# Patient Record
Sex: Female | Born: 1955
Health system: Southern US, Community
[De-identification: ages and names within clinical notes are randomized; demographics above are authoritative.]

## PROBLEM LIST (undated history)

## (undated) DIAGNOSIS — M1990 Unspecified osteoarthritis, unspecified site: Secondary | ICD-10-CM

## (undated) DIAGNOSIS — E785 Hyperlipidemia, unspecified: Secondary | ICD-10-CM

## (undated) DIAGNOSIS — I10 Essential (primary) hypertension: Secondary | ICD-10-CM

## (undated) DIAGNOSIS — I517 Cardiomegaly: Secondary | ICD-10-CM

## (undated) DIAGNOSIS — I5032 Chronic diastolic (congestive) heart failure: Secondary | ICD-10-CM

## (undated) DIAGNOSIS — J189 Pneumonia, unspecified organism: Secondary | ICD-10-CM

## (undated) DIAGNOSIS — E669 Obesity, unspecified: Secondary | ICD-10-CM

## (undated) DIAGNOSIS — D649 Anemia, unspecified: Secondary | ICD-10-CM

## (undated) DIAGNOSIS — R0600 Dyspnea, unspecified: Secondary | ICD-10-CM

## (undated) DIAGNOSIS — R002 Palpitations: Secondary | ICD-10-CM

## (undated) DIAGNOSIS — Z9289 Personal history of other medical treatment: Secondary | ICD-10-CM

## (undated) DIAGNOSIS — H8110 Benign paroxysmal vertigo, unspecified ear: Secondary | ICD-10-CM

## (undated) DIAGNOSIS — N183 Chronic kidney disease, stage 3 (moderate): Secondary | ICD-10-CM

## (undated) DIAGNOSIS — N186 End stage renal disease: Secondary | ICD-10-CM

## (undated) HISTORY — PX: ABDOMINAL HYSTERECTOMY: SHX81

## (undated) HISTORY — PX: TRIGGER FINGER RELEASE: SHX641

## (undated) HISTORY — DX: Palpitations: R00.2

## (undated) HISTORY — PX: TUBAL LIGATION: SHX77

## (undated) HISTORY — DX: Benign paroxysmal vertigo, unspecified ear: H81.10

## (undated) HISTORY — DX: Hyperlipidemia, unspecified: E78.5

## (undated) HISTORY — PX: DILATION AND CURETTAGE OF UTERUS: SHX78

## (undated) HISTORY — PX: EYE SURGERY: SHX253

## (undated) HISTORY — PX: KNEE SURGERY: SHX244

---

## 1991-10-08 DIAGNOSIS — E119 Type 2 diabetes mellitus without complications: Secondary | ICD-10-CM | POA: Insufficient documentation

## 1991-10-08 DIAGNOSIS — Z794 Long term (current) use of insulin: Secondary | ICD-10-CM

## 2000-10-28 ENCOUNTER — Encounter: Payer: Self-pay | Admitting: Family Medicine

## 2000-10-28 ENCOUNTER — Ambulatory Visit (HOSPITAL_COMMUNITY): Admission: RE | Admit: 2000-10-28 | Discharge: 2000-10-28 | Payer: Self-pay | Admitting: Family Medicine

## 2003-12-04 ENCOUNTER — Emergency Department (HOSPITAL_COMMUNITY): Admission: EM | Admit: 2003-12-04 | Discharge: 2003-12-04 | Payer: Self-pay | Admitting: Emergency Medicine

## 2003-12-29 ENCOUNTER — Other Ambulatory Visit: Admission: RE | Admit: 2003-12-29 | Discharge: 2003-12-29 | Payer: Self-pay | Admitting: Family Medicine

## 2005-01-03 ENCOUNTER — Ambulatory Visit (HOSPITAL_COMMUNITY): Admission: RE | Admit: 2005-01-03 | Discharge: 2005-01-03 | Payer: Self-pay | Admitting: Family Medicine

## 2005-03-06 ENCOUNTER — Ambulatory Visit (HOSPITAL_BASED_OUTPATIENT_CLINIC_OR_DEPARTMENT_OTHER): Admission: RE | Admit: 2005-03-06 | Discharge: 2005-03-06 | Payer: Self-pay | Admitting: Orthopedic Surgery

## 2005-03-06 ENCOUNTER — Ambulatory Visit (HOSPITAL_COMMUNITY): Admission: RE | Admit: 2005-03-06 | Discharge: 2005-03-06 | Payer: Self-pay | Admitting: Orthopedic Surgery

## 2006-05-26 ENCOUNTER — Other Ambulatory Visit: Admission: RE | Admit: 2006-05-26 | Discharge: 2006-05-26 | Payer: Self-pay | Admitting: Family Medicine

## 2008-07-05 ENCOUNTER — Emergency Department (HOSPITAL_COMMUNITY): Admission: EM | Admit: 2008-07-05 | Discharge: 2008-07-05 | Payer: Self-pay | Admitting: Emergency Medicine

## 2009-05-10 ENCOUNTER — Emergency Department (HOSPITAL_COMMUNITY): Admission: EM | Admit: 2009-05-10 | Discharge: 2009-05-10 | Payer: Self-pay | Admitting: Emergency Medicine

## 2009-05-11 ENCOUNTER — Emergency Department (HOSPITAL_COMMUNITY): Admission: EM | Admit: 2009-05-11 | Discharge: 2009-05-11 | Payer: Self-pay | Admitting: Emergency Medicine

## 2009-05-11 ENCOUNTER — Ambulatory Visit: Payer: Self-pay | Admitting: Vascular Surgery

## 2009-08-24 ENCOUNTER — Ambulatory Visit: Payer: Self-pay | Admitting: Family Medicine

## 2009-08-24 DIAGNOSIS — I10 Essential (primary) hypertension: Secondary | ICD-10-CM | POA: Insufficient documentation

## 2009-08-24 DIAGNOSIS — M25519 Pain in unspecified shoulder: Secondary | ICD-10-CM | POA: Insufficient documentation

## 2009-08-30 ENCOUNTER — Ambulatory Visit: Payer: Self-pay | Admitting: Family Medicine

## 2009-08-30 LAB — CONVERTED CEMR LAB
Cholesterol: 181 mg/dL (ref 0–200)
HDL: 41 mg/dL (ref >39)
Total CHOL/HDL Ratio: 4.4
VLDL: 16 mg/dL (ref 0–40)

## 2009-09-04 ENCOUNTER — Encounter (INDEPENDENT_AMBULATORY_CARE_PROVIDER_SITE_OTHER): Payer: Self-pay | Admitting: Family Medicine

## 2009-09-25 ENCOUNTER — Ambulatory Visit: Payer: Self-pay | Admitting: Family Medicine

## 2009-09-25 LAB — CONVERTED CEMR LAB
Alkaline Phosphatase: 93 units/L (ref 39–117)
BUN: 16 mg/dL (ref 6–23)
Basophils Absolute: 0 10*3/uL (ref 0.0–0.1)
Basophils Relative: 0 % (ref 0–1)
Blood Glucose, Fingerstick: 309
Creatinine, Ser: 0.81 mg/dL (ref 0.40–1.20)
Glucose, Bld: 277 mg/dL — ABNORMAL HIGH (ref 70–99)
Hgb A1c MFr Bld: 10.4 % — ABNORMAL HIGH (ref 4.6–6.1)
Lymphocytes Relative: 40 % (ref 12–46)
MCHC: 33.7 g/dL (ref 30.0–36.0)
Monocytes Absolute: 0.6 10*3/uL (ref 0.1–1.0)
Neutro Abs: 3.8 10*3/uL (ref 1.7–7.7)
Neutrophils Relative %: 50 % (ref 43–77)
Platelets: 316 10*3/uL (ref 150–400)
RDW: 13.5 % (ref 11.5–15.5)
Sodium: 139 meq/L (ref 135–145)
Total Bilirubin: 0.3 mg/dL (ref 0.3–1.2)
Total Protein: 8.2 g/dL (ref 6.0–8.3)
Vit D, 25-Hydroxy: 26 ng/mL — ABNORMAL LOW (ref 30–89)

## 2009-09-26 ENCOUNTER — Telehealth (INDEPENDENT_AMBULATORY_CARE_PROVIDER_SITE_OTHER): Payer: Self-pay | Admitting: Family Medicine

## 2009-09-27 ENCOUNTER — Encounter (INDEPENDENT_AMBULATORY_CARE_PROVIDER_SITE_OTHER): Payer: Self-pay | Admitting: Family Medicine

## 2009-10-03 ENCOUNTER — Encounter (INDEPENDENT_AMBULATORY_CARE_PROVIDER_SITE_OTHER): Payer: Self-pay | Admitting: *Deleted

## 2009-10-09 ENCOUNTER — Ambulatory Visit (HOSPITAL_COMMUNITY): Admission: RE | Admit: 2009-10-09 | Discharge: 2009-10-09 | Payer: Self-pay | Admitting: Family Medicine

## 2009-10-09 ENCOUNTER — Encounter (INDEPENDENT_AMBULATORY_CARE_PROVIDER_SITE_OTHER): Payer: Self-pay | Admitting: Nurse Practitioner

## 2009-10-13 ENCOUNTER — Ambulatory Visit: Payer: Self-pay | Admitting: Nurse Practitioner

## 2009-10-13 DIAGNOSIS — K5900 Constipation, unspecified: Secondary | ICD-10-CM | POA: Insufficient documentation

## 2009-10-13 DIAGNOSIS — Z862 Personal history of diseases of the blood and blood-forming organs and certain disorders involving the immune mechanism: Secondary | ICD-10-CM | POA: Insufficient documentation

## 2009-10-13 DIAGNOSIS — E559 Vitamin D deficiency, unspecified: Secondary | ICD-10-CM | POA: Insufficient documentation

## 2009-10-13 DIAGNOSIS — Z8639 Personal history of other endocrine, nutritional and metabolic disease: Secondary | ICD-10-CM

## 2009-10-13 LAB — CONVERTED CEMR LAB
Blood Glucose, Fingerstick: 243
Hep B C IgM: NEGATIVE

## 2009-10-24 ENCOUNTER — Encounter (INDEPENDENT_AMBULATORY_CARE_PROVIDER_SITE_OTHER): Payer: Self-pay | Admitting: Nurse Practitioner

## 2009-10-26 ENCOUNTER — Encounter (INDEPENDENT_AMBULATORY_CARE_PROVIDER_SITE_OTHER): Payer: Self-pay | Admitting: Nurse Practitioner

## 2009-10-27 ENCOUNTER — Encounter: Admission: RE | Admit: 2009-10-27 | Discharge: 2009-10-27 | Payer: Self-pay | Admitting: Family Medicine

## 2009-10-27 ENCOUNTER — Encounter (INDEPENDENT_AMBULATORY_CARE_PROVIDER_SITE_OTHER): Payer: Self-pay | Admitting: Family Medicine

## 2009-12-01 ENCOUNTER — Ambulatory Visit: Payer: Self-pay | Admitting: Nurse Practitioner

## 2009-12-01 LAB — CONVERTED CEMR LAB: Hgb A1c MFr Bld: 10.4 %

## 2009-12-04 ENCOUNTER — Encounter (INDEPENDENT_AMBULATORY_CARE_PROVIDER_SITE_OTHER): Payer: Self-pay | Admitting: Nurse Practitioner

## 2009-12-04 LAB — CONVERTED CEMR LAB
ALT: 51 units/L — ABNORMAL HIGH (ref 0–35)
AST: 30 units/L (ref 0–37)
Albumin: 4.5 g/dL (ref 3.5–5.2)
CO2: 24 meq/L (ref 19–32)
Calcium: 9.8 mg/dL (ref 8.4–10.5)
Chloride: 100 meq/L (ref 96–112)
Creatinine, Ser: 0.68 mg/dL (ref 0.40–1.20)
Potassium: 4.1 meq/L (ref 3.5–5.3)
Sodium: 137 meq/L (ref 135–145)
Total Protein: 7.7 g/dL (ref 6.0–8.3)

## 2010-02-09 ENCOUNTER — Ambulatory Visit: Payer: Self-pay | Admitting: Nurse Practitioner

## 2010-02-09 DIAGNOSIS — R252 Cramp and spasm: Secondary | ICD-10-CM | POA: Insufficient documentation

## 2010-02-09 DIAGNOSIS — K219 Gastro-esophageal reflux disease without esophagitis: Secondary | ICD-10-CM | POA: Insufficient documentation

## 2010-02-12 LAB — CONVERTED CEMR LAB
CO2: 26 meq/L (ref 19–32)
Calcium: 10.2 mg/dL (ref 8.4–10.5)
Creatinine, Ser: 0.74 mg/dL (ref 0.40–1.20)

## 2010-05-11 ENCOUNTER — Ambulatory Visit: Payer: Self-pay | Admitting: Nurse Practitioner

## 2010-05-11 LAB — CONVERTED CEMR LAB: Hgb A1c MFr Bld: 11.7 %

## 2010-06-01 ENCOUNTER — Telehealth (INDEPENDENT_AMBULATORY_CARE_PROVIDER_SITE_OTHER): Payer: Self-pay | Admitting: Nurse Practitioner

## 2010-06-20 ENCOUNTER — Telehealth (INDEPENDENT_AMBULATORY_CARE_PROVIDER_SITE_OTHER): Payer: Self-pay | Admitting: Nurse Practitioner

## 2010-07-26 ENCOUNTER — Ambulatory Visit: Payer: Self-pay | Admitting: Nurse Practitioner

## 2010-07-26 LAB — CONVERTED CEMR LAB: Blood Glucose, Fingerstick: 330

## 2010-10-28 ENCOUNTER — Encounter: Payer: Self-pay | Admitting: Family Medicine

## 2010-11-08 NOTE — Progress Notes (Signed)
Summary: Over Income  Phone Note Call from Patient   Caller: Patient Reason for Call: Talk to Doctor Summary of Call: Patient called to say she is now over income and therefore she cannot come here any more. She just wanted to say "thanks for being there" for her.... Initial call taken by: Lamont Snowball,  June 01, 2010 3:02 PM  Follow-up for Phone Call        How much over income is she? (nothing scanned in EMR for me to tell and i don't have IDX access) Does she have option of being a prompt pay pt is she wants to continue to be seen here?  Was she given that option? Follow-up by: [name] FNP,  June 04, 2010 8:05 AM  Additional Follow-up for Phone Call Additional follow up Details #1::        pt into the office today -she is aware of prompt payment Additional Follow-up by: [name] FNP,  July 26, 2010 2:13 PM

## 2010-11-08 NOTE — Assessment & Plan Note (Signed)
Summary: Diabetes   Vital Signs:  Patient profile:   55 year old female Menstrual status:  perimenopausal Weight:      236 pounds Temp:     97.6 degrees F Pulse rate:   84 / minute Pulse rhythm:   regular Resp:     18 per minute BP sitting:   144 / 80  (left arm) Cuff size:   large  Vitals Entered By: Rhunette Croft (July 26, 2010 2:02 PM) CC: DM FOLLOW. MED REFILLS NEED. FLU VAX AT [place], Abdominal Pain, Hypertension Management Is Patient Diabetic? Yes Pain Assessment Patient in pain? no      CBG Result 330 CBG Device ID [id number]  Does patient need assistance? Functional Status Self care Ambulation Normal   Primary Care Provider:  Dimas Millin  CC:  DM FOLLOW. MED REFILLS NEED. FLU VAX AT [employer], Abdominal Pain, and Hypertension Management.  History of Present Illness:  Pt into the office for f/u on diabetes.    Diabetes Management History:      The patient is a 55 years old female who comes in for evaluation of Type 2 Diabetes Mellitus.  She is (or has been) enrolled in the "Diabetic Education Program".  She states understanding of dietary principles and is following her diet appropriately.  No sensory loss is reported.  Self foot exams are not being performed.  She is checking home blood sugars.  She says that she is exercising.  Type of exercise includes: walking.  Duration of exercise is estimated to be 30 min.  She is doing this 3 times per week.        Hypoglycemic symptoms are not occurring.  No hyperglycemic symptoms are reported.  Other comments include: Pt has been taking her meds as ordered .        There are no symptoms to suggest diabetic complications.  No changes have been made to her treatment plan since last visit.  Treatment plan changes were none and initiated by D.E.P..    Dyspepsia History:      She has no alarm features of dyspepsia including no history of melena, hematochezia, dysphagia, persistent vomiting, or involuntary  weight loss > 5%.  There is a prior history of GERD.  The patient has a prior history of documented ulcer disease.  The dominant symptom is not heartburn or acid reflux.  An H-2 blocker medication is not currently being taken.  She has no history of a positive H. Pylori serology.  No previous upper endoscopy has been done.    Hypertension History:      She denies headache, chest pain, and palpitations.  She notes no problems with any antihypertensive medication side effects.        Positive major cardiovascular risk factors include diabetes and hypertension.  Negative major cardiovascular risk factors include female age less than 68 years old and non-tobacco-user status.        Further assessment for target organ damage reveals no history of ASHD, cardiac end-organ damage (CHF/LVH), stroke/TIA, peripheral vascular disease, renal insufficiency, or hypertensive retinopathy.       Current Medications (verified): 1)  Metformin Hcl 500 Mg Tabs (Metformin Hcl) .... Two Tablets By Mouth Two Times A Day 2)  Glucotrol Xl 10 Mg Xr24h-Tab (Glipizide) .... One Tablet By Mouth Two Times A Day 3)  Januvia 100 Mg Tabs (Sitagliptin Phosphate) .Marland Kitchen.. 1 By Mouth Daily 4)  Accuretic 20-25 Mg Tabs (Quinapril-Hydrochlorothiazide) .... One Tablet By Mouth Daily  For Blood Pressure 5)  Lantus Solostar 100 Unit/ml Soln (Insulin Glargine) .... 60  Units Subcutaneously Nightly For Diabetes 6)  Nexium 40 Mg Cpdr (Esomeprazole Magnesium) .... One Tablet By Mouth Daily Before Breakfast 7)  Orphenadrine Citrate Cr 100 Mg Xr12h-Tab (Orphenadrine Citrate) .... One Tablet By Mouth Night As Needed For Cramps 8)  Novolog 100 Unit/ml Soln (Insulin Aspart) .... 5 Units Subcutaneously Three Times A Day Before Meals For Blood Sugar  Allergies (verified): No Known Drug Allergies  Review of Systems General:  Denies sleep disorder. CV:  Denies chest pain or discomfort. Resp:  Denies cough. GI:  Denies abdominal pain, nausea, and  vomiting.  Physical Exam  General:  alert.   Head:  normocephalic.   Lungs:  normal breath sounds.   Heart:  normal rate and regular rhythm.   Abdomen:  normal bowel sounds.   Msk:  normal ROM.   Neurologic:  alert & oriented X3.   Skin:  color normal.   Psych:  Oriented X3.     Impression & Recommendations:  Problem # 1:  DIABETES MELLITUS, TYPE II (ICD-250.00) increase novolog to 10 units three times a day pt to restart metformin will check hgba1c at next visit Her updated medication list for this problem includes:    Metformin Hcl 500 Mg Tabs (Metformin hcl) .Marland Kitchen..Marland Kitchen Two tablets by mouth two times a day    Glucotrol Xl 10 Mg Xr24h-tab (Glipizide) ..... One tablet by mouth two times a day    Januvia 100 Mg Tabs (Sitagliptin phosphate) .Marland Kitchen... 1 by mouth daily    Accuretic 20-25 Mg Tabs (Quinapril-hydrochlorothiazide) ..... One tablet by mouth daily for blood pressure    Lantus Solostar 100 Unit/ml Soln (Insulin glargine) .Marland KitchenMarland KitchenMarland KitchenMarland Kitchen 60  units subcutaneously nightly for diabetes    Novolog 100 Unit/ml Soln (Insulin aspart) .Marland KitchenMarland KitchenMarland KitchenMarland Kitchen 10 units subcutaneously three times a day before meals for blood sugar  Orders: Capillary Blood Glucose/CBG (82948) Hemoglobin A1C [id number])  Problem # 2:  HYPERTENSION (ICD-401.9) BP elevated today - will recheck at next visit Her updated medication list for this problem includes:    Accuretic 20-25 Mg Tabs (Quinapril-hydrochlorothiazide) ..... One tablet by mouth daily for blood pressure    Norvasc 10 Mg Tabs (Amlodipine besylate) ..... One tablet by mouth daily for blood pressure  Problem # 3:  GASTROESOPHAGEAL REFLUX DISEASE (ICD-530.81)  Her updated medication list for this problem includes:    Nexium 40 Mg Cpdr (Esomeprazole magnesium) ..... One tablet by mouth daily before breakfast  Problem # 4:  OBESITY (ICD-278.00) advised to monitor exercise and monitor diet  Complete Medication List: 1)  Metformin Hcl 500 Mg Tabs (Metformin hcl) .... Two  tablets by mouth two times a day 2)  Glucotrol Xl 10 Mg Xr24h-tab (Glipizide) .... One tablet by mouth two times a day 3)  Januvia 100 Mg Tabs (Sitagliptin phosphate) .Marland Kitchen.. 1 by mouth daily 4)  Accuretic 20-25 Mg Tabs (Quinapril-hydrochlorothiazide) .... One tablet by mouth daily for blood pressure 5)  Lantus Solostar 100 Unit/ml Soln (Insulin glargine) .... 60  units subcutaneously nightly for diabetes 6)  Nexium 40 Mg Cpdr (Esomeprazole magnesium) .... One tablet by mouth daily before breakfast 7)  Novolog 100 Unit/ml Soln (Insulin aspart) .Marland Kitchen.. 10 units subcutaneously three times a day before meals for blood sugar 8)  Norvasc 10 Mg Tabs (Amlodipine besylate) .... One tablet by mouth daily for blood pressure  Diabetes Management Assessment/Plan:      Her blood pressure goal is < 130/80.  Hypertension Assessment/Plan:      The patient's hypertensive risk group is category C: Target organ damage and/or diabetes.  Her calculated 10 year risk of coronary heart disease is 24 %.  Today's blood pressure is 144/80.  Her blood pressure goal is < 130/80.  Patient Instructions: 1)  Increase novolog to 10units subcutaneously three times a day. 2)  Continue lantus 60 units subcutaneously nightly. 3)  Schedule a lab visit in 2 months (nurse visit) Come fasting before this appointment. 4)  You will need u/a, cbg, Hgba1c and blood pressure check. 5)  Check with n.martin,fnp before drawing cbc, lipids, TSH, microalbumin Prescriptions: NOVOLOG 100 UNIT/ML SOLN (INSULIN ASPART) 10 units subcutaneously three times a day before meals for blood sugar  #1 month qs x 3   Entered and Authorized by:   [name] FNP   Signed by:   [name] FNP on 07/26/2010   Method used:   Print then Give to Patient   RxID:   [id number] METFORMIN HCL 500 MG TABS (METFORMIN HCL) Two tablets by mouth two times a day  #145ml x 5   Entered and Authorized by:   [name] FNP   Signed by:   [name]  FNP on 07/26/2010   Method used:   Print then Give to Patient   RxID:   [id number]    Orders Added: 1)  Est. Patient Level III [id number] 2)  Capillary Blood Glucose/CBG [82948] 3)  Hemoglobin A1C [83036]

## 2010-11-08 NOTE — Letter (Signed)
Summary: PT INFORMATION SHEET  PT INFORMATION SHEET   Imported By: Roland Earl 10/23/2009 15:43:21  _____________________________________________________________________  External Attachment:    Type:   Image     Comment:   External Document

## 2010-11-08 NOTE — Assessment & Plan Note (Signed)
Summary: Diabetes/HTN   Vital Signs:  Patient profile:   55 year old female Menstrual status:  perimenopausal Weight:      241.4 pounds BMI:     40.32 BSA:     2.14 Temp:     98.2 degrees F oral Pulse rate:   85 / minute Pulse rhythm:   regular Resp:     20 per minute BP sitting:   135 / 81  (left arm) Cuff size:   large  Vitals Entered By: Isla Pence (Feb 09, 2010 3:25 PM)  Nutrition Counseling: Patient's BMI is greater than 25 and therefore counseled on weight management options. CC: follow-up visit DM....real bad leg cramps nightly x 2 weeks, Hypertension Management Is Patient Diabetic? Yes Pain Assessment Patient in pain? no      CBG Result 193 CBG Device ID A  Does patient need assistance? Functional Status Self care Ambulation Normal   Primary Care Provider:  Dimas Millin  CC:  follow-up visit DM....real bad leg cramps nightly x 2 weeks and Hypertension Management.  History of Present Illness:  Pt into the office with complaints of cramps in her legs started 2 weeks ago only at night; no cramps noticed at night Has started taking "pepper" at night to help with cramps still taking accuretic but is not taking HCTZ anymore no potassium rich foods  Obesity - overweight and pt had discussed this with the provider she has joined a gym and is going three times per day.  3 pound weight GAIN since last visit  Indigestion - more in the past month than when ever before Infrequent tomato sauce no ETOH no tobacco Admits to drinkiing some diet Mt. Dew or soda  Diabetes Management History:      The patient is a 55 years old female who comes in for evaluation of Type 2 Diabetes Mellitus.  She is (or has been) enrolled in the "Diabetic Education Program".  She states lack of understanding of dietary principles and is not following her diet appropriately.  No sensory loss is reported.  Self foot exams are not being performed.  She is checking home blood sugars.  She says  that she is exercising.  Type of exercise includes: walking.  Duration of exercise is estimated to be 30 min.  She is doing this 3 times per week.        Hypoglycemic symptoms are not occurring.  No hyperglycemic symptoms are reported.  Other comments include: checks blood sugar three times per week.    Hypertension History:      She denies headache, chest pain, and palpitations.  She notes no problems with any antihypertensive medication side effects.        Positive major cardiovascular risk factors include diabetes and hypertension.  Negative major cardiovascular risk factors include female age less than 33 years old and non-tobacco-user status.        Further assessment for target organ damage reveals no history of ASHD, cardiac end-organ damage (CHF/LVH), stroke/TIA, peripheral vascular disease, renal insufficiency, or hypertensive retinopathy.      Habits & Providers  Alcohol-Tobacco-Diet     Alcohol drinks/day: 0     Tobacco Status: never  Exercise-Depression-Behavior     Does Patient Exercise: yes     Exercise Counseling: to improve exercise regimen     Type of exercise: walking     Exercise (avg: min/session): 30 min.     Times/week: 3     Depression Counseling: not indicated; screening negative for  depression     Drug Use: no  Allergies (verified): No Known Drug Allergies  Review of Systems CV:  Denies chest pain or discomfort. Resp:  Denies cough. GI:  Complains of indigestion. MS:  Complains of joint pain; right shoulder .  Physical Exam  General:  alert.  obese Head:  normocephalic.   Lungs:  normal breath sounds.   Heart:  normal rate and regular rhythm.   Msk:  up the exam table Neurologic:  alert & oriented X3.   Skin:  color normal.   Psych:  Oriented X3.    Diabetes Management Exam:       Nails:          Left foot: thickened          Right foot: thickened   Impression & Recommendations:  Problem # 1:  DIABETES MELLITUS, TYPE II  (ICD-250.00) continue current meds will check Hgba1c on next visit pt is only checking blood sugars about three times per week due to finances Her updated medication list for this problem includes:    Metformin Hcl 500 Mg Tabs (Metformin hcl) .Marland Kitchen..Marland Kitchen Two tablets by mouth two times a day    Glucotrol Xl 10 Mg Xr24h-tab (Glipizide) ..... One tablet by mouth two times a day    Januvia 100 Mg Tabs (Sitagliptin phosphate) .Marland Kitchen... 1 by mouth daily    Accuretic 20-25 Mg Tabs (Quinapril-hydrochlorothiazide) ..... One tablet by mouth daily for blood pressure    Lantus Solostar 100 Unit/ml Soln (Insulin glargine) .Marland KitchenMarland KitchenMarland KitchenMarland Kitchen 60  units subcutaneously nightly for diabetes  Orders: Capillary Blood Glucose/CBG [id number])  Her updated medication list for this problem includes:    Metformin Hcl 500 Mg Tabs (Metformin hcl) .Marland Kitchen..Marland Kitchen Two tablets by mouth two times a day    Glucotrol Xl 10 Mg Xr24h-tab (Glipizide) ..... One tablet by mouth two times a day    Januvia 100 Mg Tabs (Sitagliptin phosphate) .Marland Kitchen... 1 by mouth daily    Accuretic 20-25 Mg Tabs (Quinapril-hydrochlorothiazide) ..... One tablet by mouth daily for blood pressure    Lantus Solostar 100 Unit/ml Soln (Insulin glargine) .Marland KitchenMarland KitchenMarland KitchenMarland Kitchen 60  units subcutaneously nightly for diabetes  Problem # 2:  HYPERTENSION (ICD-401.9) Bp is stable continue current meds. rec DASH diet The following medications were removed from the medication list:    Hydrochlorothiazide 25 Mg Tabs (Hydrochlorothiazide) .Marland Kitchen... 1/2 tablet by mouth daily Her updated medication list for this problem includes:    Accuretic 20-25 Mg Tabs (Quinapril-hydrochlorothiazide) ..... One tablet by mouth daily for blood pressure    Norvasc 10 Mg Tabs (Amlodipine besylate) ..... One tablet by mouth daily for blood pressure  Problem # 3:  OBESITY (ICD-278.00) advised pt that she needs to really monitor her portion size maintain GYM membership  Problem # 4:  GASTROESOPHAGEAL REFLUX DISEASE (ICD-530.81) handout  given regarding foods to avoid will give omeprazole before breakfast Her updated medication list for this problem includes:    Omeprazole 20 Mg Cpdr (Omeprazole) ..... One tablet by mouth daily before breakfast  Problem # 5:  LEG CRAMPS (ICD-729.82) will check potassium  Orders: T-Basic Metabolic Panel (99991111)  Complete Medication List: 1)  Metformin Hcl 500 Mg Tabs (Metformin hcl) .... Two tablets by mouth two times a day 2)  Glucotrol Xl 10 Mg Xr24h-tab (Glipizide) .... One tablet by mouth two times a day 3)  Januvia 100 Mg Tabs (Sitagliptin phosphate) .Marland Kitchen.. 1 by mouth daily 4)  Accuretic 20-25 Mg Tabs (Quinapril-hydrochlorothiazide) .... One tablet by mouth daily for  blood pressure 5)  Norvasc 10 Mg Tabs (Amlodipine besylate) .... One tablet by mouth daily for blood pressure 6)  Lantus Solostar 100 Unit/ml Soln (Insulin glargine) .... 60  units subcutaneously nightly for diabetes 7)  Omeprazole 20 Mg Cpdr (Omeprazole) .... One tablet by mouth daily before breakfast  Diabetes Management Assessment/Plan:      Her blood pressure goal is < 130/80.    Hypertension Assessment/Plan:      The patient's hypertensive risk group is category C: Target organ damage and/or diabetes.  Her calculated 10 year risk of coronary heart disease is 17 %.  Today's blood pressure is 135/81.  Her blood pressure goal is < 130/80.  Diabetic Foot Exam Foot Inspection Is there a history of a foot ulcer?              No Is there a foot ulcer now?              No Can the patient see the bottom of their feet?          No Are the shoes appropriate in style and fit?          Yes Is there swelling or an abnormal foot shape?          Yes Are the toenails long?                No Are the toenails thick?                No Are the toenails ingrown?              No Is there heavy callous build-up?              No Is there pain in the calf muscle (Intermittent claudication) when walking?    No Diabetic Foot Care  Education   Patient Instructions: 1)  Your potassium will be checked today to see if this is the cause of leg cramps.  Try to put soap in bed over the weekend. 2)  indigestion - read the handout to be sure you are avoiding triggering foods. 3)  Keep in the gym and remember your targets for blood pressure and blood sugar 4)  Follow up in 2 months for diabetes. 5)  You will need Hba1c, cbg, pneumovax, aspirin Prescriptions: OMEPRAZOLE 20 MG CPDR (OMEPRAZOLE) One tablet by mouth daily before breakfast  #30 x 3   Entered and Authorized by:   [name] FNP   Signed by:   [name] FNP on 02/09/2010   Method used:   Print then Give to Patient   RxID:   [id number] ACCURETIC 20-25 MG TABS (QUINAPRIL-HYDROCHLOROTHIAZIDE) One tablet by mouth daily for blood pressure  #30 x 5   Entered and Authorized by:   [name] FNP   Signed by:   [name] FNP on 02/09/2010   Method used:   Print then Give to Patient   RxID:   [id number]

## 2010-11-08 NOTE — Letter (Signed)
Summary: *[facility] Results Follow up  [place], [place] [address]   Phone: [phone]  Fax: [phone]      10/24/2009   [name] [address] [place],   [address]   Dear  Ms. Latasha [name],                            ____S.Drinkard,FNP   ____D. Gore,FNP       ____B. McPherson,MD   ____V. Rankins,MD    ____E. Mulberry,MD    __X__N. Hassell Done, FNP  ____D. Jobe Igo, MD    ____K. Tomma Lightning, MD    ____Other     This letter is to inform you that your recent test(s):  _______Pap Smear    ___X____Lab Test     _______X-ray    ___X____ is within acceptable limits  _______ requires a medication change  _______ requires a follow-up lab visit  _______ requires a follow-up visit with your provider   Comments: Follow up labs done during most recent office visit are normal.  You will continue to have your liver labs checked at follow up visits.       _________________________________________________________ If you have any questions, please contact our office [phone]                    Sincerely,    [name] FNP [facility]

## 2010-11-08 NOTE — Progress Notes (Signed)
Summary: Query:  Refill Lantus?  Phone Note Outgoing Call   Summary of Call: Refill request for Lantus Solostar -- do you want this refilled?  I saw the phone note for "over income" . Initial call taken by: Sherian Maroon RN,  June 20, 2010 11:42 AM  Follow-up for Phone Call        yes, ok to refill. I give pt 3-4 months after leaving to find another provider to take over refills Follow-up by: [name] FNP,  June 20, 2010 12:40 PM  Additional Follow-up for Phone Call Additional follow up Details #1::        Rx refilled -- Left message on answering machine for pt. to return call.  Sherian Maroon RN  June 20, 2010 3:55 PM  Left message on answering machine for pt. to return call.  Sherian Maroon RN  June 21, 2010 12:23 PM  Left message on answering machine for pt. to return call.  Sherian Maroon RN  June 22, 2010 10:13 AM     Prescriptions: LANTUS SOLOSTAR 100 UNIT/ML SOLN (INSULIN GLARGINE) 60  units subcutaneously nightly for diabetes  #45 ml x 4   Entered by:   Sherian Maroon RN   Authorized by:   [name] FNP   Signed by:   Sherian Maroon RN on 06/20/2010   Method used:   Historical   RxID:   [id number]

## 2010-11-08 NOTE — Assessment & Plan Note (Signed)
Summary: Diabetes   Vital Signs:  Patient profile:   55 year old female Menstrual status:  perimenopausal Weight:      231.4 pounds BMI:     38.65 BSA:     2.11 Temp:     97.9 degrees F oral Pulse rate:   97 / minute Pulse rhythm:   regular Resp:     20 per minute BP sitting:   136 / 84  (left arm)  Vitals Entered By: Isla Pence (October 13, 2009 2:37 PM) CC: follow-up visit from lab results..pain in right shoulder that has been going on for a year, Hypertension Management Is Patient Diabetic? Yes Pain Assessment Patient in pain? yes     Location: right shoulder CBG Result 243 CBG Device ID B  Does patient need assistance? Functional Status Self care Ambulation Normal     Menstrual Status perimenopausal   Primary Care Provider:  Dimas Millin  CC:  follow-up visit from lab results..pain in right shoulder that has been going on for a year and Hypertension Management.  History of Present Illness:  Pt into the office for f/u on diabetes. She recieved a letter from this office.  Right shoulder - recieved cortisone injections during last visit that has not helped. Some days are better than others with the right shoulder pain. Concerned because she has a plan to start exercise next week and is unsure how it will affect her. Denies any trauma to her arm  Hypertension History:      She denies headache, chest pain, and palpitations.  She notes no problems with any antihypertensive medication side effects.        Positive major cardiovascular risk factors include diabetes and hypertension.  Negative major cardiovascular risk factors include female age less than 33 years old and non-tobacco-user status.        Further assessment for target organ damage reveals no history of ASHD, cardiac end-organ damage (CHF/LVH), stroke/TIA, peripheral vascular disease, renal insufficiency, or hypertensive retinopathy.      Habits & Providers  Alcohol-Tobacco-Diet     Alcohol  drinks/day: 0     Tobacco Status: never  Exercise-Depression-Behavior     Does Patient Exercise: yes     Have you felt down or hopeless? no     Have you felt little pleasure in things? no     Depression Counseling: not indicated; screening negative for depression     Drug Use: no  Comments: Pt is going to start a fitness and wellness class 2-3 times per week.  Allergies (verified): No Known Drug Allergies  Review of Systems Resp:  Denies cough. GI:  Complains of constipation. MS:  right shoulder pain.  Physical Exam  General:  alert.  obese Head:  normocephalic.   Nose:  no nasal discharge.   Mouth:  pharynx pink and moist.   Lungs:  normal breath sounds.   Heart:  normal rate and regular rhythm.   Abdomen:  soft, non-tender, and normal bowel sounds.     Impression & Recommendations:  Problem # 1:  DIABETES MELLITUS, TYPE II (ICD-250.00) uncontrolled. will increase lantus to 55 and possibly to 60 on her next visit need to increase exercise change diet Her updated medication list for this problem includes:    Metformin Hcl 500 Mg Tabs (Metformin hcl) .Marland Kitchen... 2 tabs by mouth bid    Glucotrol Xl 10 Mg Xr24h-tab (Glipizide) .Marland Kitchen... 1 by mouth daily    Januvia 100 Mg Tabs (Sitagliptin phosphate) .Marland Kitchen... 1 by mouth  daily    Accuretic 20-12.5 Mg Tabs (Quinapril-hydrochlorothiazide) ..... One tablet by mouth daily    Lantus Solostar 100 Unit/ml Soln (Insulin glargine) .Marland KitchenMarland KitchenMarland KitchenMarland Kitchen 55  units subcutaneously for diabetes and increase to 60 units subcutaneously if uncontrolled  Orders: Capillary Blood Glucose/CBG [id number])  Problem # 2:  HYPERTENSION (ICD-401.9) stable Her updated medication list for this problem includes:    Accuretic 20-12.5 Mg Tabs (Quinapril-hydrochlorothiazide) ..... One tablet by mouth daily    Norvasc 10 Mg Tabs (Amlodipine besylate) .Marland Kitchen... 1 by mouth daily    Hydrochlorothiazide 25 Mg Tabs (Hydrochlorothiazide) .Marland Kitchen... 1/2 tablet by mouth daily  Problem # 3:   CONSTIPATION (ICD-564.00) advised pt to add fiber in his diet  Problem # 4:  LIVER FUNCTION TESTS, ABNORMAL, HX OF (ICD-V12.2) reviewed labs with pt Orders: T-Hepatitis Profile Acute [id number])  Complete Medication List: 1)  Metformin Hcl 500 Mg Tabs (Metformin hcl) .... 2 tabs by mouth bid 2)  Glucotrol Xl 10 Mg Xr24h-tab (Glipizide) .Marland Kitchen.. 1 by mouth daily 3)  Januvia 100 Mg Tabs (Sitagliptin phosphate) .Marland Kitchen.. 1 by mouth daily 4)  Accuretic 20-12.5 Mg Tabs (Quinapril-hydrochlorothiazide) .... One tablet by mouth daily 5)  Norvasc 10 Mg Tabs (Amlodipine besylate) .Marland Kitchen.. 1 by mouth daily 6)  Lantus Solostar 100 Unit/ml Soln (Insulin glargine) .... 55  units subcutaneously for diabetes and increase to 60 units subcutaneously if uncontrolled 7)  Hydrochlorothiazide 25 Mg Tabs (Hydrochlorothiazide) .... 1/2 tablet by mouth daily  Hypertension Assessment/Plan:      The patient's hypertensive risk group is category C: Target organ damage and/or diabetes.  Her calculated 10 year risk of coronary heart disease is 17 %.  Today's blood pressure is 136/84.  Her blood pressure goal is < 130/80.  Patient Instructions: 1)  Constipation - Use Fibercon daily to food to add fiber to the diet 2)  Diabetes - You will need to increase you lantus to 55 nightly. 3)  You need to check your blood sugar daily before breakfast.  4)  Increase to 60 units if majority of blood sugar checks are over 200. 5)  You will need to restart taking your multivitamin for vitamin D. 6)  Follow up in 6 weeks for diabetes check. 7)  Bring your blood sugar log into the office. 8)  Review constipation. Need Hgba1c, cmp

## 2010-11-08 NOTE — Assessment & Plan Note (Signed)
Summary: Diabetes/HTN   Vital Signs:  Patient profile:   55 year old female Menstrual status:  perimenopausal Weight:      238.8 pounds BMI:     39.88 BSA:     2.13 Temp:     97.8 degrees F oral Pulse rate:   78 / minute Pulse rhythm:   regular Resp:     20 per minute BP sitting:   140 / 70  (left arm) Cuff size:   large  Vitals Entered By: Isla Pence (December 01, 2009 2:55 PM) CC: follow-up visit DM, Hypertension Management Is Patient Diabetic? Yes CBG Result 192 CBG Device ID A  Does patient need assistance? Functional Status Self care Ambulation Normal   Primary Care Provider:  Dimas Millin  CC:  follow-up visit DM and Hypertension Management.  History of Present Illness:  Pt into the office for follow up on diabetes. She checkes her blood sugars at home and presents today with her blood sugar log. She is taking her metformin - 4 tablets all in the morning instead of divided doses.  Obesity - Pt is discouraged about the foods that she is eating. "i want something to curb my appetite" Weight is up 6 pounds since the last visit   Diabetes Management History:      The patient is a 55 years old female who comes in for evaluation of DM Type 2.  She is (or has been) enrolled in the "Diabetic Education Program".  She states lack of understanding of dietary principles and is not following her diet appropriately.  Sensory loss is noted.  Self foot exams are being performed.  She is not checking home blood sugars.  She says that she is exercising.        Hypoglycemic symptoms are not occurring.  No hyperglycemic symptoms are reported.        No changes have been made to her treatment plan since last visit.    Hypertension History:      She denies headache, chest pain, and palpitations.  She notes no problems with any antihypertensive medication side effects.        Positive major cardiovascular risk factors include diabetes and hypertension.  Negative major cardiovascular  risk factors include female age less than 12 years old and non-tobacco-user status.        Further assessment for target organ damage reveals no history of ASHD, cardiac end-organ damage (CHF/LVH), stroke/TIA, peripheral vascular disease, renal insufficiency, or hypertensive retinopathy.      Habits & Providers  Alcohol-Tobacco-Diet     Alcohol drinks/day: 0     Tobacco Status: never  Exercise-Depression-Behavior     Does Patient Exercise: yes     Exercise Counseling: to improve exercise regimen     Depression Counseling: not indicated; screening negative for depression     Drug Use: no  Comments: [employer] twice per week - circuit training  Allergies (verified): No Known Drug Allergies  Review of Systems General:  Denies fever. CV:  Denies chest pain or discomfort. Resp:  Denies cough. GI:  Complains of constipation; denies abdominal pain, nausea, and vomiting; still taking the fiber supplement.  Physical Exam  General:  alert.   Head:  normocephalic.   Lungs:  normal breath sounds.   Heart:  normal rate and regular rhythm.   Abdomen:  normal bowel sounds.   Msk:  up to the exam table Neurologic:  alert & oriented X3.    Diabetes Management Exam:    Foot Exam (  with socks and/or shoes not present):       Sensory-Monofilament:          Left foot: normal          Right foot: normal       Inspection:          Left foot: normal          Right foot: normal       Nails:          Left foot: too long          Right foot: too long   Impression & Recommendations:  Problem # 1:  DIABETES MELLITUS, TYPE II (ICD-250.00)  Advised pt to take meds in divided doses as ordered increase lantus to 60 units Her updated medication list for this problem includes:    Metformin Hcl 500 Mg Tabs (Metformin hcl) .Marland Kitchen..Marland Kitchen Two tablets by mouth two times a day    Glucotrol Xl 10 Mg Xr24h-tab (Glipizide) ..... One tablet by mouth two times a day    Januvia 100 Mg Tabs (Sitagliptin phosphate)  .Marland Kitchen... 1 by mouth daily    Accuretic 20-12.5 Mg Tabs (Quinapril-hydrochlorothiazide) ..... One tablet by mouth daily    Lantus Solostar 100 Unit/ml Soln (Insulin glargine) .Marland KitchenMarland KitchenMarland KitchenMarland Kitchen 60  units subcutaneously nightly for diabetes  Orders: Capillary Blood Glucose/CBG ([id number]) Hemoglobin A1C [id number])  Problem # 2:  HYPERTENSION (ICD-401.9) DASH diet Her updated medication list for this problem includes:    Accuretic 20-12.5 Mg Tabs (Quinapril-hydrochlorothiazide) ..... One tablet by mouth daily    Norvasc 10 Mg Tabs (Amlodipine besylate) ..... One tablet by mouth daily for blood pressure    Hydrochlorothiazide 25 Mg Tabs (Hydrochlorothiazide) .Marland Kitchen... 1/2 tablet by mouth daily  Problem # 3:  OBESITY (ICD-278.00) advised pt to continue wieght loss efforts  Problem # 4:  LIVER FUNCTION TESTS, ABNORMAL, HX OF (ICD-V12.2) will recheck labs today Orders: T-Comprehensive Metabolic Panel ([id number])  Complete Medication List: 1)  Metformin Hcl 500 Mg Tabs (Metformin hcl) .... Two tablets by mouth two times a day 2)  Glucotrol Xl 10 Mg Xr24h-tab (Glipizide) .... One tablet by mouth two times a day 3)  Januvia 100 Mg Tabs (Sitagliptin phosphate) .Marland Kitchen.. 1 by mouth daily 4)  Accuretic 20-12.5 Mg Tabs (Quinapril-hydrochlorothiazide) .... One tablet by mouth daily 5)  Norvasc 10 Mg Tabs (Amlodipine besylate) .... One tablet by mouth daily for blood pressure 6)  Lantus Solostar 100 Unit/ml Soln (Insulin glargine) .... 60  units subcutaneously nightly for diabetes 7)  Hydrochlorothiazide 25 Mg Tabs (Hydrochlorothiazide) .... 1/2 tablet by mouth daily  Diabetes Management Assessment/Plan:      Her blood pressure goal is < 130/80.    Hypertension Assessment/Plan:      The patient's hypertensive risk group is category C: Target organ damage and/or diabetes.  Her calculated 10 year risk of coronary heart disease is 24 %.  Today's blood pressure is 140/70.  Her blood pressure goal is < 130/80.  Patient  Instructions: 1)  Weight loss - Continue to go to the [facility] twice per week. 2)  At least 2 other days per week you need to increase your activity with .... 3)  **Bike 4)  **Walk on the track 5)  **Some continuous activity for at least 15-20 minutes 6)  You should also eat foods that are LOW in calories such as celery, yogurt, cherrios.  Snacks should be no more than 100 calories. 7)  Your calorie intake should be 1800 per day -  allow 3 - 100 calorie snacks and 500 per meal.  8)  Goal should be 1 pound per week 9)  Drinks should be crystal light - individual packets. 10)  Follow up in 6 weeks for diabetes.  If blood pressure is still high will need to change meds Prescriptions: GLUCOTROL XL 10 MG XR24H-TAB (GLIPIZIDE) One tablet by mouth two times a day  #60 x 5   Entered and Authorized by:   [name] FNP   Signed by:   [name] FNP on 12/01/2009   Method used:   Print then Give to Patient   RxID:   [id number] METFORMIN HCL 500 MG TABS (METFORMIN HCL) Two tablets by mouth two times a day  #147ml x 5   Entered and Authorized by:   [name] FNP   Signed by:   [name] FNP on 12/01/2009   Method used:   Print then Give to Patient   [id number] HYDROCHLOROTHIAZIDE 25 MG TABS (HYDROCHLOROTHIAZIDE) 1/2 tablet by mouth daily  #45 x 1   Entered and Authorized by:   [name] FNP   Signed by:   [name] FNP on 12/01/2009   Method used:   Print then Give to Patient   RxID:   [id number] [place] 10 MG TABS (AMLODIPINE BESYLATE) One tablet by mouth daily for blood pressure  #30 x 5   Entered and Authorized by:   [name] FNP   Signed by:   [name] FNP on 12/01/2009   Method used:   Print then Give to Patient   RxID:   [id number]    Last LDL:                                                 124 (08/30/2009 9:42:00 PM)        Diabetic Foot Exam    10-g (5.07) Semmes-Weinstein Monofilament Test Performed  by: Isla Pence          Right Foot          Left Foot Visual Inspection               Test Control      normal         normal Site 1         normal         normal Site 2         normal         normal Site 3         normal         normal Site 4         normal         normal Site 5         normal         normal Site 6         normal         normal Site 7         normal         normal Site 8         normal         normal Site 9         normal         normal Site 10  normal         normal  Impression      normal         normal   Laboratory Results   Blood Tests   Date/Time Received: December 01, 2009 3:11 PM  Date/Time Reported: December 01, 2009 3:11 PM   HGBA1C: 10.4%   (Normal Range: Non-Diabetic - 3-6%   Control Diabetic - 6-8%) CBG Random:: 192

## 2010-11-08 NOTE — Miscellaneous (Signed)
Summary: Mammogram Results  Clinical Lists Changes  Observations: Added new observation of MAMMOGRAM: Possible mass, right breast.  Additional evaluation is indicated. (10/09/2009 8:32)      Mammogram  Procedure date:  10/09/2009  Findings:      Possible mass, right breast.  Additional evaluation is indicated.   Mammogram  Procedure date:  10/09/2009  Findings:      Possible mass, right breast.  Additional evaluation is indicated.

## 2010-11-08 NOTE — Letter (Signed)
Summary: *[facility] Results Follow up  [place], [place] [address]   Phone: [phone]  Fax: [phone]      12/04/2009   Latasha [name] [address] [place], [place]  [address]   Dear  [name],                            ____S.Drinkard,FNP   ____D. Gore,FNP       ____B. McPherson,MD   ____V. Rankins,MD    ____E. Mulberry,MD    __X__N. Hassell Done, FNP  ____D. Jobe Igo, MD    ____K. Tomma Lightning, MD    ____Other     This letter is to inform you that your recent test(s):  _______Pap Smear    ___X____Lab Test     _______X-ray    Comments:  As previously mentioned, your liver enzymes are slightly elevated.  However, it is coming down from previous check.  This lab will continue to be monitored.       _________________________________________________________ If you have any questions, please contact our office [phone].                    Sincerely,   [name] FNP [facility]

## 2010-11-08 NOTE — Assessment & Plan Note (Signed)
Summary: Diabetes/HTN   Vital Signs:  Patient profile:   55 year old female Menstrual status:  perimenopausal Weight:      238.6 pounds BMI:     39.85 Temp:     98.4 degrees F oral Pulse rate:   98 / minute Pulse rhythm:   regular Resp:     20 per minute BP sitting:   157 / 89  (left arm) Cuff size:   large  Vitals Entered By: Isla Pence (May 11, 2010 10:34 AM)  Nutrition Counseling: Patient's BMI is greater than 25 and therefore counseled on weight management options. CC: Dm check, Hypertension Management Is Patient Diabetic? Yes Pain Assessment Patient in pain? no      CBG Result 229 CBG Device ID B  Does patient need assistance? Functional Status Self care Ambulation Normal   Primary Care Provider:  Dimas Millin  CC:  Dm check and Hypertension Management.  History of Present Illness:  Pt into the office for diabetes   Obesity - Down 3 pounds since her last visit. Pt admits that she has a habit of eating.  "I can't go past my kitchen without eating"  "For as long as I can remember food as controlled me"  Mostly the eating occurs when she is at home.  Social - pt was employed at [employer] for 30 years.  She is now in school and is starting a second career.  Diabetes Management History:      The patient is a 55 years old female who comes in for evaluation of Type 2 Diabetes Mellitus.  She is (or has been) enrolled in the "Diabetic Education Program".  She states understanding of dietary principles but she is not following the appropriate diet.  No sensory loss is reported.  Self foot exams are not being performed.  She is checking home blood sugars.  She says that she is exercising.  Type of exercise includes: walking.  Duration of exercise is estimated to be 30 min.  She is doing this 3 times per week.        Hypoglycemic symptoms are not occurring.  No hyperglycemic symptoms are reported.  Other comments include: Pt is not able to check her blood sugar due to  finances.  She is not able to afford the test strips.    Hypertension History:      She denies headache, chest pain, and palpitations.  She notes no problems with any antihypertensive medication side effects.        Positive major cardiovascular risk factors include diabetes and hypertension.  Negative major cardiovascular risk factors include female age less than 35 years old and non-tobacco-user status.        Further assessment for target organ damage reveals no history of ASHD, cardiac end-organ damage (CHF/LVH), stroke/TIA, peripheral vascular disease, renal insufficiency, or hypertensive retinopathy.     Medications Prior to Update: 1)  Metformin Hcl 500 Mg Tabs (Metformin Hcl) .... Two Tablets By Mouth Two Times A Day 2)  Glucotrol Xl 10 Mg Xr24h-Tab (Glipizide) .... One Tablet By Mouth Two Times A Day 3)  Januvia 100 Mg Tabs (Sitagliptin Phosphate) .Marland Kitchen.. 1 By Mouth Daily 4)  Accuretic 20-25 Mg Tabs (Quinapril-Hydrochlorothiazide) .... One Tablet By Mouth Daily For Blood Pressure 5)  Lantus Solostar 100 Unit/ml Soln (Insulin Glargine) .... 60  Units Subcutaneously Nightly For Diabetes 6)  Omeprazole 20 Mg Cpdr (Omeprazole) .... One Tablet By Mouth Daily Before Breakfast 7)  Orphenadrine Citrate Cr 100 Mg  Xr12h-Tab (Orphenadrine Citrate) .... One Tablet By Mouth Night As Needed For Cramps  Allergies (verified): No Known Drug Allergies  Review of Systems General:  Denies loss of appetite. CV:  Denies chest pain or discomfort. Resp:  Denies cough. GI:  Denies abdominal pain, nausea, and vomiting. MS:  Denies cramps; Pt used the bar of soap in her bed for the leg cramps and they have resolved.  . Neuro:  Denies headaches.  Physical Exam  General:  alert.  obese Head:  normocephalic.   Ears:  ear piercing(s) noted.   Lungs:  normal breath sounds.   Heart:  normal rate and regular rhythm.   Abdomen:  soft, non-tender, and normal bowel sounds.   Msk:  up to the exam Neurologic:   alert & oriented X3.   Skin:  color normal.   Psych:  Oriented X3.    Diabetes Management Exam:    Foot Exam (with socks and/or shoes not present):       Sensory-Monofilament:          Left foot: normal          Right foot: normal  Diabetic Foot Exam Foot Inspection Is there a history of a foot ulcer?              No Is there a foot ulcer now?              No Can the patient see the bottom of their feet?          No Are the shoes appropriate in style and fit?          Yes Is there swelling or an abnormal foot shape?          Yes Are the toenails long?                Yes Are the toenails thick?                Yes Are the toenails ingrown?              No Is there heavy callous build-up?              No Is there pain in the calf muscle (Intermittent claudication) when walking?    NoIs there a claw toe deformity?              No Is there elevated skin temperature?            No Is there limited ankle dorsiflexion?            No Is there foot or ankle muscle weakness?            No  Diabetic Foot Care Education Patient educated on appropriate care of diabetic feet.  Pulse Check          Right Foot          Left Foot Dorsalis Pedis:        normal            normal    10-g (5.07) Semmes-Weinstein Monofilament Test Performed by: Isla Pence          Right Foot          Left Foot Visual Inspection                 Impression & Recommendations:  Problem # 1:  DIABETES MELLITUS, TYPE II (ICD-250.00) Hgba1c = 11.7 Still uncontrolled. will add novolog insulin Her updated medication  list for this problem includes:    Metformin Hcl 500 Mg Tabs (Metformin hcl) .Marland Kitchen..Marland Kitchen Two tablets by mouth two times a day    Glucotrol Xl 10 Mg Xr24h-tab (Glipizide) ..... One tablet by mouth two times a day    Januvia 100 Mg Tabs (Sitagliptin phosphate) .Marland Kitchen... 1 by mouth daily    Accuretic 20-25 Mg Tabs (Quinapril-hydrochlorothiazide) ..... One tablet by mouth daily for blood pressure    Lantus  Solostar 100 Unit/ml Soln (Insulin glargine) .Marland KitchenMarland KitchenMarland KitchenMarland Kitchen 60  units subcutaneously nightly for diabetes    Novolog 100 Unit/ml Soln (Insulin aspart) .Marland KitchenMarland KitchenMarland KitchenMarland Kitchen 5 units subcutaneously three times a day before meals for blood sugar  Orders: Capillary Blood Glucose/CBG (82948) Hemoglobin A1C [id number])  Problem # 2:  HYPERTENSION (ICD-401.9) BP is slightly elevated today. Her updated medication list for this problem includes:    Accuretic 20-25 Mg Tabs (Quinapril-hydrochlorothiazide) ..... One tablet by mouth daily for blood pressure  Problem # 3:  OBESITY (ICD-278.00) down 3 pounds since her last visit  Complete Medication List: 1)  Metformin Hcl 500 Mg Tabs (Metformin hcl) .... Two tablets by mouth two times a day 2)  Glucotrol Xl 10 Mg Xr24h-tab (Glipizide) .... One tablet by mouth two times a day 3)  Januvia 100 Mg Tabs (Sitagliptin phosphate) .Marland Kitchen.. 1 by mouth daily 4)  Accuretic 20-25 Mg Tabs (Quinapril-hydrochlorothiazide) .... One tablet by mouth daily for blood pressure 5)  Lantus Solostar 100 Unit/ml Soln (Insulin glargine) .... 60  units subcutaneously nightly for diabetes 6)  Omeprazole 20 Mg Cpdr (Omeprazole) .... One tablet by mouth daily before breakfast 7)  Orphenadrine Citrate Cr 100 Mg Xr12h-tab (Orphenadrine citrate) .... One tablet by mouth night as needed for cramps 8)  Novolog 100 Unit/ml Soln (Insulin aspart) .... 5 units subcutaneously three times a day before meals for blood sugar  Diabetes Management Assessment/Plan:      Her blood pressure goal is < 130/80.    Hypertension Assessment/Plan:      The patient's hypertensive risk group is category C: Target organ damage and/or diabetes.  Her calculated 10 year risk of coronary heart disease is 24 %.  Today's blood pressure is 157/89.  Her blood pressure goal is < 130/80.  Patient Instructions: 1)  Blood pressure is slightly elevated today.  Be sure to monitor your sodium intake. 2)  Read handout about weight loss. Perhaps you can  find a class to attend to help food control 3)  Blood sugar is going up instead of down.  You will need to start on additional insulin to take during the day to lower blood sugar.   4)  Novolog - 5 units subcutaneously three times a day before meals 5)  Follow up in 2 weeks for diabetes 6)  will need cbg Prescriptions: NOVOLOG 100 UNIT/ML SOLN (INSULIN ASPART) 5 units subcutaneously three times a day before meals for blood sugar  #1 x 0   Entered and Authorized by:   [name] FNP   Signed by:   [name] FNP on 05/11/2010   Method used:   Samples Given   RxID:   [id number]    Last LDL:                                                 124 (08/30/2009 9:42:00 PM)  Diabetic Foot Exam Diabetic Foot Care Education :Patient educated on appropriate care of diabetic feet.  Pulse Check          Right Foot          Left Foot Dorsalis Pedis:        normal            normal    10-g (5.07) Semmes-Weinstein Monofilament Test Performed by: Isla Pence          Right Foot          Left Foot Visual Inspection               Test Control      normal         normal Site 1         normal         normal Site 2         normal         normal Site 3         normal         normal Site 4         normal         normal Site 5         normal         normal Site 6         normal         normal Site 7         normal         normal Site 8         normal         normal Site 9         normal         normal Site 10         normal         normal  Impression      normal         normal  Prevention & Chronic Care Immunizations   Influenza vaccine: historical per pt  (09/06/2009)    Tetanus booster: Not documented    Pneumococcal vaccine: Not documented   Pneumococcal vaccine due: 01/18/2022  Colorectal Screening   Hemoccult: Not documented    Colonoscopy: Not documented  Other Screening   Pap smear: Not documented    Mammogram: Possible mass, right breast.   Additional evaluation is indicated.  (10/09/2009)   Smoking status: never  (02/09/2010)  Diabetes Mellitus   HgbA1C: 11.7  (05/11/2010)    Eye exam: Not documented    Foot exam: yes  (05/11/2010)   High risk foot: Not documented   Foot care education: Done  (05/11/2010)    Urine microalbumin/creatinine ratio: Not documented  Lipids   Total Cholesterol: 181  (08/30/2009)   LDL: 124  (08/30/2009)   LDL Direct: Not documented   HDL: 41  (08/30/2009)   Triglycerides: 81  (08/30/2009)  Hypertension   Last Blood Pressure: 157 / 89  (05/11/2010)   Serum creatinine: 0.74  (02/09/2010)   Serum potassium 4.7  (02/09/2010)  Self-Management Support :    Diabetes self-management support: Not documented    Hypertension self-management support: Not documented   Laboratory Results   Blood Tests   Date/Time Received: May 11, 2010 11:41 AM   HGBA1C: 11.7%   (Normal Range: Non-Diabetic - 3-6%   Control Diabetic - 6-8%) CBG Random:: 229mg /dL

## 2010-11-08 NOTE — Letter (Signed)
Summary: BLOOD GLUCOSE READING/LAST 37 MORNING  BLOOD GLUCOSE READING/LAST 37 MORNING   Imported By: Roland Earl 12/04/2009 10:01:13  _____________________________________________________________________  External Attachment:    Type:   Image     Comment:   External Document

## 2010-12-12 ENCOUNTER — Encounter (INDEPENDENT_AMBULATORY_CARE_PROVIDER_SITE_OTHER): Payer: Self-pay | Admitting: Nurse Practitioner

## 2010-12-12 ENCOUNTER — Encounter: Payer: Self-pay | Admitting: Nurse Practitioner

## 2010-12-12 DIAGNOSIS — M5430 Sciatica, unspecified side: Secondary | ICD-10-CM | POA: Insufficient documentation

## 2010-12-14 ENCOUNTER — Telehealth (INDEPENDENT_AMBULATORY_CARE_PROVIDER_SITE_OTHER): Payer: Self-pay | Admitting: Nurse Practitioner

## 2010-12-18 NOTE — Progress Notes (Signed)
Summary: WANTS NEW MEDS  Phone Note Call from Patient Call back at [phone]   Reason for Call: Talk to Doctor Summary of Call: PT SAID THE PAIN MEDICATION THAT WAS GIVEN TO HER ON 12/12/10 ONLY WORKED THAT DAY. PT SAID PLS PRESCRIBE HER SOMETHING ELSE BECAUSE THE PAIN IS UNBEARABLE.  PT CALLEDBK SAID SHE NEEDS TO STEP OUT AND TO CALL HER ON HER CELL# [phone]. Initial call taken by: Daralene Milch,  December 14, 2010 8:57 AM  Follow-up for Phone Call        Left message on answering machine for pt. to return call.  Sent to N. Hassell Done.  Sherian Maroon RN  December 14, 2010 9:06 AM   Additional Follow-up for Phone Call Additional follow up Details #1::        she should continue on prednisone taper as advised because this will help with inflammation Will give gabapentin 300mg  to take and night. will also give a few vicodin as needed (rx in basket) If pain is still this severe by the beginning of next week she may need imaging Advise pt where [place] outpt pharmacy is so she can get narc there at low cost.  they may fill both (she can try) but if not they will at least fill the vicodin Additional Follow-up by: [name] FNP,  December 14, 2010 9:12 AM    Additional Follow-up for Phone Call Additional follow up Details #2::    Pt. advised of provider's instructions and new Rx -- verbalized understanding and agreement.   Will come to office to pick up Rx.  Directions re [place] enclosed with Rx per pt. request.  Sherian Maroon RN  December 14, 2010 12:03 PM   New/Updated Medications: GABAPENTIN 300 MG CAPS (GABAPENTIN) One tablet by mouth nightly VICODIN 5-500 MG TABS (HYDROCODONE-ACETAMINOPHEN) One tablet by mouth daily as needed for pain Prescriptions: VICODIN 5-500 MG TABS (HYDROCODONE-ACETAMINOPHEN) One tablet by mouth daily as needed for pain  #20 x 0   Entered and Authorized by:   [name] FNP   Signed by:   [name] FNP on 12/14/2010   Method used:   Printed then  faxed to ...       [facility] (retail)       [address]       [place], [place]  [address]       Ph: [phone]       Fax: [phone]   RxID:   [id number] GABAPENTIN 300 MG CAPS (GABAPENTIN) One tablet by mouth nightly  #20 x 0   Entered and Authorized by:   [name] FNP   Signed by:   [name] FNP on 12/14/2010   Method used:   Printed then faxed to ...       [facility] (retail)       [address] [place], [place]  [address]       Ph: [phone]       Fax: [phone]   RxID:   [id number]

## 2010-12-18 NOTE — Letter (Signed)
Summary: Handout Printed  Printed Handout:  - Sciatica

## 2010-12-18 NOTE — Assessment & Plan Note (Signed)
Summary: Sciatica   Vital Signs:  Patient profile:   55 year old female Menstrual status:  perimenopausal Weight:      233 pounds BMI:     38.91 Temp:     98.0 degrees F oral Pulse rate:   91 / minute Pulse rhythm:   regular Resp:     20 per minute BP sitting:   146 / 84  (left arm) Cuff size:   regular  Vitals Entered By: Isla Pence (December 12, 2010 10:09 AM)  Nutrition Counseling: Patient's BMI is greater than 25 and therefore counseled on weight management options. CC: follow-up visit DM...thigh pain since Saturday very painful. Is Patient Diabetic? Yes Pain Assessment Patient in pain? yes     Location: thigh Intensity: 9  Does patient need assistance? Functional Status Self care Ambulation Normal   Primary Care Provider:  Dimas Millin  CC:  follow-up visit DM...thigh pain since Saturday very painful.Marland Kitchen  History of Present Illness:  Pt into the office with c/o pain in the right thigh. Sudden onset about 3 days ago. Tried multiple attempts with otc meds - ibuprofen, tylenol, heat and ice pads Unable to find a comfortable position for rest. **Standing during exam** Denies any trauma  Medications Prior to Update: 1)  Metformin Hcl 500 Mg Tabs (Metformin Hcl) .... Two Tablets By Mouth Two Times A Day 2)  Glucotrol Xl 10 Mg Xr24h-Tab (Glipizide) .... One Tablet By Mouth Two Times A Day 3)  Januvia 100 Mg Tabs (Sitagliptin Phosphate) .Marland Kitchen.. 1 By Mouth Daily 4)  Accuretic 20-25 Mg Tabs (Quinapril-Hydrochlorothiazide) .... One Tablet By Mouth Daily For Blood Pressure 5)  Lantus Solostar 100 Unit/ml Soln (Insulin Glargine) .... 60  Units Subcutaneously Nightly For Diabetes 6)  Nexium 40 Mg Cpdr (Esomeprazole Magnesium) .... One Tablet By Mouth Daily Before Breakfast 7)  Novolog 100 Unit/ml Soln (Insulin Aspart) .Marland Kitchen.. 10 Units Subcutaneously Three Times A Day Before Meals For Blood Sugar 8)  Norvasc 10 Mg Tabs (Amlodipine Besylate) .... One Tablet By Mouth Daily For Blood  Pressure  Allergies (verified): No Known Drug Allergies  Review of Systems General:  Denies fever. CV:  Denies chest pain or discomfort. Resp:  Denies cough. GI:  Denies abdominal pain, nausea, and vomiting. MS:  Complains of muscle.  Physical Exam  General:  alert and overweight-appearing.   Head:  normocephalic.   Msk:  right thigh - no obvious deformity  Neurologic:  alert & oriented X3.   Skin:  color normal.   Psych:  Oriented X3.     Impression & Recommendations:  Problem # 1:  SCIATICA, RIGHT (ICD-724.3) reviewed dx with pt Her updated medication list for this problem includes:    Ibuprofen 800 Mg Tabs (Ibuprofen) ..... One tablet by mouth two times a day as needed for pain  Orders: Admin of Therapeutic Inj  intramuscular or subcutaneous [id number]) Ketorolac-Toradol 15mg  [id number])  Complete Medication List: 1)  Metformin Hcl 500 Mg Tabs (Metformin hcl) .... Two tablets by mouth two times a day 2)  Glucotrol Xl 10 Mg Xr24h-tab (Glipizide) .... One tablet by mouth two times a day 3)  Januvia 100 Mg Tabs (Sitagliptin phosphate) .Marland Kitchen.. 1 by mouth daily 4)  Accuretic 20-25 Mg Tabs (Quinapril-hydrochlorothiazide) .... One tablet by mouth daily for blood pressure 5)  Lantus Solostar 100 Unit/ml Soln (Insulin glargine) .... 60  units subcutaneously nightly for diabetes 6)  Nexium 40 Mg Cpdr (Esomeprazole magnesium) .... One tablet by mouth daily before breakfast 7)  Novolog  100 Unit/ml Soln (Insulin aspart) .Marland Kitchen.. 10 units subcutaneously three times a day before meals for blood sugar 8)  Norvasc 10 Mg Tabs (Amlodipine besylate) .... One tablet by mouth daily for blood pressure 9)  Prednisone 10 Mg Tabs (Prednisone) .... 30mg  x 2 days, 20mg  x 2 days then 10mg  x 2 days for inflammation 10)  Ibuprofen 800 Mg Tabs (Ibuprofen) .... One tablet by mouth two times a day as needed for pain  Patient Instructions: 1)  You have been given an injection of medication for pain.  2)   Inflammation injection not available so you will take the pill form instead 3)  You will need to take a prednisone taper for the next week 4)  Prednisone Taper 5)  Wednesday - 3 tablets 6)  Thursday- 3 tablets 7)  Friday - 2 tablets 8)  Saturday - 2 tablets 9)  Sunday 1 tablet 10)  Monday - 1 tablet 11)  AND 12)  Ibuprofen 800mg  by mouth two times a day with food for the next 3 days (Wednesday, Thursday, Friday) then as needed 13)  If pain still persistant in this intensity by Friday call this office and report 14)  Follow up in 2 weeks for lab visit - $10. Will need cbg, hbga1c and u/a. 15)  Be sure to follow up with provider at least every 3-4 months.  Make sure you get you medications filled at the [facility] pharmacy. Notify the provider if you have any problems. Prescriptions: IBUPROFEN 800 MG TABS (IBUPROFEN) One tablet by mouth two times a day as needed for pain  #20 x 0   Entered and Authorized by:   [name] FNP   Signed by:   [name] FNP on 12/12/2010   Method used:   Print then Give to Patient   RxID:   [id number] PREDNISONE 10 MG TABS (PREDNISONE) 30mg  x 2 days, 20mg  x 2 days then 10mg  x 2 days for inflammation  #12 x 0   Entered and Authorized by:   [name] FNP   Signed by:   [name] FNP on 12/12/2010   Method used:   Print then Give to Patient   RxID:   [id number]    Medication Administration  Injection # 1:    Medication: Ketorolac-Toradol 15mg     Diagnosis: SCIATICA, RIGHT (ICD-724.3)    Route: IM    Site: RUOQ gluteus    Exp Date: 10/13    Lot #: [id number]    Mfr: [email or web address]    Patient tolerated injection without complications    Given by: Isla Pence (December 12, 2010 11:31 AM)  Orders Added: 1)  Est. Patient Level III [id number] 2)  Admin of Therapeutic Inj  intramuscular or subcutaneous [96372] 3)  Ketorolac-Toradol 15mg  [J1885]     Medication Administration  Injection # 1:    Medication:  Ketorolac-Toradol 15mg     Diagnosis: SCIATICA, RIGHT (ICD-724.3)    Route: IM    Site: RUOQ gluteus    Exp Date: 10/13    Lot #: [id number]    Mfr: [email or web address]    Patient tolerated injection without complications    Given by: Isla Pence (December 12, 2010 11:31 AM)  Orders Added: 1)  Est. Patient Level III [id number] 2)  Admin of Therapeutic Inj  intramuscular or subcutaneous [96372] 3)  Ketorolac-Toradol 15mg  [J1885]

## 2011-02-22 NOTE — Op Note (Signed)
NAMEJERRILYN, FRANKENBERG                 ACCOUNT NO.:  [id number]   MEDICAL RECORD NO.:  [id number]          PATIENT TYPE:  AMB   LOCATION:  [place]                          FACILITY:  [facility]   PHYSICIAN:  Estill Bamberg. Ronnie Derby, M.D. DATE OF BIRTH:  December 23, 1955   DATE OF PROCEDURE:  DATE OF DISCHARGE:                                 OPERATIVE REPORT   SURGEON:  Estill Bamberg. Ronnie Derby, M.D.   ASSISTANT:  None.   ANESTHESIA:  General   PREOPERATIVE DIAGNOSIS:  Left knee medial meniscus tear, NOA.   POSTOPERATIVE DIAGNOSIS:  Left knee medial meniscus tear, NOA.   PROCEDURE:  Left knee partial medial meniscectomy of the anterior horn and  chondroplasty of the patella.   INDICATIONS FOR PROCEDURE:  The patient is a 55 year old black female with  mechanical symptoms and failure of conservative measures.  Informed consent  was obtained.   DESCRIPTION OF PROCEDURE:  The patient was laid supine under MAC anesthesia  after a preoperative knee block.  After sterile prep and drape inferolateral  and inferomedial portals were created with a #11 blade, blunt trocar and  cannula.  Diagnostic arthroscopy with __________ grade 3 and even some small  grade 4 areas in the lateral facet of the patella of chondromalacia.  The  trochlea had some grade 1 changes.  I then went into the medical  compartment.  There was a very obvious anterior medial meniscus tear with a  piece impinging on the joint. I debrided this with a shaver, finished the  meniscectomy with an ArthroCare capsular wand.  I used a hook probe to try  to identify a tear in the posterior horn which was a question on MRI,  however, I could not find one and it looked good visually.  There was very  little chondromalacia in the medial compartment.  The lateral compartment  was normal.  The ACL appeared to possibly have some old injury to it, but  appeared stable. I then lavaged the knee closed with interrupted 4-0 nylon  sutures dressed with Xeroform  dressings, sponges, sterile Webril and Ace  wrap.   COMPLICATIONS:  None.   DRAINS:  None.      SDL/MEDQ  D:  03/06/2005  T:  03/06/2005  Job:  [id number]

## 2011-07-08 LAB — URINALYSIS, ROUTINE W REFLEX MICROSCOPIC
Glucose, UA: >1000 — AB
Leukocytes, UA: NEGATIVE
Nitrite: NEGATIVE
Specific Gravity, Urine: 1.037 — ABNORMAL HIGH
pH: 5.5

## 2011-07-08 LAB — CBC
HCT: 41.5
MCV: 89.5
Platelets: 306
RDW: 13.1

## 2011-07-08 LAB — DIFFERENTIAL
Basophils Absolute: 0
Basophils Relative: 0
Monocytes Relative: 2 — ABNORMAL LOW
Neutro Abs: 6.3
Neutrophils Relative %: 90 — ABNORMAL HIGH

## 2011-07-08 LAB — URINE CULTURE: Colony Count: >=100000

## 2011-07-08 LAB — BASIC METABOLIC PANEL
Calcium: 8.7
Creatinine, Ser: 0.69
GFR calc non Af Amer: >60
Glucose, Bld: 305 — ABNORMAL HIGH
Sodium: 139

## 2011-07-08 LAB — URINE MICROSCOPIC-ADD ON

## 2011-07-08 LAB — GLUCOSE, CAPILLARY: Glucose-Capillary: 262 — ABNORMAL HIGH

## 2011-07-30 ENCOUNTER — Other Ambulatory Visit: Payer: Self-pay | Admitting: Internal Medicine

## 2011-07-31 ENCOUNTER — Other Ambulatory Visit: Payer: Self-pay | Admitting: Internal Medicine

## 2011-08-06 ENCOUNTER — Other Ambulatory Visit (HOSPITAL_COMMUNITY): Payer: Self-pay

## 2011-08-12 ENCOUNTER — Ambulatory Visit (HOSPITAL_COMMUNITY)
Admission: RE | Admit: 2011-08-12 | Discharge: 2011-08-12 | Disposition: A | Discharge status: Home / Self Care | Payer: Self-pay | Source: Ambulatory Visit | Attending: Internal Medicine | Admitting: Internal Medicine

## 2011-08-12 ENCOUNTER — Other Ambulatory Visit: Payer: Self-pay | Admitting: Internal Medicine

## 2011-08-12 DIAGNOSIS — Z90710 Acquired absence of both cervix and uterus: Secondary | ICD-10-CM | POA: Insufficient documentation

## 2011-08-12 DIAGNOSIS — R102 Pelvic and perineal pain: Secondary | ICD-10-CM

## 2011-08-12 DIAGNOSIS — R109 Unspecified abdominal pain: Secondary | ICD-10-CM | POA: Insufficient documentation

## 2012-02-14 ENCOUNTER — Emergency Department (HOSPITAL_COMMUNITY)
Admission: EM | Admit: 2012-02-14 | Discharge: 2012-02-14 | Disposition: A | Discharge status: Home / Self Care | Payer: BC Managed Care – PPO | Attending: Emergency Medicine | Admitting: Emergency Medicine

## 2012-02-14 ENCOUNTER — Encounter (HOSPITAL_COMMUNITY): Payer: Self-pay | Admitting: *Deleted

## 2012-02-14 DIAGNOSIS — E1169 Type 2 diabetes mellitus with other specified complication: Secondary | ICD-10-CM | POA: Insufficient documentation

## 2012-02-14 DIAGNOSIS — R531 Weakness: Secondary | ICD-10-CM

## 2012-02-14 DIAGNOSIS — Z79899 Other long term (current) drug therapy: Secondary | ICD-10-CM | POA: Insufficient documentation

## 2012-02-14 DIAGNOSIS — Z794 Long term (current) use of insulin: Secondary | ICD-10-CM | POA: Insufficient documentation

## 2012-02-14 DIAGNOSIS — R42 Dizziness and giddiness: Secondary | ICD-10-CM | POA: Insufficient documentation

## 2012-02-14 DIAGNOSIS — R5381 Other malaise: Secondary | ICD-10-CM | POA: Insufficient documentation

## 2012-02-14 DIAGNOSIS — I10 Essential (primary) hypertension: Secondary | ICD-10-CM | POA: Insufficient documentation

## 2012-02-14 DIAGNOSIS — R739 Hyperglycemia, unspecified: Secondary | ICD-10-CM

## 2012-02-14 HISTORY — DX: Essential (primary) hypertension: I10

## 2012-02-14 LAB — URINALYSIS, ROUTINE W REFLEX MICROSCOPIC
Ketones, ur: NEGATIVE mg/dL
Leukocytes, UA: NEGATIVE
Nitrite: NEGATIVE
Protein, ur: 100 mg/dL — AB
Urobilinogen, UA: 0.2 mg/dL (ref 0.0–1.0)
pH: 6 (ref 5.0–8.0)

## 2012-02-14 LAB — POCT I-STAT, CHEM 8
Calcium, Ion: 1.19 mmol/L (ref 1.12–1.32)
Creatinine, Ser: 0.7 mg/dL (ref 0.50–1.10)
Glucose, Bld: 293 mg/dL — ABNORMAL HIGH (ref 70–99)
Hemoglobin: 14.6 g/dL (ref 12.0–15.0)
TCO2: 27 mmol/L (ref 0–100)

## 2012-02-14 LAB — GLUCOSE, CAPILLARY

## 2012-02-14 MED ORDER — SODIUM CHLORIDE 0.9 % IV SOLN: INTRAVENOUS | Status: DC

## 2012-02-14 MED ORDER — SODIUM CHLORIDE 0.9 % IV BOLUS (SEPSIS)
1000.0000 mL | Freq: Once | INTRAVENOUS | Status: AC
  Administered 2012-02-14: 1000 mL via INTRAVENOUS

## 2012-02-14 NOTE — ED Notes (Signed)
Pt aware urine sample is needed 

## 2012-02-14 NOTE — ED Provider Notes (Signed)
History     CSN: [id number]  Arrival date & time 02/14/12  1425   First MD Initiated Contact with Patient 02/14/12 1501      Chief Complaint  Patient presents with  . Hyperglycemia  . Dizziness  . Nausea  . Headache    (Consider location/radiation/quality/duration/timing/severity/associated sxs/prior treatment) HPI  Patient relates she felt fine yesterday and they went to the carnival. She her husband ate the same thing however patient started having some vomiting last night. She denies abdominal pain or diarrhea. She states she has chronic constipation but states her last normal bowel movement was yesterday. She states today she feels dizzy and old is oriented. She's had nausea and vomiting earlier today. She states she has a very slight headache. She states this morning she checked her CBG in was 450, she took 45 units of her insulin and one or 2 hours later her blood sugar was 465 and she felt worse this is when she presented to the emergency department. She relates she feels like she has something in her right upper quadrant. She has been evaluated for this before. She had a abdominal ultrasound done in October that was negative for gallstones.  Patient was going to [facility]  on [address] however she has her first appointment with Dr. Shirline Frees in 3 days, she has got her insurance reinstated and she is seen him in the past  PCP Dr Rogers Blocker  Past Medical History  Diagnosis Date  . Diabetes mellitus   . Hypertension     Past Surgical History  Procedure Date  . Abdominal hysterectomy   . Trigger finger release   . Knee surgery     No family history on file.  History  Substance Use Topics  . Smoking status: Never Smoker   . Smokeless tobacco: Not on file  . Alcohol Use: Yes     rarely   unemployed Lives with husband  OB History    Grav Para Term Preterm Abortions TAB SAB Ect Mult Living                  Review of Systems  All other systems  reviewed and are negative.    Allergies  Review of patient's allergies indicates no known allergies.  Home Medications   Current Outpatient Rx  Name Route Sig Dispense Refill  . AMLODIPINE BESYLATE 10 MG PO TABS Oral Take 10 mg by mouth daily.    Marland Kitchen ESOMEPRAZOLE MAGNESIUM 40 MG PO CPDR Oral Take 40 mg by mouth daily before breakfast.    . GLIPIZIDE ER 10 MG PO TB24 Oral Take 10 mg by mouth 2 (two) times daily.    . INSULIN GLARGINE 100 UNIT/ML [name] SOLN Subcutaneous Inject 42-65 Units into the skin 2 (two) times daily. PT TAKES 42 UNITS IN THE MORNING. 42 UNITS IN THE EVENING    . QUINAPRIL-HYDROCHLOROTHIAZIDE 20-25 MG PO TABS Oral Take 1 tablet by mouth daily.    Marland Kitchen SITAGLIPTIN PHOSPHATE 100 MG PO TABS Oral Take 100 mg by mouth daily.      BP 149/75  Pulse 90  Temp(Src) 98 F (36.7 C) (Oral)  Resp 18  SpO2 93%  Vital signs normal   Physical Exam  Nursing note and vitals reviewed. Constitutional: She is oriented to person, place, and time. She appears well-developed and well-nourished.  Non-toxic appearance. She does not appear ill. No distress.  HENT:  Head: Normocephalic and atraumatic.  Right Ear: External ear normal.  Left Ear: External ear normal.  Nose: Nose normal. No mucosal edema or rhinorrhea.  Mouth/Throat: Oropharynx is clear and moist and mucous membranes are normal. No dental abscesses or uvula swelling.  Eyes: Conjunctivae and EOM are normal. Pupils are equal, round, and reactive to light.  Neck: Normal range of motion and full passive range of motion without pain. Neck supple.  Cardiovascular: Normal rate, regular rhythm and normal heart sounds.  Exam reveals no gallop and no friction rub.   No murmur heard. Pulmonary/Chest: Effort normal and breath sounds normal. No respiratory distress. She has no wheezes. She has no rhonchi. She has no rales. She exhibits no tenderness and no crepitus.  Abdominal: Soft. Normal appearance and bowel sounds are normal. She  exhibits no distension. There is no tenderness. There is no rebound and no guarding.  Musculoskeletal: Normal range of motion. She exhibits no edema and no tenderness.       Moves all extremities well.   Neurological: She is alert and oriented to person, place, and time. She has normal strength. No cranial nerve deficit.  Skin: Skin is warm, dry and intact. No rash noted. No erythema. No pallor.  Psychiatric: Her speech is normal and behavior is normal. Her mood appears not anxious.       Flat affect    ED Course  Procedures (including critical care time)   Medications  0.9 %  sodium chloride infusion (not administered)  sodium chloride 0.9 % bolus 1,000 mL (1000 mL Intravenous Given 02/14/12 1656)   Patient is feeling better after receiving IV fluids. We discussed [place] watching her diabetic diet more closely. I feel the carnival food probably made her blood sugar bump up.  Results for orders placed during the hospital encounter of 02/14/12  URINALYSIS, ROUTINE W REFLEX MICROSCOPIC      Component Value Range   Color, Urine YELLOW  YELLOW    APPearance CLEAR  CLEAR    Specific Gravity, Urine 1.020  1.005 - 1.030    pH 6.0  5.0 - 8.0    Glucose, UA >1000 (*) NEGATIVE (mg/dL)   Hgb urine dipstick SMALL (*) NEGATIVE    Bilirubin Urine NEGATIVE  NEGATIVE    Ketones, ur NEGATIVE  NEGATIVE (mg/dL)   Protein, ur 100 (*) NEGATIVE (mg/dL)   Urobilinogen, UA 0.2  0.0 - 1.0 (mg/dL)   Nitrite NEGATIVE  NEGATIVE    Leukocytes, UA NEGATIVE  NEGATIVE   GLUCOSE, CAPILLARY      Component Value Range   Glucose-Capillary 271 (*) 70 - 99 (mg/dL)  LIPASE, BLOOD      Component Value Range   Lipase 26  11 - 59 (U/L)  POCT I-STAT, CHEM 8      Component Value Range   Sodium 138  135 - 145 (mEq/L)   Potassium 4.0  3.5 - 5.1 (mEq/L)   Chloride 101  96 - 112 (mEq/L)   BUN 18  6 - 23 (mg/dL)   Creatinine, Ser 0.70  0.50 - 1.10 (mg/dL)   Glucose, Bld 293 (*) 70 - 99 (mg/dL)   Calcium, Ion 1.19  1.12 -  1.32 (mmol/L)   TCO2 27  0 - 100 (mmol/L)   Hemoglobin 14.6  12.0 - 15.0 (g/dL)   HCT 43.0  36.0 - 46.0 (%)  URINE MICROSCOPIC-ADD ON      Component Value Range   Squamous Epithelial / LPF RARE  RARE    WBC, UA 0-2  <3 (WBC/hpf)   RBC / HPF  0-2  <3 (RBC/hpf)  GLUCOSE, CAPILLARY      Component Value Range   Glucose-Capillary 247 (*) 70 - 99 (mg/dL)   Comment 1 Notify RN     Laboratory interpretation all normal except hyperglycemia without acidosis     Date: 02/14/2012  Rate: 78  Rhythm: normal sinus rhythm  QRS Axis: normal  Intervals: normal  ST/T Wave abnormalities: normal  Conduction Disutrbances:LVH  Narrative Interpretation:   Old EKG Reviewed: none available     1. Weakness   2. Hyperglycemia    Plan discharge  Rolland Porter, MD, [place], MD 02/14/12 2102

## 2012-02-14 NOTE — ED Notes (Addendum)
Pt noted blood sugars in the 400s earlier today. Pt did not take any additional insulin to decrease this number. Pt reports nausea and weakness with the high sugar. Pt reports she was concerned she would pass out and has felt light-headed. Pt denies history of high blood sugars.  Pt sugar in ER was 271.  Pt continues to feel dizzy and nauseated.  Pt reports headache 4/10.

## 2012-02-14 NOTE — Discharge Instructions (Signed)
Look at the diabetic diet again and tied to follow it. Continue taking your insulin over the weekend. Keep your appointment on Monday, May 13 with Dr. Kenton Kingfisher. He can access your ED records in the computer for that visit. Return to the emergency department if you feel worse

## 2012-03-26 ENCOUNTER — Encounter (INDEPENDENT_AMBULATORY_CARE_PROVIDER_SITE_OTHER): Payer: BC Managed Care – PPO | Admitting: Ophthalmology

## 2012-04-02 ENCOUNTER — Encounter (INDEPENDENT_AMBULATORY_CARE_PROVIDER_SITE_OTHER): Payer: BC Managed Care – PPO | Admitting: Ophthalmology

## 2012-04-02 DIAGNOSIS — H3581 Retinal edema: Secondary | ICD-10-CM

## 2012-04-02 DIAGNOSIS — E1139 Type 2 diabetes mellitus with other diabetic ophthalmic complication: Secondary | ICD-10-CM

## 2012-04-02 DIAGNOSIS — H43819 Vitreous degeneration, unspecified eye: Secondary | ICD-10-CM

## 2012-04-02 DIAGNOSIS — H35039 Hypertensive retinopathy, unspecified eye: Secondary | ICD-10-CM

## 2012-04-02 DIAGNOSIS — E11319 Type 2 diabetes mellitus with unspecified diabetic retinopathy without macular edema: Secondary | ICD-10-CM

## 2012-04-02 DIAGNOSIS — H35419 Lattice degeneration of retina, unspecified eye: Secondary | ICD-10-CM

## 2012-04-02 DIAGNOSIS — H2510 Age-related nuclear cataract, unspecified eye: Secondary | ICD-10-CM

## 2012-04-16 ENCOUNTER — Ambulatory Visit (INDEPENDENT_AMBULATORY_CARE_PROVIDER_SITE_OTHER): Payer: BC Managed Care – PPO | Admitting: Ophthalmology

## 2012-04-16 DIAGNOSIS — H3581 Retinal edema: Secondary | ICD-10-CM

## 2012-08-27 ENCOUNTER — Ambulatory Visit (INDEPENDENT_AMBULATORY_CARE_PROVIDER_SITE_OTHER): Payer: BC Managed Care – PPO | Admitting: Ophthalmology

## 2012-10-04 ENCOUNTER — Emergency Department (HOSPITAL_COMMUNITY): Payer: BC Managed Care – PPO

## 2012-10-04 ENCOUNTER — Emergency Department (HOSPITAL_COMMUNITY)
Admission: EM | Admit: 2012-10-04 | Discharge: 2012-10-04 | Disposition: A | Discharge status: Home / Self Care | Payer: BC Managed Care – PPO | Attending: Emergency Medicine | Admitting: Emergency Medicine

## 2012-10-04 DIAGNOSIS — Z79899 Other long term (current) drug therapy: Secondary | ICD-10-CM | POA: Insufficient documentation

## 2012-10-04 DIAGNOSIS — Z794 Long term (current) use of insulin: Secondary | ICD-10-CM | POA: Insufficient documentation

## 2012-10-04 DIAGNOSIS — M25879 Other specified joint disorders, unspecified ankle and foot: Secondary | ICD-10-CM | POA: Insufficient documentation

## 2012-10-04 DIAGNOSIS — M722 Plantar fascial fibromatosis: Secondary | ICD-10-CM

## 2012-10-04 DIAGNOSIS — G5760 Lesion of plantar nerve, unspecified lower limb: Secondary | ICD-10-CM

## 2012-10-04 DIAGNOSIS — E119 Type 2 diabetes mellitus without complications: Secondary | ICD-10-CM | POA: Insufficient documentation

## 2012-10-04 DIAGNOSIS — I10 Essential (primary) hypertension: Secondary | ICD-10-CM | POA: Insufficient documentation

## 2012-10-04 LAB — CBC WITH DIFFERENTIAL/PLATELET
Eosinophils Relative: 2 % (ref 0–5)
HCT: 40.6 % (ref 36.0–46.0)
Lymphocytes Relative: 45 % (ref 12–46)
Lymphs Abs: 3.4 10*3/uL (ref 0.7–4.0)
MCV: 84.6 fL (ref 78.0–100.0)
Monocytes Absolute: 0.4 10*3/uL (ref 0.1–1.0)
Neutro Abs: 3.6 10*3/uL (ref 1.7–7.7)
RBC: 4.8 MIL/uL (ref 3.87–5.11)
WBC: 7.5 10*3/uL (ref 4.0–10.5)

## 2012-10-04 LAB — BASIC METABOLIC PANEL
CO2: 26 mEq/L (ref 19–32)
Calcium: 9.5 mg/dL (ref 8.4–10.5)
Chloride: 97 mEq/L (ref 96–112)
Glucose, Bld: 318 mg/dL — ABNORMAL HIGH (ref 70–99)
Sodium: 134 mEq/L — ABNORMAL LOW (ref 135–145)

## 2012-10-04 MED ORDER — HYDROCODONE-ACETAMINOPHEN 5-325 MG PO TABS
2.0000 | ORAL_TABLET | Freq: Once | ORAL | Status: AC
  Administered 2012-10-04: 2 via ORAL
  Filled 2012-10-04: qty 2

## 2012-10-04 MED ORDER — HYDROCODONE-ACETAMINOPHEN 5-325 MG PO TABS: 2.0000 | ORAL_TABLET | ORAL | Status: DC | PRN

## 2012-10-04 NOTE — ED Notes (Signed)
Pt states she has a ride home. 

## 2012-10-04 NOTE — ED Notes (Signed)
Patient transported to X-ray 

## 2012-10-04 NOTE — ED Notes (Signed)
Pt c/o R foot pain since 12/25. Pt denies injury to foot. Pt states pain is getting worse. Pain is just under toes. Pt states she has been soaking foot in Epsom salts and Motrin with no relief. Pt arrives in wheelchair at triage.

## 2012-10-04 NOTE — ED Provider Notes (Signed)
History     CSN: [id number]  Arrival date & time 10/04/12  [id number]   First MD Initiated Contact with Patient 10/04/12 1951      Chief Complaint  Patient presents with  . Foot Pain    (Consider location/radiation/quality/duration/timing/severity/associated sxs/prior treatment) HPI Comments: Patient is a 56 year old female who presents with left foot pain for the past 4 days. Patient denies any injury. The pain is located under her toes and on her heel of the left foot. Patient reports gradual onset and progressive worsening of pain . Patient reports progressive worsening of pain. Weight bearing activity makes the pain worse. Nothing makes the pain better. No associated symptoms. Patient has tried ibuprofen for pain without relief. Patient denies obvious deformity, numbness/tingling, coolness/weakness of extremity, bruising, and any other injury.      Past Medical History  Diagnosis Date  . Diabetes mellitus   . Hypertension     Past Surgical History  Procedure Date  . Abdominal hysterectomy   . Trigger finger release   . Knee surgery     No family history on file.  History  Substance Use Topics  . Smoking status: Never Smoker   . Smokeless tobacco: Not on file  . Alcohol Use: Yes     Comment: rarely     OB History    Grav Para Term Preterm Abortions TAB SAB Ect Mult Living                  Review of Systems  Musculoskeletal: Positive for arthralgias.  All other systems reviewed and are negative.    Allergies  Review of patient's allergies indicates no known allergies.  Home Medications   Current Outpatient Rx  Name  Route  Sig  Dispense  Refill  . ACETAMINOPHEN 500 MG PO TABS   Oral   Take 1,500 mg by mouth every 6 (six) hours as needed. pain         . AMLODIPINE BESYLATE 10 MG PO TABS   Oral   Take 10 mg by mouth daily.         Marland Kitchen GLIPIZIDE ER 10 MG PO TB24   Oral   Take 20 mg by mouth daily.          . INSULIN GLARGINE 100 UNIT/ML [place]  SOLN   Subcutaneous   Inject 60 Units into the skin at bedtime.          . QUINAPRIL-HYDROCHLOROTHIAZIDE 20-25 MG PO TABS   Oral   Take 1 tablet by mouth daily.         Marland Kitchen SITAGLIPTIN PHOSPHATE 100 MG PO TABS   Oral   Take 100 mg by mouth daily.           BP 145/62  Pulse 79  Temp 98.2 F (36.8 C) (Oral)  Resp 16  SpO2 99%  Physical Exam  Nursing note and vitals reviewed. Constitutional: She appears well-developed and well-nourished. No distress.  HENT:  Head: Normocephalic and atraumatic.  Eyes: Conjunctivae normal are normal.  Neck: Normal range of motion. Neck supple.  Cardiovascular: Normal rate and regular rhythm.  Exam reveals no gallop and no friction rub.   No murmur heard. Pulmonary/Chest: Effort normal and breath sounds normal. She has no wheezes. She has no rales. She exhibits no tenderness.  Abdominal: Soft. There is no tenderness.  Musculoskeletal: Normal range of motion.       Tenderness to palpation over right heel. No obvious deformity. No edema or  erythema noted.   Neurological: She is alert.       Strength and sensation equal and intact bilaterally. Speech is goal-oriented. Moves limbs without ataxia.   Skin: Skin is warm and dry.  Psychiatric: She has a normal mood and affect. Her behavior is normal.    ED Course  Procedures (including critical care time)  Labs Reviewed  BASIC METABOLIC PANEL - Abnormal; Notable for the following:    Sodium 134 (*)     Glucose, Bld 318 (*)     BUN 24 (*)     GFR calc non Af Amer 78 (*)     All other components within normal limits  CBC WITH DIFFERENTIAL  LAB REPORT - SCANNED   Dg Foot Complete Right  10/04/2012  *RADIOLOGY REPORT*  Clinical Data: Right foot pain.  No known injury.  RIGHT FOOT COMPLETE - 3+ VIEW  Comparison: None.  Findings: No evidence of fracture or dislocation.  No evidence of osteolysis or periostitis.  No evidence of arthropathy or other significant bone abnormality.  Mild peripheral  vascular calcification noted.  IMPRESSION: No acute findings.   Original Report Authenticated By: Earle Gell, M.D.      1. Morton neuroma   2. Plantar fasciitis       MDM  8:42 PM Labs unremarkable. Xray unremarkable. Patient will have a prescription for pain medication and have orthopedic follow up. No further evaluation needed at this time. No neurovascular compromise.        Alvina Chou, [place] 10/06/12 [phone]

## 2012-10-08 NOTE — ED Provider Notes (Signed)
Medical screening examination/treatment/procedure(s) were performed by non-physician practitioner and as supervising physician I was immediately available for consultation/collaboration.  Threasa Beards, MD 10/08/12 [phone]

## 2013-04-27 ENCOUNTER — Emergency Department (HOSPITAL_COMMUNITY)
Admission: EM | Admit: 2013-04-27 | Discharge: 2013-04-27 | Disposition: A | Discharge status: Home / Self Care | Payer: BC Managed Care – PPO | Attending: Emergency Medicine | Admitting: Emergency Medicine

## 2013-04-27 ENCOUNTER — Encounter (HOSPITAL_COMMUNITY): Payer: Self-pay | Admitting: Emergency Medicine

## 2013-04-27 DIAGNOSIS — H81399 Other peripheral vertigo, unspecified ear: Secondary | ICD-10-CM

## 2013-04-27 DIAGNOSIS — I10 Essential (primary) hypertension: Secondary | ICD-10-CM | POA: Insufficient documentation

## 2013-04-27 DIAGNOSIS — Z79899 Other long term (current) drug therapy: Secondary | ICD-10-CM | POA: Insufficient documentation

## 2013-04-27 DIAGNOSIS — E119 Type 2 diabetes mellitus without complications: Secondary | ICD-10-CM | POA: Insufficient documentation

## 2013-04-27 DIAGNOSIS — Z794 Long term (current) use of insulin: Secondary | ICD-10-CM | POA: Insufficient documentation

## 2013-04-27 MED ORDER — SODIUM CHLORIDE 0.9 % IV BOLUS (SEPSIS)
1000.0000 mL | Freq: Once | INTRAVENOUS | Status: AC
  Administered 2013-04-27: 1000 mL via INTRAVENOUS

## 2013-04-27 MED ORDER — DIAZEPAM 5 MG/ML IJ SOLN
5.0000 mg | Freq: Once | INTRAMUSCULAR | Status: AC
  Administered 2013-04-27: 5 mg via INTRAVENOUS
  Filled 2013-04-27: qty 2

## 2013-04-27 MED ORDER — DIAZEPAM 5 MG PO TABS: 5.0000 mg | ORAL_TABLET | Freq: Four times a day (QID) | ORAL | Status: DC | PRN

## 2013-04-27 NOTE — ED Provider Notes (Signed)
History    CSN: [id number] Arrival date & time 04/27/13  [id number]  First MD Initiated Contact with Patient 04/27/13 [phone]     Chief Complaint  Patient presents with  . Dizziness   (Consider location/radiation/quality/duration/timing/severity/associated sxs/prior Treatment) HPI This is a 57 year old female who recently was placed on amoxicillin and Hycodan for pneumonia. For the past 3 days she's been having episodes of dizziness by which she means the room is spinning. These episodes are moderate to severe. They're worse when lying supine or on her right side. They're also worse when she stands up. She has had a severe episode that began about 2 this morning as she was walking to the bathroom. It is less severe now as he was earlier but still present. It has been accompanied by nausea, vomiting and left ear tinnitus.  Past Medical History  Diagnosis Date  . Diabetes mellitus   . Hypertension    Past Surgical History  Procedure Laterality Date  . Abdominal hysterectomy    . Trigger finger release    . Knee surgery     No family history on file. History  Substance Use Topics  . Smoking status: Never Smoker   . Smokeless tobacco: Not on file  . Alcohol Use: Yes     Comment: rarely    OB History   Grav Para Term Preterm Abortions TAB SAB Ect Mult Living                 Review of Systems  All other systems reviewed and are negative.    Allergies  Review of patient's allergies indicates no known allergies.  Home Medications   Current Outpatient Rx  Name  Route  Sig  Dispense  Refill  . acetaminophen (TYLENOL) 500 MG tablet   Oral   Take 1,500 mg by mouth every 6 (six) hours as needed. pain         . amLODipine (NORVASC) 10 MG tablet   Oral   Take 10 mg by mouth daily.         Marland Kitchen glipiZIDE (GLUCOTROL XL) 10 MG 24 hr tablet   Oral   Take 20 mg by mouth daily.          Marland Kitchen HYDROcodone-acetaminophen (NORCO/VICODIN) 5-325 MG per tablet   Oral   Take 2 tablets by  mouth every 4 (four) hours as needed for pain.   15 tablet   0   . insulin glargine (LANTUS) 100 UNIT/ML injection   Subcutaneous   Inject 60 Units into the skin at bedtime.          . quinapril-hydrochlorothiazide (ACCURETIC) 20-25 MG per tablet   Oral   Take 1 tablet by mouth daily.         . sitaGLIPtin (JANUVIA) 100 MG tablet   Oral   Take 100 mg by mouth daily.          BP 177/76  Temp(Src) 98.1 F (36.7 C) (Oral)  Resp 18  Physical Exam General: Well-developed, well-nourished female in no acute distress; appearance consistent with age of record HENT: normocephalic, atraumatic; TMs normal Eyes: pupils equal round and reactive to light; extraocular muscles intact; no nystagmus Neck: supple Heart: regular rate and rhythm Lungs: clear to auscultation bilaterally Abdomen: soft; nondistended; nontender; bowel sounds present Extremities: No deformity; full range of motion; pulses normal; no edema Neurologic: Awake, alert and oriented; motor function intact in all extremities and symmetric; no facial droop Skin: Warm and dry Psychiatric: Normal mood  and affect    ED Course  Procedures (including critical care time)   MDM  7:13 AM Vertigo symptoms controlled with IV Valium.  Wynetta Fines, MD 04/27/13 [phone]

## 2013-04-27 NOTE — ED Notes (Signed)
Pt reports dizziness x 3days. Feels like the room is spinning. Pt reports feeling off balance and nv. Recently been tx for cold/pneumonia. Pt taking Hycodan and amoxicillin.

## 2013-10-12 ENCOUNTER — Emergency Department (HOSPITAL_COMMUNITY): Payer: BC Managed Care – PPO

## 2013-10-12 ENCOUNTER — Emergency Department (HOSPITAL_COMMUNITY)
Admission: EM | Admit: 2013-10-12 | Discharge: 2013-10-13 | Disposition: A | Discharge status: Home / Self Care | Payer: BC Managed Care – PPO | Attending: Emergency Medicine | Admitting: Emergency Medicine

## 2013-10-12 ENCOUNTER — Encounter (HOSPITAL_COMMUNITY): Payer: Self-pay | Admitting: Emergency Medicine

## 2013-10-12 DIAGNOSIS — R002 Palpitations: Secondary | ICD-10-CM

## 2013-10-12 DIAGNOSIS — I493 Ventricular premature depolarization: Secondary | ICD-10-CM

## 2013-10-12 DIAGNOSIS — E119 Type 2 diabetes mellitus without complications: Secondary | ICD-10-CM | POA: Insufficient documentation

## 2013-10-12 DIAGNOSIS — R059 Cough, unspecified: Secondary | ICD-10-CM | POA: Insufficient documentation

## 2013-10-12 DIAGNOSIS — Z7982 Long term (current) use of aspirin: Secondary | ICD-10-CM | POA: Insufficient documentation

## 2013-10-12 DIAGNOSIS — IMO0002 Reserved for concepts with insufficient information to code with codable children: Secondary | ICD-10-CM | POA: Insufficient documentation

## 2013-10-12 DIAGNOSIS — Z794 Long term (current) use of insulin: Secondary | ICD-10-CM | POA: Insufficient documentation

## 2013-10-12 DIAGNOSIS — Z79899 Other long term (current) drug therapy: Secondary | ICD-10-CM | POA: Insufficient documentation

## 2013-10-12 DIAGNOSIS — R0602 Shortness of breath: Secondary | ICD-10-CM | POA: Insufficient documentation

## 2013-10-12 DIAGNOSIS — R05 Cough: Secondary | ICD-10-CM | POA: Insufficient documentation

## 2013-10-12 DIAGNOSIS — I10 Essential (primary) hypertension: Secondary | ICD-10-CM | POA: Insufficient documentation

## 2013-10-12 DIAGNOSIS — I4949 Other premature depolarization: Secondary | ICD-10-CM | POA: Insufficient documentation

## 2013-10-12 LAB — POCT I-STAT TROPONIN I: Troponin i, poc: 0 ng/mL (ref 0.00–0.08)

## 2013-10-12 LAB — TROPONIN I

## 2013-10-12 LAB — BASIC METABOLIC PANEL
BUN: 20 mg/dL (ref 6–23)
CHLORIDE: 95 meq/L — AB (ref 96–112)
CO2: 25 meq/L (ref 19–32)
CREATININE: 0.81 mg/dL (ref 0.50–1.10)
Calcium: 9.3 mg/dL (ref 8.4–10.5)
GFR calc Af Amer: >90 mL/min (ref >90)
GFR calc non Af Amer: 80 mL/min — ABNORMAL LOW (ref >90)
GLUCOSE: 421 mg/dL — AB (ref 70–99)
Potassium: 4.8 mEq/L (ref 3.7–5.3)
Sodium: 136 mEq/L — ABNORMAL LOW (ref 137–147)

## 2013-10-12 LAB — CBC
HCT: 37.3 % (ref 36.0–46.0)
HEMOGLOBIN: 12.9 g/dL (ref 12.0–15.0)
MCH: 29.6 pg (ref 26.0–34.0)
MCHC: 34.6 g/dL (ref 30.0–36.0)
MCV: 85.6 fL (ref 78.0–100.0)
Platelets: 271 10*3/uL (ref 150–400)
RBC: 4.36 MIL/uL (ref 3.87–5.11)
RDW: 12.8 % (ref 11.5–15.5)
WBC: 7.8 10*3/uL (ref 4.0–10.5)

## 2013-10-12 LAB — D-DIMER, QUANTITATIVE (NOT AT ARMC): D DIMER QUANT: 0.48 ug{FEU}/mL (ref 0.00–0.48)

## 2013-10-12 LAB — PRO B NATRIURETIC PEPTIDE: Pro B Natriuretic peptide (BNP): 20.3 pg/mL (ref 0–125)

## 2013-10-12 LAB — GLUCOSE, CAPILLARY: GLUCOSE-CAPILLARY: 349 mg/dL — AB (ref 70–99)

## 2013-10-12 MED ORDER — SODIUM CHLORIDE 0.9 % IV BOLUS (SEPSIS)
1000.0000 mL | INTRAVENOUS | Status: AC
  Administered 2013-10-12: 1000 mL via INTRAVENOUS

## 2013-10-12 NOTE — ED Provider Notes (Signed)
CSN: [id number]     Arrival date & time 10/12/13  1955 History   First MD Initiated Contact with Patient 10/12/13 2258     Chief Complaint  Patient presents with  . Palpitations   (Consider location/radiation/quality/duration/timing/severity/associated sxs/prior Treatment) HPI Comments: 2 day history of intermittent palpitations that last a few seconds at a time. Becoming worse today. Denies any chest pain, shortness of breath, nausea or vomiting. She states the palpitations "takes her breath away" the longest it lasted just a couple seconds. She denies any heaviness in her chest, chest pain with exertion, abdominal pain, nausea or vomiting. She has had a dry cough and felt generally weak for the past couple days. She denies any cardiac history. Has never had a stress test. She is a history of diabetes and hypertension. She states compliance with her medications. Denies any chest pain with exertion, leg pain or leg swelling. She admits to caffeine use, though not daily.  The history is provided by the patient.    Past Medical History  Diagnosis Date  . Diabetes mellitus   . Hypertension    Past Surgical History  Procedure Laterality Date  . Abdominal hysterectomy    . Trigger finger release    . Knee surgery     History reviewed. No pertinent family history. History  Substance Use Topics  . Smoking status: Never Smoker   . Smokeless tobacco: Not on file  . Alcohol Use: No     Comment:     OB History   Grav Para Term Preterm Abortions TAB SAB Ect Mult Living                 Review of Systems  Constitutional: Negative for fever, activity change and appetite change.  Eyes: Negative for visual disturbance.  Respiratory: Positive for cough and shortness of breath. Negative for chest tightness.   Cardiovascular: Positive for palpitations. Negative for chest pain.  Gastrointestinal: Negative for nausea, vomiting and abdominal pain.  Genitourinary: Negative for dysuria, hematuria,  vaginal bleeding and vaginal discharge.  Musculoskeletal: Negative for back pain.  Skin: Negative for rash.  Neurological: Negative for dizziness, weakness and headaches.  A complete 10 system review of systems was obtained and all systems are negative except as noted in the HPI and PMH.    Allergies  Review of patient's allergies indicates no known allergies.  Home Medications   Current Outpatient Rx  Name  Route  Sig  Dispense  Refill  . amLODipine (NORVASC) 10 MG tablet   Oral   Take 10 mg by mouth daily.         Marland Kitchen aspirin EC 81 MG tablet   Oral   Take 81 mg by mouth daily as needed (rapid heart beat).         Marland Kitchen atorvastatin (LIPITOR) 20 MG tablet   Oral   Take 20 mg by mouth every morning.         . budesonide-formoterol (SYMBICORT) 160-4.5 MCG/ACT inhaler   Inhalation   Inhale 2 puffs into the lungs daily as needed (shortness of breath).          Marland Kitchen glipiZIDE (GLUCOTROL XL) 10 MG 24 hr tablet   Oral   Take 10 mg by mouth 2 (two) times daily.          Marland Kitchen HYDROcodone-homatropine (HYCODAN) 5-1.5 MG/5ML syrup   Oral   Take 5 mLs by mouth every 4 (four) hours as needed for cough.         Marland Kitchen  insulin NPH-regular (NOVOLIN 70/30) (70-30) 100 UNIT/ML injection   Subcutaneous   Inject 30 Units into the skin 2 (two) times daily with a meal.         . metFORMIN (GLUCOPHAGE) 500 MG tablet   Oral   Take 1,000 mg by mouth 2 (two) times daily with a meal.         . quinapril-hydrochlorothiazide (ACCURETIC) 20-25 MG per tablet   Oral   Take 1 tablet by mouth daily.          BP 143/70  Pulse 86  Temp(Src) 98.1 F (36.7 C) (Oral)  Resp 15  Ht 5\' 4"  (1.626 m)  Wt 236 lb 3.2 oz (107.14 kg)  BMI 40.52 kg/m2  SpO2 99% Physical Exam  Constitutional: She is oriented to person, place, and time. She appears well-developed and well-nourished. No distress.  HENT:  Head: Normocephalic and atraumatic.  Mouth/Throat: Oropharynx is clear and moist. No oropharyngeal  exudate.  Eyes: Conjunctivae and EOM are normal. Pupils are equal, round, and reactive to light.  Neck: Normal range of motion. Neck supple.  Cardiovascular: Normal rate, regular rhythm and normal heart sounds.   No murmur heard. Pulmonary/Chest: Breath sounds normal. She exhibits no tenderness.  Abdominal: Soft. There is no tenderness. There is no rebound and no guarding.  Musculoskeletal: Normal range of motion. She exhibits no edema and no tenderness.  Neurological: She is alert and oriented to person, place, and time. No cranial nerve deficit. She exhibits normal muscle tone. Coordination normal.  CN 2-12 intact, no ataxia on finger to nose, no nystagmus, 5/5 strength throughout, no pronator drift, Romberg negative, normal gait.   Skin: Skin is warm.    ED Course  Procedures (including critical care time) Labs Review Labs Reviewed  BASIC METABOLIC PANEL - Abnormal; Notable for the following:    Sodium 136 (*)    Chloride 95 (*)    Glucose, Bld 421 (*)    GFR calc non Af Amer 80 (*)    All other components within normal limits  GLUCOSE, CAPILLARY - Abnormal; Notable for the following:    Glucose-Capillary 349 (*)    All other components within normal limits  POCT I-STAT, CHEM 8 - Abnormal; Notable for the following:    Glucose, Bld 332 (*)    All other components within normal limits  CBC  PRO B NATRIURETIC PEPTIDE  D-DIMER, QUANTITATIVE  TROPONIN I  POCT I-STAT TROPONIN I   Imaging Review Dg Chest 1 View  10/12/2013   CLINICAL DATA:  Palpitation with history of hypertension and diabetes  EXAM: CHEST - 1 VIEW  COMPARISON:  PA and lateral chest x-ray of December 04, 2003.  FINDINGS: The lungs are adequately inflated and clear. The cardiopericardial silhouette is normal in size. The pulmonary vascularity is not engorged. The mediastinum is normal in width. There is no pleural effusion or pneumothorax. The observed portions of the bony thorax appear normal.  IMPRESSION: There is  no evidence of CHF nor pneumonia nor other acute cardiopulmonary abnormality. I cannot exclude acute bronchitis in the appropriate clinical setting.   Electronically Signed   By: David  [place]   On: 10/12/2013 21:10    EKG Interpretation    Date/Time:  Tuesday October 12 2013 19:59:41 EST Ventricular Rate:  101 PR Interval:  168 QRS Duration: 88 QT Interval:  362 QTC Calculation: 469 R Axis:   -22 Text Interpretation:  Sinus tachycardia Left ventricular hypertrophy Abnormal ECG Rate faster Confirmed by [employer]  MD, Masiah Lewing [id number]) on 10/12/2013 10:48:32 PM            MDM   1. Palpitations   2. PVC's (premature ventricular contractions)    Palpitations over the past several days without chest pain or shortness of breath. She has some discomfort in her chest with the palpitations that only lasts a second or two. No anginal type chest pain. No chest pain with exertion. EKG with Normal sinus rhythm  Troponin negative x2. D-dimer negative. Chest x-ray negative. Hyperglycemia without evidence of DKA. Anion gap is 15 on recheck. Episodes of palpitations are described as lasting 1 to 2 seconds as a "heaviness with needing to catch her breath". He did not last any longer than 2 seconds. There is no radiation of the pain.   No arrhythmias seen in ED stay other than a few PVCs. Description is atypical for angina or PE. Given patient's risk factors of diabetes and hypertension she would benefit from stress testing anyway. She declines admission for observation. She understands that her presentation is atypical for ACS and her workup in ED is reassuring. She will follow up with her PCP. Return precautions discussed.  Ezequiel Essex, MD 10/13/13 [phone]

## 2013-10-12 NOTE — ED Notes (Signed)
Pt denies being around anyone who has been sick in the last couple of weeks. Pt states she hasn't been feeling her best.

## 2013-10-12 NOTE — ED Notes (Signed)
Patient states that she has had palpitations off and on for the last 2 days but it has gotten worse today.  +diet mountain dew drinker.

## 2013-10-13 LAB — POCT I-STAT, CHEM 8
BUN: 18 mg/dL (ref 6–23)
Calcium, Ion: 1.18 mmol/L (ref 1.12–1.23)
Chloride: 98 mEq/L (ref 96–112)
Creatinine, Ser: 0.9 mg/dL (ref 0.50–1.10)
GLUCOSE: 332 mg/dL — AB (ref 70–99)
HEMATOCRIT: 40 % (ref 36.0–46.0)
HEMOGLOBIN: 13.6 g/dL (ref 12.0–15.0)
POTASSIUM: 4.4 meq/L (ref 3.7–5.3)
Sodium: 139 mEq/L (ref 137–147)
TCO2: 26 mmol/L (ref 0–100)

## 2013-10-13 MED ORDER — SODIUM CHLORIDE 0.9 % IV BOLUS (SEPSIS)
1000.0000 mL | Freq: Once | INTRAVENOUS | Status: AC
  Administered 2013-10-13: 1000 mL via INTRAVENOUS

## 2013-10-13 NOTE — Discharge Instructions (Signed)
Palpitations  There is no evidence of heart attack or blood clot in the lung. Follow up with your doctor for a stress test. Return to the ED if you develop new or worsening symptoms. A palpitation is the feeling that your heartbeat is irregular or is faster than normal. It may feel like your heart is fluttering or skipping a beat. Palpitations are usually not a serious problem. However, in some cases, you may need further medical evaluation. CAUSES  Palpitations can be caused by:  Smoking.  Caffeine or other stimulants, such as diet pills or energy drinks.  Alcohol.  Stress and anxiety.  Strenuous physical activity.  Fatigue.  Certain medicines.  Heart disease, especially if you have a history of arrhythmias. This includes atrial fibrillation, atrial flutter, or supraventricular tachycardia.  An improperly working pacemaker or defibrillator. DIAGNOSIS  To find the cause of your palpitations, your caregiver will take your history and perform a physical exam. Tests may also be done, including:  Electrocardiography (ECG). This test records the heart's electrical activity.  Cardiac monitoring. This allows your caregiver to monitor your heart rate and rhythm in real time.  Holter monitor. This is a portable device that records your heartbeat and can help diagnose heart arrhythmias. It allows your caregiver to track your heart activity for several days, if needed.  Stress tests by exercise or by giving medicine that makes the heart beat faster. TREATMENT  Treatment of palpitations depends on the cause of your symptoms and can vary greatly. Most cases of palpitations do not require any treatment other than time, relaxation, and monitoring your symptoms. Other causes, such as atrial fibrillation, atrial flutter, or supraventricular tachycardia, usually require further treatment. HOME CARE INSTRUCTIONS   Avoid:  Caffeinated coffee, tea, soft drinks, diet pills, and energy  drinks.  Chocolate.  Alcohol.  Stop smoking if you smoke.  Reduce your stress and anxiety. Things that can help you relax include:  A method that measures bodily functions so you can learn to control them (biofeedback).  Yoga.  Meditation.  Physical activity such as swimming, jogging, or walking.  Get plenty of rest and sleep. SEEK MEDICAL CARE IF:   You continue to have a fast or irregular heartbeat beyond 24 hours.  Your palpitations occur more often. SEEK IMMEDIATE MEDICAL CARE IF:  You develop chest pain or shortness of breath.  You have a severe headache.  You feel dizzy, or you faint. MAKE SURE YOU:  Understand these instructions.  Will watch your condition.  Will get help right away if you are not doing well or get worse. Document Released: 09/20/2000 Document Revised: 01/18/2013 Document Reviewed: 11/22/2011 [facility] Patient Information 2014 [place].

## 2013-10-13 NOTE — ED Notes (Signed)
Pt A&Ox4, ambulatory at discharge. 

## 2014-01-04 ENCOUNTER — Institutional Professional Consult (permissible substitution): Payer: BC Managed Care – PPO | Admitting: Cardiology

## 2014-01-19 ENCOUNTER — Encounter: Payer: Self-pay | Admitting: Cardiology

## 2014-01-19 ENCOUNTER — Encounter: Payer: Self-pay | Admitting: General Surgery

## 2014-01-19 ENCOUNTER — Ambulatory Visit (INDEPENDENT_AMBULATORY_CARE_PROVIDER_SITE_OTHER): Payer: BC Managed Care – PPO | Admitting: Cardiology

## 2014-01-19 VITALS — BP 156/72 | HR 90 | Ht 69.0 in | Wt 239.0 lb

## 2014-01-19 DIAGNOSIS — I493 Ventricular premature depolarization: Secondary | ICD-10-CM

## 2014-01-19 DIAGNOSIS — I10 Essential (primary) hypertension: Secondary | ICD-10-CM

## 2014-01-19 DIAGNOSIS — R002 Palpitations: Secondary | ICD-10-CM

## 2014-01-19 DIAGNOSIS — E785 Hyperlipidemia, unspecified: Secondary | ICD-10-CM

## 2014-01-19 DIAGNOSIS — I4949 Other premature depolarization: Secondary | ICD-10-CM

## 2014-01-19 NOTE — Progress Notes (Signed)
[address], [place], [place]  [address] Phone: [phone] Fax:  [phone]  Date:  01/19/2014   ID:  Latasha [name], DOB [birth year], MRN [id number]  PCP:  Shirline Frees, MD  Cardiologist:  Latasha Him, MD     History of Present Illness: Latasha [name] is a 58 y.o. female with a history of DM, HTN and dyslipidemia presents for evaluation of palpitations.  She was seen in the ER in January 2015 with complaints of 2 days of intermittent palpitations that last for a few seconds at a time.  She denied any chest pain but stated that the palpitations take her breath away.  She was noted to have PVC's on heart monitor.  Of note she had drank some mountain Dew.  Her cardiac enzymes were negative. Since the ER visit she has had them 1-2 times but not as severe.  At this time she denies any chest pain She denies any SOB, DOE, LE edema or syncope.  She has had some dizziness associated with vertigo in the past.      Wt Readings from Last 3 Encounters:  01/19/14 239 lb (108.41 kg)  10/12/13 236 lb 3.2 oz (107.14 kg)  12/12/10 233 lb (105.688 kg)     Past Medical History  Diagnosis Date  . Diabetes mellitus   . Hypertension   . Hyperlipidemia   . BPPV (benign paroxysmal positional vertigo)   . Palpitations     Current Outpatient Prescriptions  Medication Sig Dispense Refill  . amLODipine (NORVASC) 10 MG tablet Take 10 mg by mouth daily.      Marland Kitchen aspirin EC 81 MG tablet Take 81 mg by mouth daily as needed (rapid heart beat).      Marland Kitchen glipiZIDE (GLUCOTROL XL) 10 MG 24 hr tablet Take 10 mg by mouth 2 (two) times daily.       . insulin NPH-regular (NOVOLIN 70/30) (70-30) 100 UNIT/ML injection Inject 30 Units into the skin 2 (two) times daily with a meal.      . metFORMIN (GLUCOPHAGE) 500 MG tablet Take 1,000 mg by mouth 2 (two) times daily with a meal.      . quinapril-hydrochlorothiazide (ACCURETIC) 20-25 MG per tablet Take 1 tablet by mouth daily.       No current  facility-administered medications for this visit.    Allergies:   No Known Allergies  Social History:  The patient  reports that she has never smoked. She does not have any smokeless tobacco history on file. She reports that she drinks alcohol. She reports that she does not use illicit drugs.   Family History:  The patient's family history includes Heart disease in her father; Hypertension in her father.   ROS:  Please see the history of present illness.      All other systems reviewed and negative.   PHYSICAL EXAM: VS:  BP 156/72  Pulse 90  Ht 5\' 9"  (1.753 m)  Wt 239 lb (108.41 kg)  BMI 35.28 kg/m2 Well nourished, well developed, in no acute distress HEENT: normal Neck: no JVD Cardiac:  normal S1, S2; RRR; no murmur Lungs:  clear to auscultation bilaterally, no wheezing, rhonchi or rales Abd: soft, nontender, no hepatomegaly Ext: no edema Skin: warm and dry Neuro:  CNs 2-12 intact, no focal abnormalities noted  EKG:  NSR with LVH by voltage and nonspecific ST abnormality and LAFB     ASSESSMENT AND PLAN:  1. Palpitations with PVC's noted on monitor in  ER in January - these seem to have settled down.   - event monitor to assess for any arrhythmias other than PVC's 2. LVH by EKG - check 2D echo to assess LVF and LVH 3. HTN - borderline control today but at home runs 140/82mmHg - continue amlodipine/accuretic 4. dyslipidemia 5.  PVC's - seem to have improved and may have been brought on by [facility] - If there is no arrhythmias on event monitor and echo is normal will not pursue any further workup since they were probably triggered by excessive caffeine intake  Followup with me pending results of studies  Signed, Latasha Him, MD 01/19/2014 3:46 PM

## 2014-01-19 NOTE — Patient Instructions (Signed)
Your physician recommends that you continue on your current medications as directed. Please refer to the Current Medication list given to you today.  Your physician has requested that you have an echocardiogram. Echocardiography is a painless test that uses sound waves to create images of your heart. It provides your doctor with information about the size and shape of your heart and how well your heart's chambers and valves are working. This procedure takes approximately one hour. There are no restrictions for this procedure.  Your physician has recommended that you wear an event monitor. Event monitors are medical devices that record the heart's electrical activity. Doctors most often [place] these monitors to diagnose arrhythmias. Arrhythmias are problems with the speed or rhythm of the heartbeat. The monitor is a small, portable device. You can wear one while you do your normal daily activities. This is usually used to diagnose what is causing palpitations/syncope (passing out).  Your physician recommends that you schedule a follow-up appointment As Needed

## 2014-01-27 ENCOUNTER — Ambulatory Visit (HOSPITAL_COMMUNITY): Payer: BC Managed Care – PPO | Attending: Internal Medicine | Admitting: Radiology

## 2014-01-27 ENCOUNTER — Encounter: Payer: Self-pay | Admitting: Internal Medicine

## 2014-01-27 ENCOUNTER — Encounter (INDEPENDENT_AMBULATORY_CARE_PROVIDER_SITE_OTHER): Payer: BC Managed Care – PPO

## 2014-01-27 ENCOUNTER — Encounter: Payer: Self-pay | Admitting: *Deleted

## 2014-01-27 DIAGNOSIS — R002 Palpitations: Secondary | ICD-10-CM

## 2014-01-27 DIAGNOSIS — I4949 Other premature depolarization: Secondary | ICD-10-CM | POA: Insufficient documentation

## 2014-01-27 DIAGNOSIS — I493 Ventricular premature depolarization: Secondary | ICD-10-CM

## 2014-01-27 NOTE — Progress Notes (Signed)
Patient ID: Latasha [name], female   DOB: 04-May-1956, 58 y.o.   MRN: [id number] [employer] 30 day cardiac event monitor applied to patient.

## 2014-01-27 NOTE — Progress Notes (Signed)
Echocardiogram performed.  

## 2014-01-31 ENCOUNTER — Telehealth: Payer: Self-pay | Admitting: Cardiology

## 2014-01-31 NOTE — Telephone Encounter (Signed)
Has she tried the sensitive electrodes

## 2014-01-31 NOTE — Telephone Encounter (Signed)
New message           Pt cannot wear heart monitor. Pt skin started to bleed she has red spots on her skin. Pt may be allergic.

## 2014-01-31 NOTE — Telephone Encounter (Signed)
To Dr Turner to advise 

## 2014-01-31 NOTE — Telephone Encounter (Signed)
To kate to verify pt used sensitive electrodes.

## 2014-02-02 NOTE — Telephone Encounter (Signed)
Left pt a voicemail asking if she called [facility] to get sensitive electrodes.

## 2014-03-01 ENCOUNTER — Telehealth: Payer: Self-pay | Admitting: Cardiology

## 2014-03-01 NOTE — Telephone Encounter (Signed)
LMTCB

## 2014-03-01 NOTE — Telephone Encounter (Signed)
Please let patient know that heart monitor showed normal rhythm with HR ranging from 82 to 107bpm.

## 2014-03-03 NOTE — Telephone Encounter (Signed)
Notified of heart monitor results.

## 2014-03-18 ENCOUNTER — Other Ambulatory Visit: Payer: Self-pay | Admitting: Family Medicine

## 2014-03-18 DIAGNOSIS — R1012 Left upper quadrant pain: Secondary | ICD-10-CM

## 2014-03-21 ENCOUNTER — Ambulatory Visit
Admission: RE | Admit: 2014-03-21 | Discharge: 2014-03-21 | Disposition: A | Discharge status: Home / Self Care | Payer: BC Managed Care – PPO | Source: Ambulatory Visit | Attending: Family Medicine | Admitting: Family Medicine

## 2014-03-21 DIAGNOSIS — R1012 Left upper quadrant pain: Secondary | ICD-10-CM

## 2014-03-21 MED ORDER — IOHEXOL 300 MG/ML  SOLN
125.0000 mL | Freq: Once | INTRAMUSCULAR | Status: AC | PRN
  Administered 2014-03-21: 125 mL via INTRAVENOUS

## 2014-05-05 ENCOUNTER — Other Ambulatory Visit: Payer: Self-pay

## 2014-05-09 ENCOUNTER — Other Ambulatory Visit: Payer: Self-pay | Admitting: Family Medicine

## 2014-05-09 DIAGNOSIS — N644 Mastodynia: Secondary | ICD-10-CM

## 2014-05-13 ENCOUNTER — Ambulatory Visit
Admission: RE | Admit: 2014-05-13 | Discharge: 2014-05-13 | Disposition: A | Discharge status: Home / Self Care | Payer: BC Managed Care – PPO | Source: Ambulatory Visit | Attending: Family Medicine | Admitting: Family Medicine

## 2014-05-13 ENCOUNTER — Encounter (INDEPENDENT_AMBULATORY_CARE_PROVIDER_SITE_OTHER): Payer: Self-pay

## 2014-05-13 DIAGNOSIS — N644 Mastodynia: Secondary | ICD-10-CM

## 2015-03-10 ENCOUNTER — Other Ambulatory Visit: Payer: Self-pay | Admitting: Family Medicine

## 2015-03-10 DIAGNOSIS — R911 Solitary pulmonary nodule: Secondary | ICD-10-CM

## 2015-03-14 ENCOUNTER — Other Ambulatory Visit: Payer: Self-pay

## 2015-04-27 ENCOUNTER — Ambulatory Visit
Admission: RE | Admit: 2015-04-27 | Discharge: 2015-04-27 | Disposition: A | Discharge status: Home / Self Care | Payer: 59 | Source: Ambulatory Visit | Attending: Family Medicine | Admitting: Family Medicine

## 2015-04-27 DIAGNOSIS — R911 Solitary pulmonary nodule: Secondary | ICD-10-CM

## 2015-04-27 MED ORDER — IOPAMIDOL (ISOVUE-300) INJECTION 61%
75.0000 mL | Freq: Once | INTRAVENOUS | Status: AC | PRN
  Administered 2015-04-27: 75 mL via INTRAVENOUS

## 2015-05-04 ENCOUNTER — Encounter (INDEPENDENT_AMBULATORY_CARE_PROVIDER_SITE_OTHER): Payer: 59 | Admitting: Ophthalmology

## 2015-05-04 DIAGNOSIS — E11331 Type 2 diabetes mellitus with moderate nonproliferative diabetic retinopathy with macular edema: Secondary | ICD-10-CM | POA: Diagnosis not present

## 2015-05-04 DIAGNOSIS — H35033 Hypertensive retinopathy, bilateral: Secondary | ICD-10-CM | POA: Diagnosis not present

## 2015-05-04 DIAGNOSIS — H43813 Vitreous degeneration, bilateral: Secondary | ICD-10-CM

## 2015-05-04 DIAGNOSIS — E11351 Type 2 diabetes mellitus with proliferative diabetic retinopathy with macular edema: Secondary | ICD-10-CM | POA: Diagnosis not present

## 2015-05-04 DIAGNOSIS — I10 Essential (primary) hypertension: Secondary | ICD-10-CM

## 2015-05-04 DIAGNOSIS — H35412 Lattice degeneration of retina, left eye: Secondary | ICD-10-CM | POA: Diagnosis not present

## 2015-05-04 DIAGNOSIS — E11311 Type 2 diabetes mellitus with unspecified diabetic retinopathy with macular edema: Secondary | ICD-10-CM

## 2015-06-08 ENCOUNTER — Encounter (INDEPENDENT_AMBULATORY_CARE_PROVIDER_SITE_OTHER): Payer: BLUE CROSS/BLUE SHIELD | Admitting: Ophthalmology

## 2015-06-08 DIAGNOSIS — E11351 Type 2 diabetes mellitus with proliferative diabetic retinopathy with macular edema: Secondary | ICD-10-CM

## 2015-06-08 DIAGNOSIS — E11311 Type 2 diabetes mellitus with unspecified diabetic retinopathy with macular edema: Secondary | ICD-10-CM | POA: Diagnosis not present

## 2015-10-16 ENCOUNTER — Ambulatory Visit (INDEPENDENT_AMBULATORY_CARE_PROVIDER_SITE_OTHER): Payer: BLUE CROSS/BLUE SHIELD | Admitting: Ophthalmology

## 2015-11-13 ENCOUNTER — Ambulatory Visit (INDEPENDENT_AMBULATORY_CARE_PROVIDER_SITE_OTHER): Payer: BLUE CROSS/BLUE SHIELD | Admitting: Ophthalmology

## 2015-11-18 DIAGNOSIS — R059 Cough, unspecified: Secondary | ICD-10-CM | POA: Insufficient documentation

## 2015-11-21 ENCOUNTER — Ambulatory Visit (INDEPENDENT_AMBULATORY_CARE_PROVIDER_SITE_OTHER): Payer: BLUE CROSS/BLUE SHIELD | Admitting: Ophthalmology

## 2015-12-28 ENCOUNTER — Other Ambulatory Visit: Payer: Self-pay | Admitting: Family Medicine

## 2015-12-28 DIAGNOSIS — R1010 Upper abdominal pain, unspecified: Secondary | ICD-10-CM

## 2016-01-02 ENCOUNTER — Ambulatory Visit
Admission: RE | Admit: 2016-01-02 | Discharge: 2016-01-02 | Disposition: A | Discharge status: Home / Self Care | Payer: BLUE CROSS/BLUE SHIELD | Source: Ambulatory Visit | Attending: Family Medicine | Admitting: Family Medicine

## 2016-01-02 DIAGNOSIS — R1010 Upper abdominal pain, unspecified: Secondary | ICD-10-CM

## 2016-01-18 ENCOUNTER — Encounter: Payer: Self-pay | Admitting: Neurology

## 2016-01-18 ENCOUNTER — Ambulatory Visit (INDEPENDENT_AMBULATORY_CARE_PROVIDER_SITE_OTHER): Payer: BLUE CROSS/BLUE SHIELD | Admitting: Neurology

## 2016-01-18 VITALS — HR 95 | Ht 69.0 in | Wt 230.5 lb

## 2016-01-18 DIAGNOSIS — I10 Essential (primary) hypertension: Secondary | ICD-10-CM | POA: Diagnosis not present

## 2016-01-18 DIAGNOSIS — H8112 Benign paroxysmal vertigo, left ear: Secondary | ICD-10-CM | POA: Diagnosis not present

## 2016-01-18 NOTE — Progress Notes (Signed)
Chart forwarded.  

## 2016-01-18 NOTE — Progress Notes (Signed)
NEUROLOGY CONSULTATION NOTE  [name] MRN: [id number] DOB: 1956/01/05  Referring provider: Dr. Kenton Kingfisher Primary care provider: Dr. Kenton Kingfisher  Reason for consult:  vertigo  HISTORY OF PRESENT ILLNESS: Latasha [name] is a 60 year old left-handed female with hypertension, hyperlipidemia, diabetes and PVCs who presents for vertigo.  History obtained by patient and PCP note.  She started experiencing dizziness and vertigo for about 3 years.  Initially, she experienced spinning sensation whenever she would roll over in bed.  Sometimes, she would wake up with the spinning, associated with nausea and vomiting.  The spinning usually lasts no more than a minute.  She also reports ringing in her left ear but denies hearing loss.    She also feels a different dizziness, more like a floating or lightheaded sensation.  It such cases, she feels off-balance.  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    She tried self-Epley maneuver which makes it worse.  She does have uncontrolled hypertension.  She has been evaluated for palpitations as well.  PAST MEDICAL HISTORY: Past Medical History  Diagnosis Date  . Diabetes mellitus   . Hypertension   . Hyperlipidemia   . BPPV (benign paroxysmal positional vertigo)   . Palpitations     PAST SURGICAL HISTORY: Past Surgical History  Procedure Laterality Date  . Abdominal hysterectomy    . Trigger finger release    . Knee surgery      MEDICATIONS: Current Outpatient Prescriptions on File Prior to Visit  Medication Sig Dispense Refill  . amLODipine (NORVASC) 10 MG tablet Take 10 mg by mouth daily.    Marland Kitchen aspirin EC 81 MG tablet Take 81 mg by mouth daily as needed (rapid heart beat).    Marland Kitchen glipiZIDE (GLUCOTROL XL) 10 MG 24 hr tablet Take 10 mg by mouth 2 (two) times daily.     . insulin NPH-regular (NOVOLIN 70/30) (70-30) 100 UNIT/ML injection Inject 30 Units into the skin 2 (two) times daily with a meal.     . metFORMIN (GLUCOPHAGE) 500 MG tablet Take 1,000 mg by mouth 2 (two) times daily with a meal.    . quinapril-hydrochlorothiazide (ACCURETIC) 20-25 MG per tablet Take 1 tablet by mouth daily.     No current facility-administered medications on file prior to visit.    ALLERGIES: No Known Allergies  FAMILY HISTORY: Family History  Problem Relation Age of Onset  . Heart disease Father   . Hypertension Father   . Alzheimer's disease Mother   . Dementia Other     SOCIAL HISTORY: Social History   Social History  . Marital Status: Married    Spouse Name: N/A  . Number of Children: N/A  . Years of Education: N/A   Occupational History  . Not on file.   Social History Main Topics  . Smoking status: Never Smoker   . Smokeless tobacco: Not on file  . Alcohol Use: Yes     Comment:  rare  . Drug Use: No  . Sexual Activity: Not on file   Other Topics Concern  . Not on file   Social History Narrative    REVIEW OF SYSTEMS: Constitutional: No fevers, chills, or sweats, no generalized fatigue, change in appetite Eyes: No visual changes, double vision, eye pain Ear, nose and throat: No hearing loss, ear pain, nasal congestion, sore throat Cardiovascular: No chest pain, palpitations Respiratory:  No shortness of breath at rest or with exertion, wheezes  GastrointestinaI: No nausea, vomiting, diarrhea, abdominal pain, fecal incontinence Genitourinary:  No dysuria, urinary retention or frequency Musculoskeletal:  No neck pain, back pain Integumentary: No rash, pruritus, skin lesions Neurological: as above Psychiatric: No depression, insomnia, anxiety Endocrine: No palpitations, fatigue, diaphoresis, mood swings, change in appetite, change in weight, increased thirst Hematologic/Lymphatic:  No anemia, purpura, petechiae. Allergic/Immunologic: no itchy/runny eyes, nasal congestion, recent allergic reactions, rashes  PHYSICAL EXAM: Filed Vitals:   01/18/16 1328  Pulse: 95    General: No acute distress.  Patient appears well-groomed.  Head:  Normocephalic/atraumatic Eyes:  fundi unremarkable, without vessel changes, exudates, hemorrhages or papilledema. Neck: supple, no paraspinal tenderness, full range of motion Back: No paraspinal tenderness Heart: regular rate and rhythm Lungs: Clear to auscultation bilaterally. Vascular: No carotid bruits. Neurological Exam: Mental status: alert and oriented to person, place, and time, recent and remote memory intact, fund of knowledge intact, attention and concentration intact, speech fluent and not dysarthric, language intact. Cranial nerves: CN I: not tested CN II: pupils equal, round and reactive to light, visual fields intact, fundi unremarkable, without vessel changes, exudates, hemorrhages or papilledema. CN III, IV, VI:  full range of motion, no nystagmus, no ptosis CN V: facial sensation intact CN VII: upper and lower face symmetric CN VIII: hearing intact CN IX, X: gag intact, uvula midline CN XI: sternocleidomastoid and trapezius muscles intact CN XII: tongue midline Bulk & Tone: normal, no fasciculations. Motor:  5/5 throughout  Sensation: temperature intact and vibration sensation decreased in feet. Deep Tendon Reflexes:  1+ throughout, toes downgoing. Finger to nose testing:  Without dysmetria.  Heel to shin:  Without dysmetria.  Gait:  Normal station and stride.  Able to turn but difficult with tandem walk. Romberg negative. Dix Hallpike maneuver positive on left Head Impulse test positive on left.  IMPRESSION: Benign paroxysmal positional vertigo on the left.  She exhibits no lateralizing symptoms.  Dix-Hallpike maneuver and Head Impulse Test are both positive on the left, indicating a peripheral etiology.  Repeat orthostatics were negative.  HTN  PLAN: 1.  Will refer to vestibular rehab 2.  Will have her follow up.  If not improved, consider further testing such as imaging. 3.  Follow up BP  with PCP  45 minutes spent face to face with patient, over 50% spent discussing diagnosis and management.  Thank you for allowing me to take part in the care of this patient.  Metta Clines, DO  CC:  Shirline Frees, MD

## 2016-01-18 NOTE — Patient Instructions (Addendum)
1.  I would like to refer you to physical therapy for vestibular rehabilitation. 2.  Follow up afterwards

## 2016-02-08 ENCOUNTER — Ambulatory Visit: Payer: BLUE CROSS/BLUE SHIELD | Attending: Neurology

## 2016-02-08 DIAGNOSIS — R42 Dizziness and giddiness: Secondary | ICD-10-CM | POA: Insufficient documentation

## 2016-02-08 DIAGNOSIS — R1011 Right upper quadrant pain: Secondary | ICD-10-CM | POA: Diagnosis not present

## 2016-02-08 DIAGNOSIS — R2689 Other abnormalities of gait and mobility: Secondary | ICD-10-CM | POA: Insufficient documentation

## 2016-02-08 NOTE — Therapy (Signed)
[facility] [address] [place], [place], [address] Phone: [phone]   Fax:  [phone]  Physical Therapy Evaluation  Patient Details  Name: Latasha [name] MRN: [id number] Date of Birth: 09/20/1956 Referring Provider: Dr. Tomi Likens  Encounter Date: 02/08/2016      PT End of Session - 02/08/16 1438    Visit Number 1   Number of Visits 9   Date for PT Re-Evaluation 03/09/16   Authorization Type BCBS   PT Start Time 1402   PT Stop Time 1445   PT Time Calculation (min) 43 min   Activity Tolerance Patient tolerated treatment well   Behavior During Therapy [facility] for tasks assessed/performed      Past Medical History  Diagnosis Date  . Diabetes mellitus   . Hypertension   . Hyperlipidemia   . BPPV (benign paroxysmal positional vertigo)   . Palpitations     Past Surgical History  Procedure Laterality Date  . Abdominal hysterectomy    . Trigger finger release    . Knee surgery      There were no vitals filed for this visit.       Subjective Assessment - 02/08/16 1408    Subjective Pt reported first episode occurred about a year ago when walking at night in her bedroom, she went to the ED was diagnosed with vertigo. She reports the dizziness is a spinning sensation, she's unable to drive during these times due to severity of dizziness. Pt reports dizziness incr. with head turns, so she slows her movements. 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 Pt is fearful of falling. Pt reported she had an [place] a few weeks ago and was told she has fatty deposits on liver.     Pertinent History HTN, PVC, DM type II, obesity, B cataracts   Patient Stated Goals To do away with vertigo for good and incr. endurance, walk more   Currently in Pain? Yes   Pain Score 4    Pain Location Abdomen   Pain Orientation Right   Pain  Descriptors / Indicators Discomfort  MD told pt he feels its bacteria in intestines.   Pain Type Chronic pain   Pain Onset More than a month ago   Pain Frequency Intermittent   Aggravating Factors  constipation   Pain Relieving Factors bowel movement            [facility] PT Assessment - 02/08/16 1419    Assessment   Medical Diagnosis BPPV   Referring Provider Dr. Tomi Likens   Onset Date/Surgical Date 02/08/15   Hand Dominance Left   Prior Therapy none for vertigo   Precautions   Precautions Fall   Restrictions   Weight Bearing Restrictions No   Balance Screen   Has the patient fallen in the past 6 months Yes   How many times? 1  approx. 1 month ago when walking in bedroom   Has the patient had a decrease in activity level because of a fear of falling?  Yes   Is the patient reluctant to leave their home because of a fear of falling?  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   Ambulation/Gait   Ambulation/Gait Yes   Ambulation/Gait Assistance 5: Supervision;4: Min guard   Ambulation/Gait Assistance Details  Min guard during turns. Pt amb. in guarded manner.   Ambulation Distance (Feet) 75 Feet   Assistive device None   Gait Pattern Step-through pattern;Decreased stride length;Decreased trunk rotation   Ambulation Surface Level;Indoor   Gait velocity 2.26ft/sec.            Vestibular Assessment - 02/08/16 1427    Symptom Behavior   Type of Dizziness Spinning  and wooziness   Frequency of Dizziness At least twice a week. Intensity at worst: 10/10, 0/10 at best.   Duration of Dizziness Spinning lasts: a couple minutes and then unsteadiness/wooziness/nausea lasts up to 1 hour.   Aggravating Factors Turning head quickly;Rolling to right;Rolling to left;Looking up to the ceiling;Supine to sit   Relieving Factors Rest;Slow movements   Occulomotor Exam   Occulomotor Alignment Normal   Spontaneous Absent   Gaze-induced Absent   Head shaking Horizontal Absent  pt reported R sided forehead pain.   Head Shaking Vertical Absent   Smooth Pursuits Comment   Saccades Intact   Comment Pt reported slight dizziness during L sides smooth pursuits. Pt reported uneasiness during B saccade testing.   Vestibulo-Occular Reflex   VOR 1 Head Only (x 1 viewing) WNL, but pt reported slight uneasiness.    Positional Testing   Dix-Hallpike Dix-Hallpike Right;Dix-Hallpike Left   Horizontal Canal Testing Horizontal Canal Right;Horizontal Canal Left   Dix-Hallpike Right   Dix-Hallpike Right Duration No nystagmus or dizziness but pt reported lightheadedness/wooziness   Dix-Hallpike Right Symptoms No nystagmus   Dix-Hallpike Left   Dix-Hallpike Left Duration No dizziness (spinning) but reported wooziness   Dix-Hallpike Left Symptoms No nystagmus   Horizontal Canal Right   Horizontal Canal Right Duration No dizziness but pt reported lightheadedness.   Horizontal Canal Right Symptoms Normal   Horizontal Canal Left   Horizontal Canal Left Duration No dizziness but reported wooziness.   Horizontal Canal Left  Symptoms Normal                       PT Education - 02/08/16 1437    Education provided Yes   Education Details PT discussed exam findings and PT duration/frequency.   Person(s) Educated Patient   Methods Explanation   Comprehension Verbalized understanding          PT Short Term Goals - 02/08/16 1707    PT SHORT TERM GOAL #1   Title Same as LTGs           PT Long Term Goals - 02/08/16 1707    PT LONG TERM GOAL #1   Title Pt will be IND in HEP to improve balance and decr. dizziness. Target  date: 03/07/16   Status New   PT LONG TERM GOAL #2   Title Pt will report dizziness </=2/10 during all activiites to improve safety during functional mobility. Target date: 03/07/16   Status New   PT LONG TERM GOAL #3   Title Perform SOT and write goal. Target date: 03/07/16   Status New   PT LONG TERM GOAL #4   Title Pt will improve gait speed >/=2.25ft/sec. without AD, IND, to safely amb. in the community. Target date: 03/07/16   Status New   PT LONG TERM GOAL #5   Title Pt will amb. 1000' over even/paved surfaces IND, while performing head turns, without LOB. Target date: 03/07/16   Status New               Plan - 02/08/16 1704    Clinical Impression Statement Pt is a pleasant 60y/o female presenting to OPPT neuro with dizziness. Pt's exam revealed the following deficits: dizziness, gait deviations, impaired balance, and decr. endurance. Positional testing negative for BPPV, as nystagmus not present and pt denied dizziness but did experience wooziness. PT will continue to monitor for BPPV. Pt's exam consistent with hypofunctioning of vestibular system, and based on pt's former "spinning" sensations it is likely pt experience vertigo and it has resolved, however, she limited her movement causing the vestibular hypofunctioning. PT will perform SOT next session.   Rehab Potential Good   Clinical Impairments Affecting Rehab Potential co-morbidities   PT Frequency 2x / week    PT Duration 4 weeks   PT Treatment/Interventions ADLs/Self Care Home Management;Biofeedback;Canalith Repostioning;Manual techniques;Vestibular;Therapeutic exercise;Balance training;Therapeutic activities;Functional mobility training;Stair training;Gait training;DME Instruction;Patient/family education;Neuromuscular re-education   PT Next Visit Plan Perform SOT and write goal, establish balance HEP   Consulted and Agree with Plan of Care Patient      Patient will benefit from skilled therapeutic intervention in order to improve the following deficits and impairments:  Abnormal gait, Dizziness, Decreased balance, Decreased mobility, Decreased activity tolerance, Obesity, Decreased knowledge of use of DME  Visit Diagnosis: Dizziness and giddiness - Plan: PT plan of care cert/re-cert  Other abnormalities of gait and mobility - Plan: PT plan of care cert/re-cert     Problem List Patient Active Problem List   Diagnosis Date Noted  . Hyperlipemia 01/19/2014  . Palpitations 01/19/2014  . PVC's (premature ventricular contractions) 01/19/2014  . SCIATICA, RIGHT 12/12/2010  . GASTROESOPHAGEAL REFLUX DISEASE 02/09/2010  . LEG CRAMPS 02/09/2010  . OBESITY 12/01/2009  . VITAMIN D DEFICIENCY 10/13/2009  . CONSTIPATION 10/13/2009  . LIVER FUNCTION TESTS, ABNORMAL, HX OF 10/13/2009  . HYPERTENSION 08/24/2009  . SHOULDER PAIN, RIGHT, CHRONIC 08/24/2009  . DIABETES MELLITUS, TYPE II 10/08/1991    Aithan Farrelly L 02/08/2016, 5:19 PM  [facility] [address] [place], [place], [address] Phone: [phone]   Fax:  [phone]  Name: Latasha [name] MRN: [id number] Date of Birth: 02/01/1956   Geoffry Paradise, PT,DPT 02/08/2016 5:19 PM Phone: [phone] Fax: [phone]

## 2016-02-15 ENCOUNTER — Ambulatory Visit: Payer: BLUE CROSS/BLUE SHIELD | Admitting: Physical Therapy

## 2016-02-27 ENCOUNTER — Ambulatory Visit: Payer: BLUE CROSS/BLUE SHIELD

## 2016-02-29 ENCOUNTER — Ambulatory Visit: Payer: BLUE CROSS/BLUE SHIELD | Admitting: Neurology

## 2016-03-05 ENCOUNTER — Ambulatory Visit: Payer: BLUE CROSS/BLUE SHIELD

## 2016-03-07 ENCOUNTER — Ambulatory Visit: Payer: BLUE CROSS/BLUE SHIELD | Attending: Neurology

## 2016-03-07 DIAGNOSIS — E113411 Type 2 diabetes mellitus with severe nonproliferative diabetic retinopathy with macular edema, right eye: Secondary | ICD-10-CM | POA: Diagnosis not present

## 2016-03-07 DIAGNOSIS — H25813 Combined forms of age-related cataract, bilateral: Secondary | ICD-10-CM | POA: Diagnosis not present

## 2016-03-12 ENCOUNTER — Ambulatory Visit: Payer: BLUE CROSS/BLUE SHIELD

## 2016-03-12 NOTE — Therapy (Signed)
[facility] [address] [place], [place], [address] Phone: [phone]   Fax:  [phone]  Patient Details  Name: Latasha [name] MRN: [id number] Date of Birth: 1956/05/12 Referring Provider:  No ref. provider found  Encounter Date: 03/12/2016  PHYSICAL THERAPY DISCHARGE SUMMARY  Visits from Start of Care: 1  Current functional level related to goals / functional outcomes:     PT Long Term Goals - 02/08/16 1707    PT LONG TERM GOAL #1   Title Pt will be IND in HEP to improve balance and decr. dizziness. Target date: 03/07/16   Status New   PT LONG TERM GOAL #2   Title Pt will report dizziness </=2/10 during all activiites to improve safety during functional mobility. Target date: 03/07/16   Status New   PT LONG TERM GOAL #3   Title Perform SOT and write goal. Target date: 03/07/16   Status New   PT LONG TERM GOAL #4   Title Pt will improve gait speed >/=2.43f/sec. without AD, IND, to safely amb. in the community. Target date: 03/07/16   Status New   PT LONG TERM GOAL #5   Title Pt will amb. 1000' over even/paved surfaces IND, while performing head turns, without LOB. Target date: 03/07/16   Status New        Remaining deficits: Unknown, as pt never returned after initial eval. PT called pt regarding missed appt's, and spoke with pt's spouse. He stated pt went back to work and that she'd call back. Pt then called front desk and cancelled one appt due to work conflict, but then no-showed for 3 appt's. Therefore, PT is discharging pt. She will require a new PT referral to resume therapy.   Education / Equipment: PT duration/frequency.  Plan: Patient agrees to discharge.  Patient goals were not met. Patient is being discharged due to not returning since the last visit.  ?????       [name],Latasha L 03/12/2016, 3:48 PM  [place] [place] [address]Phone: [phone]  Fax:  [phone]  JGeoffry Paradise PT,DPT 03/12/2016 3:48 PM Phone: [phone]: [phone]

## 2016-03-28 DIAGNOSIS — E1165 Type 2 diabetes mellitus with hyperglycemia: Secondary | ICD-10-CM | POA: Diagnosis not present

## 2016-03-28 DIAGNOSIS — E7800 Pure hypercholesterolemia, unspecified: Secondary | ICD-10-CM | POA: Diagnosis not present

## 2016-03-28 DIAGNOSIS — I10 Essential (primary) hypertension: Secondary | ICD-10-CM | POA: Diagnosis not present

## 2016-03-28 DIAGNOSIS — H8113 Benign paroxysmal vertigo, bilateral: Secondary | ICD-10-CM | POA: Diagnosis not present

## 2016-04-03 DIAGNOSIS — E113411 Type 2 diabetes mellitus with severe nonproliferative diabetic retinopathy with macular edema, right eye: Secondary | ICD-10-CM | POA: Diagnosis not present

## 2016-04-11 DIAGNOSIS — H2511 Age-related nuclear cataract, right eye: Secondary | ICD-10-CM | POA: Diagnosis not present

## 2016-04-11 DIAGNOSIS — H25811 Combined forms of age-related cataract, right eye: Secondary | ICD-10-CM | POA: Diagnosis not present

## 2016-04-16 DIAGNOSIS — E113512 Type 2 diabetes mellitus with proliferative diabetic retinopathy with macular edema, left eye: Secondary | ICD-10-CM | POA: Diagnosis not present

## 2016-05-14 DIAGNOSIS — E113512 Type 2 diabetes mellitus with proliferative diabetic retinopathy with macular edema, left eye: Secondary | ICD-10-CM | POA: Diagnosis not present

## 2016-05-26 ENCOUNTER — Emergency Department (HOSPITAL_COMMUNITY): Payer: BLUE CROSS/BLUE SHIELD

## 2016-05-26 ENCOUNTER — Encounter (HOSPITAL_COMMUNITY): Payer: Self-pay

## 2016-05-26 ENCOUNTER — Emergency Department (HOSPITAL_COMMUNITY)
Admission: EM | Admit: 2016-05-26 | Discharge: 2016-05-26 | Disposition: A | Discharge status: Home / Self Care | Payer: BLUE CROSS/BLUE SHIELD | Attending: Emergency Medicine | Admitting: Emergency Medicine

## 2016-05-26 DIAGNOSIS — R0602 Shortness of breath: Secondary | ICD-10-CM | POA: Diagnosis not present

## 2016-05-26 DIAGNOSIS — Z7984 Long term (current) use of oral hypoglycemic drugs: Secondary | ICD-10-CM | POA: Insufficient documentation

## 2016-05-26 DIAGNOSIS — Z79899 Other long term (current) drug therapy: Secondary | ICD-10-CM | POA: Insufficient documentation

## 2016-05-26 DIAGNOSIS — Z7982 Long term (current) use of aspirin: Secondary | ICD-10-CM | POA: Insufficient documentation

## 2016-05-26 DIAGNOSIS — I493 Ventricular premature depolarization: Secondary | ICD-10-CM | POA: Insufficient documentation

## 2016-05-26 DIAGNOSIS — Z794 Long term (current) use of insulin: Secondary | ICD-10-CM | POA: Insufficient documentation

## 2016-05-26 DIAGNOSIS — E119 Type 2 diabetes mellitus without complications: Secondary | ICD-10-CM | POA: Diagnosis not present

## 2016-05-26 DIAGNOSIS — I10 Essential (primary) hypertension: Secondary | ICD-10-CM | POA: Diagnosis not present

## 2016-05-26 DIAGNOSIS — R002 Palpitations: Secondary | ICD-10-CM | POA: Diagnosis not present

## 2016-05-26 LAB — URINALYSIS, ROUTINE W REFLEX MICROSCOPIC
Bilirubin Urine: NEGATIVE
Glucose, UA: >1000 mg/dL — AB
KETONES UR: NEGATIVE mg/dL
LEUKOCYTES UA: NEGATIVE
NITRITE: NEGATIVE
PH: 6.5 (ref 5.0–8.0)
Protein, ur: >300 mg/dL — AB
SPECIFIC GRAVITY, URINE: 1.022 (ref 1.005–1.030)

## 2016-05-26 LAB — I-STAT TROPONIN, ED
TROPONIN I, POC: 0.01 ng/mL (ref 0.00–0.08)
Troponin i, poc: 0.01 ng/mL (ref 0.00–0.08)

## 2016-05-26 LAB — BASIC METABOLIC PANEL
ANION GAP: 6 (ref 5–15)
BUN: 14 mg/dL (ref 6–20)
CO2: 28 mmol/L (ref 22–32)
Calcium: 8.4 mg/dL — ABNORMAL LOW (ref 8.9–10.3)
Chloride: 102 mmol/L (ref 101–111)
Creatinine, Ser: 1.02 mg/dL — ABNORMAL HIGH (ref 0.44–1.00)
GFR calc Af Amer: >60 mL/min (ref >60)
GFR calc non Af Amer: 59 mL/min — ABNORMAL LOW (ref >60)
GLUCOSE: 308 mg/dL — AB (ref 65–99)
POTASSIUM: 3.3 mmol/L — AB (ref 3.5–5.1)
Sodium: 136 mmol/L (ref 135–145)

## 2016-05-26 LAB — URINE MICROSCOPIC-ADD ON

## 2016-05-26 LAB — CBC
HEMATOCRIT: 36.4 % (ref 36.0–46.0)
HEMOGLOBIN: 12.2 g/dL (ref 12.0–15.0)
MCH: 29 pg (ref 26.0–34.0)
MCHC: 33.5 g/dL (ref 30.0–36.0)
MCV: 86.5 fL (ref 78.0–100.0)
Platelets: 282 10*3/uL (ref 150–400)
RBC: 4.21 MIL/uL (ref 3.87–5.11)
RDW: 12.5 % (ref 11.5–15.5)
WBC: 6.1 10*3/uL (ref 4.0–10.5)

## 2016-05-26 LAB — BRAIN NATRIURETIC PEPTIDE: B Natriuretic Peptide: 56.3 pg/mL (ref 0.0–100.0)

## 2016-05-26 MED ORDER — ONDANSETRON HCL 4 MG/2ML IJ SOLN
4.0000 mg | Freq: Once | INTRAMUSCULAR | Status: AC
  Administered 2016-05-26: 4 mg via INTRAVENOUS
  Filled 2016-05-26: qty 2

## 2016-05-26 MED ORDER — ACETAMINOPHEN 325 MG PO TABS
650.0000 mg | ORAL_TABLET | ORAL | Status: DC | PRN
  Administered 2016-05-26: 650 mg via ORAL
  Filled 2016-05-26: qty 2

## 2016-05-26 MED ORDER — METOPROLOL SUCCINATE ER 25 MG PO TB24: 25.0000 mg | ORAL_TABLET | Freq: Every day | ORAL | 0 refills | Status: DC

## 2016-05-26 MED ORDER — LABETALOL HCL 5 MG/ML IV SOLN
10.0000 mg | Freq: Once | INTRAVENOUS | Status: AC
  Administered 2016-05-26: 10 mg via INTRAVENOUS
  Filled 2016-05-26: qty 4

## 2016-05-26 NOTE — ED Notes (Signed)
Dr. Lita Mains MD at bedside.

## 2016-05-26 NOTE — ED Provider Notes (Signed)
[facility] DEPT Provider Note   CSN: [id number] Arrival date & time: 05/26/16  [id number]     History   Chief Complaint Chief Complaint  Patient presents with  . Palpitations    HPI Latasha [name] is a 60 y.o. female.  HPI Patient presents with 2 days of intermittent palpitations. Describes the events as racing and irregular. She's had a mild nonproductive cough over this time.. She denies drinking any caffeinated drinks. States he's been drinking plenty of water. She has noticed increased swelling in bilateral lower extremities. She states she stands all day at work and this is worse at the end of her day. She's had some mild shortness of breath mostly with exertion. Denies any fever or chills. No chest pain. She's run out of her Benicar but taking her other medications as prescribed. Past Medical History:  Diagnosis Date  . BPPV (benign paroxysmal positional vertigo)   . Diabetes mellitus   . Hyperlipidemia   . Hypertension   . Palpitations     Patient Active Problem List   Diagnosis Date Noted  . Hyperlipemia 01/19/2014  . Palpitations 01/19/2014  . PVC's (premature ventricular contractions) 01/19/2014  . SCIATICA, RIGHT 12/12/2010  . GASTROESOPHAGEAL REFLUX DISEASE 02/09/2010  . LEG CRAMPS 02/09/2010  . OBESITY 12/01/2009  . VITAMIN D DEFICIENCY 10/13/2009  . CONSTIPATION 10/13/2009  . LIVER FUNCTION TESTS, ABNORMAL, HX OF 10/13/2009  . HYPERTENSION 08/24/2009  . SHOULDER PAIN, RIGHT, CHRONIC 08/24/2009  . DIABETES MELLITUS, TYPE II 10/08/1991    Past Surgical History:  Procedure Laterality Date  . ABDOMINAL HYSTERECTOMY    . KNEE SURGERY    . TRIGGER FINGER RELEASE      OB History    No data available       Home Medications    Prior to Admission medications   Medication Sig Start Date End Date Taking? Authorizing Provider  amLODipine (NORVASC) 10 MG tablet Take 10 mg by mouth daily. Reported on 02/08/2016   Yes Historical Provider, MD  aspirin EC 81 MG  tablet Take 81 mg by mouth daily as needed (rapid heart beat).   Yes Historical Provider, MD  furosemide (LASIX) 20 MG tablet Take 20 mg by mouth 2 (two) times daily.    Yes Historical Provider, MD  glipiZIDE (GLUCOTROL XL) 10 MG 24 hr tablet Take 10 mg by mouth 2 (two) times daily.    Yes Historical Provider, MD  Insulin Glargine (TOUJEO SOLOSTAR) 300 UNIT/ML SOPN Inject 64 Units into the skin every morning.   Yes Historical Provider, MD  metFORMIN (GLUCOPHAGE) 1000 MG tablet Take 1 tablet by mouth 2 (two) times daily. 04/26/16  Yes Historical Provider, MD  olmesartan (BENICAR) 40 MG tablet Take 40 mg by mouth daily.   Yes Historical Provider, MD  quinapril-hydrochlorothiazide (ACCURETIC) 20-25 MG per tablet Take 1 tablet by mouth daily. Reported on 02/08/2016   Yes Historical Provider, MD  rosuvastatin (CRESTOR) 10 MG tablet Take 10 mg by mouth daily.   Yes Historical Provider, MD  metoprolol succinate (TOPROL-XL) 25 MG 24 hr tablet Take 1 tablet (25 mg total) by mouth daily. 05/26/16   Julianne Rice, MD    Family History Family History  Problem Relation Age of Onset  . Heart disease Father   . Hypertension Father   . Alzheimer's disease Mother   . Dementia Other     Social History Social History  Substance Use Topics  . Smoking status: Never Smoker  . Smokeless tobacco: Never Used  .  Alcohol use Yes     Comment:  rare     Allergies   Review of patient's allergies indicates no known allergies.   Review of Systems Review of Systems  Constitutional: Negative for chills and fever.  Respiratory: Positive for cough and shortness of breath. Negative for wheezing.   Cardiovascular: Positive for palpitations and leg swelling. Negative for chest pain.  Gastrointestinal: Negative for abdominal pain, constipation, diarrhea, nausea and vomiting.  Genitourinary: Negative for dysuria and flank pain.  Musculoskeletal: Negative for back pain, myalgias, neck pain and neck stiffness.  Skin:  Negative for rash.  Neurological: Negative for dizziness, facial asymmetry, weakness, light-headedness, numbness and headaches.  All other systems reviewed and are negative.    Physical Exam Updated Vital Signs BP 148/69   Pulse 72   Temp 98 F (36.7 C) (Oral)   Resp 14   Ht 5\' 4"  (1.626 m)   Wt 237 lb (107.5 kg)   SpO2 98%   BMI 40.68 kg/m   Physical Exam  Constitutional: She is oriented to person, place, and time. She appears well-developed and well-nourished.  HENT:  Head: Normocephalic and atraumatic.  Mouth/Throat: Oropharynx is clear and moist.  Eyes: EOM are normal. Pupils are equal, round, and reactive to light.  Neck: Normal range of motion. Neck supple.  Cardiovascular: Normal rate and regular rhythm.  Exam reveals no gallop and no friction rub.   No murmur heard. Pulmonary/Chest: Effort normal and breath sounds normal. No respiratory distress. She has no wheezes. She has no rales. She exhibits no tenderness.  Abdominal: Soft. Bowel sounds are normal. There is no tenderness. There is no rebound and no guarding.  Musculoskeletal: Normal range of motion. She exhibits edema (1+ lower extremity pitting edema). She exhibits no tenderness or deformity.  Neurological: She is alert and oriented to person, place, and time.  Moves all extremities without deficit. Sensation intact.  Skin: Skin is warm and dry. No rash noted. No erythema.  Psychiatric: She has a normal mood and affect. Her behavior is normal.  Nursing note and vitals reviewed.    ED Treatments / Results  Labs (all labs ordered are listed, but only abnormal results are displayed) Labs Reviewed  BASIC METABOLIC PANEL - Abnormal; Notable for the following:       Result Value   Potassium 3.3 (*)    Glucose, Bld 308 (*)    Creatinine, Ser 1.02 (*)    Calcium 8.4 (*)    GFR calc non Af Amer 59 (*)    All other components within normal limits  URINALYSIS, ROUTINE W REFLEX MICROSCOPIC (NOT AT [facility]) -  Abnormal; Notable for the following:    Glucose, UA >1000 (*)    Hgb urine dipstick SMALL (*)    Protein, ur >300 (*)    All other components within normal limits  URINE MICROSCOPIC-ADD ON - Abnormal; Notable for the following:    Squamous Epithelial / LPF 6-30 (*)    Bacteria, UA FEW (*)    All other components within normal limits  CBC  BRAIN NATRIURETIC PEPTIDE  I-STAT TROPOININ, ED  I-STAT TROPOININ, ED    EKG  EKG Interpretation  Date/Time:  Sunday May 26 2016 08:56:07 EDT Ventricular Rate:  81 PR Interval:  158 QRS Duration: 92 QT Interval:  390 QTC Calculation: 453 R Axis:   -17 Text Interpretation:  Normal sinus rhythm Minimal voltage criteria for LVH, may be normal variant Nonspecific T wave abnormality Abnormal ECG Confirmed by Lita Mains  MD, Mumin Denomme ([id number]) on 05/26/2016 9:36:40 AM       Radiology Dg Chest 2 View  Result Date: 05/26/2016 CLINICAL DATA:  Cardiac palpitations EXAM: CHEST  2 VIEW COMPARISON:  Chest radiograph October 12, 2013 and chest CT April 27, 2015 FINDINGS: There is no edema or consolidation. The heart size and pulmonary vascularity are normal. No adenopathy. No bone lesions. IMPRESSION: No edema or consolidation. Electronically Signed   By: Lowella Grip III M.D.   On: 05/26/2016 09:36    Procedures Procedures (including critical care time)  Medications Ordered in ED Medications  acetaminophen (TYLENOL) tablet 650 mg (650 mg Oral Given 05/26/16 1145)  labetalol (NORMODYNE,TRANDATE) injection 10 mg (10 mg Intravenous Given 05/26/16 1146)  ondansetron (ZOFRAN) injection 4 mg (4 mg Intravenous Given 05/26/16 1145)     Initial Impression / Assessment and Plan / ED Course  I have reviewed the triage vital signs and the nursing notes.  Pertinent labs & imaging results that were available during my care of the patient were reviewed by me and considered in my medical decision making (see chart for details).  Clinical Course   Patient's  experience of palpitations corresponds to frequent PVCs. Has been worked up for palpitations 2 years ago by Dr. Radford Pax. 2-D echo and Holter monitor testing with no concerning findings. Thought at that time that her symptomatic PVC's may be due to increased caffeine intake.  Headache and blood pressure responded well to single dose of labetalol. Continues to have frequent PVCs. Denies chest pain. Troponin 2 is normal. Discussed with Dr. Marlou Porch. Recommended starting 25 mg of metoprolol extended release daily. Advised to follow-up with him as an outpatient. She has been given return precautions and is voiced understanding. Final Clinical Impressions(s) / ED Diagnoses   Final diagnoses:  Symptomatic PVCs  Essential hypertension    New Prescriptions Discharge Medication List as of 05/26/2016  2:13 PM    START taking these medications   Details  metoprolol succinate (TOPROL-XL) 25 MG 24 hr tablet Take 1 tablet (25 mg total) by mouth daily., Starting Sun 05/26/2016, Print         Julianne Rice, MD 05/26/16 [phone]

## 2016-05-26 NOTE — ED Notes (Signed)
Patient bigeminy PVC on monitor. Rhythm strip shown to EDP and new EKG verbal order.

## 2016-05-26 NOTE — ED Triage Notes (Signed)
Patient here with 1 day of heart palpitations. States she has had a mild headache and bilateral arm tingling with same. Denies CP, denies shortness of breath. Alert and oriented

## 2016-05-31 ENCOUNTER — Telehealth: Payer: Self-pay

## 2016-05-31 NOTE — Telephone Encounter (Signed)
Called patient to scheduled follow-up with Dr. Radford Pax 9/6 at 0945, but she is scheduled at [facility] with Dr. [name] 8/30 at 0730. Called to clarify if she wants to switch doctors or cancel OV with Dr. [name] and schedule with Dr. Radford Pax.  Left message to call back.

## 2016-05-31 NOTE — Telephone Encounter (Signed)
-----   Message from Sueanne Margarita, MD sent at 05/26/2016  8:23 PM EDT ----- Regarding: RE: PVC's [place] - please try to work her in in the next few weeks  Traci ----- Message ----- From: Jerline Pain, MD Sent: 05/26/2016   1:48 PM To: Sueanne Margarita, MD, Theodoro Parma, RN Subject: PVC's                                          Came to ER with Palps. PVC's noted. Bigeminy at times. Similar to 2015 visit.   Asked him to start metoprolol ER 25 QD.   Please see if you can get her into clinic in the next few weeks.   Thanks.  Candee Furbish, MD

## 2016-05-31 NOTE — Telephone Encounter (Signed)
Left message to call back  

## 2016-06-05 ENCOUNTER — Encounter: Payer: Self-pay | Admitting: Cardiovascular Disease

## 2016-06-05 ENCOUNTER — Ambulatory Visit (INDEPENDENT_AMBULATORY_CARE_PROVIDER_SITE_OTHER): Payer: BLUE CROSS/BLUE SHIELD | Admitting: Cardiovascular Disease

## 2016-06-05 DIAGNOSIS — E785 Hyperlipidemia, unspecified: Secondary | ICD-10-CM

## 2016-06-05 DIAGNOSIS — I10 Essential (primary) hypertension: Secondary | ICD-10-CM | POA: Diagnosis not present

## 2016-06-05 DIAGNOSIS — R002 Palpitations: Secondary | ICD-10-CM | POA: Diagnosis not present

## 2016-06-05 NOTE — Assessment & Plan Note (Signed)
Latasha [name] was referred by the emergency room for evaluation of palpitations. She was just seen there on 05/26/16. She had 4 days of off and on palpitations. Her workup in the emergency room was unremarkable. She has been off of caffeine for 3 weeks and this may represent caffeine withdrawal but she also admits to a lot of stress in her life as well both at work and at home. She has been worked up 2 years ago by Dr. Radford Pax for palpitations and had a 2-D echocardiogram which was normal and a Holter monitor that showed PVCs. Her palpitations have since resolved .

## 2016-06-05 NOTE — Progress Notes (Signed)
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 She was referred by the [facility] emergency room (Dr. Julianne Rice) for evaluation of palpitations. She has a history of hypertension, diabetes and hyperlipidemia. Her father did die at age 35 of a myocardial infarction. She has never had a heart attack or stroke. She denies chest pain but has had some dyspnea on exertion which she attributes increasing weight related to dietary indiscretion and inactivity. She does suffer from chronic vertigo. She stopped ingesting caffeine approximately 3 weeks ago. She does admit to having a lot of stress in her life both at home and at work. On 05/26/16 she was seen in the emergency room with palpitations. Her workup was negative. Interestingly, 2 years ago she was worked up by Dr. Fransico Him who did a 2-D echo that was normal and a Holter monitor that showed PVCs. Her palpitations resolved after 4 days   Current Outpatient Prescriptions  Medication Sig Dispense Refill  . amLODipine (NORVASC) 10 MG tablet Take 10 mg by mouth daily. Reported on 02/08/2016    . aspirin EC 81 MG tablet Take 81 mg by mouth daily as needed (rapid heart beat).    . furosemide (LASIX) 20 MG tablet Take 20 mg by mouth 2 (two) times daily.     Marland Kitchen glipiZIDE (GLUCOTROL XL) 10 MG 24 hr tablet Take 10 mg by mouth 2 (two) times daily.     . Insulin Glargine (TOUJEO SOLOSTAR) 300 UNIT/ML SOPN Inject 64 Units into the skin every morning.    . metFORMIN (GLUCOPHAGE) 1000 MG tablet Take 1 tablet by mouth 2 (two) times daily.    . metoprolol succinate (TOPROL-XL) 25 MG 24 hr tablet Take 1 tablet (25 mg total) by mouth daily. 30 tablet 0  .  quinapril-hydrochlorothiazide (ACCURETIC) 20-25 MG per tablet Take 1 tablet by mouth daily. Reported on 02/08/2016    . rosuvastatin (CRESTOR) 10 MG tablet Take 10 mg by mouth daily.    Marland Kitchen olmesartan (BENICAR) 40 MG tablet Take 40 mg by mouth daily.     No current facility-administered medications for this visit.     No Known Allergies  Social History   Social History  . Marital status: Married    Spouse name: N/A  . Number of children: N/A  . Years of education: N/A   Occupational History  . Not on file.   Social History Main Topics  . Smoking status: Never Smoker  . Smokeless tobacco: Never Used  . Alcohol use Yes     Comment:  rare  . Drug use: No  . Sexual activity: Not on file   Other Topics Concern  . Not on file   Social History Narrative  . No narrative on file     Review of Systems: General: negative for chills, fever, night sweats or weight changes.  Cardiovascular: negative for chest pain, dyspnea on exertion, edema, orthopnea, palpitations, paroxysmal nocturnal dyspnea or shortness of breath Dermatological: negative for rash Respiratory: negative for cough or wheezing Urologic: negative for hematuria Abdominal: negative for nausea, vomiting, diarrhea, bright red blood per rectum, melena, or hematemesis Neurologic: negative for visual changes, syncope, or dizziness All other systems reviewed and are otherwise negative  except as noted above.    Blood pressure (!) 165/99, pulse 70, height 5\' 5"  (1.651 m), weight 245 lb 3.2 oz (111.2 kg).  General appearance: alert and no distress Neck: no adenopathy, no carotid bruit, no JVD, supple, symmetrical, trachea midline and thyroid not enlarged, symmetric, no tenderness/mass/nodules Lungs: clear to auscultation bilaterally Heart: regular rate and rhythm, S1, S2 normal, no murmur, click, rub or gallop Extremities: extremities normal, atraumatic, no cyanosis or edema  EKG not performed today  ASSESSMENT AND PLAN:     Essential hypertension History of hypertension blood pressure measured today at 165/99. She is on amlodipine, metoprolol, Benicar and Accuretic. Continue current meds at current dosing  Hyperlipemia History of hyperlipidemia on statin therapy followed by her PCP  Palpitations Latasha [name] was referred by the emergency room for evaluation of palpitations. She was just seen there on 05/26/16. She had 4 days of off and on palpitations. Her workup in the emergency room was unremarkable. She has been off of caffeine for 3 weeks and this may represent caffeine withdrawal but she also admits to a lot of stress in her life as well both at work and at home. She has been worked up 2 years ago by Dr. Radford Pax for palpitations and had a 2-D echocardiogram which was normal and a Holter monitor that showed PVCs. Her palpitations have since resolved .      Latasha Harp MD FACP,FACC,FAHA, [facility] 06/05/2016 7:53 AM

## 2016-06-05 NOTE — Assessment & Plan Note (Signed)
History of hypertension blood pressure measured today at 165/99. She is on amlodipine, metoprolol, Benicar and Accuretic. Continue current meds at current dosing

## 2016-06-05 NOTE — Patient Instructions (Signed)
Medication Instructions:  Your physician recommends that you continue on your current medications as directed. Please refer to the Current Medication list given to you today.   Follow-Up: Your physician recommends that you schedule a follow-up appointment ON AN AS NEEDED BASIS.  If you need a refill on your cardiac medications before your next appointment, please call your pharmacy.   

## 2016-06-05 NOTE — Telephone Encounter (Signed)
Patient was seen today by Dr. [name].

## 2016-06-05 NOTE — Assessment & Plan Note (Signed)
History of hyperlipidemia on statin therapy followed by her PCP. 

## 2016-06-06 ENCOUNTER — Telehealth: Payer: Self-pay | Admitting: *Deleted

## 2016-06-06 NOTE — Telephone Encounter (Signed)
OK to hold ASA for cataract surg  JJB

## 2016-06-06 NOTE — Telephone Encounter (Signed)
Requesting surgical clearance:   1. Type of surgery: Complex cataract extraction by PE,IOL-Left under IV sedation  2. Surgeon: Dr Tama High  3. Surgical date: to be determined  4. Medications that need to be held: ASA  5. CAD: No     6. I will defer to: Dr [facility] Attn: Nobie Putnam Fax- [phone] Phone- [phone]

## 2016-06-06 NOTE — Telephone Encounter (Signed)
Routed to number provided via EPIC. 

## 2016-06-12 NOTE — Telephone Encounter (Signed)
Received second request for clearance for patient from [facility].  Patient cleared at low cardiovascular risk and may hold aspirin for 7 days prior to procedure per Dr [name].  Encounter routed to [phone].

## 2016-06-25 DIAGNOSIS — E113512 Type 2 diabetes mellitus with proliferative diabetic retinopathy with macular edema, left eye: Secondary | ICD-10-CM | POA: Diagnosis not present

## 2016-06-26 DIAGNOSIS — H25812 Combined forms of age-related cataract, left eye: Secondary | ICD-10-CM | POA: Diagnosis not present

## 2016-06-26 DIAGNOSIS — H2512 Age-related nuclear cataract, left eye: Secondary | ICD-10-CM | POA: Diagnosis not present

## 2016-06-27 DIAGNOSIS — H8113 Benign paroxysmal vertigo, bilateral: Secondary | ICD-10-CM | POA: Diagnosis not present

## 2016-06-27 DIAGNOSIS — E113511 Type 2 diabetes mellitus with proliferative diabetic retinopathy with macular edema, right eye: Secondary | ICD-10-CM | POA: Diagnosis not present

## 2016-06-27 DIAGNOSIS — I10 Essential (primary) hypertension: Secondary | ICD-10-CM | POA: Diagnosis not present

## 2016-06-27 DIAGNOSIS — E1165 Type 2 diabetes mellitus with hyperglycemia: Secondary | ICD-10-CM | POA: Diagnosis not present

## 2016-06-27 DIAGNOSIS — E7800 Pure hypercholesterolemia, unspecified: Secondary | ICD-10-CM | POA: Diagnosis not present

## 2016-06-28 DIAGNOSIS — E113511 Type 2 diabetes mellitus with proliferative diabetic retinopathy with macular edema, right eye: Secondary | ICD-10-CM | POA: Diagnosis not present

## 2016-07-13 ENCOUNTER — Emergency Department (HOSPITAL_COMMUNITY)
Admission: EM | Admit: 2016-07-13 | Discharge: 2016-07-13 | Disposition: A | Discharge status: Home / Self Care | Payer: BLUE CROSS/BLUE SHIELD | Source: Home / Self Care | Attending: Emergency Medicine | Admitting: Emergency Medicine

## 2016-07-13 ENCOUNTER — Encounter (HOSPITAL_COMMUNITY): Payer: Self-pay

## 2016-07-13 ENCOUNTER — Encounter (HOSPITAL_COMMUNITY): Payer: Self-pay | Admitting: Oncology

## 2016-07-13 ENCOUNTER — Emergency Department (HOSPITAL_COMMUNITY)
Admission: EM | Admit: 2016-07-13 | Discharge: 2016-07-13 | Disposition: A | Discharge status: Home / Self Care | Payer: BLUE CROSS/BLUE SHIELD | Attending: Emergency Medicine | Admitting: Emergency Medicine

## 2016-07-13 ENCOUNTER — Emergency Department (HOSPITAL_COMMUNITY): Payer: BLUE CROSS/BLUE SHIELD

## 2016-07-13 DIAGNOSIS — I10 Essential (primary) hypertension: Secondary | ICD-10-CM | POA: Insufficient documentation

## 2016-07-13 DIAGNOSIS — Z7984 Long term (current) use of oral hypoglycemic drugs: Secondary | ICD-10-CM | POA: Diagnosis not present

## 2016-07-13 DIAGNOSIS — Z794 Long term (current) use of insulin: Secondary | ICD-10-CM

## 2016-07-13 DIAGNOSIS — Z79899 Other long term (current) drug therapy: Secondary | ICD-10-CM

## 2016-07-13 DIAGNOSIS — E119 Type 2 diabetes mellitus without complications: Secondary | ICD-10-CM

## 2016-07-13 DIAGNOSIS — Z7982 Long term (current) use of aspirin: Secondary | ICD-10-CM | POA: Insufficient documentation

## 2016-07-13 DIAGNOSIS — R1032 Left lower quadrant pain: Secondary | ICD-10-CM | POA: Diagnosis not present

## 2016-07-13 DIAGNOSIS — R109 Unspecified abdominal pain: Secondary | ICD-10-CM

## 2016-07-13 LAB — BASIC METABOLIC PANEL
ANION GAP: 8 (ref 5–15)
BUN: 14 mg/dL (ref 6–20)
CALCIUM: 8.4 mg/dL — AB (ref 8.9–10.3)
CO2: 29 mmol/L (ref 22–32)
CREATININE: 1.03 mg/dL — AB (ref 0.44–1.00)
Chloride: 100 mmol/L — ABNORMAL LOW (ref 101–111)
GFR, EST NON AFRICAN AMERICAN: 58 mL/min — AB (ref >60)
Glucose, Bld: 310 mg/dL — ABNORMAL HIGH (ref 65–99)
Potassium: 3 mmol/L — ABNORMAL LOW (ref 3.5–5.1)
SODIUM: 137 mmol/L (ref 135–145)

## 2016-07-13 LAB — CBC WITH DIFFERENTIAL/PLATELET
BASOS ABS: 0 10*3/uL (ref 0.0–0.1)
BASOS PCT: 0 %
EOS ABS: 0.2 10*3/uL (ref 0.0–0.7)
EOS PCT: 3 %
HCT: 34.5 % — ABNORMAL LOW (ref 36.0–46.0)
Hemoglobin: 12.3 g/dL (ref 12.0–15.0)
Lymphocytes Relative: 35 %
Lymphs Abs: 2.1 10*3/uL (ref 0.7–4.0)
MCH: 29.3 pg (ref 26.0–34.0)
MCHC: 35.7 g/dL (ref 30.0–36.0)
MCV: 82.1 fL (ref 78.0–100.0)
MONO ABS: 0.4 10*3/uL (ref 0.1–1.0)
Monocytes Relative: 7 %
NEUTROS ABS: 3.3 10*3/uL (ref 1.7–7.7)
Neutrophils Relative %: 55 %
PLATELETS: 293 10*3/uL (ref 150–400)
RBC: 4.2 MIL/uL (ref 3.87–5.11)
RDW: 12.6 % (ref 11.5–15.5)
WBC: 5.9 10*3/uL (ref 4.0–10.5)

## 2016-07-13 LAB — URINALYSIS, ROUTINE W REFLEX MICROSCOPIC
Bilirubin Urine: NEGATIVE
Ketones, ur: NEGATIVE mg/dL
LEUKOCYTES UA: NEGATIVE
Nitrite: NEGATIVE
Specific Gravity, Urine: 1.014 (ref 1.005–1.030)
pH: 7.5 (ref 5.0–8.0)

## 2016-07-13 LAB — URINE MICROSCOPIC-ADD ON: WBC UA: NONE SEEN WBC/hpf (ref 0–5)

## 2016-07-13 MED ORDER — TRAMADOL HCL 50 MG PO TABS
50.0000 mg | ORAL_TABLET | Freq: Once | ORAL | Status: AC
  Administered 2016-07-13: 50 mg via ORAL
  Filled 2016-07-13: qty 1

## 2016-07-13 MED ORDER — MORPHINE SULFATE (PF) 4 MG/ML IV SOLN
6.0000 mg | Freq: Once | INTRAVENOUS | Status: AC
Start: 2016-07-13 — End: 2016-07-13
  Administered 2016-07-13: 6 mg via INTRAVENOUS
  Filled 2016-07-13: qty 2

## 2016-07-13 MED ORDER — SODIUM CHLORIDE 0.9 % IV BOLUS (SEPSIS)
1000.0000 mL | Freq: Once | INTRAVENOUS | Status: AC
  Administered 2016-07-13: 1000 mL via INTRAVENOUS

## 2016-07-13 MED ORDER — ONDANSETRON 4 MG PO TBDP
4.0000 mg | ORAL_TABLET | Freq: Once | ORAL | Status: AC
  Administered 2016-07-13: 4 mg via ORAL
  Filled 2016-07-13: qty 1

## 2016-07-13 MED ORDER — TRAMADOL HCL 50 MG PO TABS: 50.0000 mg | ORAL_TABLET | Freq: Four times a day (QID) | ORAL | 0 refills | Status: DC | PRN

## 2016-07-13 MED ORDER — ONDANSETRON HCL 4 MG PO TABS: 4.0000 mg | ORAL_TABLET | Freq: Four times a day (QID) | ORAL | 0 refills | Status: DC

## 2016-07-13 MED ORDER — METHOCARBAMOL 500 MG PO TABS: 500.0000 mg | ORAL_TABLET | Freq: Two times a day (BID) | ORAL | 0 refills | Status: DC

## 2016-07-13 MED ORDER — POTASSIUM CHLORIDE CRYS ER 20 MEQ PO TBCR: 40.0000 meq | EXTENDED_RELEASE_TABLET | Freq: Two times a day (BID) | ORAL | 0 refills | Status: DC

## 2016-07-13 MED ORDER — ONDANSETRON HCL 4 MG/2ML IJ SOLN
4.0000 mg | Freq: Once | INTRAMUSCULAR | Status: AC
  Administered 2016-07-13: 4 mg via INTRAVENOUS
  Filled 2016-07-13: qty 2

## 2016-07-13 MED ORDER — POTASSIUM CHLORIDE CRYS ER 20 MEQ PO TBCR
40.0000 meq | EXTENDED_RELEASE_TABLET | Freq: Once | ORAL | Status: AC
  Administered 2016-07-13: 40 meq via ORAL
  Filled 2016-07-13: qty 2

## 2016-07-13 MED ORDER — METHOCARBAMOL 500 MG PO TABS
500.0000 mg | ORAL_TABLET | Freq: Once | ORAL | Status: AC
  Administered 2016-07-13: 500 mg via ORAL
  Filled 2016-07-13: qty 1

## 2016-07-13 NOTE — ED Triage Notes (Signed)
She states she was seen here for left flank pain earlier today--here now d/t persistent left flank pain. She denies fever. She is concerned because "I have to work tomorrow".

## 2016-07-13 NOTE — ED Provider Notes (Signed)
[facility] DEPT Provider Note   CSN: [id number] Arrival date & time: 07/13/16  5053     History   Chief Complaint Chief Complaint  Patient presents with  . Flank Pain    HPI Latasha [name] is a 60 y.o. female.   Flank Pain  This is a new problem. The current episode started yesterday. The problem occurs constantly. The problem has been gradually worsening. Pertinent negatives include no chest pain, no abdominal pain and no headaches. Nothing aggravates the symptoms. Nothing relieves the symptoms. She has tried nothing for the symptoms. The treatment provided no relief.    Past Medical History:  Diagnosis Date  . BPPV (benign paroxysmal positional vertigo)   . Diabetes mellitus   . Hyperlipidemia   . Hypertension   . Palpitations     Patient Active Problem List   Diagnosis Date Noted  . Hyperlipemia 01/19/2014  . Palpitations 01/19/2014  . PVC's (premature ventricular contractions) 01/19/2014  . SCIATICA, RIGHT 12/12/2010  . GASTROESOPHAGEAL REFLUX DISEASE 02/09/2010  . LEG CRAMPS 02/09/2010  . OBESITY 12/01/2009  . VITAMIN D DEFICIENCY 10/13/2009  . CONSTIPATION 10/13/2009  . LIVER FUNCTION TESTS, ABNORMAL, HX OF 10/13/2009  . Essential hypertension 08/24/2009  . SHOULDER PAIN, RIGHT, CHRONIC 08/24/2009  . DIABETES MELLITUS, TYPE II 10/08/1991    Past Surgical History:  Procedure Laterality Date  . ABDOMINAL HYSTERECTOMY    . EYE SURGERY     cataract/left eye, right eye had bleeding in back  . KNEE SURGERY    . TRIGGER FINGER RELEASE      OB History    No data available       Home Medications    Prior to Admission medications   Medication Sig Start Date End Date Taking? Authorizing Provider  amLODipine (NORVASC) 10 MG tablet Take 10 mg by mouth daily. Reported on 02/08/2016    Historical Provider, MD  aspirin EC 81 MG tablet Take 81 mg by mouth daily as needed (rapid heart beat).    Historical Provider, MD  furosemide (LASIX) 20 MG tablet Take  20 mg by mouth 2 (two) times daily.     Historical Provider, MD  glipiZIDE (GLUCOTROL XL) 10 MG 24 hr tablet Take 10 mg by mouth 2 (two) times daily.     Historical Provider, MD  Insulin Glargine (TOUJEO SOLOSTAR) 300 UNIT/ML SOPN Inject 64 Units into the skin every morning.    Historical Provider, MD  metFORMIN (GLUCOPHAGE) 1000 MG tablet Take 1 tablet by mouth 2 (two) times daily. 04/26/16   Historical Provider, MD  metoprolol succinate (TOPROL-XL) 25 MG 24 hr tablet Take 1 tablet (25 mg total) by mouth daily. 05/26/16   Julianne Rice, MD  olmesartan (BENICAR) 40 MG tablet Take 40 mg by mouth daily.    Historical Provider, MD  potassium chloride SA (K-DUR,KLOR-CON) 20 MEQ tablet Take 2 tablets (40 mEq total) by mouth 2 (two) times daily. 07/13/16 07/20/16  Merrily Pew, MD  quinapril-hydrochlorothiazide (ACCURETIC) 20-25 MG per tablet Take 1 tablet by mouth daily. Reported on 02/08/2016    Historical Provider, MD  rosuvastatin (CRESTOR) 10 MG tablet Take 10 mg by mouth daily.    Historical Provider, MD    Family History Family History  Problem Relation Age of Onset  . Heart disease Father   . Hypertension Father   . Alzheimer's disease Mother   . Dementia Other     Social History Social History  Substance Use Topics  . Smoking status: Never Smoker  .  Smokeless tobacco: Never Used  . Alcohol use Yes     Comment:  rare     Allergies   Review of patient's allergies indicates no known allergies.   Review of Systems Review of Systems  Constitutional: Negative for chills and fever.  Cardiovascular: Negative for chest pain.  Gastrointestinal: Positive for nausea and vomiting. Negative for abdominal pain.  Genitourinary: Positive for flank pain.  Neurological: Negative for headaches.  All other systems reviewed and are negative.    Physical Exam Updated Vital Signs BP 146/63 (BP Location: Left Wrist)   Pulse 70   Temp 98 F (36.7 C) (Oral)   Resp 18   Ht 5' 4.5" (1.638 m)    Wt 245 lb (111.1 kg)   SpO2 92%   BMI 41.40 kg/m   Physical Exam  Constitutional: She appears well-developed and well-nourished. No distress.  HENT:  Head: Normocephalic and atraumatic.  Eyes: Conjunctivae are normal.  Neck: Neck supple.  Cardiovascular: Normal rate and regular rhythm.   No murmur heard. Pulmonary/Chest: Effort normal and breath sounds normal. No respiratory distress.  Abdominal: Soft. There is no tenderness.  Musculoskeletal: She exhibits no edema or deformity.  Neurological: She is alert.  Skin: Skin is warm and dry.  Psychiatric: She has a normal mood and affect.  Nursing note and vitals reviewed.    ED Treatments / Results  Labs (all labs ordered are listed, but only abnormal results are displayed) Labs Reviewed  URINALYSIS, ROUTINE W REFLEX MICROSCOPIC (NOT AT [facility]) - Abnormal; Notable for the following:       Result Value   Glucose, UA >1000 (*)    Hgb urine dipstick MODERATE (*)    Protein, ur >300 (*)    All other components within normal limits  URINE MICROSCOPIC-ADD ON - Abnormal; Notable for the following:    Squamous Epithelial / LPF 0-5 (*)    Bacteria, UA RARE (*)    All other components within normal limits  CBC WITH DIFFERENTIAL/PLATELET - Abnormal; Notable for the following:    HCT 34.5 (*)    All other components within normal limits  BASIC METABOLIC PANEL - Abnormal; Notable for the following:    Potassium 3.0 (*)    Chloride 100 (*)    Glucose, Bld 310 (*)    Creatinine, Ser 1.03 (*)    Calcium 8.4 (*)    GFR calc non Af Amer 58 (*)    All other components within normal limits    EKG  EKG Interpretation None       Radiology Ct Renal Stone Study  Result Date: 07/13/2016 CLINICAL DATA:  Left-sided flank pain radiating into left inguinal region for 4 days EXAM: CT ABDOMEN AND PELVIS WITHOUT CONTRAST TECHNIQUE: Multidetector CT imaging of the abdomen and pelvis was performed following the standard protocol without oral or  intravenous contrast material administration. COMPARISON:  CT abdomen March 21, 2014 FINDINGS: Lower chest: There is a stable 4 mm nodular opacity in the lateral segment of the left lower lobe, seen on axial slice 24 series 4. There is a stable 5 mm nodular opacity in the anterior left base seen on axial slice 19 series 4. Lung bases otherwise are clear. Hepatobiliary: Liver measures 22.4 cm in length. No focal liver lesions are appreciable on this noncontrast enhanced study. Gallbladder wall is not appreciably thickened. There is no biliary duct dilatation. Pancreas: Pancreas shows moderate fatty replacement. No focal pancreatic mass or inflammatory focus. Spleen:  No splenic lesions are  evident. Adrenal/Urinary Tract: Adrenals appear normal bilaterally. Kidneys bilaterally show no evidence of mass or hydronephrosis on either side. There is no renal or ureteral calculus on either side. Urinary bladder is midline with wall thickness within normal limits. Stomach/Bowel: There is fairly diffuse stool throughout the colon. There is no bowel wall or mesenteric thickening. No bowel obstruction evident. No free air or portal venous air. Vascular/Lymphatic: There is no abdominal aortic aneurysm. There are foci of calcification in both proximal renal arteries causing approximately 50% diameter stenosis on each side. Calcification is also noted in the superior mesenteric artery slightly beyond its origin. There is no evident adenopathy in the abdomen or pelvis. Reproductive: Uterus is absent. There is no pelvic mass or pelvic fluid collection. Other: Appendix appears normal. No ascites or abscess evident in the abdomen or pelvis. There is a focal fairly small ventral hernia containing only fat. Musculoskeletal: There is minimal anterolisthesis of L5 on S1, felt to be due to underlying spondylosis. There is vacuum phenomenon in the facets at L5-S1 bilaterally. There are no blastic or lytic bone lesions. There is no  intramuscular or abdominal wall lesion evident. IMPRESSION: No renal or ureteral calculus.  No hydronephrosis. No bowel obstruction. No abscess. Appendix appears normal. Fairly diffuse stool throughout colon. Focal ventral hernia containing only fat. Uterus absent. Atherosclerosis with calcification in both proximal renal arteries as well as in the proximal superior mesenteric artery. No abdominal aortic aneurysm. Prominent liver without focal lesion evident. Stable small nodular opacities in the left lung base region. Stability since 2015 is consistent with benign etiology for these small nodular lesions. Electronically Signed   By: Lowella Grip III M.D.   On: 07/13/2016 08:49    Procedures Procedures (including critical care time)  Medications Ordered in ED Medications  sodium chloride 0.9 % bolus 1,000 mL (0 mLs Intravenous Stopped 07/13/16 1020)  morphine 4 MG/ML injection 6 mg (6 mg Intravenous Given 07/13/16 0818)  ondansetron (ZOFRAN) injection 4 mg (4 mg Intravenous Given 07/13/16 0818)  potassium chloride SA (K-DUR,KLOR-CON) CR tablet 40 mEq (40 mEq Oral Given 07/13/16 1032)     Initial Impression / Assessment and Plan / ED Course  I have reviewed the triage vital signs and the nursing notes.  Pertinent labs & imaging results that were available during my care of the patient were reviewed by me and considered in my medical decision making (see chart for details).  Clinical Course    Possibly a passed stone. No e/o pyelo. No e/o fracture. No rash to suggest zoster. Pain relieved with one dose of pain medications and no repeat vomiting. Could also be an atypical viral gastroenteritis. Either way will follow up w/ PCP.   Final Clinical Impressions(s) / ED Diagnoses   Final diagnoses:  Flank pain    New Prescriptions Discharge Medication List as of 07/13/2016 10:25 AM    START taking these medications   Details  potassium chloride SA (K-DUR,KLOR-CON) 20 MEQ tablet Take 2  tablets (40 mEq total) by mouth 2 (two) times daily., Starting Sat 07/13/2016, Until Sat 07/20/2016, Print         Merrily Pew, MD 07/13/16 1521

## 2016-07-13 NOTE — ED Provider Notes (Signed)
[facility] DEPT Provider Note   CSN: [id number] Arrival date & time: 07/13/16  1812     History   Chief Complaint Chief Complaint  Patient presents with  . Flank Pain    HPI Latasha [name] is a 60 y.o. female.  Patient presents today with a chief complaint of left flank pain.  She states that she has been having the pain for the past 2 days.  She reports that she did have one episode where the pain radiated down her left leg.  She denies any strenuous activity.  Pain is worse with movement.  She was seen in the ED earlier today for the same and had a CT ab/pelvis done, which was negative.  She also had a UA, which was also negative.  Labs unremarkable early today aside from hyperglycemia.  She states that she returned to the ED at this time because her nausea and pain returned this evening.  She states that she was not given any prescriptions for nausea medication and pain medication. She states that she was vomiting earlier today, but has not vomited since leaving the ED.  She denies SOB, chest pain, cough, or hemoptysis.  No fever or chills.  She denies any history of PE or DVT.  Denies prolonged travel or surgeries in the past months.  Denies exogenous estrogen use.      Past Medical History:  Diagnosis Date  . BPPV (benign paroxysmal positional vertigo)   . Diabetes mellitus   . Hyperlipidemia   . Hypertension   . Palpitations     Patient Active Problem List   Diagnosis Date Noted  . Hyperlipemia 01/19/2014  . Palpitations 01/19/2014  . PVC's (premature ventricular contractions) 01/19/2014  . SCIATICA, RIGHT 12/12/2010  . GASTROESOPHAGEAL REFLUX DISEASE 02/09/2010  . LEG CRAMPS 02/09/2010  . OBESITY 12/01/2009  . VITAMIN D DEFICIENCY 10/13/2009  . CONSTIPATION 10/13/2009  . LIVER FUNCTION TESTS, ABNORMAL, HX OF 10/13/2009  . Essential hypertension 08/24/2009  . SHOULDER PAIN, RIGHT, CHRONIC 08/24/2009  . DIABETES MELLITUS, TYPE II 10/08/1991    Past Surgical  History:  Procedure Laterality Date  . ABDOMINAL HYSTERECTOMY    . EYE SURGERY     cataract/left eye, right eye had bleeding in back  . KNEE SURGERY    . TRIGGER FINGER RELEASE      OB History    No data available       Home Medications    Prior to Admission medications   Medication Sig Start Date End Date Taking? Authorizing Provider  amLODipine (NORVASC) 10 MG tablet Take 10 mg by mouth daily. Reported on 02/08/2016    Historical Provider, MD  aspirin EC 81 MG tablet Take 81 mg by mouth daily as needed (rapid heart beat).    Historical Provider, MD  furosemide (LASIX) 20 MG tablet Take 20 mg by mouth 2 (two) times daily.     Historical Provider, MD  glipiZIDE (GLUCOTROL XL) 10 MG 24 hr tablet Take 10 mg by mouth 2 (two) times daily.     Historical Provider, MD  Insulin Glargine (TOUJEO SOLOSTAR) 300 UNIT/ML SOPN Inject 64 Units into the skin every morning.    Historical Provider, MD  metFORMIN (GLUCOPHAGE) 1000 MG tablet Take 1 tablet by mouth 2 (two) times daily. 04/26/16   Historical Provider, MD  metoprolol succinate (TOPROL-XL) 25 MG 24 hr tablet Take 1 tablet (25 mg total) by mouth daily. 05/26/16   Julianne Rice, MD  olmesartan (BENICAR) 40 MG tablet Take  40 mg by mouth daily.    Historical Provider, MD  potassium chloride SA (K-DUR,KLOR-CON) 20 MEQ tablet Take 2 tablets (40 mEq total) by mouth 2 (two) times daily. 07/13/16 07/20/16  Merrily Pew, MD  quinapril-hydrochlorothiazide (ACCURETIC) 20-25 MG per tablet Take 1 tablet by mouth daily. Reported on 02/08/2016    Historical Provider, MD  rosuvastatin (CRESTOR) 10 MG tablet Take 10 mg by mouth daily.    Historical Provider, MD    Family History Family History  Problem Relation Age of Onset  . Heart disease Father   . Hypertension Father   . Alzheimer's disease Mother   . Dementia Other     Social History Social History  Substance Use Topics  . Smoking status: Never Smoker  . Smokeless tobacco: Never Used  . Alcohol  use Yes     Comment:  rare     Allergies   Review of patient's allergies indicates no known allergies.   Review of Systems Review of Systems  All other systems reviewed and are negative.    Physical Exam Updated Vital Signs BP 194/91 (BP Location: Left Arm) Comment: Pt states has hx of hypertension and has not taken her evening meds  Pulse 80   Temp 98.2 F (36.8 C) (Oral)   Resp 18   SpO2 92%   Physical Exam  Constitutional: She appears well-developed and well-nourished.  HENT:  Head: Normocephalic and atraumatic.  Mouth/Throat: Oropharynx is clear and moist.  Neck: Normal range of motion. Neck supple.  Cardiovascular: Normal rate, regular rhythm and normal heart sounds.   Pulmonary/Chest: Effort normal and breath sounds normal.  Abdominal: Soft. Bowel sounds are normal. She exhibits no distension and no mass. There is no tenderness. There is no rebound and no guarding. No hernia.  Left CVA tenderness  Musculoskeletal: Normal range of motion.  Neurological: She is alert.  Skin: Skin is warm and dry.  Psychiatric: She has a normal mood and affect.  Nursing note and vitals reviewed.    ED Treatments / Results  Labs (all labs ordered are listed, but only abnormal results are displayed) Labs Reviewed - No data to display  EKG  EKG Interpretation None       Radiology Ct Renal Stone Study  Result Date: 07/13/2016 CLINICAL DATA:  Left-sided flank pain radiating into left inguinal region for 4 days EXAM: CT ABDOMEN AND PELVIS WITHOUT CONTRAST TECHNIQUE: Multidetector CT imaging of the abdomen and pelvis was performed following the standard protocol without oral or intravenous contrast material administration. COMPARISON:  CT abdomen March 21, 2014 FINDINGS: Lower chest: There is a stable 4 mm nodular opacity in the lateral segment of the left lower lobe, seen on axial slice 24 series 4. There is a stable 5 mm nodular opacity in the anterior left base seen on axial  slice 19 series 4. Lung bases otherwise are clear. Hepatobiliary: Liver measures 22.4 cm in length. No focal liver lesions are appreciable on this noncontrast enhanced study. Gallbladder wall is not appreciably thickened. There is no biliary duct dilatation. Pancreas: Pancreas shows moderate fatty replacement. No focal pancreatic mass or inflammatory focus. Spleen:  No splenic lesions are evident. Adrenal/Urinary Tract: Adrenals appear normal bilaterally. Kidneys bilaterally show no evidence of mass or hydronephrosis on either side. There is no renal or ureteral calculus on either side. Urinary bladder is midline with wall thickness within normal limits. Stomach/Bowel: There is fairly diffuse stool throughout the colon. There is no bowel wall or mesenteric thickening. No bowel  obstruction evident. No free air or portal venous air. Vascular/Lymphatic: There is no abdominal aortic aneurysm. There are foci of calcification in both proximal renal arteries causing approximately 50% diameter stenosis on each side. Calcification is also noted in the superior mesenteric artery slightly beyond its origin. There is no evident adenopathy in the abdomen or pelvis. Reproductive: Uterus is absent. There is no pelvic mass or pelvic fluid collection. Other: Appendix appears normal. No ascites or abscess evident in the abdomen or pelvis. There is a focal fairly small ventral hernia containing only fat. Musculoskeletal: There is minimal anterolisthesis of L5 on S1, felt to be due to underlying spondylosis. There is vacuum phenomenon in the facets at L5-S1 bilaterally. There are no blastic or lytic bone lesions. There is no intramuscular or abdominal wall lesion evident. IMPRESSION: No renal or ureteral calculus.  No hydronephrosis. No bowel obstruction. No abscess. Appendix appears normal. Fairly diffuse stool throughout colon. Focal ventral hernia containing only fat. Uterus absent. Atherosclerosis with calcification in both  proximal renal arteries as well as in the proximal superior mesenteric artery. No abdominal aortic aneurysm. Prominent liver without focal lesion evident. Stable small nodular opacities in the left lung base region. Stability since 2015 is consistent with benign etiology for these small nodular lesions. Electronically Signed   By: Lowella Grip III M.D.   On: 07/13/2016 08:49    Procedures Procedures (including critical care time)  Medications Ordered in ED Medications  ondansetron (ZOFRAN-ODT) disintegrating tablet 4 mg (4 mg Oral Given 07/13/16 1934)  methocarbamol (ROBAXIN) tablet 500 mg (500 mg Oral Given 07/13/16 1934)  traMADol (ULTRAM) tablet 50 mg (50 mg Oral Given 07/13/16 1934)     Initial Impression / Assessment and Plan / ED Course  I have reviewed the triage vital signs and the nursing notes.  Pertinent labs & imaging results that were available during my care of the patient were reviewed by me and considered in my medical decision making (see chart for details).  Clinical Course   Patient presents today with left flank pain that has been present for the past 2 days.  She was seen in the ED earlier today for the same and had a CT ab/pelvis, UA, and labs performed.  Work up was unremarkable.  She denies any fever, cough, SOB, or chest pain.  Pain and nausea improved in the ED.  Patient tolerating PO liquids.  She feels that she is stable for discharge.  Return precautions given.  Final Clinical Impressions(s) / ED Diagnoses   Final diagnoses:  None    New Prescriptions New Prescriptions   No medications on file     Hyman Bible, PA-C 07/14/16 [place], MD 07/14/16 [phone]

## 2016-07-13 NOTE — ED Notes (Signed)
No respiratory or acute distress noted alert and oriented x 3 call light in reach no reaction to medication noted visitor at bedside. 

## 2016-07-13 NOTE — ED Notes (Signed)
Bed: PV94 Expected date:  Expected time:  Means of arrival:  Comments: EMS-syncope

## 2016-07-13 NOTE — ED Triage Notes (Signed)
Pt c/o left sided flank pain that radiates around to left groin.  Pt has had pain since Wednesday.  Tried OTC pain relievers w/o relief.  +urinary urgency and frequency.  Rates pain 9/10.

## 2016-07-13 NOTE — ED Notes (Signed)
Bed: [facility]1 Expected date:  Expected time:  Means of arrival:  Comments: 

## 2016-07-13 NOTE — ED Notes (Signed)
Patient c/o left flank/LLQ pain x3 days.  She was seen here for same earlier today and did not receive a diagnosis.  CT unremarkable.  Patient discharged home with Rx for potassium.  Patient requests something for pain and nausea.  She endorses nausea, but has not vomited since this morning.  Patient's abdomen is soft and non-tender to palpation, no CVA tenderness.

## 2016-07-17 DIAGNOSIS — I10 Essential (primary) hypertension: Secondary | ICD-10-CM | POA: Diagnosis not present

## 2016-07-17 DIAGNOSIS — R109 Unspecified abdominal pain: Secondary | ICD-10-CM | POA: Diagnosis not present

## 2016-07-17 DIAGNOSIS — R42 Dizziness and giddiness: Secondary | ICD-10-CM | POA: Diagnosis not present

## 2016-07-25 DIAGNOSIS — H8113 Benign paroxysmal vertigo, bilateral: Secondary | ICD-10-CM | POA: Diagnosis not present

## 2016-07-25 DIAGNOSIS — E1165 Type 2 diabetes mellitus with hyperglycemia: Secondary | ICD-10-CM | POA: Diagnosis not present

## 2016-07-25 DIAGNOSIS — E1140 Type 2 diabetes mellitus with diabetic neuropathy, unspecified: Secondary | ICD-10-CM | POA: Diagnosis not present

## 2016-07-25 DIAGNOSIS — F419 Anxiety disorder, unspecified: Secondary | ICD-10-CM | POA: Diagnosis not present

## 2016-07-27 ENCOUNTER — Other Ambulatory Visit: Payer: Self-pay | Admitting: Family Medicine

## 2016-07-27 DIAGNOSIS — H8113 Benign paroxysmal vertigo, bilateral: Secondary | ICD-10-CM

## 2016-08-01 ENCOUNTER — Ambulatory Visit: Payer: BLUE CROSS/BLUE SHIELD | Admitting: Neurology

## 2016-08-08 ENCOUNTER — Encounter: Payer: Self-pay | Admitting: Internal Medicine

## 2016-08-09 ENCOUNTER — Encounter: Payer: Self-pay | Admitting: Neurology

## 2016-08-09 ENCOUNTER — Ambulatory Visit
Admission: RE | Admit: 2016-08-09 | Discharge: 2016-08-09 | Disposition: A | Discharge status: Home / Self Care | Payer: BLUE CROSS/BLUE SHIELD | Source: Ambulatory Visit | Attending: Family Medicine | Admitting: Family Medicine

## 2016-08-09 ENCOUNTER — Ambulatory Visit (INDEPENDENT_AMBULATORY_CARE_PROVIDER_SITE_OTHER): Payer: BLUE CROSS/BLUE SHIELD | Admitting: Neurology

## 2016-08-09 VITALS — BP 136/90 | HR 87 | Ht 65.0 in | Wt 234.0 lb

## 2016-08-09 DIAGNOSIS — H8112 Benign paroxysmal vertigo, left ear: Secondary | ICD-10-CM

## 2016-08-09 DIAGNOSIS — H9193 Unspecified hearing loss, bilateral: Secondary | ICD-10-CM | POA: Diagnosis not present

## 2016-08-09 DIAGNOSIS — IMO0002 Reserved for concepts with insufficient information to code with codable children: Secondary | ICD-10-CM

## 2016-08-09 DIAGNOSIS — H8113 Benign paroxysmal vertigo, bilateral: Secondary | ICD-10-CM

## 2016-08-09 DIAGNOSIS — R2689 Other abnormalities of gait and mobility: Secondary | ICD-10-CM | POA: Diagnosis not present

## 2016-08-09 DIAGNOSIS — R42 Dizziness and giddiness: Secondary | ICD-10-CM | POA: Diagnosis not present

## 2016-08-09 NOTE — Patient Instructions (Signed)
We will see what the MRI of the brain shows.  If it does not explain the hearing loss or dizziness, I recommend going back to vestibular rehabilitation.  Also, consider seeing Ear, Nose and Throat.

## 2016-08-09 NOTE — Progress Notes (Signed)
Chart forwarded.  

## 2016-08-09 NOTE — Progress Notes (Addendum)
NEUROLOGY FOLLOW UP OFFICE NOTE  Latasha [name] [id number]  HISTORY OF PRESENT ILLNESS: Latasha [name] is a 60 year old left-handed female with hypertension, hyperlipidemia, diabetes and PVCs who follows up for vertigo, as well as a new issue.  UPDATE: She went to vestibular rehabilitation once.  However, it worsened symptoms, so she didn't return.  When driving, the vertigo is triggered when looking over her left shoulder.    About 2 weeks ago, she heard a sudden whooshing sound in both ears, followed by hearing loss and aural fullness in both ears.  Over 2 minutes, ears opened up gradually with return of hearing.  She experienced vertigo afterwards which resolved quickly after she was able to sit and rest.  There was no associated double vision, slurred speech, facial droop.  She has an MRI of the brain scheduled for later today.   HISTORY: She started experiencing dizziness and vertigo for about 3 years.  Initially, she experienced spinning sensation whenever she would roll over in bed.  Sometimes, she would wake up with the spinning, associated with nausea and vomiting.  The spinning usually lasts no more than a minute.  She also reports ringing in her left ear but denies hearing loss.     She also feels a different dizziness, more like a floating or lightheaded sensation.  It such cases, she feels off-balance.  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     She tried self-Epley maneuver which makes it worse.  PAST MEDICAL HISTORY: Past Medical History:  Diagnosis Date  . BPPV (benign paroxysmal positional vertigo)   . Diabetes mellitus   . Hyperlipidemia   . Hypertension   . Palpitations     MEDICATIONS: Current Outpatient Prescriptions on File Prior to Visit  Medication Sig Dispense Refill  . amLODipine (NORVASC) 10 MG tablet Take 10 mg by mouth daily. Reported on 02/08/2016    . aspirin EC 81 MG tablet Take 81 mg by mouth daily as  needed (rapid heart beat).    . furosemide (LASIX) 20 MG tablet Take 20 mg by mouth 2 (two) times daily.     Marland Kitchen glipiZIDE (GLUCOTROL XL) 10 MG 24 hr tablet Take 10 mg by mouth 2 (two) times daily.     . Insulin Glargine (TOUJEO SOLOSTAR) 300 UNIT/ML SOPN Inject 64 Units into the skin every morning.    . metFORMIN (GLUCOPHAGE) 1000 MG tablet Take 1 tablet by mouth 2 (two) times daily.    . methocarbamol (ROBAXIN) 500 MG tablet Take 1 tablet (500 mg total) by mouth 2 (two) times daily. 20 tablet 0  . metoprolol succinate (TOPROL-XL) 25 MG 24 hr tablet Take 1 tablet (25 mg total) by mouth daily. 30 tablet 0  . olmesartan (BENICAR) 40 MG tablet Take 40 mg by mouth daily.    . ondansetron (ZOFRAN) 4 MG tablet Take 1 tablet (4 mg total) by mouth every 6 (six) hours. 12 tablet 0  . quinapril-hydrochlorothiazide (ACCURETIC) 20-25 MG per tablet Take 1 tablet by mouth daily. Reported on 02/08/2016    . rosuvastatin (CRESTOR) 10 MG tablet Take 10 mg by mouth daily.    . traMADol (ULTRAM) 50 MG tablet Take 1 tablet (50 mg total) by mouth every 6 (six) hours as needed. 15 tablet 0  . potassium chloride SA (K-DUR,KLOR-CON) 20 MEQ tablet Take 2 tablets (40 mEq total) by mouth 2 (two) times daily. 28 tablet 0   No  current facility-administered medications on file prior to visit.     ALLERGIES: No Known Allergies  FAMILY HISTORY: Family History  Problem Relation Age of Onset  . Heart disease Father   . Hypertension Father   . Alzheimer's disease Mother   . Dementia Other    .  SOCIAL HISTORY: Social History   Social History  . Marital status: Married    Spouse name: N/A  . Number of children: N/A  . Years of education: N/A   Occupational History  . Not on file.   Social History Main Topics  . Smoking status: Never Smoker  . Smokeless tobacco: Never Used  . Alcohol use Yes     Comment:  rare  . Drug use: No  . Sexual activity: Not on file   Other Topics Concern  . Not on file   Social  History Narrative  . No narrative on file    REVIEW OF SYSTEMS: Constitutional: No fevers, chills, or sweats, no generalized fatigue, change in appetite Eyes: No visual changes, double vision, eye pain Ear, nose and throat: No hearing loss, ear pain, nasal congestion, sore throat Cardiovascular: No chest pain, palpitations Respiratory:  No shortness of breath at rest or with exertion, wheezes GastrointestinaI: No nausea, vomiting, diarrhea, abdominal pain, fecal incontinence Genitourinary:  No dysuria, urinary retention or frequency Musculoskeletal:  No neck pain, back pain Integumentary: No rash, pruritus, skin lesions Neurological: as above Psychiatric: No depression, insomnia, anxiety Endocrine: No palpitations, fatigue, diaphoresis, mood swings, change in appetite, change in weight, increased thirst Hematologic/Lymphatic:  No purpura, petechiae. Allergic/Immunologic: no itchy/runny eyes, nasal congestion, recent allergic reactions, rashes  PHYSICAL EXAM: Vitals:   08/09/16 1053  BP: 136/90  Pulse: 87   General: No acute distress.  Patient appears well-groomed.   Head:  Normocephalic/atraumatic Eyes:  Fundi examined but not visualized Neck: supple, no paraspinal tenderness, full range of motion Heart:  Regular rate and rhythm Lungs:  Clear to auscultation bilaterally Back: No paraspinal tenderness Neurological Exam: alert and oriented to person, place, and time. Attention span and concentration intact, recent and remote memory intact, fund of knowledge intact.  Speech fluent and not dysarthric, language intact.  Saccades noted bilaterally with smooth pursuit.  CN II-XII intact. Bulk and tone normal, muscle strength 5/5 throughout.  Sensation to light touch  intact.  Deep tendon reflexes 2+ throughout.  Finger to nose testing intact.  Gait cautious.  Unable to tandem walk.  Romberg with mild sway.   Dix-Hallpike:  Horizontal nystagmus to the left.  Felt woozy but did not reproduce  vertigo. Head Impulse Test negative but did produce horizontal nystagmus on primary gaze.  IMPRESSION: Positional vertigo. She does exhibit some mild saccades on tracking.  She also exhibits bilateral horizontal nystagmus when I performed the head impulse test and Dix-Hallpike to the left. Sudden onset hearing loss and aural fullness.    She has an MRI of the brain scheduled for later today.  We will see what that shows.  If it does not reveal anything that explains the vertigo or sudden transient hearing loss, then I don't have a neurologic explanation for symptoms.  I would then deduce that vertigo still BPPV and the event of hearing loss with aural fullness may be episode of eustachian tube dysfunction.  I would recommend returning to vestibular rehab.  I explained that it may take more than one treatment and that symptoms at first may get worse.  PCP may also consider referral to ENT as  well.  ADDENDUM:  MRI is unremarkable  15 minutes spent face to face with patient, over 50% spent counseling.  Metta Clines, DO  CC:  Shirline Frees, MD

## 2016-08-13 ENCOUNTER — Telehealth: Payer: Self-pay

## 2016-08-13 NOTE — Telephone Encounter (Signed)
-----   Message from Pieter Partridge, DO sent at 08/13/2016 10:16 AM EST ----- I reviewed MRI of brain.  It does not reveal anything to explain dizziness.  I recommend trying to give vestibular rehabilitation a chance.  Otherwise, I have no other recommendations except that she may want to discuss with her PCP about seeing ENT.

## 2016-08-13 NOTE — Telephone Encounter (Signed)
Message relayed to patient. Verbalized understanding and denied questions.   

## 2016-08-23 DIAGNOSIS — H8113 Benign paroxysmal vertigo, bilateral: Secondary | ICD-10-CM | POA: Diagnosis not present

## 2016-08-23 DIAGNOSIS — F419 Anxiety disorder, unspecified: Secondary | ICD-10-CM | POA: Diagnosis not present

## 2016-08-23 DIAGNOSIS — R109 Unspecified abdominal pain: Secondary | ICD-10-CM | POA: Diagnosis not present

## 2016-08-23 DIAGNOSIS — E1165 Type 2 diabetes mellitus with hyperglycemia: Secondary | ICD-10-CM | POA: Diagnosis not present

## 2016-09-06 DIAGNOSIS — R42 Dizziness and giddiness: Secondary | ICD-10-CM | POA: Diagnosis not present

## 2016-09-06 DIAGNOSIS — H9313 Tinnitus, bilateral: Secondary | ICD-10-CM | POA: Diagnosis not present

## 2016-09-10 ENCOUNTER — Ambulatory Visit: Payer: BLUE CROSS/BLUE SHIELD | Admitting: Endocrinology

## 2016-09-13 DIAGNOSIS — R42 Dizziness and giddiness: Secondary | ICD-10-CM | POA: Diagnosis not present

## 2016-09-20 DIAGNOSIS — R42 Dizziness and giddiness: Secondary | ICD-10-CM | POA: Diagnosis not present

## 2016-10-04 DIAGNOSIS — E113513 Type 2 diabetes mellitus with proliferative diabetic retinopathy with macular edema, bilateral: Secondary | ICD-10-CM | POA: Diagnosis not present

## 2016-10-11 DIAGNOSIS — E876 Hypokalemia: Secondary | ICD-10-CM | POA: Diagnosis not present

## 2016-10-11 DIAGNOSIS — E1165 Type 2 diabetes mellitus with hyperglycemia: Secondary | ICD-10-CM | POA: Diagnosis not present

## 2016-10-11 DIAGNOSIS — R42 Dizziness and giddiness: Secondary | ICD-10-CM | POA: Diagnosis not present

## 2016-10-11 DIAGNOSIS — E7800 Pure hypercholesterolemia, unspecified: Secondary | ICD-10-CM | POA: Diagnosis not present

## 2016-10-11 DIAGNOSIS — I10 Essential (primary) hypertension: Secondary | ICD-10-CM | POA: Diagnosis not present

## 2016-10-27 NOTE — Progress Notes (Signed)
Subjective:    Patient ID: Latasha [name], female    DOB: 1956-06-26, 61 y.o.   MRN: [id number]  HPI pt is referred by Dr Kenton Kingfisher, for diabetes.  Pt states DM was dx'ed in 1995; she has moderate neuropathy of the lower extremities; she has associated prolif retinopathy and renal insuff; she has been on insulin since soon after dx; pt says her diet and exercise are not good; she has never had GDM, pancreatitis, severe hypoglycemia or DKA.  She takes toujeo and 2 oral meds.  Despite increasing the toujeo to 64 units/day, cbg's are always over 200.   Past Medical History:  Diagnosis Date  . BPPV (benign paroxysmal positional vertigo)   . Diabetes mellitus   . Hyperlipidemia   . Hypertension   . Palpitations     Past Surgical History:  Procedure Laterality Date  . ABDOMINAL HYSTERECTOMY    . EYE SURGERY     cataract/left eye, right eye had bleeding in back  . KNEE SURGERY    . TRIGGER FINGER RELEASE      Social History   Social History  . Marital status: Married    Spouse name: N/A  . Number of children: N/A  . Years of education: N/A   Occupational History  . Not on file.   Social History Main Topics  . Smoking status: Never Smoker  . Smokeless tobacco: Never Used  . Alcohol use Yes     Comment:  rare  . Drug use: No  . Sexual activity: Not on file   Other Topics Concern  . Not on file   Social History Narrative  . No narrative on file    Current Outpatient Prescriptions on File Prior to Visit  Medication Sig Dispense Refill  . amLODipine (NORVASC) 10 MG tablet Take 10 mg by mouth daily. Reported on 02/08/2016    . aspirin EC 81 MG tablet Take 81 mg by mouth daily as needed (rapid heart beat).    . furosemide (LASIX) 20 MG tablet Take 20 mg by mouth 2 (two) times daily.     Marland Kitchen glipiZIDE (GLUCOTROL XL) 10 MG 24 hr tablet Take 10 mg by mouth 2 (two) times daily.     . metFORMIN (GLUCOPHAGE) 1000 MG tablet Take 1 tablet by mouth 2 (two) times daily.    . methocarbamol  (ROBAXIN) 500 MG tablet Take 1 tablet (500 mg total) by mouth 2 (two) times daily. 20 tablet 0  . metoprolol succinate (TOPROL-XL) 25 MG 24 hr tablet Take 1 tablet (25 mg total) by mouth daily. 30 tablet 0  . olmesartan (BENICAR) 40 MG tablet Take 40 mg by mouth daily.    . ondansetron (ZOFRAN) 4 MG tablet Take 1 tablet (4 mg total) by mouth every 6 (six) hours. 12 tablet 0  . quinapril-hydrochlorothiazide (ACCURETIC) 20-25 MG per tablet Take 1 tablet by mouth daily. Reported on 02/08/2016    . rosuvastatin (CRESTOR) 10 MG tablet Take 10 mg by mouth daily.    . traMADol (ULTRAM) 50 MG tablet Take 1 tablet (50 mg total) by mouth every 6 (six) hours as needed. 15 tablet 0  . potassium chloride SA (K-DUR,KLOR-CON) 20 MEQ tablet Take 2 tablets (40 mEq total) by mouth 2 (two) times daily. 28 tablet 0   No current facility-administered medications on file prior to visit.     No Known Allergies  Family History  Problem Relation Age of Onset  . Heart disease Father   . Hypertension  Father   . Alzheimer's disease Mother   . Diabetes Mother   . Diabetes Sister   . Dementia Other     BP (!) 144/84   Pulse 71   Ht 5\' 5"  (1.651 m)   Wt 245 lb (111.1 kg)   SpO2 97%   BMI 40.77 kg/m   Review of Systems denies headache, chest pain, sob, n/v, muscle cramps, excessive diaphoresis, depression, rhinorrhea, and easy bruising.  She has chronically blurry vision (sees opthal).  She has gained weight.  She has frequent urination and cold intolerance.     Objective:   Physical Exam VS: see vs page GEN: no distress HEAD: head: no deformity eyes: no periorbital swelling, no proptosis external nose and ears are normal mouth: no lesion seen NECK: supple, thyroid is not enlarged CHEST WALL: no deformity LUNGS: clear to auscultation CV: reg rate and rhythm, no murmur ABD: abdomen is soft, nontender.  no hepatosplenomegaly.  not distended.  no hernia MUSCULOSKELETAL: muscle bulk and strength are  grossly normal.  no obvious joint swelling.  gait is normal and steady EXTEMITIES: no deformity.  no ulcer on the feet, but the skin is dry.  feet are of normal color and temp.  1+ bilat leg edema.  There is bilateral onychomycosis of the toenails PULSES: dorsalis pedis intact bilat.  no carotid bruit NEURO:  cn 2-12 grossly intact.   readily moves all 4's.  sensation is intact to touch on the feet, but decreased from normal SKIN:  Normal texture and temperature.  No rash or suspicious lesion is visible.   NODES:  None palpable at the neck. PSYCH: alert, well-oriented.  Does not appear anxious nor depressed.    I personally reviewed electrocardiogram tracing (05/26/16):  Indication: palpitations.  Impression: NSR.  No MI.  Borderline LVH.   Compared to tracing earlier the same day: PVC have resolved.  outside test results are reviewed: A1c=13.5  I have reviewed outside records, and summarized: Pt was noted to have severely elevated a1c, and referred here.  He was noted to be undergoing insulin dosage titration, but cbg's stayed high.  Vision was also noted to be poor--sees opthal     Assessment & Plan:  Insulin-requiring type 2 DM, with renal insufficiency: severe exacerbation.  she needs increased rx.   Obesity: new to me.    Patient is advised the following: Patient Instructions  good diet and exercise significantly improve the control of your diabetes.  please let me know if you wish to be referred to a dietician.  high blood sugar is very risky to your health.  you should see an eye doctor and dentist every year.  It is very important to get all recommended vaccinations.  Controlling your blood pressure and cholesterol drastically reduces the damage diabetes does to your body.  Those who smoke should quit.  Please discuss these with your doctor.  check your blood sugar twice a day.  vary the time of day when you check, between before the 3 meals, and at bedtime.  also check if you  have symptoms of your blood sugar being too high or too low.  please keep a record of the readings and bring it to your next appointment here (or you can bring the meter itself).  You can write it on any piece of paper.  please call us sooner if your blood sugar goes below 70, or if you have a lot of readings over 200.   For now, please: Increase  the toujeo to 80 units each morning, and:  Please continue the same diabetes pills for now. Please come back for a follow-up appointment in 1 month.  Please call us next week, to tell us how the blood sugar is doing.     Bariatric Surgery You have so much to gain by losing weight.  You may have already tried every diet and exercise plan imaginable.  And, you may have sought advice from your family physician, too.   Sometimes, in spite of such diligent efforts, you may not be able to achieve long-term results by yourself.  In cases of severe obesity, bariatric or weight loss surgery is a proven method of achieving long-term weight control.  Our Services Our bariatric surgery programs offer our patients new hope and long-term weight-loss solution.  Since introducing our services in 2003, we have conducted more than 2,400 successful procedures.  Our program is designated as a Programmer, multimedia by the Metabolic and Bariatric Surgery Accreditation and Quality Improvement Program (MBSAQIP), a IT trainer that sets rigorous patient safety and outcome standards.  Our program is also designated as a Ecologist by [employer].   Our exceptional weight-loss surgery team specializes in diagnosis, treatment, follow-up care, and ongoing support for our patients with severe weight loss challenges.  We currently offer laparoscopic sleeve gastrectomy, gastric bypass, and adjustable gastric band (LAP-BAND).    Attend our [place] Choosing to undergo a bariatric procedure is a big decision, and one that should not be taken  lightly.  You now have two options in how you learn about weight-loss surgery - in person or online.  Our objective is to ensure you have all of the information that you need to evaluate the advantages and obligations of this life changing procedure.  Please note that you are not alone in this process, and our experienced team is ready to assist and answer all of your questions.  There are several ways to register for a seminar (either on-line or in person): 1)  Call [phone] 2) Go on-line to [facility] and register for either type of seminar.  [email or web address]

## 2016-11-01 ENCOUNTER — Encounter: Payer: Self-pay | Admitting: Endocrinology

## 2016-11-01 ENCOUNTER — Ambulatory Visit (INDEPENDENT_AMBULATORY_CARE_PROVIDER_SITE_OTHER): Payer: BLUE CROSS/BLUE SHIELD | Admitting: Endocrinology

## 2016-11-01 VITALS — BP 144/84 | HR 71 | Ht 65.0 in | Wt 245.0 lb

## 2016-11-01 DIAGNOSIS — E1122 Type 2 diabetes mellitus with diabetic chronic kidney disease: Secondary | ICD-10-CM | POA: Diagnosis not present

## 2016-11-01 DIAGNOSIS — N183 Chronic kidney disease, stage 3 unspecified: Secondary | ICD-10-CM

## 2016-11-01 DIAGNOSIS — Z794 Long term (current) use of insulin: Secondary | ICD-10-CM | POA: Diagnosis not present

## 2016-11-01 MED ORDER — INSULIN GLARGINE 300 UNIT/ML ~~LOC~~ SOPN: 80.0000 [IU] | PEN_INJECTOR | SUBCUTANEOUS | 11 refills | Status: DC

## 2016-11-01 NOTE — Patient Instructions (Addendum)
good diet and exercise significantly improve the control of your diabetes.  please let me know if you wish to be referred to a dietician.  high blood sugar is very risky to your health.  you should see an eye doctor and dentist every year.  It is very important to get all recommended vaccinations.  Controlling your blood pressure and cholesterol drastically reduces the damage diabetes does to your body.  Those who smoke should quit.  Please discuss these with your doctor.  check your blood sugar twice a day.  vary the time of day when you check, between before the 3 meals, and at bedtime.  also check if you have symptoms of your blood sugar being too high or too low.  please keep a record of the readings and bring it to your next appointment here (or you can bring the meter itself).  You can write it on any piece of paper.  please call us sooner if your blood sugar goes below 70, or if you have a lot of readings over 200.   For now, please: Increase the toujeo to 80 units each morning, and:  Please continue the same diabetes pills for now. Please come back for a follow-up appointment in 1 month.  Please call us next week, to tell us how the blood sugar is doing.     Bariatric Surgery You have so much to gain by losing weight.  You may have already tried every diet and exercise plan imaginable.  And, you may have sought advice from your family physician, too.   Sometimes, in spite of such diligent efforts, you may not be able to achieve long-term results by yourself.  In cases of severe obesity, bariatric or weight loss surgery is a proven method of achieving long-term weight control.  Our Services Our bariatric surgery programs offer our patients new hope and long-term weight-loss solution.  Since introducing our services in 2003, we have conducted more than 2,400 successful procedures.  Our program is designated as a Programmer, multimedia by the Metabolic and Bariatric Surgery Accreditation and  Quality Improvement Program (MBSAQIP), a IT trainer that sets rigorous patient safety and outcome standards.  Our program is also designated as a Ecologist by [employer].   Our exceptional weight-loss surgery team specializes in diagnosis, treatment, follow-up care, and ongoing support for our patients with severe weight loss challenges.  We currently offer laparoscopic sleeve gastrectomy, gastric bypass, and adjustable gastric band (LAP-BAND).    Attend our [place] Choosing to undergo a bariatric procedure is a big decision, and one that should not be taken lightly.  You now have two options in how you learn about weight-loss surgery - in person or online.  Our objective is to ensure you have all of the information that you need to evaluate the advantages and obligations of this life changing procedure.  Please note that you are not alone in this process, and our experienced team is ready to assist and answer all of your questions.  There are several ways to register for a seminar (either on-line or in person): 1)  Call [phone] 2) Go on-line to [facility] and register for either type of seminar.  [email or web address]

## 2016-11-08 DIAGNOSIS — E113511 Type 2 diabetes mellitus with proliferative diabetic retinopathy with macular edema, right eye: Secondary | ICD-10-CM | POA: Diagnosis not present

## 2016-11-15 ENCOUNTER — Ambulatory Visit: Payer: BLUE CROSS/BLUE SHIELD | Admitting: Neurology

## 2016-11-15 DIAGNOSIS — H8113 Benign paroxysmal vertigo, bilateral: Secondary | ICD-10-CM | POA: Diagnosis not present

## 2016-11-15 DIAGNOSIS — I10 Essential (primary) hypertension: Secondary | ICD-10-CM | POA: Diagnosis not present

## 2016-11-15 DIAGNOSIS — E1165 Type 2 diabetes mellitus with hyperglycemia: Secondary | ICD-10-CM | POA: Diagnosis not present

## 2016-11-15 DIAGNOSIS — R600 Localized edema: Secondary | ICD-10-CM | POA: Diagnosis not present

## 2016-11-18 ENCOUNTER — Telehealth: Payer: Self-pay | Admitting: Endocrinology

## 2016-11-18 NOTE — Telephone Encounter (Signed)
Not able to receive the two boxes of Insulin Glargine (TOUJEO SOLOSTAR) 300 UNIT/ML [place] ([address]), [place] [phone] (Phone) [phone] (Fax)   Pleas advise

## 2016-11-18 NOTE — Telephone Encounter (Signed)
I contacted the [facility] regarding the toujeo prescription. The pharmacy advised me the patient was able to pick up one box because the patient's insurance will only cover a 30 day supply and two boxes would be over a 30 day supply. Patient advised of this message via voicemail.

## 2016-11-22 ENCOUNTER — Ambulatory Visit: Payer: BLUE CROSS/BLUE SHIELD | Admitting: Endocrinology

## 2016-12-02 MED ORDER — INSULIN GLARGINE 300 UNIT/ML ~~LOC~~ SOPN: 80.0000 [IU] | PEN_INJECTOR | SUBCUTANEOUS | 11 refills | Status: DC

## 2016-12-02 NOTE — Telephone Encounter (Signed)
Insurance will not let her pick up her meds because Dr Loanne Drilling did not change the dosage on the box for 80 units.

## 2016-12-02 NOTE — Telephone Encounter (Signed)
Ok, I have sent for 6 pens, which is 1 month

## 2016-12-06 DIAGNOSIS — E113511 Type 2 diabetes mellitus with proliferative diabetic retinopathy with macular edema, right eye: Secondary | ICD-10-CM | POA: Diagnosis not present

## 2016-12-06 DIAGNOSIS — Z961 Presence of intraocular lens: Secondary | ICD-10-CM | POA: Diagnosis not present

## 2016-12-06 DIAGNOSIS — E113592 Type 2 diabetes mellitus with proliferative diabetic retinopathy without macular edema, left eye: Secondary | ICD-10-CM | POA: Diagnosis not present

## 2016-12-06 DIAGNOSIS — H35371 Puckering of macula, right eye: Secondary | ICD-10-CM | POA: Diagnosis not present

## 2016-12-06 DIAGNOSIS — H4311 Vitreous hemorrhage, right eye: Secondary | ICD-10-CM | POA: Diagnosis not present

## 2016-12-06 DIAGNOSIS — E113521 Type 2 diabetes mellitus with proliferative diabetic retinopathy with traction retinal detachment involving the macula, right eye: Secondary | ICD-10-CM | POA: Diagnosis not present

## 2016-12-20 ENCOUNTER — Ambulatory Visit: Payer: BLUE CROSS/BLUE SHIELD | Admitting: Endocrinology

## 2016-12-26 ENCOUNTER — Ambulatory Visit: Payer: BLUE CROSS/BLUE SHIELD | Admitting: Endocrinology

## 2017-01-17 DIAGNOSIS — I10 Essential (primary) hypertension: Secondary | ICD-10-CM | POA: Diagnosis not present

## 2017-01-17 DIAGNOSIS — F419 Anxiety disorder, unspecified: Secondary | ICD-10-CM | POA: Diagnosis not present

## 2017-01-17 DIAGNOSIS — E1165 Type 2 diabetes mellitus with hyperglycemia: Secondary | ICD-10-CM | POA: Diagnosis not present

## 2017-01-17 DIAGNOSIS — E11319 Type 2 diabetes mellitus with unspecified diabetic retinopathy without macular edema: Secondary | ICD-10-CM | POA: Diagnosis not present

## 2017-01-17 DIAGNOSIS — H8113 Benign paroxysmal vertigo, bilateral: Secondary | ICD-10-CM | POA: Diagnosis not present

## 2017-01-31 DIAGNOSIS — R809 Proteinuria, unspecified: Secondary | ICD-10-CM | POA: Diagnosis not present

## 2017-03-14 ENCOUNTER — Encounter: Payer: Self-pay | Admitting: Endocrinology

## 2017-03-14 ENCOUNTER — Ambulatory Visit (INDEPENDENT_AMBULATORY_CARE_PROVIDER_SITE_OTHER): Payer: BLUE CROSS/BLUE SHIELD | Admitting: Endocrinology

## 2017-03-14 VITALS — BP 148/80 | HR 80 | Ht 65.0 in | Wt 240.0 lb

## 2017-03-14 DIAGNOSIS — E1122 Type 2 diabetes mellitus with diabetic chronic kidney disease: Secondary | ICD-10-CM | POA: Diagnosis not present

## 2017-03-14 DIAGNOSIS — N183 Chronic kidney disease, stage 3 (moderate): Secondary | ICD-10-CM

## 2017-03-14 DIAGNOSIS — Z794 Long term (current) use of insulin: Secondary | ICD-10-CM

## 2017-03-14 LAB — POCT GLYCOSYLATED HEMOGLOBIN (HGB A1C): Hemoglobin A1C: 13.1

## 2017-03-14 MED ORDER — DULAGLUTIDE 0.75 MG/0.5ML ~~LOC~~ SOAJ: 0.7500 mg | SUBCUTANEOUS | 11 refills | Status: DC

## 2017-03-14 NOTE — Progress Notes (Signed)
Subjective:    Patient ID: Latasha [name], female    DOB: 1956-04-30, 61 y.o.   MRN: [id number]  HPI  Pt returns for f/u of diabetes mellitus: DM type: Insulin-requiring type 2 Dx'ed: 7412 Complications: polyneuropathy, PDR, and renal insuff Therapy: insulin since dx, and 2 oral meds.   GDM: never DKA: never Severe hypoglycemia: never Pancreatitis: never Pancreatic imaging (CT, 2017): Pancreas shows moderate fatty replacement. Other: she takes qd insulin, due to noncompliance Interval history: no cbg record, but states cbg's are in the 200's.  Main symptom is chronic pain of the LE's.  She says she misses the insulin approx once per week.   Past Medical History:  Diagnosis Date  . BPPV (benign paroxysmal positional vertigo)   . Diabetes mellitus   . Hyperlipidemia   . Hypertension   . Palpitations     Past Surgical History:  Procedure Laterality Date  . ABDOMINAL HYSTERECTOMY    . EYE SURGERY     cataract/left eye, right eye had bleeding in back  . KNEE SURGERY    . TRIGGER FINGER RELEASE      Social History   Social History  . Marital status: Married    Spouse name: N/A  . Number of children: N/A  . Years of education: N/A   Occupational History  . Not on file.   Social History Main Topics  . Smoking status: Never Smoker  . Smokeless tobacco: Never Used  . Alcohol use Yes     Comment:  rare  . Drug use: No  . Sexual activity: Not on file   Other Topics Concern  . Not on file   Social History Narrative  . No narrative on file    Current Outpatient Prescriptions on File Prior to Visit  Medication Sig Dispense Refill  . amLODipine (NORVASC) 10 MG tablet Take 10 mg by mouth daily. Reported on 02/08/2016    . aspirin EC 81 MG tablet Take 81 mg by mouth daily as needed (rapid heart beat).    . furosemide (LASIX) 20 MG tablet Take 20 mg by mouth 2 (two) times daily.     Marland Kitchen glipiZIDE (GLUCOTROL XL) 10 MG 24 hr tablet Take 10 mg by mouth 2 (two) times daily.      . Insulin Glargine (TOUJEO SOLOSTAR) 300 UNIT/ML [facility] Inject 80 Units into the skin every morning. And pen needles 1/day 6 pen 11  . metFORMIN (GLUCOPHAGE) 1000 MG tablet Take 1 tablet by mouth 2 (two) times daily.    . methocarbamol (ROBAXIN) 500 MG tablet Take 1 tablet (500 mg total) by mouth 2 (two) times daily. 20 tablet 0  . metoprolol succinate (TOPROL-XL) 25 MG 24 hr tablet Take 1 tablet (25 mg total) by mouth daily. 30 tablet 0  . olmesartan (BENICAR) 40 MG tablet Take 40 mg by mouth daily.    . rosuvastatin (CRESTOR) 10 MG tablet Take 10 mg by mouth daily.    . traMADol (ULTRAM) 50 MG tablet Take 1 tablet (50 mg total) by mouth every 6 (six) hours as needed. 15 tablet 0  . ondansetron (ZOFRAN) 4 MG tablet Take 1 tablet (4 mg total) by mouth every 6 (six) hours. (Patient not taking: Reported on 03/14/2017) 12 tablet 0  . potassium chloride SA (K-DUR,KLOR-CON) 20 MEQ tablet Take 2 tablets (40 mEq total) by mouth 2 (two) times daily. 28 tablet 0  . quinapril-hydrochlorothiazide (ACCURETIC) 20-25 MG per tablet Take 1 tablet by mouth daily. Reported on 02/08/2016  No current facility-administered medications on file prior to visit.     No Known Allergies  Family History  Problem Relation Age of Onset  . Heart disease Father   . Hypertension Father   . Alzheimer's disease Mother   . Diabetes Mother   . Diabetes Sister   . Dementia Other     BP (!) 148/80   Pulse 80   Ht 5\' 5"  (1.651 m)   Wt 240 lb (108.9 kg)   SpO2 91%   BMI 39.94 kg/m   Review of Systems She denies hypoglycemia.  She has gained weight.      Objective:   Physical Exam VITAL SIGNS:  See vs page GENERAL: no distress Pulses: dorsalis pedis intact bilat.   MSK: no deformity of the feet CV: 1+ bilat leg edema Skin:  no ulcer on the feet.  normal color and temp on the feet.   Neuro: sensation is intact to touch on the feet, but decreased from normal.   Ext: There is bilateral onychomycosis of the  toenails.     A1c=13.1%    Assessment & Plan:  Insulin-requiring type 2 DM, with renal insufficiency.  She needs increased rx.   Obesity: worse: she says she cannot afford weight loss surgery now.  Patient Instructions  check your blood sugar twice a day.  vary the time of day when you check, between before the 3 meals, and at bedtime.  also check if you have symptoms of your blood sugar being too high or too low.  please keep a record of the readings and bring it to your next appointment here (or you can bring the meter itself).  You can write it on any piece of paper.  please call us sooner if your blood sugar goes below 70, or if you have a lot of readings over 200.   For now, please: continue the same toujeo, 80 units each morning, and:  Please continue the same diabetes pills for now, and: I have sent a prescription to your pharmacy, to add "trulicity."  Please come back for a follow-up appointment in 1 month.  Please call us next week, to tell us how the blood sugar is doing.

## 2017-03-14 NOTE — Patient Instructions (Addendum)
check your blood sugar twice a day.  vary the time of day when you check, between before the 3 meals, and at bedtime.  also check if you have symptoms of your blood sugar being too high or too low.  please keep a record of the readings and bring it to your next appointment here (or you can bring the meter itself).  You can write it on any piece of paper.  please call us sooner if your blood sugar goes below 70, or if you have a lot of readings over 200.   For now, please: continue the same toujeo, 80 units each morning, and:  Please continue the same diabetes pills for now, and: I have sent a prescription to your pharmacy, to add "trulicity."  Please come back for a follow-up appointment in 1 month.  Please call us next week, to tell us how the blood sugar is doing.

## 2017-03-17 ENCOUNTER — Telehealth: Payer: Self-pay

## 2017-03-17 NOTE — Telephone Encounter (Signed)
Patient left medications in the room on 03/14/17 I have made several attempts to reach the patient and have been unsuccessful- prescriptions are in the patients nike lunch box in the lost and found at the front desk

## 2017-03-19 ENCOUNTER — Emergency Department (HOSPITAL_COMMUNITY)
Admission: EM | Admit: 2017-03-19 | Discharge: 2017-03-19 | Disposition: A | Discharge status: Home / Self Care | Payer: Self-pay | Attending: Emergency Medicine | Admitting: Emergency Medicine

## 2017-03-19 ENCOUNTER — Encounter (HOSPITAL_COMMUNITY): Payer: Self-pay | Admitting: Emergency Medicine

## 2017-03-19 ENCOUNTER — Emergency Department (HOSPITAL_COMMUNITY): Payer: Self-pay

## 2017-03-19 DIAGNOSIS — R0789 Other chest pain: Secondary | ICD-10-CM | POA: Insufficient documentation

## 2017-03-19 DIAGNOSIS — N179 Acute kidney failure, unspecified: Secondary | ICD-10-CM | POA: Diagnosis not present

## 2017-03-19 DIAGNOSIS — R079 Chest pain, unspecified: Secondary | ICD-10-CM

## 2017-03-19 DIAGNOSIS — Z7984 Long term (current) use of oral hypoglycemic drugs: Secondary | ICD-10-CM | POA: Insufficient documentation

## 2017-03-19 DIAGNOSIS — I10 Essential (primary) hypertension: Secondary | ICD-10-CM | POA: Insufficient documentation

## 2017-03-19 DIAGNOSIS — Z79899 Other long term (current) drug therapy: Secondary | ICD-10-CM | POA: Insufficient documentation

## 2017-03-19 DIAGNOSIS — E876 Hypokalemia: Secondary | ICD-10-CM | POA: Insufficient documentation

## 2017-03-19 DIAGNOSIS — E785 Hyperlipidemia, unspecified: Secondary | ICD-10-CM | POA: Insufficient documentation

## 2017-03-19 DIAGNOSIS — E119 Type 2 diabetes mellitus without complications: Secondary | ICD-10-CM | POA: Insufficient documentation

## 2017-03-19 DIAGNOSIS — Z7982 Long term (current) use of aspirin: Secondary | ICD-10-CM | POA: Insufficient documentation

## 2017-03-19 DIAGNOSIS — R0602 Shortness of breath: Secondary | ICD-10-CM | POA: Diagnosis not present

## 2017-03-19 DIAGNOSIS — R791 Abnormal coagulation profile: Secondary | ICD-10-CM | POA: Diagnosis not present

## 2017-03-19 DIAGNOSIS — R7989 Other specified abnormal findings of blood chemistry: Secondary | ICD-10-CM

## 2017-03-19 LAB — CBC
HEMATOCRIT: 34.8 % — AB (ref 36.0–46.0)
Hemoglobin: 11.7 g/dL — ABNORMAL LOW (ref 12.0–15.0)
MCH: 28.5 pg (ref 26.0–34.0)
MCHC: 33.6 g/dL (ref 30.0–36.0)
MCV: 84.7 fL (ref 78.0–100.0)
Platelets: 289 10*3/uL (ref 150–400)
RBC: 4.11 MIL/uL (ref 3.87–5.11)
RDW: 12.7 % (ref 11.5–15.5)
WBC: 7 10*3/uL (ref 4.0–10.5)

## 2017-03-19 LAB — BASIC METABOLIC PANEL
Anion gap: 10 (ref 5–15)
BUN: 21 mg/dL — ABNORMAL HIGH (ref 6–20)
CALCIUM: 8 mg/dL — AB (ref 8.9–10.3)
CO2: 26 mmol/L (ref 22–32)
Chloride: 101 mmol/L (ref 101–111)
Creatinine, Ser: 1.66 mg/dL — ABNORMAL HIGH (ref 0.44–1.00)
GFR calc Af Amer: 38 mL/min — ABNORMAL LOW (ref >60)
GFR, EST NON AFRICAN AMERICAN: 33 mL/min — AB (ref >60)
GLUCOSE: 175 mg/dL — AB (ref 65–99)
Potassium: 2.9 mmol/L — ABNORMAL LOW (ref 3.5–5.1)
Sodium: 137 mmol/L (ref 135–145)

## 2017-03-19 LAB — URINALYSIS, ROUTINE W REFLEX MICROSCOPIC
BACTERIA UA: NONE SEEN
BILIRUBIN URINE: NEGATIVE
Glucose, UA: >=500 mg/dL — AB
Ketones, ur: NEGATIVE mg/dL
LEUKOCYTES UA: NEGATIVE
Nitrite: NEGATIVE
SPECIFIC GRAVITY, URINE: 1.009 (ref 1.005–1.030)
pH: 7 (ref 5.0–8.0)

## 2017-03-19 LAB — I-STAT TROPONIN, ED: TROPONIN I, POC: 0.02 ng/mL (ref 0.00–0.08)

## 2017-03-19 LAB — D-DIMER, QUANTITATIVE (NOT AT ARMC): D DIMER QUANT: 1.46 ug{FEU}/mL — AB (ref 0.00–0.50)

## 2017-03-19 MED ORDER — POTASSIUM CHLORIDE CRYS ER 20 MEQ PO TBCR: 20.0000 meq | EXTENDED_RELEASE_TABLET | Freq: Every day | ORAL | 0 refills | Status: DC

## 2017-03-19 MED ORDER — POTASSIUM CHLORIDE CRYS ER 20 MEQ PO TBCR
40.0000 meq | EXTENDED_RELEASE_TABLET | Freq: Once | ORAL | Status: AC
  Administered 2017-03-19: 40 meq via ORAL
  Filled 2017-03-19: qty 2

## 2017-03-19 MED ORDER — SODIUM CHLORIDE 0.9 % IV BOLUS (SEPSIS)
1000.0000 mL | Freq: Once | INTRAVENOUS | Status: AC
  Administered 2017-03-19: 1000 mL via INTRAVENOUS

## 2017-03-19 MED ORDER — TECHNETIUM TO 99M ALBUMIN AGGREGATED
4.2000 | Freq: Once | INTRAVENOUS | Status: AC | PRN
  Administered 2017-03-19: 4.2 via INTRAVENOUS

## 2017-03-19 MED ORDER — POTASSIUM CHLORIDE 10 MEQ/100ML IV SOLN
10.0000 meq | Freq: Once | INTRAVENOUS | Status: AC
  Administered 2017-03-19: 10 meq via INTRAVENOUS
  Filled 2017-03-19: qty 100

## 2017-03-19 MED ORDER — TECHNETIUM TC 99M DIETHYLENETRIAME-PENTAACETIC ACID: 4.2000 | Freq: Once | INTRAVENOUS | Status: DC | PRN

## 2017-03-19 MED ORDER — TECHNETIUM TC 99M DIETHYLENETRIAME-PENTAACETIC ACID: 32.4000 | Freq: Once | INTRAVENOUS | Status: DC | PRN

## 2017-03-19 MED ORDER — ACETAMINOPHEN 325 MG PO TABS
650.0000 mg | ORAL_TABLET | Freq: Once | ORAL | Status: AC
  Administered 2017-03-19: 650 mg via ORAL
  Filled 2017-03-19: qty 2

## 2017-03-19 MED ORDER — MAGNESIUM SULFATE 2 GM/50ML IV SOLN
2.0000 g | Freq: Once | INTRAVENOUS | Status: AC
  Administered 2017-03-19: 2 g via INTRAVENOUS
  Filled 2017-03-19: qty 50

## 2017-03-19 NOTE — ED Notes (Signed)
Pt is in stable condition upon d/c and ambulates from ED. 

## 2017-03-19 NOTE — Discharge Instructions (Signed)
Your heart workup is reassuring, and near scan does not show evidence of a blood clot. Your symptoms are likely related to low potassium. Please follow-up with your primary care doctor in 1 week for recheck on your potassium.  Return without fail for worsening symptoms, including difficulty breathing, passing out, confusion, worsening pain, intractable vomiting, or any other symptoms concerning to you

## 2017-03-19 NOTE — ED Provider Notes (Signed)
Please see previous physicians note regarding patient's presenting history and physical, initial ED course, and associated medical decision making.  V/Q scan normal and negative for PE. Given potassium and magnesium repletion, tolerating by mouth and feels improved. We'll discharge home with some potassium supplementation and close PCP follow-up. Strict return and follow-up instructions reviewed. She expressed understanding of all discharge instructions and felt comfortable with the plan of care.    Forde Dandy, MD 03/19/17 [phone]

## 2017-03-19 NOTE — ED Provider Notes (Signed)
[place] DEPT Provider Note   CSN: [id number] Arrival date & time: 03/19/17  0502     History   Chief Complaint Chief Complaint  Patient presents with  . Chest Pain  . Shortness of Breath    HPI Latasha [name] is a 61 y.o. female.  HPI  This a 61 year old female with diabetes, hypertension, hyperlipidemia, palpitations who presents with multiple complaints. Primarily she presents with left sided chest pain which she describes as sharp and nonradiating. It comes and goes spontaneously. It lasts for seconds. It is not pleuritic in nature. She denies any diaphoresis or shortness of breath. She also reports "pain all over including my fingertips." She reports dysuria. No fevers. She reports left leg pain and swelling. No history of DVTs, recent travel, recent hospitalization. Notably hypertensive upon arrival. Did take her blood pressure medications just prior to coming.  Past Medical History:  Diagnosis Date  . BPPV (benign paroxysmal positional vertigo)   . Diabetes mellitus   . Hyperlipidemia   . Hypertension   . Palpitations     Patient Active Problem List   Diagnosis Date Noted  . Hyperlipemia 01/19/2014  . Palpitations 01/19/2014  . PVC's (premature ventricular contractions) 01/19/2014  . SCIATICA, RIGHT 12/12/2010  . GASTROESOPHAGEAL REFLUX DISEASE 02/09/2010  . LEG CRAMPS 02/09/2010  . OBESITY 12/01/2009  . VITAMIN D DEFICIENCY 10/13/2009  . CONSTIPATION 10/13/2009  . LIVER FUNCTION TESTS, ABNORMAL, HX OF 10/13/2009  . Essential hypertension 08/24/2009  . SHOULDER PAIN, RIGHT, CHRONIC 08/24/2009  . Diabetes ([place]) 10/08/1991    Past Surgical History:  Procedure Laterality Date  . ABDOMINAL HYSTERECTOMY    . EYE SURGERY     cataract/left eye, right eye had bleeding in back  . KNEE SURGERY    . TRIGGER FINGER RELEASE      OB History    No data available       Home Medications    Prior to Admission medications   Medication Sig Start Date End  Date Taking? Authorizing Provider  amLODipine (NORVASC) 10 MG tablet Take 10 mg by mouth daily. Reported on 02/08/2016   Yes [provider]  aspirin EC 81 MG tablet Take 81 mg by mouth daily as needed (rapid heart beat).   Yes [provider]  Dulaglutide (TRULICITY) 7.34 KA/7.6OT SOPN Inject 0.75 mg into the skin once a week. 03/14/17  Yes Renato Shin, MD  furosemide (LASIX) 20 MG tablet Take 20 mg by mouth 2 (two) times daily.    Yes [provider]  glipiZIDE (GLUCOTROL XL) 10 MG 24 hr tablet Take 10 mg by mouth 2 (two) times daily.    Yes [provider]  Insulin Glargine (TOUJEO SOLOSTAR) 300 UNIT/ML SOPN Inject 80 Units into the skin every morning. And pen needles 1/day 12/02/16  Yes Renato Shin, MD  LORazepam (ATIVAN) 0.5 MG tablet Take 0.5 mg by mouth every morning.    Yes [provider]  metFORMIN (GLUCOPHAGE) 1000 MG tablet Take 1 tablet by mouth 2 (two) times daily. 04/26/16  Yes [provider]  methocarbamol (ROBAXIN) 500 MG tablet Take 1 tablet (500 mg total) by mouth 2 (two) times daily. Patient taking differently: Take 500 mg by mouth 2 (two) times daily as needed for muscle spasms.  07/13/16  Yes Hyman Bible, PA-C  metoprolol succinate (TOPROL-XL) 25 MG 24 hr tablet Take 1 tablet (25 mg total) by mouth daily. 05/26/16  Yes Julianne Rice, MD  olmesartan (BENICAR) 40 MG tablet Take 40  mg by mouth daily.   Yes [provider]  ondansetron (ZOFRAN) 4 MG tablet Take 1 tablet (4 mg total) by mouth every 6 (six) hours. Patient taking differently: Take 4 mg by mouth every 6 (six) hours as needed for nausea or vomiting.  07/13/16  Yes Laisure, Nira Conn, PA-C  potassium chloride SA (K-DUR,KLOR-CON) 20 MEQ tablet Take 2 tablets (40 mEq total) by mouth 2 (two) times daily. 07/13/16 03/19/17 Yes Mesner, Corene Cornea, MD  quinapril-hydrochlorothiazide (ACCURETIC) 20-25 MG per tablet Take 1 tablet by mouth daily. Reported on 02/08/2016   Yes  [provider]  rosuvastatin (CRESTOR) 10 MG tablet Take 10 mg by mouth daily.   Yes [provider]  traMADol (ULTRAM) 50 MG tablet Take 1 tablet (50 mg total) by mouth every 6 (six) hours as needed. Patient taking differently: Take 50 mg by mouth every 6 (six) hours as needed for moderate pain.  07/13/16  Yes Hyman Bible, PA-C    Family History Family History  Problem Relation Age of Onset  . Heart disease Father   . Hypertension Father   . Alzheimer's disease Mother   . Diabetes Mother   . Diabetes Sister   . Dementia Other     Social History Social History  Substance Use Topics  . Smoking status: Never Smoker  . Smokeless tobacco: Never Used  . Alcohol use Yes     Comment:  rare     Allergies   Patient has no known allergies.   Review of Systems Review of Systems  Constitutional: Negative for fever.  Respiratory: Negative for shortness of breath.   Cardiovascular: Positive for chest pain and palpitations. Negative for leg swelling.  Gastrointestinal: Positive for nausea and vomiting. Negative for abdominal pain, constipation and diarrhea.  Musculoskeletal: Positive for arthralgias and myalgias.  Skin: Negative for rash.  Neurological: Negative for light-headedness and headaches.  All other systems reviewed and are negative.    Physical Exam Updated Vital Signs BP (!) 174/87   Pulse 75   Temp 98 F (36.7 C) (Oral)   Resp 17   Wt 108.9 kg (240 lb)   SpO2 97%   BMI 39.94 kg/m   Physical Exam  Constitutional: She is oriented to person, place, and time. She appears well-developed and well-nourished.  Obese, no acute distress  HENT:  Head: Normocephalic and atraumatic.  Cardiovascular: Normal rate, regular rhythm and normal heart sounds.   No murmur heard. Pulmonary/Chest: Effort normal and breath sounds normal. No respiratory distress. She has no wheezes. She exhibits tenderness.  Tenderness palpation left chest wall, no rash or  overlying skin changes, no crepitus  Abdominal: Soft. Bowel sounds are normal. There is no tenderness.  Musculoskeletal:  Trace, symmetric, bilateral lower extremity edema, no tenderness to palpation to the calves  Neurological: She is alert and oriented to person, place, and time.  Skin: Skin is warm and dry.  Psychiatric: She has a normal mood and affect.  Nursing note and vitals reviewed.    ED Treatments / Results  Labs (all labs ordered are listed, but only abnormal results are displayed) Labs Reviewed  BASIC METABOLIC PANEL - Abnormal; Notable for the following:       Result Value   Potassium 2.9 (*)    Glucose, Bld 175 (*)    BUN 21 (*)    Creatinine, Ser 1.66 (*)    Calcium 8.0 (*)    GFR calc non Af Amer 33 (*)    GFR calc Af Wyvonnia Lora  38 (*)    All other components within normal limits  CBC - Abnormal; Notable for the following:    Hemoglobin 11.7 (*)    HCT 34.8 (*)    All other components within normal limits  URINALYSIS, ROUTINE W REFLEX MICROSCOPIC - Abnormal; Notable for the following:    Color, Urine STRAW (*)    Glucose, UA >=500 (*)    Hgb urine dipstick SMALL (*)    Protein, ur >=300 (*)    Squamous Epithelial / LPF 0-5 (*)    All other components within normal limits  D-DIMER, QUANTITATIVE (NOT AT [facility]) - Abnormal; Notable for the following:    D-Dimer, Quant 1.46 (*)    All other components within normal limits  I-STAT TROPOININ, ED    EKG  EKG Interpretation  Date/Time:  Wednesday March 19 2017 05:13:53 EDT Ventricular Rate:  75 PR Interval:    QRS Duration: 97 QT Interval:  392 QTC Calculation: 438 R Axis:   -2 Text Interpretation:  Sinus rhythm Abnormal R-wave progression, early transition LVH with secondary repolarization abnormality Confirmed by Thayer Jew [phone]) on 03/19/2017 5:16:28 AM Also confirmed by Thayer Jew [phone]), editor Hattie Perch ([id number])  on 03/19/2017 7:25:03 AM       Radiology Dg Chest 2 View  Result  Date: 03/19/2017 CLINICAL DATA:  Acute onset of left-sided chest pain. Initial encounter. EXAM: CHEST  2 VIEW COMPARISON:  Chest radiograph performed 05/26/2016 FINDINGS: The lungs are well-aerated and clear. There is no evidence of focal opacification, pleural effusion or pneumothorax. The heart is normal in size; the mediastinal contour is within normal limits. No acute osseous abnormalities are seen. IMPRESSION: No acute cardiopulmonary process seen. Electronically Signed   By: Garald Balding M.D.   On: 03/19/2017 06:22    Procedures Procedures (including critical care time)  Medications Ordered in ED Medications  magnesium sulfate IVPB 2 g 50 mL (not administered)  acetaminophen (TYLENOL) tablet 650 mg (650 mg Oral Given 03/19/17 0603)  sodium chloride 0.9 % bolus 1,000 mL (1,000 mLs Intravenous New Bag/Given 03/19/17 0627)  potassium chloride 10 mEq in 100 mL IVPB (0 mEq Intravenous Stopped 03/19/17 0729)  potassium chloride SA (K-DUR,KLOR-CON) CR tablet 40 mEq (40 mEq Oral Given 03/19/17 9390)     Initial Impression / Assessment and Plan / ED Course  I have reviewed the triage vital signs and the nursing notes.  Pertinent labs & imaging results that were available during my care of the patient were reviewed by me and considered in my medical decision making (see chart for details).     Patient presents with multiple complaints including generalized myalgias, left-sided atypical chest pain on the left leg pain. She is generally nontoxic. Vital signs notable for hypertension. However, patient did take her morning medications just prior to arrival. EKG is nonischemic. Chest x-ray is reassuring. Initial troponin negative. Screening d-dimer was sent and is positive. Unfortunately, patient has mild acute kidney injury and hypokalemia. This could explain her generalized myalgias. She was given fluids and potassium. She will wait for a VQ scan this morning.  Final Clinical Impressions(s) / ED  Diagnoses   Final diagnoses:  Chest pain  Atypical chest pain  Hypokalemia  Positive D dimer  AKI (acute kidney injury) [facility])    New Prescriptions New Prescriptions   No medications on file     Merryl Hacker, MD 03/19/17 [phone]

## 2017-03-19 NOTE — ED Triage Notes (Signed)
Patient here with chest pain and shortness of breath, mild shortness of breath.  She states that it is more in her left side, having some palpitations, mild headache, hypertensive, nausea.  She has multiple complaints along with the chest pain.  She is also having urinary retention/having to go all the time.

## 2017-03-19 NOTE — ED Notes (Signed)
Pt transported to [place] 

## 2017-03-19 NOTE — ED Notes (Signed)
Nuc med called for update on when VQ scan will be completed, estimated time frame 1-2 hrs, family updated

## 2017-03-20 ENCOUNTER — Telehealth: Payer: Self-pay | Admitting: Endocrinology

## 2017-03-20 NOTE — Telephone Encounter (Signed)
FYI-see below- 

## 2017-03-20 NOTE — Telephone Encounter (Signed)
OK, the next step would be to double the trulicity and stop the glipizide.  Ok with you?

## 2017-03-20 NOTE — Telephone Encounter (Signed)
Patient wants to notify that trulicity is working, and she is going to go with that,.

## 2017-03-21 MED ORDER — DULAGLUTIDE 1.5 MG/0.5ML ~~LOC~~ SOAJ: SUBCUTANEOUS | 2 refills | Status: DC

## 2017-03-21 NOTE — Telephone Encounter (Signed)
Patient agreed to starting the trulicity at 1.5 mg daily and discontinuing the glipizide. Rx submitted.for the 1.5 mg weekly.

## 2017-03-28 DIAGNOSIS — N182 Chronic kidney disease, stage 2 (mild): Secondary | ICD-10-CM | POA: Diagnosis not present

## 2017-03-28 DIAGNOSIS — E1165 Type 2 diabetes mellitus with hyperglycemia: Secondary | ICD-10-CM | POA: Diagnosis not present

## 2017-03-28 DIAGNOSIS — I10 Essential (primary) hypertension: Secondary | ICD-10-CM | POA: Diagnosis not present

## 2017-03-28 DIAGNOSIS — R809 Proteinuria, unspecified: Secondary | ICD-10-CM | POA: Diagnosis not present

## 2017-04-18 ENCOUNTER — Telehealth: Payer: Self-pay | Admitting: Endocrinology

## 2017-04-18 MED ORDER — DULAGLUTIDE 0.75 MG/0.5ML ~~LOC~~ SOAJ: 0.7500 mg | SUBCUTANEOUS | 11 refills | Status: DC

## 2017-04-18 MED ORDER — INSULIN GLARGINE 300 UNIT/ML ~~LOC~~ SOPN: 80.0000 [IU] | PEN_INJECTOR | SUBCUTANEOUS | 11 refills | Status: DC

## 2017-04-18 NOTE — Telephone Encounter (Signed)
Medication assistance contacted about assistance for trulicity and toujeo.

## 2017-04-18 NOTE — Telephone Encounter (Signed)
Ok, I have sent a prescription to your pharmacy, to reduce the trulicity to the smaller dosage.  Hold on to any 1.5 mg pens you have, as the nausea goes away with time, and we should re-increase in the future if we can. I'll see you next time.

## 2017-04-18 NOTE — Telephone Encounter (Signed)
The medication assistance program called to ensure that a fax was received this morning for the patient to get medications refilled before the weekend. Please check for fax and process refills before the end of the day.

## 2017-04-18 NOTE — Telephone Encounter (Signed)
See message and please advise, Thanks!  

## 2017-04-18 NOTE — Telephone Encounter (Signed)
Patient called to advise that the Dulaglutide (TRULICITY) 1.5 XY/3.3XO SOPN [[id number]]   is making her sick. She is asking if she has to continue taking it. Are there alternatives? Verified home #

## 2017-04-18 NOTE — Telephone Encounter (Signed)
Patient notified of message and agreed with treatment plan.

## 2017-04-25 ENCOUNTER — Ambulatory Visit: Payer: BLUE CROSS/BLUE SHIELD | Admitting: Endocrinology

## 2017-05-01 ENCOUNTER — Encounter (HOSPITAL_COMMUNITY): Payer: Self-pay | Admitting: Emergency Medicine

## 2017-05-01 ENCOUNTER — Emergency Department (HOSPITAL_COMMUNITY): Payer: BLUE CROSS/BLUE SHIELD

## 2017-05-01 ENCOUNTER — Emergency Department (HOSPITAL_COMMUNITY)
Admission: EM | Admit: 2017-05-01 | Discharge: 2017-05-02 | Disposition: A | Discharge status: Home / Self Care | Payer: BLUE CROSS/BLUE SHIELD | Attending: Emergency Medicine | Admitting: Emergency Medicine

## 2017-05-01 ENCOUNTER — Other Ambulatory Visit: Payer: Self-pay

## 2017-05-01 DIAGNOSIS — Z794 Long term (current) use of insulin: Secondary | ICD-10-CM | POA: Diagnosis not present

## 2017-05-01 DIAGNOSIS — R109 Unspecified abdominal pain: Secondary | ICD-10-CM | POA: Diagnosis not present

## 2017-05-01 DIAGNOSIS — R1032 Left lower quadrant pain: Secondary | ICD-10-CM | POA: Insufficient documentation

## 2017-05-01 DIAGNOSIS — Z7901 Long term (current) use of anticoagulants: Secondary | ICD-10-CM | POA: Diagnosis not present

## 2017-05-01 DIAGNOSIS — Z79899 Other long term (current) drug therapy: Secondary | ICD-10-CM | POA: Diagnosis not present

## 2017-05-01 DIAGNOSIS — E119 Type 2 diabetes mellitus without complications: Secondary | ICD-10-CM | POA: Insufficient documentation

## 2017-05-01 DIAGNOSIS — M5442 Lumbago with sciatica, left side: Secondary | ICD-10-CM | POA: Diagnosis not present

## 2017-05-01 DIAGNOSIS — M5432 Sciatica, left side: Secondary | ICD-10-CM | POA: Diagnosis not present

## 2017-05-01 DIAGNOSIS — R079 Chest pain, unspecified: Secondary | ICD-10-CM | POA: Diagnosis not present

## 2017-05-01 DIAGNOSIS — I10 Essential (primary) hypertension: Secondary | ICD-10-CM

## 2017-05-01 LAB — URINALYSIS, ROUTINE W REFLEX MICROSCOPIC
Bilirubin Urine: NEGATIVE
HGB URINE DIPSTICK: NEGATIVE
Ketones, ur: NEGATIVE mg/dL
Leukocytes, UA: NEGATIVE
Nitrite: NEGATIVE
PH: 5 (ref 5.0–8.0)
Protein, ur: >=300 mg/dL — AB
Specific Gravity, Urine: 1.014 (ref 1.005–1.030)

## 2017-05-01 LAB — BASIC METABOLIC PANEL
Anion gap: 9 (ref 5–15)
BUN: 32 mg/dL — AB (ref 6–20)
CO2: 21 mmol/L — ABNORMAL LOW (ref 22–32)
Calcium: 8.4 mg/dL — ABNORMAL LOW (ref 8.9–10.3)
Chloride: 103 mmol/L (ref 101–111)
Creatinine, Ser: 2.25 mg/dL — ABNORMAL HIGH (ref 0.44–1.00)
GFR calc non Af Amer: 23 mL/min — ABNORMAL LOW (ref >60)
GFR, EST AFRICAN AMERICAN: 26 mL/min — AB (ref >60)
Glucose, Bld: 359 mg/dL — ABNORMAL HIGH (ref 65–99)
POTASSIUM: 3.7 mmol/L (ref 3.5–5.1)
SODIUM: 133 mmol/L — AB (ref 135–145)

## 2017-05-01 LAB — CBC
HEMATOCRIT: 34.1 % — AB (ref 36.0–46.0)
Hemoglobin: 11.5 g/dL — ABNORMAL LOW (ref 12.0–15.0)
MCH: 27.7 pg (ref 26.0–34.0)
MCHC: 33.7 g/dL (ref 30.0–36.0)
MCV: 82.2 fL (ref 78.0–100.0)
Platelets: 297 10*3/uL (ref 150–400)
RBC: 4.15 MIL/uL (ref 3.87–5.11)
RDW: 12.4 % (ref 11.5–15.5)
WBC: 6 10*3/uL (ref 4.0–10.5)

## 2017-05-01 LAB — I-STAT TROPONIN, ED: Troponin i, poc: 0.02 ng/mL (ref 0.00–0.08)

## 2017-05-01 MED ORDER — MORPHINE SULFATE (PF) 4 MG/ML IV SOLN
4.0000 mg | Freq: Once | INTRAVENOUS | Status: AC
  Administered 2017-05-02: 4 mg via INTRAVENOUS
  Filled 2017-05-01: qty 1

## 2017-05-01 MED ORDER — SODIUM CHLORIDE 0.9 % IV SOLN
Freq: Once | INTRAVENOUS | Status: AC
  Administered 2017-05-02: via INTRAVENOUS

## 2017-05-01 MED ORDER — AMLODIPINE BESYLATE 5 MG PO TABS
10.0000 mg | ORAL_TABLET | Freq: Once | ORAL | Status: AC
  Administered 2017-05-02: 10 mg via ORAL
  Filled 2017-05-01: qty 2

## 2017-05-01 NOTE — ED Notes (Signed)
Pt states she doesn't have all her BP meds

## 2017-05-01 NOTE — ED Notes (Signed)
Patient is resting comfortably. Cardiac monitoring

## 2017-05-01 NOTE — ED Notes (Signed)
Patient transported to CT 

## 2017-05-01 NOTE — ED Triage Notes (Signed)
Pt states she took a nap and woke up with tenderness on her chest and left leg. Denies SOB. States she went on her exercise bike and afterwords felt dizzy.

## 2017-05-01 NOTE — ED Notes (Signed)
EDP notified of elevated BP 

## 2017-05-02 MED ORDER — HYDROCODONE-ACETAMINOPHEN 5-325 MG PO TABS: 1.0000 | ORAL_TABLET | ORAL | 0 refills | Status: DC | PRN

## 2017-05-02 NOTE — ED Notes (Signed)
ED Provider at bedside. 

## 2017-05-02 NOTE — ED Provider Notes (Signed)
[place] DEPT Provider Note   CSN: [id number] Arrival date & time: 05/01/17  1816     History   Chief Complaint Chief Complaint  Patient presents with  . Chest Pain  . Leg Pain    HPI Latasha [name] is a 61 y.o. female.  HPI Patient states she's been having problems with pain in her left leg. She reports she's been diagnosed with peripheral neuropathy she gets pain in her lower leg. She reports however she is also now been getting more pain in her left lower back has been radiating down the upper part of the leg. She reports since she was uncomfortable lower back to [facility] she decided to try working out on her stationary bike. She reports she got on the stationary bike for about 20 minutes in the pain was worse and she felt dizzy. No weakness or numbness. She reports is worse if she pushes on a spot in her back or she moves a certain way.  I noted the patient has hypertensive blood pressure readings. She reports she has been having trouble controlling her blood pressure lately. She is documenting her readings in a laundry and her physician is working on monitoring and managing her blood pressure medications. Past Medical History:  Diagnosis Date  . BPPV (benign paroxysmal positional vertigo)   . Diabetes mellitus   . Hyperlipidemia   . Hypertension   . Palpitations     Patient Active Problem List   Diagnosis Date Noted  . Hyperlipemia 01/19/2014  . Palpitations 01/19/2014  . PVC's (premature ventricular contractions) 01/19/2014  . SCIATICA, RIGHT 12/12/2010  . GASTROESOPHAGEAL REFLUX DISEASE 02/09/2010  . LEG CRAMPS 02/09/2010  . OBESITY 12/01/2009  . VITAMIN D DEFICIENCY 10/13/2009  . CONSTIPATION 10/13/2009  . LIVER FUNCTION TESTS, ABNORMAL, HX OF 10/13/2009  . Essential hypertension 08/24/2009  . SHOULDER PAIN, RIGHT, CHRONIC 08/24/2009  . Diabetes ([place]) 10/08/1991    Past Surgical History:  Procedure Laterality Date  . ABDOMINAL HYSTERECTOMY    . EYE  SURGERY     cataract/left eye, right eye had bleeding in back  . KNEE SURGERY    . TRIGGER FINGER RELEASE      OB History    No data available       Home Medications    Prior to Admission medications   Medication Sig Start Date End Date Taking? Authorizing Provider  Dulaglutide (TRULICITY) 3.23 FT/7.3UK SOPN Inject 0.75 mg into the skin once a week. 04/18/17  Yes Renato Shin, MD  gabapentin (NEURONTIN) 100 MG capsule Take 100 mg by mouth 3 (three) times daily.   Yes [provider]  Insulin Glargine (TOUJEO SOLOSTAR) 300 UNIT/ML SOPN Inject 80 Units into the skin every morning. And pen needles 1/day 04/18/17  Yes Renato Shin, MD  metFORMIN (GLUCOPHAGE) 1000 MG tablet Take 1 tablet by mouth 2 (two) times daily. 04/26/16  Yes [provider]  metoprolol tartrate (LOPRESSOR) 25 MG tablet Take 25 mg by mouth 2 (two) times daily.   Yes [provider]  olmesartan (BENICAR) 40 MG tablet Take 40 mg by mouth daily.   Yes [provider]  rosuvastatin (CRESTOR) 10 MG tablet Take 10 mg by mouth daily.   Yes [provider]  HYDROcodone-acetaminophen (NORCO/VICODIN) 5-325 MG tablet Take 1-2 tablets by mouth every 4 (four) hours as needed for moderate pain or severe pain. 05/02/17   Charlesetta Shanks, MD  methocarbamol (ROBAXIN) 500 MG tablet Take 1 tablet (500 mg total) by mouth 2 (  two) times daily. Patient not taking: Reported on 05/01/2017 07/13/16   Hyman Bible, PA-C  metoprolol succinate (TOPROL-XL) 25 MG 24 hr tablet Take 1 tablet (25 mg total) by mouth daily. Patient not taking: Reported on 05/01/2017 05/26/16   Julianne Rice, MD  ondansetron (ZOFRAN) 4 MG tablet Take 1 tablet (4 mg total) by mouth every 6 (six) hours. Patient not taking: Reported on 05/01/2017 07/13/16   Hyman Bible, PA-C  potassium chloride SA (K-DUR,KLOR-CON) 20 MEQ tablet Take 1 tablet (20 mEq total) by mouth daily. Patient not taking: Reported on 05/01/2017 03/19/17    Forde Dandy, MD  traMADol (ULTRAM) 50 MG tablet Take 1 tablet (50 mg total) by mouth every 6 (six) hours as needed. Patient not taking: Reported on 05/01/2017 07/13/16   Hyman Bible, PA-C    Family History Family History  Problem Relation Age of Onset  . Heart disease Father   . Hypertension Father   . Alzheimer's disease Mother   . Diabetes Mother   . Diabetes Sister   . Dementia Other     Social History Social History  Substance Use Topics  . Smoking status: Never Smoker  . Smokeless tobacco: Never Used  . Alcohol use Yes     Comment:  rare     Allergies   Patient has no known allergies.   Review of Systems Review of Systems 10 Systems reviewed and are negative for acute change except as noted in the HPI.   Physical Exam Updated Vital Signs BP (!) 197/98   Pulse 77   Temp 98.1 F (36.7 C) (Oral)   Resp (!) 22   Ht 5\' 4"  (1.626 m)   Wt 108.9 kg (240 lb)   SpO2 96%   BMI 41.20 kg/m   Physical Exam  Constitutional: She is oriented to person, place, and time. She appears well-developed and well-nourished. No distress.  Patient is alert and nontoxic. No respiratory distress. Normal mental status. Obesity.  HENT:  Head: Normocephalic and atraumatic.  Mouth/Throat: Oropharynx is clear and moist.  Eyes: Pupils are equal, round, and reactive to light. Conjunctivae and EOM are normal.  Neck: Neck supple.  Cardiovascular: Normal rate and regular rhythm.   No murmur heard. Pulmonary/Chest: Effort normal and breath sounds normal. No respiratory distress.  Abdominal: Soft. She exhibits no distension. There is no tenderness.  Musculoskeletal: Normal range of motion. She exhibits tenderness. She exhibits no edema.  Patient is a tenderness to palpation in the left lower back over the SI joint region. Range of motion is intact. She is able to sit by supine and stand.  Neurological: She is alert and oriented to person, place, and time. No cranial nerve deficit. She  exhibits normal muscle tone. Coordination normal.  Skin: Skin is warm and dry.  Psychiatric: She has a normal mood and affect.  Nursing note and vitals reviewed.    ED Treatments / Results  Labs (all labs ordered are listed, but only abnormal results are displayed) Labs Reviewed  BASIC METABOLIC PANEL - Abnormal; Notable for the following:       Result Value   Sodium 133 (*)    CO2 21 (*)    Glucose, Bld 359 (*)    BUN 32 (*)    Creatinine, Ser 2.25 (*)    Calcium 8.4 (*)    GFR calc non Af Amer 23 (*)    GFR calc Af Amer 26 (*)    All other components within normal limits  CBC -  Abnormal; Notable for the following:    Hemoglobin 11.5 (*)    HCT 34.1 (*)    All other components within normal limits  URINALYSIS, ROUTINE W REFLEX MICROSCOPIC - Abnormal; Notable for the following:    Color, Urine STRAW (*)    Glucose, UA >=500 (*)    Protein, ur >=300 (*)    Bacteria, UA RARE (*)    Squamous Epithelial / LPF 0-5 (*)    All other components within normal limits  I-STAT TROPONIN, ED    EKG  EKG Interpretation None       Radiology Dg Chest 2 View  Result Date: 05/01/2017 CLINICAL DATA:  Chest pain today. EXAM: CHEST  2 VIEW COMPARISON:  March 19, 2017 FINDINGS: The mediastinal contour is normal. The heart size is enlarged. Both lungs are clear. The visualized skeletal structures are unremarkable. IMPRESSION: No active cardiopulmonary disease. Electronically Signed   By: Abelardo Diesel M.D.   On: 05/01/2017 19:01   Ct Renal Stone Study  Result Date: 05/02/2017 CLINICAL DATA:  Left flank pain and nausea, onset today. EXAM: CT ABDOMEN AND PELVIS WITHOUT CONTRAST TECHNIQUE: Multidetector CT imaging of the abdomen and pelvis was performed following the standard protocol without IV contrast. COMPARISON:  07/13/2016 FINDINGS: Lower chest: No acute abnormality. Hepatobiliary: No focal liver abnormality is seen. No gallstones, gallbladder wall thickening, or biliary dilatation.  Pancreas: Unremarkable. No pancreatic ductal dilatation or surrounding inflammatory changes. Spleen: Normal in size without focal abnormality. Adrenals/Urinary Tract: Adrenal glands are unremarkable. Kidneys are normal, without renal calculi, focal lesion, or hydronephrosis. Bladder is unremarkable. Stomach/Bowel: Stomach is within normal limits. Appendix is normal. No evidence of bowel wall thickening, distention, or inflammatory changes. Vascular/Lymphatic: The abdominal aorta is normal in caliber with mild atherosclerotic calcification. Bilateral renal artery origins are heavily calcified. Reproductive: Status post hysterectomy. No adnexal masses. Other: No focal inflammation. No ascites. Fat containing umbilical hernia. Musculoskeletal: No significant skeletal lesion. IMPRESSION: 1. No acute findings are evident in the abdomen or pelvis. 2. Aortic atherosclerosis. 3. Fat containing umbilical hernia. Electronically Signed   By: Andreas Newport M.D.   On: 05/02/2017 00:05    Procedures Procedures (including critical care time)  Medications Ordered in ED Medications  morphine 4 MG/ML injection 4 mg (4 mg Intravenous Given 05/02/17 0017)  0.9 %  sodium chloride infusion ( Intravenous New Bag/Given 05/02/17 0018)  amLODipine (NORVASC) tablet 10 mg (10 mg Oral Given 05/02/17 0016)     Initial Impression / Assessment and Plan / ED Course  I have reviewed the triage vital signs and the nursing notes.  Pertinent labs & imaging results that were available during my care of the patient were reviewed by me and considered in my medical decision making (see chart for details).      Final Clinical Impressions(s) / ED Diagnoses   Final diagnoses:  Sciatica of left side  Essential hypertension   Patient's presenting symptoms are most suggestive of sciatica. She is having left lower back pain is reproducible and exacerbated by position change. She does not have weakness or numbness. Patient is treated  for pain in the emergency department. She has multiple comorbid medical conditions. Patient does report her blood pressure is been difficult to control recently. She is working with her physician monitoring and keeping a journal. No signs of end organ damage at this time. Patient reports her blood pressures always more elevated when she is either stress or in pain. She reports she still has to take  her evening dose of medications at home. Patient is counseled on signs and symptoms for which to return. New Prescriptions New Prescriptions   HYDROCODONE-ACETAMINOPHEN (NORCO/VICODIN) 5-325 MG TABLET    Take 1-2 tablets by mouth every 4 (four) hours as needed for moderate pain or severe pain.     Charlesetta Shanks, MD 05/02/17 [phone]

## 2017-05-14 ENCOUNTER — Encounter (HOSPITAL_COMMUNITY): Payer: Self-pay | Admitting: Emergency Medicine

## 2017-05-14 ENCOUNTER — Inpatient Hospital Stay (HOSPITAL_COMMUNITY)
Admission: EM | Admit: 2017-05-14 | Discharge: 2017-05-17 | DRG: 637 | Disposition: A | Discharge status: Home / Self Care | Payer: Medicaid Other | Attending: Internal Medicine | Admitting: Internal Medicine

## 2017-05-14 ENCOUNTER — Emergency Department (HOSPITAL_COMMUNITY): Payer: Medicaid Other

## 2017-05-14 DIAGNOSIS — E1165 Type 2 diabetes mellitus with hyperglycemia: Secondary | ICD-10-CM | POA: Diagnosis not present

## 2017-05-14 DIAGNOSIS — R739 Hyperglycemia, unspecified: Secondary | ICD-10-CM

## 2017-05-14 DIAGNOSIS — N189 Chronic kidney disease, unspecified: Secondary | ICD-10-CM

## 2017-05-14 DIAGNOSIS — Z9119 Patient's noncompliance with other medical treatment and regimen: Secondary | ICD-10-CM

## 2017-05-14 DIAGNOSIS — E1122 Type 2 diabetes mellitus with diabetic chronic kidney disease: Secondary | ICD-10-CM | POA: Diagnosis present

## 2017-05-14 DIAGNOSIS — G9341 Metabolic encephalopathy: Secondary | ICD-10-CM | POA: Diagnosis present

## 2017-05-14 DIAGNOSIS — G8929 Other chronic pain: Secondary | ICD-10-CM | POA: Diagnosis present

## 2017-05-14 DIAGNOSIS — N179 Acute kidney failure, unspecified: Secondary | ICD-10-CM

## 2017-05-14 DIAGNOSIS — I517 Cardiomegaly: Secondary | ICD-10-CM | POA: Diagnosis present

## 2017-05-14 DIAGNOSIS — Z79899 Other long term (current) drug therapy: Secondary | ICD-10-CM

## 2017-05-14 DIAGNOSIS — E1100 Type 2 diabetes mellitus with hyperosmolarity without nonketotic hyperglycemic-hyperosmolar coma (NKHHC): Principal | ICD-10-CM | POA: Diagnosis present

## 2017-05-14 DIAGNOSIS — E861 Hypovolemia: Secondary | ICD-10-CM | POA: Diagnosis present

## 2017-05-14 DIAGNOSIS — N183 Chronic kidney disease, stage 3 unspecified: Secondary | ICD-10-CM | POA: Diagnosis present

## 2017-05-14 DIAGNOSIS — I10 Essential (primary) hypertension: Secondary | ICD-10-CM | POA: Diagnosis present

## 2017-05-14 DIAGNOSIS — E1142 Type 2 diabetes mellitus with diabetic polyneuropathy: Secondary | ICD-10-CM | POA: Diagnosis present

## 2017-05-14 DIAGNOSIS — E119 Type 2 diabetes mellitus without complications: Secondary | ICD-10-CM

## 2017-05-14 DIAGNOSIS — Z90710 Acquired absence of both cervix and uterus: Secondary | ICD-10-CM

## 2017-05-14 DIAGNOSIS — E785 Hyperlipidemia, unspecified: Secondary | ICD-10-CM | POA: Diagnosis present

## 2017-05-14 DIAGNOSIS — Z833 Family history of diabetes mellitus: Secondary | ICD-10-CM

## 2017-05-14 DIAGNOSIS — Z794 Long term (current) use of insulin: Secondary | ICD-10-CM

## 2017-05-14 DIAGNOSIS — Z820 Family history of epilepsy and other diseases of the nervous system: Secondary | ICD-10-CM

## 2017-05-14 DIAGNOSIS — M549 Dorsalgia, unspecified: Secondary | ICD-10-CM | POA: Diagnosis present

## 2017-05-14 DIAGNOSIS — Z8249 Family history of ischemic heart disease and other diseases of the circulatory system: Secondary | ICD-10-CM

## 2017-05-14 DIAGNOSIS — E871 Hypo-osmolality and hyponatremia: Secondary | ICD-10-CM | POA: Diagnosis present

## 2017-05-14 DIAGNOSIS — R7989 Other specified abnormal findings of blood chemistry: Secondary | ICD-10-CM | POA: Diagnosis present

## 2017-05-14 DIAGNOSIS — G9340 Encephalopathy, unspecified: Secondary | ICD-10-CM | POA: Diagnosis present

## 2017-05-14 DIAGNOSIS — R4182 Altered mental status, unspecified: Secondary | ICD-10-CM | POA: Diagnosis not present

## 2017-05-14 LAB — CBG MONITORING, ED: Glucose-Capillary: 520 mg/dL (ref 65–99)

## 2017-05-14 LAB — COMPREHENSIVE METABOLIC PANEL
ALT: 31 U/L (ref 14–54)
AST: 22 U/L (ref 15–41)
Albumin: 3.2 g/dL — ABNORMAL LOW (ref 3.5–5.0)
Alkaline Phosphatase: 93 U/L (ref 38–126)
Anion gap: 12 (ref 5–15)
BILIRUBIN TOTAL: 0.7 mg/dL (ref 0.3–1.2)
BUN: 51 mg/dL — AB (ref 6–20)
CHLORIDE: 95 mmol/L — AB (ref 101–111)
CO2: 21 mmol/L — ABNORMAL LOW (ref 22–32)
CREATININE: 2.21 mg/dL — AB (ref 0.44–1.00)
Calcium: 8.6 mg/dL — ABNORMAL LOW (ref 8.9–10.3)
GFR, EST AFRICAN AMERICAN: 27 mL/min — AB (ref >60)
GFR, EST NON AFRICAN AMERICAN: 23 mL/min — AB (ref >60)
Glucose, Bld: 521 mg/dL (ref 65–99)
Potassium: 4.4 mmol/L (ref 3.5–5.1)
Sodium: 128 mmol/L — ABNORMAL LOW (ref 135–145)
TOTAL PROTEIN: 6.9 g/dL (ref 6.5–8.1)

## 2017-05-14 LAB — I-STAT CG4 LACTIC ACID, ED: Lactic Acid, Venous: 2.06 mmol/L (ref 0.5–1.9)

## 2017-05-14 LAB — I-STAT TROPONIN, ED: TROPONIN I, POC: 0.08 ng/mL (ref 0.00–0.08)

## 2017-05-14 LAB — CBC WITH DIFFERENTIAL/PLATELET
BASOS PCT: 0 %
Basophils Absolute: 0 10*3/uL (ref 0.0–0.1)
EOS PCT: 0 %
Eosinophils Absolute: 0 10*3/uL (ref 0.0–0.7)
HEMATOCRIT: 32.6 % — AB (ref 36.0–46.0)
Hemoglobin: 11.9 g/dL — ABNORMAL LOW (ref 12.0–15.0)
Lymphocytes Relative: 23 %
Lymphs Abs: 2.5 10*3/uL (ref 0.7–4.0)
MCH: 29.2 pg (ref 26.0–34.0)
MCHC: 36.5 g/dL — AB (ref 30.0–36.0)
MCV: 80.1 fL (ref 78.0–100.0)
Monocytes Absolute: 0.8 10*3/uL (ref 0.1–1.0)
Monocytes Relative: 7 %
NEUTROS ABS: 7.4 10*3/uL (ref 1.7–7.7)
Neutrophils Relative %: 70 %
PLATELETS: 347 10*3/uL (ref 150–400)
RBC: 4.07 MIL/uL (ref 3.87–5.11)
RDW: 12.4 % (ref 11.5–15.5)
WBC: 10.7 10*3/uL — ABNORMAL HIGH (ref 4.0–10.5)

## 2017-05-14 LAB — PROTIME-INR
INR: 1
Prothrombin Time: 13.2 seconds (ref 11.4–15.2)

## 2017-05-14 LAB — APTT: APTT: 27 s (ref 24–36)

## 2017-05-14 LAB — ETHANOL: Alcohol, Ethyl (B): <5 mg/dL (ref <5)

## 2017-05-14 MED ORDER — SODIUM CHLORIDE 0.9 % IV SOLN: 999.0000 mL/h | INTRAVENOUS | Status: AC

## 2017-05-14 MED ORDER — SODIUM CHLORIDE 0.9 % IV SOLN
INTRAVENOUS | Status: DC
  Administered 2017-05-15: 3.9 [IU]/h via INTRAVENOUS
  Filled 2017-05-14: qty 1

## 2017-05-14 MED ORDER — SODIUM CHLORIDE 0.9 % IV BOLUS (SEPSIS)
1000.0000 mL | Freq: Once | INTRAVENOUS | Status: AC
  Administered 2017-05-14: 1000 mL via INTRAVENOUS

## 2017-05-14 NOTE — ED Provider Notes (Signed)
Latasha [name] DEPT Provider Note   CSN: [id number] Arrival date & time: 05/14/17  2143     History   Chief Complaint Chief Complaint  Patient presents with  . Altered Mental Status    HPI Latasha [name] is a 61 y.o. female.  HPI Patient presents to the emergency room for evaluation of mental status. Patient has been having issues with persistent left leg pain. She has been diagnosed with peripheral neuropathy. Patient has been on pain medications as well as Neurontin. The daughter brought her in for evaluation today because she's not been acting normally today. She has been confused and saying things that does not make sense. She also has had very poor short-term memory. She does not remember what she has been saying during conversations. She does have a headache. She denies any fevers. She denies any neck pain.  Past Medical History:  Diagnosis Date  . BPPV (benign paroxysmal positional vertigo)   . Diabetes mellitus   . Hyperlipidemia   . Hypertension   . Palpitations     Patient Active Problem List   Diagnosis Date Noted  . Hyperlipemia 01/19/2014  . Palpitations 01/19/2014  . PVC's (premature ventricular contractions) 01/19/2014  . SCIATICA, RIGHT 12/12/2010  . GASTROESOPHAGEAL REFLUX DISEASE 02/09/2010  . LEG CRAMPS 02/09/2010  . OBESITY 12/01/2009  . VITAMIN D DEFICIENCY 10/13/2009  . CONSTIPATION 10/13/2009  . LIVER FUNCTION TESTS, ABNORMAL, HX OF 10/13/2009  . Essential hypertension 08/24/2009  . SHOULDER PAIN, RIGHT, CHRONIC 08/24/2009  . Diabetes ([place]) 10/08/1991    Past Surgical History:  Procedure Laterality Date  . ABDOMINAL HYSTERECTOMY    . EYE SURGERY     cataract/left eye, right eye had bleeding in back  . KNEE SURGERY    . TRIGGER FINGER RELEASE      OB History    No data available       Home Medications    Prior to Admission medications   Medication Sig Start Date End Date Taking? Authorizing Provider  Dulaglutide (TRULICITY) 0.81  KG/8.1EH SOPN Inject 0.75 mg into the skin once a week. 04/18/17   Renato Shin, MD  gabapentin (NEURONTIN) 100 MG capsule Take 100 mg by mouth 3 (three) times daily.    [provider]  HYDROcodone-acetaminophen (NORCO/VICODIN) 5-325 MG tablet Take 1-2 tablets by mouth every 4 (four) hours as needed for moderate pain or severe pain. 05/02/17   Charlesetta Shanks, MD  Insulin Glargine (TOUJEO SOLOSTAR) 300 UNIT/ML SOPN Inject 80 Units into the skin every morning. And pen needles 1/day 04/18/17   Renato Shin, MD  metFORMIN (GLUCOPHAGE) 1000 MG tablet Take 1 tablet by mouth 2 (two) times daily. 04/26/16   [provider]  methocarbamol (ROBAXIN) 500 MG tablet Take 1 tablet (500 mg total) by mouth 2 (two) times daily. Patient not taking: Reported on 05/01/2017 07/13/16   Hyman Bible, PA-C  metoprolol succinate (TOPROL-XL) 25 MG 24 hr tablet Take 1 tablet (25 mg total) by mouth daily. Patient not taking: Reported on 05/01/2017 05/26/16   Julianne Rice, MD  metoprolol tartrate (LOPRESSOR) 25 MG tablet Take 25 mg by mouth 2 (two) times daily.    [provider]  olmesartan (BENICAR) 40 MG tablet Take 40 mg by mouth daily.    [provider]  ondansetron (ZOFRAN) 4 MG tablet Take 1 tablet (4 mg total) by mouth every 6 (six) hours. Patient not taking: Reported on 05/01/2017 07/13/16   Hyman Bible, PA-C  potassium chloride SA (K-DUR,KLOR-CON) 20 MEQ  tablet Take 1 tablet (20 mEq total) by mouth daily. Patient not taking: Reported on 05/01/2017 03/19/17   Forde Dandy, MD  rosuvastatin (CRESTOR) 10 MG tablet Take 10 mg by mouth daily.    [provider]  traMADol (ULTRAM) 50 MG tablet Take 1 tablet (50 mg total) by mouth every 6 (six) hours as needed. Patient not taking: Reported on 05/01/2017 07/13/16   Hyman Bible, PA-C    Family History Family History  Problem Relation Age of Onset  . Heart disease Father   . Hypertension Father   . Alzheimer's  disease Mother   . Diabetes Mother   . Diabetes Sister   . Dementia Other     Social History Social History  Substance Use Topics  . Smoking status: Never Smoker  . Smokeless tobacco: Never Used  . Alcohol use Yes     Comment:  rare     Allergies   Patient has no known allergies.   Review of Systems Review of Systems  [name] other systems reviewed and are negative.    Physical Exam Updated Vital Signs BP (!) 144/120 (BP Location: Right Arm)   Pulse 91   Temp 100 F (37.8 C) (Oral)   Resp 16   SpO2 94%   Physical Exam  Constitutional: She appears well-developed and well-nourished. No distress.  HENT:  Head: Normocephalic and atraumatic.  Right Ear: External ear normal.  Left Ear: External ear normal.  Eyes: Conjunctivae are normal. Right eye exhibits no discharge. Left eye exhibits no discharge. No scleral icterus.  Neck: Neck supple. No tracheal deviation and normal range of motion present.  Cardiovascular: Normal rate, regular rhythm and intact distal pulses.   Pulmonary/Chest: Effort normal and breath sounds normal. No stridor. No respiratory distress. She has no wheezes. She has no rales.  Abdominal: Soft. Bowel sounds are normal. She exhibits no distension. There is no tenderness. There is no rebound and no guarding.  Musculoskeletal: She exhibits no edema or tenderness.  Neurological: She is alert. No cranial nerve deficit (no facial droop, extraocular movements intact, no slurred speech) or sensory deficit. She exhibits normal muscle tone. She displays no seizure activity. Coordination normal. GCS eye subscore is 4. GCS verbal subscore is 4. GCS motor subscore is 6.  Confused about the date, patient has equal grip strength bilaterally, good plantar flexion bilaterally,  Skin: Skin is warm and dry. No rash noted.  Psychiatric: She has a normal mood and affect.  Nursing note and vitals reviewed.    ED Treatments / Results  Labs ([name] labs ordered are listed,  but only abnormal results are displayed) Labs Reviewed  CBC WITH DIFFERENTIAL/PLATELET - Abnormal; Notable for the following:       Result Value   WBC 10.7 (*)    Hemoglobin 11.9 (*)    HCT 32.6 (*)    MCHC 36.5 (*)    [name] other components within normal limits  COMPREHENSIVE METABOLIC PANEL - Abnormal; Notable for the following:    Sodium 128 (*)    Chloride 95 (*)    CO2 21 (*)    Glucose, Bld 521 (*)    BUN 51 (*)    Creatinine, Ser 2.21 (*)    Calcium 8.6 (*)    Albumin 3.2 (*)    GFR calc non Af Amer 23 (*)    GFR calc Af Amer 27 (*)    [name] other components within normal limits  I-STAT CG4 LACTIC ACID, ED - Abnormal; Notable  for the following:    Lactic Acid, Venous 2.06 (*)    [name] other components within normal limits  CBG MONITORING, ED - Abnormal; Notable for the following:    Glucose-Capillary 520 (*)    [name] other components within normal limits  CULTURE, BLOOD (SINGLE)  ETHANOL  PROTIME-INR  APTT  RAPID URINE DRUG SCREEN, HOSP PERFORMED  URINALYSIS, ROUTINE W REFLEX MICROSCOPIC  I-STAT TROPONIN, ED    EKG  EKG Interpretation  Date/Time:  Wednesday May 14 2017 22:51:52 EDT Ventricular Rate:  99 PR Interval:    QRS Duration: 89 QT Interval:  385 QTC Calculation: 495 R Axis:   11 Text Interpretation:  Sinus rhythm RSR' in V1 or V2, probably normal variant Repol abnrm, severe global ischemia (LM/MVD) Baseline wander in lead(s) V3 similar to ECG 26 [id number] Confirmed by Dorie Rank [phone]) on 05/14/2017 11:10:35 PM       Radiology Dg Chest 2 View  Result Date: 05/14/2017 CLINICAL DATA:  Altered mental status, confusion. History of diabetes and hypertension. EXAM: CHEST  2 VIEW COMPARISON:  Chest radiograph May 01, 2017 FINDINGS: Cardiac silhouette is mildly enlarged and unchanged. Mediastinal silhouette is nonsuspicious. No pleural effusion or focal consolidation. No pneumothorax. Large body habitus. Osseous structures are unchanged. IMPRESSION: Stable  cardiomegaly, no acute pulmonary process. Electronically Signed   By: Elon Alas M.D.   On: 05/14/2017 23:36   Ct Head Wo Contrast  Result Date: 05/14/2017 CLINICAL DATA:  Altered mental status today. History of hypertension, hyperlipidemia and diabetes. EXAM: CT HEAD WITHOUT CONTRAST TECHNIQUE: Contiguous axial images were obtained from the base of the skull through the vertex without intravenous contrast. COMPARISON:  MRI of the head August 09, 2016 FINDINGS: BRAIN: No intraparenchymal hemorrhage, mass effect nor midline shift. The ventricles and sulci are normal for age. Patchy supratentorial white matter hypodensities. No acute large vascular territory infarcts. No abnormal extra-axial fluid collections. Basal cisterns are patent. VASCULAR: Mild calcific atherosclerosis of the carotid siphons. SKULL: No skull fracture. No significant scalp soft tissue swelling. SINUSES/ORBITS: The mastoid air-cells and included paranasal sinuses are well-aerated.The included ocular globes and orbital contents are non-suspicious. Status post bilateral ocular lens implants. OTHER: None. IMPRESSION: 1. No acute intracranial process. 2. Moderate matter changes compatible with chronic small vessel ischemic disease, advanced for age and, from prior MRI given differences in imaging technique. Electronically Signed   By: Elon Alas M.D.   On: 05/14/2017 23:39    Procedures Procedures (including critical care time)  Medications Ordered in ED Medications  sodium chloride 0.9 % bolus 1,000 mL (1,000 mLs Intravenous New Bag/Given 05/14/17 2342)  0.9 %  sodium chloride infusion (not administered)  insulin regular (NOVOLIN R,HUMULIN R) 100 Units in sodium chloride 0.9 % 100 mL (1 Units/mL) infusion (not administered)     Initial Impression / Assessment and Plan / ED Course  I have reviewed the triage vital signs and the nursing notes.  Pertinent labs & imaging results that were available during my care of the  patient were reviewed by me and considered in my medical decision making (see chart for details).   patient presents to the emergency room for evaluation of confusion.  The patient's laboratory tests are notable for hyperglycemia associated with hyponatremia.  Patient's creatinine is also elevated at 2.21. This is similar to 2 weeks ago but up from 1.6, 1 month ago. Possible her acute kidney injury is causing some issues with her medications contributing to her confusion.  Head CT shows no  acute findings.   Doubt meningitis.  Cannot exclude acute stroke but overall not typical for stroke.   UA pending.  Likely will require admission.  Final Clinical Impressions(s) / ED Diagnoses   Final diagnoses:  Hyperglycemia  Acute kidney injury [facility])     Dorie Rank, MD 05/15/17 [phone]

## 2017-05-14 NOTE — ED Triage Notes (Signed)
Pt family reports pt having altered mental status starting today. Pt stated she was going to the bathroom, but went into kitchen to urinate. Pt A&O x 3 at times.

## 2017-05-15 ENCOUNTER — Inpatient Hospital Stay (HOSPITAL_COMMUNITY): Payer: Medicaid Other

## 2017-05-15 ENCOUNTER — Encounter (HOSPITAL_COMMUNITY): Payer: Self-pay | Admitting: Family Medicine

## 2017-05-15 DIAGNOSIS — Z90710 Acquired absence of both cervix and uterus: Secondary | ICD-10-CM | POA: Diagnosis not present

## 2017-05-15 DIAGNOSIS — R079 Chest pain, unspecified: Secondary | ICD-10-CM | POA: Diagnosis not present

## 2017-05-15 DIAGNOSIS — N183 Chronic kidney disease, stage 3 unspecified: Secondary | ICD-10-CM | POA: Diagnosis present

## 2017-05-15 DIAGNOSIS — E119 Type 2 diabetes mellitus without complications: Secondary | ICD-10-CM | POA: Diagnosis not present

## 2017-05-15 DIAGNOSIS — E1100 Type 2 diabetes mellitus with hyperosmolarity without nonketotic hyperglycemic-hyperosmolar coma (NKHHC): Secondary | ICD-10-CM | POA: Diagnosis present

## 2017-05-15 DIAGNOSIS — N179 Acute kidney failure, unspecified: Secondary | ICD-10-CM

## 2017-05-15 DIAGNOSIS — E861 Hypovolemia: Secondary | ICD-10-CM | POA: Diagnosis present

## 2017-05-15 DIAGNOSIS — G8929 Other chronic pain: Secondary | ICD-10-CM | POA: Diagnosis not present

## 2017-05-15 DIAGNOSIS — E1101 Type 2 diabetes mellitus with hyperosmolarity with coma: Secondary | ICD-10-CM | POA: Diagnosis not present

## 2017-05-15 DIAGNOSIS — G9340 Encephalopathy, unspecified: Secondary | ICD-10-CM | POA: Diagnosis not present

## 2017-05-15 DIAGNOSIS — R7989 Other specified abnormal findings of blood chemistry: Secondary | ICD-10-CM | POA: Diagnosis not present

## 2017-05-15 DIAGNOSIS — E871 Hypo-osmolality and hyponatremia: Secondary | ICD-10-CM | POA: Diagnosis not present

## 2017-05-15 DIAGNOSIS — I10 Essential (primary) hypertension: Secondary | ICD-10-CM | POA: Diagnosis not present

## 2017-05-15 DIAGNOSIS — E1165 Type 2 diabetes mellitus with hyperglycemia: Secondary | ICD-10-CM | POA: Diagnosis not present

## 2017-05-15 DIAGNOSIS — R739 Hyperglycemia, unspecified: Secondary | ICD-10-CM

## 2017-05-15 DIAGNOSIS — I517 Cardiomegaly: Secondary | ICD-10-CM | POA: Diagnosis not present

## 2017-05-15 DIAGNOSIS — Z8249 Family history of ischemic heart disease and other diseases of the circulatory system: Secondary | ICD-10-CM | POA: Diagnosis not present

## 2017-05-15 DIAGNOSIS — E1122 Type 2 diabetes mellitus with diabetic chronic kidney disease: Secondary | ICD-10-CM | POA: Diagnosis present

## 2017-05-15 DIAGNOSIS — G9341 Metabolic encephalopathy: Secondary | ICD-10-CM | POA: Diagnosis not present

## 2017-05-15 DIAGNOSIS — E1142 Type 2 diabetes mellitus with diabetic polyneuropathy: Secondary | ICD-10-CM | POA: Diagnosis present

## 2017-05-15 DIAGNOSIS — Z820 Family history of epilepsy and other diseases of the nervous system: Secondary | ICD-10-CM | POA: Diagnosis not present

## 2017-05-15 DIAGNOSIS — E785 Hyperlipidemia, unspecified: Secondary | ICD-10-CM | POA: Diagnosis present

## 2017-05-15 DIAGNOSIS — Z79899 Other long term (current) drug therapy: Secondary | ICD-10-CM | POA: Diagnosis not present

## 2017-05-15 DIAGNOSIS — N189 Chronic kidney disease, unspecified: Secondary | ICD-10-CM

## 2017-05-15 DIAGNOSIS — Z794 Long term (current) use of insulin: Secondary | ICD-10-CM

## 2017-05-15 DIAGNOSIS — M549 Dorsalgia, unspecified: Secondary | ICD-10-CM | POA: Diagnosis not present

## 2017-05-15 DIAGNOSIS — Z9119 Patient's noncompliance with other medical treatment and regimen: Secondary | ICD-10-CM | POA: Diagnosis not present

## 2017-05-15 DIAGNOSIS — R4182 Altered mental status, unspecified: Secondary | ICD-10-CM | POA: Diagnosis not present

## 2017-05-15 DIAGNOSIS — Z833 Family history of diabetes mellitus: Secondary | ICD-10-CM | POA: Diagnosis not present

## 2017-05-15 DIAGNOSIS — N184 Chronic kidney disease, stage 4 (severe): Secondary | ICD-10-CM

## 2017-05-15 HISTORY — DX: Chronic kidney disease, stage 4 (severe): N18.4

## 2017-05-15 LAB — BLOOD GAS, VENOUS
Acid-base deficit: 2.2 mmol/L — ABNORMAL HIGH (ref 0.0–2.0)
Bicarbonate: 21.5 mmol/L (ref 20.0–28.0)
O2 Saturation: 91.4 %
PATIENT TEMPERATURE: 98.6
PH VEN: 7.406 (ref 7.250–7.430)
pCO2, Ven: 34.9 mmHg — ABNORMAL LOW (ref 44.0–60.0)
pO2, Ven: 65 mmHg — ABNORMAL HIGH (ref 32.0–45.0)

## 2017-05-15 LAB — URINALYSIS, ROUTINE W REFLEX MICROSCOPIC
BACTERIA UA: NONE SEEN
BILIRUBIN URINE: NEGATIVE
Glucose, UA: >=500 mg/dL — AB
Ketones, ur: NEGATIVE mg/dL
Leukocytes, UA: NEGATIVE
Nitrite: NEGATIVE
SPECIFIC GRAVITY, URINE: 1.017 (ref 1.005–1.030)
pH: 5 (ref 5.0–8.0)

## 2017-05-15 LAB — BASIC METABOLIC PANEL
ANION GAP: 10 (ref 5–15)
Anion gap: 8 (ref 5–15)
Anion gap: 7 (ref 5–15)
BUN: 46 mg/dL — ABNORMAL HIGH (ref 6–20)
BUN: 47 mg/dL — AB (ref 6–20)
BUN: 47 mg/dL — AB (ref 6–20)
CALCIUM: 8 mg/dL — AB (ref 8.9–10.3)
CHLORIDE: 102 mmol/L (ref 101–111)
CO2: 21 mmol/L — AB (ref 22–32)
CO2: 23 mmol/L (ref 22–32)
CO2: 23 mmol/L (ref 22–32)
CREATININE: 2.12 mg/dL — AB (ref 0.44–1.00)
CREATININE: 2.31 mg/dL — AB (ref 0.44–1.00)
Calcium: 8.1 mg/dL — ABNORMAL LOW (ref 8.9–10.3)
Calcium: 7.9 mg/dL — ABNORMAL LOW (ref 8.9–10.3)
Chloride: 103 mmol/L (ref 101–111)
Chloride: 103 mmol/L (ref 101–111)
Creatinine, Ser: 2.06 mg/dL — ABNORMAL HIGH (ref 0.44–1.00)
GFR calc Af Amer: 28 mL/min — ABNORMAL LOW (ref >60)
GFR calc Af Amer: 25 mL/min — ABNORMAL LOW (ref >60)
GFR calc non Af Amer: 25 mL/min — ABNORMAL LOW (ref >60)
GFR calc non Af Amer: 22 mL/min — ABNORMAL LOW (ref >60)
GFR, EST AFRICAN AMERICAN: 29 mL/min — AB (ref >60)
GFR, EST NON AFRICAN AMERICAN: 24 mL/min — AB (ref >60)
Glucose, Bld: 402 mg/dL — ABNORMAL HIGH (ref 65–99)
Glucose, Bld: 181 mg/dL — ABNORMAL HIGH (ref 65–99)
Glucose, Bld: 280 mg/dL — ABNORMAL HIGH (ref 65–99)
POTASSIUM: 3.5 mmol/L (ref 3.5–5.1)
POTASSIUM: 4 mmol/L (ref 3.5–5.1)
Potassium: 3.5 mmol/L (ref 3.5–5.1)
SODIUM: 134 mmol/L — AB (ref 135–145)
SODIUM: 133 mmol/L — AB (ref 135–145)
Sodium: 133 mmol/L — ABNORMAL LOW (ref 135–145)

## 2017-05-15 LAB — GLUCOSE, CAPILLARY
GLUCOSE-CAPILLARY: 369 mg/dL — AB (ref 65–99)
GLUCOSE-CAPILLARY: 284 mg/dL — AB (ref 65–99)
GLUCOSE-CAPILLARY: 173 mg/dL — AB (ref 65–99)
GLUCOSE-CAPILLARY: 232 mg/dL — AB (ref 65–99)
Glucose-Capillary: 376 mg/dL — ABNORMAL HIGH (ref 65–99)
Glucose-Capillary: 211 mg/dL — ABNORMAL HIGH (ref 65–99)
Glucose-Capillary: 185 mg/dL — ABNORMAL HIGH (ref 65–99)
Glucose-Capillary: 163 mg/dL — ABNORMAL HIGH (ref 65–99)
Glucose-Capillary: 174 mg/dL — ABNORMAL HIGH (ref 65–99)
Glucose-Capillary: 165 mg/dL — ABNORMAL HIGH (ref 65–99)
Glucose-Capillary: 282 mg/dL — ABNORMAL HIGH (ref 65–99)

## 2017-05-15 LAB — CBG MONITORING, ED: Glucose-Capillary: 451 mg/dL — ABNORMAL HIGH (ref 65–99)

## 2017-05-15 LAB — ECHOCARDIOGRAM COMPLETE
HEIGHTINCHES: 64 in
WEIGHTICAEL: 3703.73 [oz_av]

## 2017-05-15 LAB — AMMONIA: Ammonia: 13 umol/L (ref 9–35)

## 2017-05-15 LAB — RAPID URINE DRUG SCREEN, HOSP PERFORMED
Amphetamines: NOT DETECTED
BARBITURATES: NOT DETECTED
BENZODIAZEPINES: NOT DETECTED
COCAINE: NOT DETECTED
OPIATES: NOT DETECTED
TETRAHYDROCANNABINOL: NOT DETECTED

## 2017-05-15 LAB — TROPONIN I
TROPONIN I: 0.05 ng/mL — AB (ref <0.03)
TROPONIN I: 0.04 ng/mL — AB (ref <0.03)
Troponin I: 0.07 ng/mL (ref <0.03)

## 2017-05-15 LAB — RPR: RPR Ser Ql: NONREACTIVE

## 2017-05-15 LAB — LACTIC ACID, PLASMA: Lactic Acid, Venous: 1.8 mmol/L (ref 0.5–1.9)

## 2017-05-15 LAB — MRSA PCR SCREENING: MRSA by PCR: NEGATIVE

## 2017-05-15 LAB — HIV ANTIBODY (ROUTINE TESTING W REFLEX): HIV Screen 4th Generation wRfx: NONREACTIVE

## 2017-05-15 LAB — TSH: TSH: 2.009 u[IU]/mL (ref 0.350–4.500)

## 2017-05-15 LAB — VITAMIN B12: Vitamin B-12: 834 pg/mL (ref 180–914)

## 2017-05-15 MED ORDER — INSULIN ASPART 100 UNIT/ML ~~LOC~~ SOLN
4.0000 [IU] | Freq: Three times a day (TID) | SUBCUTANEOUS | Status: DC
  Administered 2017-05-15 – 2017-05-16 (×3): 4 [IU] via SUBCUTANEOUS

## 2017-05-15 MED ORDER — ACETAMINOPHEN 325 MG PO TABS
650.0000 mg | ORAL_TABLET | Freq: Four times a day (QID) | ORAL | Status: DC | PRN
  Administered 2017-05-15 (×2): 650 mg via ORAL
  Filled 2017-05-15 (×2): qty 2

## 2017-05-15 MED ORDER — INSULIN ASPART 100 UNIT/ML ~~LOC~~ SOLN
0.0000 [IU] | Freq: Three times a day (TID) | SUBCUTANEOUS | Status: DC
  Administered 2017-05-15 – 2017-05-17 (×5): 11 [IU] via SUBCUTANEOUS
  Administered 2017-05-17: 7 [IU] via SUBCUTANEOUS

## 2017-05-15 MED ORDER — SODIUM CHLORIDE 0.9 % IV SOLN
INTRAVENOUS | Status: DC
  Administered 2017-05-15 (×2): via INTRAVENOUS

## 2017-05-15 MED ORDER — GABAPENTIN 100 MG PO CAPS
100.0000 mg | ORAL_CAPSULE | Freq: Three times a day (TID) | ORAL | Status: DC
  Administered 2017-05-15 – 2017-05-17 (×7): 100 mg via ORAL
  Filled 2017-05-15 (×7): qty 1

## 2017-05-15 MED ORDER — ROSUVASTATIN CALCIUM 10 MG PO TABS
10.0000 mg | ORAL_TABLET | Freq: Every day | ORAL | Status: DC
  Administered 2017-05-15 – 2017-05-16 (×2): 10 mg via ORAL
  Filled 2017-05-15 (×2): qty 1

## 2017-05-15 MED ORDER — HYDRALAZINE HCL 20 MG/ML IJ SOLN
10.0000 mg | INTRAMUSCULAR | Status: DC | PRN
  Administered 2017-05-15 – 2017-05-16 (×2): 10 mg via INTRAVENOUS
  Filled 2017-05-15: qty 1
  Filled 2017-05-15: qty 0.5
  Filled 2017-05-15: qty 1

## 2017-05-15 MED ORDER — HEPARIN SODIUM (PORCINE) 5000 UNIT/ML IJ SOLN
5000.0000 [IU] | Freq: Three times a day (TID) | INTRAMUSCULAR | Status: DC
  Administered 2017-05-15 – 2017-05-17 (×7): 5000 [IU] via SUBCUTANEOUS
  Filled 2017-05-15 (×7): qty 1

## 2017-05-15 MED ORDER — SODIUM CHLORIDE 0.9 % IV SOLN
INTRAVENOUS | Status: DC
  Administered 2017-05-15 – 2017-05-17 (×3): via INTRAVENOUS

## 2017-05-15 MED ORDER — METOPROLOL TARTRATE 25 MG PO TABS
25.0000 mg | ORAL_TABLET | Freq: Two times a day (BID) | ORAL | Status: DC
  Administered 2017-05-15 – 2017-05-17 (×6): 25 mg via ORAL
  Filled 2017-05-15 (×6): qty 1

## 2017-05-15 MED ORDER — INSULIN ASPART 100 UNIT/ML ~~LOC~~ SOLN
0.0000 [IU] | Freq: Every day | SUBCUTANEOUS | Status: DC
  Administered 2017-05-15: 2 [IU] via SUBCUTANEOUS
  Administered 2017-05-16: 3 [IU] via SUBCUTANEOUS

## 2017-05-15 MED ORDER — HYDROCODONE-ACETAMINOPHEN 5-325 MG PO TABS: 1.0000 | ORAL_TABLET | ORAL | Status: DC | PRN

## 2017-05-15 MED ORDER — AMLODIPINE BESYLATE 5 MG PO TABS
5.0000 mg | ORAL_TABLET | Freq: Every day | ORAL | Status: DC
  Administered 2017-05-15 – 2017-05-17 (×3): 5 mg via ORAL
  Filled 2017-05-15 (×3): qty 1

## 2017-05-15 MED ORDER — ONDANSETRON HCL 4 MG/2ML IJ SOLN: 4.0000 mg | Freq: Four times a day (QID) | INTRAMUSCULAR | Status: DC | PRN

## 2017-05-15 MED ORDER — METHOCARBAMOL 500 MG PO TABS
500.0000 mg | ORAL_TABLET | Freq: Two times a day (BID) | ORAL | Status: DC
  Administered 2017-05-15 – 2017-05-17 (×5): 500 mg via ORAL
  Filled 2017-05-15 (×5): qty 1

## 2017-05-15 MED ORDER — POTASSIUM CHLORIDE 10 MEQ/100ML IV SOLN: 10.0000 meq | INTRAVENOUS | Status: AC

## 2017-05-15 MED ORDER — DEXTROSE-NACL 5-0.45 % IV SOLN
INTRAVENOUS | Status: DC
  Administered 2017-05-15: 06:00:00 via INTRAVENOUS

## 2017-05-15 MED ORDER — INSULIN GLARGINE 100 UNIT/ML ~~LOC~~ SOLN
30.0000 [IU] | Freq: Two times a day (BID) | SUBCUTANEOUS | Status: DC
  Administered 2017-05-15 (×2): 30 [IU] via SUBCUTANEOUS
  Filled 2017-05-15 (×3): qty 0.3

## 2017-05-15 MED ORDER — SODIUM CHLORIDE 0.9 % IV SOLN: INTRAVENOUS | Status: DC

## 2017-05-15 NOTE — Progress Notes (Signed)
PROGRESS NOTE    Latasha [name]  FGH:[id number] DOB: Jun 10, 1956 DOA: 05/14/2017 PCP: Shirline Frees, MD    Brief Narrative:  Latasha [name] is a 61 year old female history of insulin-dependent diabetes mellitus, hypertension, chronic pain presented to the ED with altered mental status. Patient noted to be hyperglycemic with blood glucose levels over 600 as well as in acute on chronic kidney disease with a creatinine as high as 2.21. Patient admitted to the step down unit placed on the glucose stabilizer.   Assessment & Plan:   Principal Problem:   Hyperosmolar non-ketotic state in patient with type 2 diabetes mellitus ([place]) Active Problems:   Insulin-requiring or dependent type II diabetes mellitus ([place])   Essential hypertension   Encephalopathy acute   CKD (chronic kidney disease), stage III   Chronic pain  #1 hyperosmolar nonketotic state in patient with type 2 diabetes mellitus/poorly controlled diabetes mellitus Likely secondary to medical noncompliance as patient stated she had not taken her diabetic medications 3 days prior to admission. Patient with no signs or symptoms of infection. UDS negative. Patient did have some chest pain prior to admission. EKG with no ischemic changes noted. Hemoglobin A1c was 13.1 03/14/2017. Will cycle cardiac enzymes every 6 hours 3. Check a 2-D echo. Patient currently on insulin drip/glucose stabilizer and will transition to Lantus 30 units twice daily. Discontinue glucose stabilizer 2 hours after Lantus has been given. Place on resistant sliding scale insulin. Also place on meal coverage insulin with NovoLog 4 units 3 times daily. Will likely need outpatient diabetes education. Diabetic coordinator following.  #2 acute metabolic encephalopathy Likely secondary to problem #1. Patient with clinical improvement. CT head negative. Follow.  #3 acute on chronic kidney disease stage III/IV Likely secondary to a prerenal azotemia. Last creatinine noted in  computer was 1.66 in June 2018. Creatinine on admission was 2.21. Likely secondary to wanting depletion secondary to problem #1 in the setting of ARB and diuretic. ARB and diuretic on hold. Continue IV fluids. Follow.  #4 chronic pain Stable. Continue home regimen of Robaxin, Neurontin and when necessary Norco.  #5 hypertension Continue Lopressor.  #6 chest pain Patient did state at some left substernal chest pain prior to admission. EKG with no ischemic changes. Cycle cardiac enzymes every 6 hours 3. Check a 2-D echo. Currently chest pain-free. Follow.  #7 hyponatremia Improved with hydration.  #8 hyperlipidemia Check a fasting lipid panel. Continue statin.  No charge.    DVT prophylaxis: Heparin Code Status: Full Family Communication: Updated patient and husband at bedside. Disposition Plan: Remain the step down unit. Hopefully transfer to telemetry once patient has been transitioned to subcutaneous insulin and continued improvement with hemodynamic stability. Likely home once acute medical issues have resolved.   Consultants:   Diabetes coordinator  Procedures:  CT head 05/14/2017  Chest x-ray 05/14/2017    Antimicrobials:  None   Subjective: Patient states she's feeling better. Patient alert to self and place and time. Patient denies any shortness of breath. Patient stated did have some chest pain prior to admission. No nausea vomiting. Tolerating oral intake. Patient states in retrospect now she likely did not take her diabetic medications for approximately 3 days.  Objective: Vitals:   05/15/17 0510 05/15/17 0600 05/15/17 0700 05/15/17 0800  BP: (!) 147/65 (!) 142/59 (!) 120/53 130/61  Pulse: 75 65 65 67  Resp: 17 19 (!) 8   Temp:    (!) 97.5 F (36.4 C)  TempSrc:    Axillary  SpO2: 98%  96% 99% 98%  Weight:      Height:        Intake/Output Summary (Last 24 hours) at 05/15/17 0949 Last data filed at 05/15/17 0800  Gross per 24 hour  Intake            375.65 ml  Output                0 ml  Net           375.65 ml   Filed Weights   05/15/17 0300  Weight: 105 kg (231 lb 7.7 oz)    Examination:  General exam: Appears calm and comfortable. Obese Respiratory system: Clear to auscultation. Respiratory effort normal. Cardiovascular system: S1 & S2 heard, RRR. No JVD, murmurs, rubs, gallops or clicks. No pedal edema. Gastrointestinal system: Abdomen is nondistended, soft and nontender. No organomegaly or masses felt. Normal bowel sounds heard. Central nervous system: Alert and oriented. No focal neurological deficits. Extremities: Symmetric 5 x 5 power. Skin: No rashes, lesions or ulcers Psychiatry: Judgement and insight appear normal. Mood & affect appropriate.     Data Reviewed: I have personally reviewed following labs and imaging studies  CBC:  Recent Labs Lab 05/14/17 2305  WBC 10.7*  NEUTROABS 7.4  HGB 11.9*  HCT 32.6*  MCV 80.1  PLT 025   Basic Metabolic Panel:  Recent Labs Lab 05/14/17 2305 05/15/17 0246  NA 128* 133*  K 4.4 3.5  CL 95* 102  CO2 21* 21*  GLUCOSE 521* 402*  BUN 51* 46*  CREATININE 2.21* 2.06*  CALCIUM 8.6* 8.0*   GFR: Estimated Creatinine Clearance: 34.3 mL/min (A) (by C-G formula based on SCr of 2.06 mg/dL (H)). Liver Function Tests:  Recent Labs Lab 05/14/17 2305  AST 22  ALT 31  ALKPHOS 93  BILITOT 0.7  PROT 6.9  ALBUMIN 3.2*   No results for input(s): LIPASE, AMYLASE in the last 168 hours.  Recent Labs Lab 05/15/17 0246  AMMONIA 13   Coagulation Profile:  Recent Labs Lab 05/14/17 2305  INR 1.00   Cardiac Enzymes: No results for input(s): CKTOTAL, CKMB, CKMBINDEX, TROPONINI in the last 168 hours. BNP (last 3 results) No results for input(s): PROBNP in the last 8760 hours. HbA1C: No results for input(s): HGBA1C in the last 72 hours. CBG:  Recent Labs Lab 05/15/17 0409 05/15/17 0515 05/15/17 0614 05/15/17 0728 05/15/17 0856  GLUCAP 369* 284* 211* 185*  163*   Lipid Profile: No results for input(s): CHOL, HDL, LDLCALC, TRIG, CHOLHDL, LDLDIRECT in the last 72 hours. Thyroid Function Tests:  Recent Labs  05/15/17 0246  TSH 2.009   Anemia Panel: No results for input(s): VITAMINB12, FOLATE, FERRITIN, TIBC, IRON, RETICCTPCT in the last 72 hours. Sepsis Labs:  Recent Labs Lab 05/14/17 2320 05/15/17 0246  LATICACIDVEN 2.06* 1.8    Recent Results (from the past 240 hour(s))  MRSA PCR Screening     Status: None   Collection Time: 05/15/17  2:40 AM  Result Value Ref Range Status   MRSA by PCR NEGATIVE NEGATIVE Final    Comment:        The GeneXpert MRSA Assay (FDA approved for NASAL specimens only), is one component of a comprehensive MRSA colonization surveillance program. It is not intended to diagnose MRSA infection nor to guide or monitor treatment for MRSA infections.          Radiology Studies: Dg Chest 2 View  Result Date: 05/14/2017 CLINICAL DATA:  Altered mental status, confusion. History  of diabetes and hypertension. EXAM: CHEST  2 VIEW COMPARISON:  Chest radiograph May 01, 2017 FINDINGS: Cardiac silhouette is mildly enlarged and unchanged. Mediastinal silhouette is nonsuspicious. No pleural effusion or focal consolidation. No pneumothorax. Large body habitus. Osseous structures are unchanged. IMPRESSION: Stable cardiomegaly, no acute pulmonary process. Electronically Signed   By: Elon Alas M.D.   On: 05/14/2017 23:36   Ct Head Wo Contrast  Result Date: 05/14/2017 CLINICAL DATA:  Altered mental status today. History of hypertension, hyperlipidemia and diabetes. EXAM: CT HEAD WITHOUT CONTRAST TECHNIQUE: Contiguous axial images were obtained from the base of the skull through the vertex without intravenous contrast. COMPARISON:  MRI of the head August 09, 2016 FINDINGS: BRAIN: No intraparenchymal hemorrhage, mass effect nor midline shift. The ventricles and sulci are normal for age. Patchy supratentorial  white matter hypodensities. No acute large vascular territory infarcts. No abnormal extra-axial fluid collections. Basal cisterns are patent. VASCULAR: Mild calcific atherosclerosis of the carotid siphons. SKULL: No skull fracture. No significant scalp soft tissue swelling. SINUSES/ORBITS: The mastoid air-cells and included paranasal sinuses are well-aerated.The included ocular globes and orbital contents are non-suspicious. Status post bilateral ocular lens implants. OTHER: None. IMPRESSION: 1. No acute intracranial process. 2. Moderate matter changes compatible with chronic small vessel ischemic disease, advanced for age and, from prior MRI given differences in imaging technique. Electronically Signed   By: Elon Alas M.D.   On: 05/14/2017 23:39        Scheduled Meds: . gabapentin  100 mg Oral TID  . heparin  5,000 Units Subcutaneous Q8H  . methocarbamol  500 mg Oral BID  . metoprolol tartrate  25 mg Oral BID  . rosuvastatin  10 mg Oral q1800   Continuous Infusions: . sodium chloride Stopped (05/15/17 0624)  . dextrose 5 % and 0.45% NaCl 50 mL/hr at 05/15/17 0800  . insulin (NOVOLIN-R) infusion 5 Units/hr (05/15/17 0800)     LOS: 0 days    Time spent: 17 mins    THOMPSON,DANIEL, MD [facility] Pager [phone]  If 7PM-7AM, please contact night-coverage [email or web address] Password [email or web address] 05/15/2017, 9:49 AM

## 2017-05-15 NOTE — Progress Notes (Signed)
Inpatient Diabetes Program Recommendations  AACE/ADA: New Consensus Statement on Inpatient Glycemic Control (2015)  Target Ranges:  Prepandial:   less than 140 mg/dL      Peak postprandial:   less than 180 mg/dL (1-2 hours)      Critically ill patients:  140 - 180 mg/dL   Lab Results  Component Value Date   GLUCAP 165 (H) 05/15/2017   HGBA1C 13.1 03/14/2017    Review of Glycemic Control  Diabetes history: DM2 Outpatient Diabetes medications: Lantus 80 units QAM, Trulicity .75 mg Q Saturdays, metformin 1000 mg bid Current orders for Inpatient glycemic control: Lantus 30 units bid, Novolog 0-20 units tidwc and hs + 4 units tidwc  Spoke with pt regarding her diabetes control at home. Pt states she did not take any insulin or meds X 3 days, and doesn't know why. States she had bad headache, did not check blood sugars. Usually blood sugars run high. On no rapid-acting insulin at home.  Pt states she has an orange card and sees Dr. Kenton Kingfisher as her PCP. Previously pt of Dr. Cordelia Pen.  Inpatient Diabetes Program Recommendations:    Transitioning off insulin drip. Will watch post-prandial blood sugars. Likely will need rapid-acting insulin for home. Order OP Diabetes Education consult.  Continue to follow.  Thank you. Lorenda Peck, RD, LDN, CDE Inpatient Diabetes Coordinator [phone]

## 2017-05-15 NOTE — Care Management Note (Signed)
Case Management Note  Patient Details  Name: SREENIDHI GANSON MRN: [id number] Date of Birth: 1956/03/27  Subjective/Objective:       Fever with aki             Action/Plan: Date:  May 15, 2017 Chart reviewed for concurrent status and case management needs. Will continue to follow patient progress. Discharge Planning: following for needs Expected discharge date: [id number] Velva Harman, BSN, [place], [place]  Expected Discharge Date:                  Expected Discharge Plan:  Home/Self Care  In-House Referral:     Discharge planning Services  CM Consult  Post Acute Care Choice:    Choice offered to:     DME Arranged:    DME Agency:     HH Arranged:    HH Agency:     Status of Service:  In process, will continue to follow  If discussed at Long Length of Stay Meetings, dates discussed:    Additional Comments:  Leeroy Cha, RN 05/15/2017, 8:34 AM

## 2017-05-15 NOTE — Progress Notes (Signed)
CRITICAL VALUE ALERT  Critical Value:  Troponin: 0.07  Date & Time Notied:  05/15/2017 1104  Provider Notified: [place]  Orders Received/Actions taken: No further orders at this time

## 2017-05-15 NOTE — H&P (Signed)
History and Physical    BRIANNA BENNETT ACZ:[id number] DOB: December 26, 1955 DOA: 05/14/2017  PCP: Shirline Frees, MD   Patient coming from: Home  Chief Complaint: Confusion   HPI: Latasha [name] is a 61 y.o. female with medical history significant for insulin-dependent diabetes mellitus, hypertension, and chronic pain, presenting to the emergency department with confusion. History is provided by the family who reports that the patient has been confused today, given example of her urinating in the kitchen, thinking it was the bathroom. There was no recent fall or trauma per their knowledge and no alcohol or illicit substance use. Patient's has been, at the bedside, reports that Ms. Lahey was in her usual state when he left for work this morning, but did not recognize him when he returned this evening. She complained of her chronic back pain, but nothing else.  ED Course: Upon arrival to the ED, patient is found to be afebrile, saturating well on room air, and with vitals otherwise stable. EKG features a sinus rhythm with repolarization abnormality. Chest x-ray is notable for stable cardiomegaly but negative for acute cardiopulmonary disease. Noncontrast head CT negative for acute intracranial abnormality. Chemistry panel features a sodium of 128, bicarbonate of 21, normal anion gap, glucose of 521, BUN 51, and creatinine of 2.21, similar to that from two weeks earlier, but up from 1.66 two months ago. CBC is notable for slight leukocytosis to 10,700 and a stable normocytic anemia with hemoglobin of 11.9. Lactic acid slightly elevated to 2.06. INR is normal, troponin is within normal limits, UDS is negative, and ethanol level is undetectable. Urinalysis features glucosuria and proteinuria, but no ketones or evidence for infection. Blood cultures were obtained, 2 L normal saline were administered, and the patient was started on insulin infusion. She remained hemodynamically stable and in no apparent respiratory  distress, and will be admitted to the stepdown unit for ongoing evaluation and management of acute encephalopathy, likely metabolic in the setting of HHS.  Review of Systems:  Unable to complete ROS secondary to patient's clinical condition with encephalopathy.  Past Medical History:  Diagnosis Date  . BPPV (benign paroxysmal positional vertigo)   . Diabetes mellitus   . Hyperlipidemia   . Hypertension   . Palpitations     Past Surgical History:  Procedure Laterality Date  . ABDOMINAL HYSTERECTOMY    . EYE SURGERY     cataract/left eye, right eye had bleeding in back  . KNEE SURGERY    . TRIGGER FINGER RELEASE       reports that she has never smoked. She has never used smokeless tobacco. She reports that she drinks alcohol. She reports that she does not use drugs.  No Known Allergies  Family History  Problem Relation Age of Onset  . Heart disease Father   . Hypertension Father   . Alzheimer's disease Mother   . Diabetes Mother   . Diabetes Sister   . Dementia Other      Prior to Admission medications   Medication Sig Start Date End Date Taking? Authorizing Provider  Dulaglutide (TRULICITY) 1.09 NA/3.5TD SOPN Inject 0.75 mg into the skin once a week. 04/18/17   Renato Shin, MD  gabapentin (NEURONTIN) 100 MG capsule Take 100 mg by mouth 3 (three) times daily.    [provider]  HYDROcodone-acetaminophen (NORCO/VICODIN) 5-325 MG tablet Take 1-2 tablets by mouth every 4 (four) hours as needed for moderate pain or severe pain. 05/02/17   Charlesetta Shanks, MD  Insulin Glargine (TOUJEO  SOLOSTAR) 300 UNIT/ML SOPN Inject 80 Units into the skin every morning. And pen needles 1/day 04/18/17   Renato Shin, MD  metFORMIN (GLUCOPHAGE) 1000 MG tablet Take 1 tablet by mouth 2 (two) times daily. 04/26/16   [provider]  methocarbamol (ROBAXIN) 500 MG tablet Take 1 tablet (500 mg total) by mouth 2 (two) times daily. Patient not taking: Reported on 05/01/2017 07/13/16    Hyman Bible, PA-C  metoprolol tartrate (LOPRESSOR) 25 MG tablet Take 25 mg by mouth 2 (two) times daily.    [provider]  olmesartan (BENICAR) 40 MG tablet Take 40 mg by mouth daily.    [provider]  ondansetron (ZOFRAN) 4 MG tablet Take 1 tablet (4 mg total) by mouth every 6 (six) hours. Patient not taking: Reported on 05/01/2017 07/13/16   Hyman Bible, PA-C  potassium chloride SA (K-DUR,KLOR-CON) 20 MEQ tablet Take 1 tablet (20 mEq total) by mouth daily. Patient not taking: Reported on 05/01/2017 03/19/17   Forde Dandy, MD  rosuvastatin (CRESTOR) 10 MG tablet Take 10 mg by mouth daily.    [provider]  traMADol (ULTRAM) 50 MG tablet Take 1 tablet (50 mg total) by mouth every 6 (six) hours as needed. Patient not taking: Reported on 05/01/2017 07/13/16   Hyman Bible, PA-C    Physical Exam: Vitals:   05/14/17 2255 05/14/17 2337 05/15/17 0041  BP: (!) 150/86 (!) 144/120 (!) 156/84  Pulse: 99 91 93  Resp: 20 16 20   Temp: 100 F (37.8 C)    TempSrc: Oral    SpO2: 98% 94% 94%      Constitutional: NAD, calm, comfortable Eyes: PERTLA, lids and conjunctivae normal ENMT: Mucous membranes are moist. Posterior pharynx clear of any exudate or lesions.   Neck: normal, supple, no masses, no thyromegaly Respiratory: clear to auscultation bilaterally, no wheezing, no crackles. Normal respiratory effort.   Cardiovascular: S1 & S2 heard, regular rate and rhythm. No significant JVD. Abdomen: No distension, no tenderness, no masses palpated. Bowel sounds normal.  Musculoskeletal: no clubbing / cyanosis. No joint deformity upper and lower extremities.   Skin: no significant rashes, lesions, ulcers. Warm, dry, well-perfused. Neurologic: CN 2-12 grossly intact. Sensation intact, DTR normal. Strength 5/5 in all 4 limbs.  Psychiatric: Alert and oriented to person and place. Not oriented to situation, month, or year.     Labs on Admission: I have  personally reviewed following labs and imaging studies  CBC:  Recent Labs Lab 05/14/17 2305  WBC 10.7*  NEUTROABS 7.4  HGB 11.9*  HCT 32.6*  MCV 80.1  PLT 258   Basic Metabolic Panel:  Recent Labs Lab 05/14/17 2305  NA 128*  K 4.4  CL 95*  CO2 21*  GLUCOSE 521*  BUN 51*  CREATININE 2.21*  CALCIUM 8.6*   GFR: Estimated Creatinine Clearance: 32.6 mL/min (A) (by C-G formula based on SCr of 2.21 mg/dL (H)). Liver Function Tests:  Recent Labs Lab 05/14/17 2305  AST 22  ALT 31  ALKPHOS 93  BILITOT 0.7  PROT 6.9  ALBUMIN 3.2*   No results for input(s): LIPASE, AMYLASE in the last 168 hours. No results for input(s): AMMONIA in the last 168 hours. Coagulation Profile:  Recent Labs Lab 05/14/17 2305  INR 1.00   Cardiac Enzymes: No results for input(s): CKTOTAL, CKMB, CKMBINDEX, TROPONINI in the last 168 hours. BNP (last 3 results) No results for input(s): PROBNP in the last 8760 hours. HbA1C: No results for input(s): HGBA1C in the  last 72 hours. CBG:  Recent Labs Lab 05/14/17 2300 05/15/17 0040  GLUCAP 520* 451*   Lipid Profile: No results for input(s): CHOL, HDL, LDLCALC, TRIG, CHOLHDL, LDLDIRECT in the last 72 hours. Thyroid Function Tests: No results for input(s): TSH, T4TOTAL, FREET4, T3FREE, THYROIDAB in the last 72 hours. Anemia Panel: No results for input(s): VITAMINB12, FOLATE, FERRITIN, TIBC, IRON, RETICCTPCT in the last 72 hours. Urine analysis:    Component Value Date/Time   COLORURINE YELLOW 05/15/2017 0020   APPEARANCEUR CLEAR 05/15/2017 0020   LABSPEC 1.017 05/15/2017 0020   PHURINE 5.0 05/15/2017 0020   GLUCOSEU >=500 (A) 05/15/2017 0020   HGBUR SMALL (A) 05/15/2017 0020   BILIRUBINUR NEGATIVE 05/15/2017 0020   KETONESUR NEGATIVE 05/15/2017 0020   PROTEINUR >=300 (A) 05/15/2017 0020   UROBILINOGEN 0.2 02/14/2012 1639   NITRITE NEGATIVE 05/15/2017 0020   LEUKOCYTESUR NEGATIVE 05/15/2017 0020   Sepsis  Labs: @LABRCNTIP (procalcitonin:4,lacticidven:4) )No results found for this or any previous visit (from the past 240 hour(s)).   Radiological Exams on Admission: Dg Chest 2 View  Result Date: 05/14/2017 CLINICAL DATA:  Altered mental status, confusion. History of diabetes and hypertension. EXAM: CHEST  2 VIEW COMPARISON:  Chest radiograph May 01, 2017 FINDINGS: Cardiac silhouette is mildly enlarged and unchanged. Mediastinal silhouette is nonsuspicious. No pleural effusion or focal consolidation. No pneumothorax. Large body habitus. Osseous structures are unchanged. IMPRESSION: Stable cardiomegaly, no acute pulmonary process. Electronically Signed   By: Elon Alas M.D.   On: 05/14/2017 23:36   Ct Head Wo Contrast  Result Date: 05/14/2017 CLINICAL DATA:  Altered mental status today. History of hypertension, hyperlipidemia and diabetes. EXAM: CT HEAD WITHOUT CONTRAST TECHNIQUE: Contiguous axial images were obtained from the base of the skull through the vertex without intravenous contrast. COMPARISON:  MRI of the head August 09, 2016 FINDINGS: BRAIN: No intraparenchymal hemorrhage, mass effect nor midline shift. The ventricles and sulci are normal for age. Patchy supratentorial white matter hypodensities. No acute large vascular territory infarcts. No abnormal extra-axial fluid collections. Basal cisterns are patent. VASCULAR: Mild calcific atherosclerosis of the carotid siphons. SKULL: No skull fracture. No significant scalp soft tissue swelling. SINUSES/ORBITS: The mastoid air-cells and included paranasal sinuses are well-aerated.The included ocular globes and orbital contents are non-suspicious. Status post bilateral ocular lens implants. OTHER: None. IMPRESSION: 1. No acute intracranial process. 2. Moderate matter changes compatible with chronic small vessel ischemic disease, advanced for age and, from prior MRI given differences in imaging technique. Electronically Signed   By: Elon Alas  M.D.   On: 05/14/2017 23:39    EKG: Independently reviewed. Sinus rhythm, repolarization abnormality.   Assessment/Plan  1. Acute encephalopathy  - Pt presents with confusion that developed over the course of the day - No focal neurologic deficits, and no acute abnormality on head CT  - UDS negative and EtOH level <5 - Found to have marked electrolyte disturbances and a toxic-metabolic etiology is suspected  - Plan to extend the lab workup with TSH, RPR, B12, folate, and ammonia levels, correct electrolytes, monitor for infection, and continue supportive care   2. Hyperosmolar nonketotic state, IDDM  - Serum glucose is 521 with electrolyte disturbances and confusion  - She is a type II diabetic managed at home with Trulicity, metformin, and Toujeo 80 units qD  - She was given 2 liters NS in ED and started on insulin infusion  - Plan to to continue insulin infusion with frequent CBG's and serial chem panels   3. Hyponatremia  -  Serum sodium 128 on admission in setting of marked hyperglycemia and hypovolemia  - Treated in ED with 2 liters NS, continued on NS infusion  - Following serial chem panels  4. CKD stage III-IV - SCr is 2.21 on admission, stable from two weeks earlier, but had been 1.66 in June '18  - Suspect there is an acute component and improvement with IVF is anticipated  - Hold ARB, continue NS infusion, repeat chem panel   5. Hypertension  - BP slightly elevated in ED  - Continue Lopressor, hold ARB given elevated creatinine, and use hydralazine IVP's prn    6. Chronic pain  - No pain complaints currently  - Continue home regimen with Robaxin, Neurontin, and prn Norco    DVT prophylaxis: sq heparin  Code Status: Full  Family Communication: Family updated at bedside Disposition Plan: Admit to SDU Consults called: None Admission status: Inpatient    Vianne Bulls, MD [facility] Pager [phone]  If 7PM-7AM, please contact  night-coverage [email or web address] Password [email or web address]  05/15/2017, 1:16 AM

## 2017-05-15 NOTE — Progress Notes (Signed)
*  PRELIMINARY RESULTS* Echocardiogram 2D Echocardiogram has been performed.  Latasha [name] 05/15/2017, 3:12 PM

## 2017-05-15 NOTE — Progress Notes (Signed)
05/15/2017- Respiratory care note- venous ABG drawn and ran with results of:  Ph 7.406 pco2 34.9 po2 65.0 hco3 21.5  Unable to result through computer due to lab ordered as I-stat

## 2017-05-16 ENCOUNTER — Inpatient Hospital Stay (HOSPITAL_COMMUNITY): Payer: Medicaid Other

## 2017-05-16 DIAGNOSIS — N183 Chronic kidney disease, stage 3 (moderate): Secondary | ICD-10-CM

## 2017-05-16 DIAGNOSIS — N179 Acute kidney failure, unspecified: Secondary | ICD-10-CM

## 2017-05-16 LAB — BASIC METABOLIC PANEL
Anion gap: 8 (ref 5–15)
BUN: 46 mg/dL — AB (ref 6–20)
CALCIUM: 8.2 mg/dL — AB (ref 8.9–10.3)
CO2: 19 mmol/L — ABNORMAL LOW (ref 22–32)
Chloride: 106 mmol/L (ref 101–111)
Creatinine, Ser: 2.44 mg/dL — ABNORMAL HIGH (ref 0.44–1.00)
GFR calc Af Amer: 24 mL/min — ABNORMAL LOW (ref >60)
GFR, EST NON AFRICAN AMERICAN: 20 mL/min — AB (ref >60)
GLUCOSE: 269 mg/dL — AB (ref 65–99)
Potassium: 3.7 mmol/L (ref 3.5–5.1)
Sodium: 133 mmol/L — ABNORMAL LOW (ref 135–145)

## 2017-05-16 LAB — CBC
HCT: 31.4 % — ABNORMAL LOW (ref 36.0–46.0)
Hemoglobin: 10.8 g/dL — ABNORMAL LOW (ref 12.0–15.0)
MCH: 28.4 pg (ref 26.0–34.0)
MCHC: 34.4 g/dL (ref 30.0–36.0)
MCV: 82.6 fL (ref 78.0–100.0)
Platelets: 284 10*3/uL (ref 150–400)
RBC: 3.8 MIL/uL — ABNORMAL LOW (ref 3.87–5.11)
RDW: 12.9 % (ref 11.5–15.5)
WBC: 6.7 10*3/uL (ref 4.0–10.5)

## 2017-05-16 LAB — FOLATE RBC
Folate, Hemolysate: 428.5 ng/mL
Folate, RBC: UNDETERMINED ng/mL

## 2017-05-16 LAB — PROTEIN / CREATININE RATIO, URINE
CREATININE, URINE: 62.74 mg/dL
Protein Creatinine Ratio: 2.42 mg/mg{Cre} — ABNORMAL HIGH (ref 0.00–0.15)
TOTAL PROTEIN, URINE: 152 mg/dL

## 2017-05-16 LAB — GLUCOSE, CAPILLARY
GLUCOSE-CAPILLARY: 287 mg/dL — AB (ref 65–99)
GLUCOSE-CAPILLARY: 252 mg/dL — AB (ref 65–99)
Glucose-Capillary: 283 mg/dL — ABNORMAL HIGH (ref 65–99)
Glucose-Capillary: 289 mg/dL — ABNORMAL HIGH (ref 65–99)

## 2017-05-16 LAB — HEMATOLOGY COMMENTS:

## 2017-05-16 LAB — SODIUM, URINE, RANDOM: Sodium, Ur: 18 mmol/L

## 2017-05-16 LAB — CREATININE, URINE, RANDOM: CREATININE, URINE: 63.22 mg/dL

## 2017-05-16 MED ORDER — OXYCODONE HCL 5 MG PO TABS: 5.0000 mg | ORAL_TABLET | Freq: Four times a day (QID) | ORAL | Status: DC | PRN

## 2017-05-16 MED ORDER — INSULIN GLARGINE 100 UNIT/ML ~~LOC~~ SOLN
34.0000 [IU] | Freq: Two times a day (BID) | SUBCUTANEOUS | Status: DC
  Administered 2017-05-16: 34 [IU] via SUBCUTANEOUS
  Filled 2017-05-16 (×2): qty 0.34

## 2017-05-16 MED ORDER — SODIUM CHLORIDE 0.9 % IV BOLUS (SEPSIS)
500.0000 mL | Freq: Once | INTRAVENOUS | Status: AC
  Administered 2017-05-16: 500 mL via INTRAVENOUS

## 2017-05-16 MED ORDER — INSULIN ASPART 100 UNIT/ML ~~LOC~~ SOLN
8.0000 [IU] | Freq: Three times a day (TID) | SUBCUTANEOUS | Status: DC
  Administered 2017-05-16 – 2017-05-17 (×3): 8 [IU] via SUBCUTANEOUS

## 2017-05-16 MED ORDER — INSULIN GLARGINE 100 UNIT/ML ~~LOC~~ SOLN
38.0000 [IU] | Freq: Two times a day (BID) | SUBCUTANEOUS | Status: DC
  Administered 2017-05-16: 38 [IU] via SUBCUTANEOUS
  Filled 2017-05-16 (×2): qty 0.38

## 2017-05-16 NOTE — Progress Notes (Signed)
Inpatient Diabetes Program Recommendations  AACE/ADA: New Consensus Statement on Inpatient Glycemic Control (2015)  Target Ranges:  Prepandial:   less than 140 mg/dL      Peak postprandial:   less than 180 mg/dL (1-2 hours)      Critically ill patients:  140 - 180 mg/dL   Lab Results  Component Value Date   GLUCAP 287 (H) 05/16/2017   HGBA1C 13.1 03/14/2017    Review of Glycemic Control  FBSs and post-prandial blood sugars elevated. Lantus increased to 34 units bid. Needs meal coverage increase. Eating 100%.  Inpatient Diabetes Program Recommendations:    Increase Novolog to 8 units tidwc.  Will page MD.  Thank you. Lorenda Peck, RD, LDN, CDE Inpatient Diabetes Coordinator [phone]

## 2017-05-16 NOTE — Progress Notes (Signed)
PROGRESS NOTE    Latasha [name]  OVZ:[id number] DOB: 05-Mar-1956 DOA: 05/14/2017 PCP: Shirline Frees, MD    Brief Narrative:  Latasha [name] is a 61 year old female history of insulin-dependent diabetes mellitus, hypertension, chronic pain presented to the ED with altered mental status. Patient noted to be hyperglycemic with blood glucose levels over 600 as well as in acute on chronic kidney disease with a creatinine as high as 2.21. Patient admitted to the step down unit placed on the glucose stabilizer.   Assessment & Plan:   Principal Problem:   Hyperosmolar non-ketotic state in patient with type 2 diabetes mellitus ([place]) Active Problems:   Insulin-requiring or dependent type II diabetes mellitus ([place])   Essential hypertension   Encephalopathy acute   CKD (chronic kidney disease), stage III   Chronic pain   Acute kidney injury ([place])  #1 hyperosmolar nonketotic state in patient with type 2 diabetes mellitus/poorly controlled diabetes mellitus Likely secondary to medical noncompliance as patient stated she had not taken her diabetic medications 3 days prior to admission. Patient with no signs or symptoms of infection. UDS negative. Patient did have some chest pain prior to admission. EKG with no ischemic changes noted. Hemoglobin A1c was 13.1 03/14/2017. Will cycle cardiac enzymes every 6 hours 3. 2-D echo with a EF of 60-65%, no wall motion abnormalities, grade 1 diastolic dysfunction. Patient has been transitioned off insulin drip/glucose stabilizer. CBGs have ranged from 232 -287. Increase Lantus 34 units twice daily. Continue resistant sliding scale insulin. Continue meal coverage insulin with NovoLog 4 units 3 times daily. Will likely need outpatient diabetes education. Diabetic coordinator following.  #2 acute metabolic encephalopathy Likely secondary to problem #1. Patient with clinical improvement. CT head negative. No focal neurological deficits. Follow.  #3 acute on chronic  kidney disease stage III/IV Likely secondary to a prerenal azotemia. Last creatinine noted in computer was 1.66 in June 2018. Creatinine on admission was 2.21. Worsening renal function creatinine up to 2.44. Likely secondary to volume depletion secondary to problem #1 in the setting of ARB and diuretic. ARB and diuretic on hold. Will give IV fluid bolus. Increase fluid rate to 125 mL per hour.Follow.  #4 chronic pain Stable. Continue home regimen of Robaxin, Neurontin and when necessary Norco.  #5 hypertension Continue Lopressor. Will add Norvasc 5 mg daily.  #6 chest pain Patient did state at some left substernal chest pain prior to admission. EKG with no ischemic changes. Cycle cardiac enzymes minimally elevated however trending down. Patient currently chest pain-free. 2-D echo pending. Follow.   #7 hyponatremia Improved with hydration.  #8 hyperlipidemia Check a fasting lipid panel. Continue statin.  No charge.    DVT prophylaxis: Heparin Code Status: Full Family Communication: Updated patient. No family at bedside. Disposition Plan: Remain the step down unit. If blood pressure improves will transfer to telemetry floor.    Consultants:   Diabetes coordinator  Procedures:  CT head 05/14/2017  Chest x-ray 05/14/2017  2-D echo 05/15/2017  Antimicrobials:  None   Subjective: Patient states she's feeling better. Patient denies any shortness of breath. Patient stated did have some chest pain prior to admission. No nausea vomiting. Patient asking when she can go home. Patient does state that had an appointment with a nephrologist today  Objective: Vitals:   05/16/17 0400 05/16/17 0500 05/16/17 0725 05/16/17 0758  BP: (!) 187/74 (!) 159/52 (!) 158/69   Pulse: 74 69 69   Resp: 16  19   Temp:    97.6 F (  36.4 C)  TempSrc:    Axillary  SpO2: 96% 96% 96%   Weight:      Height:        Intake/Output Summary (Last 24 hours) at 05/16/17 1006 Last data filed at  05/16/17 0826  Gross per 24 hour  Intake          2127.86 ml  Output              400 ml  Net          1727.86 ml   Filed Weights   05/15/17 0300  Weight: 105 kg (231 lb 7.7 oz)    Examination:  General exam: Appears calm and comfortable. Obese Respiratory system: Clear to auscultation. Respiratory effort normal. Cardiovascular system: S1 & S2 heard, RRR. No JVD, murmurs, rubs, gallops or clicks. No pedal edema. Gastrointestinal system: Abdomen is nondistended, soft and nontender. No organomegaly or masses felt. Normal bowel sounds heard. Central nervous system: Alert and oriented. No focal neurological deficits. Extremities: Symmetric 5 x 5 power. Skin: No rashes, lesions or ulcers Psychiatry: Judgement and insight appear normal. Mood & affect appropriate.     Data Reviewed: I have personally reviewed following labs and imaging studies  CBC:  Recent Labs Lab 05/14/17 2305 05/16/17 0303  WBC 10.7* 6.7  NEUTROABS 7.4  --   HGB 11.9* 10.8*  HCT 32.6* 31.4*  MCV 80.1 82.6  PLT 347 161   Basic Metabolic Panel:  Recent Labs Lab 05/14/17 2305 05/15/17 0246 05/15/17 0954 05/15/17 1534 05/16/17 0303  NA 128* 133* 134* 133* 133*  K 4.4 3.5 3.5 4.0 3.7  CL 95* 102 103 103 106  CO2 21* 21* 23 23 19*  GLUCOSE 521* 402* 181* 280* 269*  BUN 51* 46* 47* 47* 46*  CREATININE 2.21* 2.06* 2.12* 2.31* 2.44*  CALCIUM 8.6* 8.0* 8.1* 7.9* 8.2*   GFR: Estimated Creatinine Clearance: 29 mL/min (A) (by C-G formula based on SCr of 2.44 mg/dL (H)). Liver Function Tests:  Recent Labs Lab 05/14/17 2305  AST 22  ALT 31  ALKPHOS 93  BILITOT 0.7  PROT 6.9  ALBUMIN 3.2*   No results for input(s): LIPASE, AMYLASE in the last 168 hours.  Recent Labs Lab 05/15/17 0246  AMMONIA 13   Coagulation Profile:  Recent Labs Lab 05/14/17 2305  INR 1.00   Cardiac Enzymes:  Recent Labs Lab 05/15/17 0954 05/15/17 1534 05/15/17 2138  TROPONINI 0.07* 0.05* 0.04*   BNP (last  3 results) No results for input(s): PROBNP in the last 8760 hours. HbA1C: No results for input(s): HGBA1C in the last 72 hours. CBG:  Recent Labs Lab 05/15/17 1056 05/15/17 1150 05/15/17 1614 05/15/17 2158 05/16/17 0730  GLUCAP 174* 165* 282* 232* 287*   Lipid Profile: No results for input(s): CHOL, HDL, LDLCALC, TRIG, CHOLHDL, LDLDIRECT in the last 72 hours. Thyroid Function Tests:  Recent Labs  05/15/17 0246  TSH 2.009   Anemia Panel:  Recent Labs  05/15/17 0246  VITAMINB12 834   Sepsis Labs:  Recent Labs Lab 05/14/17 2320 05/15/17 0246  LATICACIDVEN 2.06* 1.8    Recent Results (from the past 240 hour(s))  MRSA PCR Screening     Status: None   Collection Time: 05/15/17  2:40 AM  Result Value Ref Range Status   MRSA by PCR NEGATIVE NEGATIVE Final    Comment:        The GeneXpert MRSA Assay (FDA approved for NASAL specimens only), is one component of a comprehensive MRSA  colonization surveillance program. It is not intended to diagnose MRSA infection nor to guide or monitor treatment for MRSA infections.          Radiology Studies: Dg Chest 2 View  Result Date: 05/14/2017 CLINICAL DATA:  Altered mental status, confusion. History of diabetes and hypertension. EXAM: CHEST  2 VIEW COMPARISON:  Chest radiograph May 01, 2017 FINDINGS: Cardiac silhouette is mildly enlarged and unchanged. Mediastinal silhouette is nonsuspicious. No pleural effusion or focal consolidation. No pneumothorax. Large body habitus. Osseous structures are unchanged. IMPRESSION: Stable cardiomegaly, no acute pulmonary process. Electronically Signed   By: Elon Alas M.D.   On: 05/14/2017 23:36   Ct Head Wo Contrast  Result Date: 05/14/2017 CLINICAL DATA:  Altered mental status today. History of hypertension, hyperlipidemia and diabetes. EXAM: CT HEAD WITHOUT CONTRAST TECHNIQUE: Contiguous axial images were obtained from the base of the skull through the vertex without  intravenous contrast. COMPARISON:  MRI of the head August 09, 2016 FINDINGS: BRAIN: No intraparenchymal hemorrhage, mass effect nor midline shift. The ventricles and sulci are normal for age. Patchy supratentorial white matter hypodensities. No acute large vascular territory infarcts. No abnormal extra-axial fluid collections. Basal cisterns are patent. VASCULAR: Mild calcific atherosclerosis of the carotid siphons. SKULL: No skull fracture. No significant scalp soft tissue swelling. SINUSES/ORBITS: The mastoid air-cells and included paranasal sinuses are well-aerated.The included ocular globes and orbital contents are non-suspicious. Status post bilateral ocular lens implants. OTHER: None. IMPRESSION: 1. No acute intracranial process. 2. Moderate matter changes compatible with chronic small vessel ischemic disease, advanced for age and, from prior MRI given differences in imaging technique. Electronically Signed   By: Elon Alas M.D.   On: 05/14/2017 23:39   US Renal  Result Date: 05/16/2017 CLINICAL DATA:  Acute renal failure, history diabetes mellitus, hypertension EXAM: RENAL / URINARY TRACT ULTRASOUND COMPLETE COMPARISON:  CT abdomen and pelvis 05/01/2017 FINDINGS: Right Kidney: Length: 14.9 cm. Normal cortical thickness. Borderline increased cortical echogenicity. No mass, hydronephrosis or shadowing calcification. Left Kidney: Length: 13.4 cm. Normal cortical thickness. Slightly increased cortical echogenicity. No mass, hydronephrosis or shadowing calcification. Bladder: Partially distended, unremarkable. Incidentally noted echogenic hepatic parenchyma question fatty infiltration though this can be seen with cirrhosis and certain infiltrative disorders. IMPRESSION: Question medical renal disease changes. No evidence of renal mass or hydronephrosis. Possible fatty infiltration liver as discussed above. Electronically Signed   By: Lavonia Dana M.D.   On: 05/16/2017 09:38        Scheduled  Meds: . amLODipine  5 mg Oral Daily  . gabapentin  100 mg Oral TID  . heparin  5,000 Units Subcutaneous Q8H  . insulin aspart  0-20 Units Subcutaneous TID WC  . insulin aspart  0-5 Units Subcutaneous QHS  . insulin aspart  4 Units Subcutaneous TID WC  . insulin glargine  34 Units Subcutaneous BID  . methocarbamol  500 mg Oral BID  . metoprolol tartrate  25 mg Oral BID  . rosuvastatin  10 mg Oral q1800   Continuous Infusions: . sodium chloride 125 mL/hr at 05/16/17 0826  . insulin (NOVOLIN-R) infusion Stopped (05/15/17 1254)     LOS: 1 day    Time spent: 69 mins    Carlinda Ohlson, MD [facility] Pager [phone]  If 7PM-7AM, please contact night-coverage [email or web address] Password [email or web address] 05/16/2017, 10:06 AM

## 2017-05-17 LAB — CBC WITH DIFFERENTIAL/PLATELET
BASOS PCT: 0 %
Basophils Absolute: 0 10*3/uL (ref 0.0–0.1)
EOS ABS: 0.2 10*3/uL (ref 0.0–0.7)
EOS PCT: 3 %
HCT: 30.5 % — ABNORMAL LOW (ref 36.0–46.0)
HEMOGLOBIN: 10.4 g/dL — AB (ref 12.0–15.0)
Lymphocytes Relative: 31 %
Lymphs Abs: 1.8 10*3/uL (ref 0.7–4.0)
MCH: 28.9 pg (ref 26.0–34.0)
MCHC: 34.1 g/dL (ref 30.0–36.0)
MCV: 84.7 fL (ref 78.0–100.0)
MONOS PCT: 7 %
Monocytes Absolute: 0.4 10*3/uL (ref 0.1–1.0)
NEUTROS PCT: 59 %
Neutro Abs: 3.4 10*3/uL (ref 1.7–7.7)
PLATELETS: 282 10*3/uL (ref 150–400)
RBC: 3.6 MIL/uL — ABNORMAL LOW (ref 3.87–5.11)
RDW: 13 % (ref 11.5–15.5)
WBC: 5.8 10*3/uL (ref 4.0–10.5)

## 2017-05-17 LAB — BASIC METABOLIC PANEL
ANION GAP: 9 (ref 5–15)
BUN: 43 mg/dL — ABNORMAL HIGH (ref 6–20)
CALCIUM: 8.2 mg/dL — AB (ref 8.9–10.3)
CO2: 20 mmol/L — ABNORMAL LOW (ref 22–32)
Chloride: 107 mmol/L (ref 101–111)
Creatinine, Ser: 2.28 mg/dL — ABNORMAL HIGH (ref 0.44–1.00)
GFR, EST AFRICAN AMERICAN: 26 mL/min — AB (ref >60)
GFR, EST NON AFRICAN AMERICAN: 22 mL/min — AB (ref >60)
Glucose, Bld: 288 mg/dL — ABNORMAL HIGH (ref 65–99)
Potassium: 3.9 mmol/L (ref 3.5–5.1)
Sodium: 136 mmol/L (ref 135–145)

## 2017-05-17 LAB — GLUCOSE, CAPILLARY
GLUCOSE-CAPILLARY: 202 mg/dL — AB (ref 65–99)
Glucose-Capillary: 263 mg/dL — ABNORMAL HIGH (ref 65–99)

## 2017-05-17 LAB — FOLATE: FOLATE: 18.8 ng/mL (ref >5.9)

## 2017-05-17 LAB — IRON AND TIBC
Iron: 45 ug/dL (ref 28–170)
SATURATION RATIOS: 19 % (ref 10.4–31.8)
TIBC: 234 ug/dL — ABNORMAL LOW (ref 250–450)
UIBC: 189 ug/dL

## 2017-05-17 LAB — FERRITIN: Ferritin: 101 ng/mL (ref 11–307)

## 2017-05-17 LAB — VITAMIN B12: Vitamin B-12: 818 pg/mL (ref 180–914)

## 2017-05-17 MED ORDER — INSULIN GLARGINE 100 UNIT/ML ~~LOC~~ SOLN
42.0000 [IU] | Freq: Two times a day (BID) | SUBCUTANEOUS | Status: DC
  Administered 2017-05-17: 42 [IU] via SUBCUTANEOUS
  Filled 2017-05-17 (×2): qty 0.42

## 2017-05-17 MED ORDER — AMLODIPINE BESYLATE 10 MG PO TABS: 10.0000 mg | ORAL_TABLET | Freq: Every day | ORAL | 0 refills | Status: AC

## 2017-05-17 MED ORDER — INSULIN GLARGINE 300 UNIT/ML ~~LOC~~ SOPN: 42.0000 [IU] | PEN_INJECTOR | Freq: Two times a day (BID) | SUBCUTANEOUS | 0 refills | Status: DC

## 2017-05-17 NOTE — Discharge Summary (Signed)
Physician Discharge Summary  Latasha [name] ESP:[id number] DOB: 1955/10/15 DOA: 05/14/2017  PCP: Latasha Frees, MD  Admit date: 05/14/2017 Discharge date: 05/17/2017  Time spent: 65 minutes  Recommendations for Outpatient Follow-up:  1. Follow-up with Latasha Frees, MD in 1 week. On follow-up patient will need a basic metabolic profile done to follow-up on electrolytes and renal function. Patient was insistent on being discharged in a such renal function is to be followed closely. Patient's metformin was discontinued due to renal function. Patient's diabetes will need to be reassessed. Patient will need outpatient diabetes education. Patient may benefit from outpatient referral to nephrology as patient states she missed her appointment during this hospitalization. Patient may also benefit from referral to cardiology for outpatient stress test.   Discharge Diagnoses:  Principal Problem:   Hyperosmolar non-ketotic state in patient with type 2 diabetes mellitus ([place]) Active Problems:   Insulin-requiring or dependent type II diabetes mellitus ([place])   Essential hypertension   Encephalopathy acute   CKD (chronic kidney disease), stage III   Chronic pain   Acute kidney injury ([place])   ARF (acute renal failure) ([place])   Discharge Condition: Stable and improved  Diet recommendation: Crab modified  Filed Weights   05/15/17 0300 05/16/17 1000 05/17/17 1100  Weight: 105 kg (231 lb 7.7 oz) 108.5 kg (239 lb 3.2 oz) 110.4 kg (243 lb 6.4 oz)    History of present illness:  Per Dr Latasha [name] is a 61 y.o. female with medical history significant for insulin-dependent diabetes mellitus, hypertension, and chronic pain, presented to the emergency department with confusion. History was provided by the family who reported that the patient had been confused on the day of admission, given example of her urinating in the kitchen, thinking it was the bathroom. There was no recent fall or trauma per  their knowledge and no alcohol or illicit substance use. Patient's has been, at the bedside, reported that Latasha [name] was in her usual state when he left for work this morning, but did not recognize him when he returned this evening. She complained of her chronic back pain, but nothing else.  ED Course: Upon arrival to the ED, patient was found to be afebrile, saturating well on room air, and with vitals otherwise stable. EKG featured a sinus rhythm with repolarization abnormality. Chest x-ray was notable for stable cardiomegaly but negative for acute cardiopulmonary disease. Noncontrast head CT negative for acute intracranial abnormality. Chemistry panel featured a sodium of 128, bicarbonate of 21, normal anion gap, glucose of 521, BUN 51, and creatinine of 2.21, similar to that from two weeks earlier, but up from 1.66 two months ago. CBC was notable for slight leukocytosis to 10,700 and a stable normocytic anemia with hemoglobin of 11.9. Lactic acid slightly elevated to 2.06. INR is normal, troponin is within normal limits, UDS was negative, and ethanol level was undetectable. Urinalysis featured glucosuria and proteinuria, but no ketones or evidence for infection. Blood cultures were obtained, 2 L normal saline were administered, and the patient was started on insulin infusion. She remained hemodynamically stable and in no apparent respiratory distress, and will be admitted to the stepdown unit for ongoing evaluation and management of acute encephalopathy, likely metabolic in the setting of HHS.   Hospital Course:  #1 hyperosmolar nonketotic state in patient with type 2 diabetes mellitus/poorly controlled diabetes mellitus Likely secondary to medical noncompliance as patient stated she had not taken her diabetic medications 3 days prior to admission. Patient with no signs  or symptoms of infection. UDS negative. Patient did have some chest pain prior to admission. EKG with no ischemic changes noted.  Hemoglobin A1c was 13.1 03/14/2017. Cardiac enzymes which was cycled were minimally elevated and seemed to plateaued. 2-D echo with a EF of 60-65%, no wall motion abnormalities, grade 1 diastolic dysfunction. Patient on admission was placed on the glucose stabilizer as well as IV fluids. As patient's renal function improved patient was subsequently transitioned off insulin drip/glucose stabilizer. CBGs improved and patient's Lantus dose was adjusted such that by day of discharge patient's Lantus dose was 42 units twice daily. Patient was also maintained on meal coverage NovoLog 8 units 3 times daily as well as sliding scale insulin. Patient's blood glucose improved.  Patient will be discharged home on toujeo 42 units twice daily, trulicity. Patient's home regimen of metformin was discontinued secondary to elevated creatinine. Patient was insistent on being discharged home and a such patient be discharged home in stable and improved condition. Outpatient follow-up.   #2 acute metabolic encephalopathy Likely secondary to problem #1. Patient with clinical improvement with correction of her hyperglycemia. CT head negative. No focal neurological deficits. patient was close to baseline by day of discharge. Outpatient follow-up.    #3 acute on chronic kidney disease stage III/IV Likely secondary to a prerenal azotemia. Last creatinine noted in computer was 1.66 in June 2018. Creatinine on admission was 2.21.  Likely secondary to volume depletion secondary to problem #1 in the setting of ARB and diuretic. ARB and diuretic were held. Patient was placed on IV fluids due to DKA and monitored. Patient's renal function trended up as high as 2.44. IV fluids were discontinued. Renal ultrasound which was done was consistent with medical renal disease changes, negative for renal mass or hydronephrosis. Urinalysis which was obtained did show proteinuria. Urine protein to creatinine ratio was elevated at 2.42. IV fluids were  discontinued. Patient had good urine output of 1.9 L on the day prior to discharge. Renal function trended down to 2.28. Patient was insistent on being discharged home. Patient be discharged home off diuretics and ARB and is to follow-up with PCP one week for repeat basic metabolic profile to make sure renal function is improving. Patient will need to reschedule her appointment with nephrology in the outpatient setting.  #4 chronic pain Stable. Continued on home regimen of Robaxin, Neurontin and when necessary Norco.  #5 hypertension Patient was resumed on home regimen of Lopressor. Patient's diuretics were held as patient had presented with acute on chronic kidney disease stage III. Patient was started on Norvasc in addition to Lopressor for better blood pressure control. Outpatient follow-up.   #6 chest pain Patient did state at some left substernal chest pain prior to admission. EKG with no ischemic changes. Cardiac enzymes minimally elevated however trended down. Patient improved clinically. 2-D echo which was obtained had a EF of 60-65%, no wall motion abnormalities, grade 1 diastolic dysfunction. Pulmonary artery systolic pressure moderately increased. Patient will likely need to follow-up with PCP in the outpatient setting and may need to be referred to cardiologist for outpatient stress test. Outpatient follow-up. currently chest pain-free.  #7 hyponatremia Improved with hydration.  #8 hyperlipidemia Patient was maintained on a statin.   Procedures:  CT head 05/14/2017  Chest x-ray 05/14/2017  2-D echo 05/15/2017  Consultations:  Diabetes coordinator  Discharge Exam: Vitals:   05/17/17 0512 05/17/17 1407  BP: (!) 163/71 (!) 165/83  Pulse: 68 79  Resp: 16 18  Temp: 98.9  F (37.2 C) 97.9 F (36.6 C)  SpO2: 97% 100%    General: NAD Cardiovascular: RRR Respiratory: CTAB  Discharge Instructions   Discharge Instructions    Diet Carb Modified    Complete by:  As  directed    Increase activity slowly    Complete by:  As directed      Discharge Medication List as of 05/17/2017  3:03 PM    START taking these medications   Details  amLODipine (NORVASC) 10 MG tablet Take 1 tablet (10 mg total) by mouth daily., Starting Sun 05/18/2017, Print      CONTINUE these medications which have CHANGED   Details  Insulin Glargine (TOUJEO SOLOSTAR) 300 UNIT/ML SOPN Inject 42 Units into the skin 2 (two) times daily. And pen needles 1/day, Starting Sat 05/17/2017, Print      CONTINUE these medications which have NOT CHANGED   Details  Dulaglutide (TRULICITY) 6.07 PX/1.0GY SOPN Inject 0.75 mg into the skin once a week., Starting Fri 04/18/2017, Print    gabapentin (NEURONTIN) 100 MG capsule Take 100 mg by mouth 3 (three) times daily as needed (pain). , Historical Med    metoprolol tartrate (LOPRESSOR) 25 MG tablet Take 25 mg by mouth 2 (two) times daily., Historical Med    oxyCODONE (OXY IR/ROXICODONE) 5 MG immediate release tablet Take 5 mg by mouth every 6 (six) hours as needed for severe pain., Historical Med    Pitavastatin Calcium (LIVALO) 2 MG TABS Take 2 tablets by mouth daily., Historical Med      STOP taking these medications     furosemide (LASIX) 20 MG tablet      HYDROcodone-acetaminophen (NORCO/VICODIN) 5-325 MG tablet      metFORMIN (GLUCOPHAGE) 1000 MG tablet      methocarbamol (ROBAXIN) 500 MG tablet      rosuvastatin (CRESTOR) 10 MG tablet        No Known Allergies Follow-up Information    Latasha Frees, MD. Schedule an appointment as soon as possible for a visit in 1 week(s).   Specialty:  Family Medicine Contact information: [address] [place] [address] [phone]            The results of significant diagnostics from this hospitalization (including imaging, microbiology, ancillary and laboratory) are listed below for reference.    Significant Diagnostic Studies: Dg Chest 2 View  Result  Date: 05/14/2017 CLINICAL DATA:  Altered mental status, confusion. History of diabetes and hypertension. EXAM: CHEST  2 VIEW COMPARISON:  Chest radiograph May 01, 2017 FINDINGS: Cardiac silhouette is mildly enlarged and unchanged. Mediastinal silhouette is nonsuspicious. No pleural effusion or focal consolidation. No pneumothorax. Large body habitus. Osseous structures are unchanged. IMPRESSION: Stable cardiomegaly, no acute pulmonary process. Electronically Signed   By: Elon Alas M.D.   On: 05/14/2017 23:36   Dg Chest 2 View  Result Date: 05/01/2017 CLINICAL DATA:  Chest pain today. EXAM: CHEST  2 VIEW COMPARISON:  March 19, 2017 FINDINGS: The mediastinal contour is normal. The heart size is enlarged. Both lungs are clear. The visualized skeletal structures are unremarkable. IMPRESSION: No active cardiopulmonary disease. Electronically Signed   By: Abelardo Diesel M.D.   On: 05/01/2017 19:01   Ct Head Wo Contrast  Result Date: 05/14/2017 CLINICAL DATA:  Altered mental status today. History of hypertension, hyperlipidemia and diabetes. EXAM: CT HEAD WITHOUT CONTRAST TECHNIQUE: Contiguous axial images were obtained from the base of the skull through the vertex without intravenous contrast. COMPARISON:  MRI of  the head August 09, 2016 FINDINGS: BRAIN: No intraparenchymal hemorrhage, mass effect nor midline shift. The ventricles and sulci are normal for age. Patchy supratentorial white matter hypodensities. No acute large vascular territory infarcts. No abnormal extra-axial fluid collections. Basal cisterns are patent. VASCULAR: Mild calcific atherosclerosis of the carotid siphons. SKULL: No skull fracture. No significant scalp soft tissue swelling. SINUSES/ORBITS: The mastoid air-cells and included paranasal sinuses are well-aerated.The included ocular globes and orbital contents are non-suspicious. Status post bilateral ocular lens implants. OTHER: None. IMPRESSION: 1. No acute intracranial process. 2.  Moderate matter changes compatible with chronic small vessel ischemic disease, advanced for age and, from prior MRI given differences in imaging technique. Electronically Signed   By: Elon Alas M.D.   On: 05/14/2017 23:39   US Renal  Result Date: 05/16/2017 CLINICAL DATA:  Acute renal failure, history diabetes mellitus, hypertension EXAM: RENAL / URINARY TRACT ULTRASOUND COMPLETE COMPARISON:  CT abdomen and pelvis 05/01/2017 FINDINGS: Right Kidney: Length: 14.9 cm. Normal cortical thickness. Borderline increased cortical echogenicity. No mass, hydronephrosis or shadowing calcification. Left Kidney: Length: 13.4 cm. Normal cortical thickness. Slightly increased cortical echogenicity. No mass, hydronephrosis or shadowing calcification. Bladder: Partially distended, unremarkable. Incidentally noted echogenic hepatic parenchyma question fatty infiltration though this can be seen with cirrhosis and certain infiltrative disorders. IMPRESSION: Question medical renal disease changes. No evidence of renal mass or hydronephrosis. Possible fatty infiltration liver as discussed above. Electronically Signed   By: Lavonia Dana M.D.   On: 05/16/2017 09:38   Ct Renal Stone Study  Result Date: 05/02/2017 CLINICAL DATA:  Left flank pain and nausea, onset today. EXAM: CT ABDOMEN AND PELVIS WITHOUT CONTRAST TECHNIQUE: Multidetector CT imaging of the abdomen and pelvis was performed following the standard protocol without IV contrast. COMPARISON:  07/13/2016 FINDINGS: Lower chest: No acute abnormality. Hepatobiliary: No focal liver abnormality is seen. No gallstones, gallbladder wall thickening, or biliary dilatation. Pancreas: Unremarkable. No pancreatic ductal dilatation or surrounding inflammatory changes. Spleen: Normal in size without focal abnormality. Adrenals/Urinary Tract: Adrenal glands are unremarkable. Kidneys are normal, without renal calculi, focal lesion, or hydronephrosis. Bladder is unremarkable.  Stomach/Bowel: Stomach is within normal limits. Appendix is normal. No evidence of bowel wall thickening, distention, or inflammatory changes. Vascular/Lymphatic: The abdominal aorta is normal in caliber with mild atherosclerotic calcification. Bilateral renal artery origins are heavily calcified. Reproductive: Status post hysterectomy. No adnexal masses. Other: No focal inflammation. No ascites. Fat containing umbilical hernia. Musculoskeletal: No significant skeletal lesion. IMPRESSION: 1. No acute findings are evident in the abdomen or pelvis. 2. Aortic atherosclerosis. 3. Fat containing umbilical hernia. Electronically Signed   By: Andreas Newport M.D.   On: 05/02/2017 00:05    Microbiology: Recent Results (from the past 240 hour(s))  Culture, blood (single)     Status: None (Preliminary result)   Collection Time: 05/14/17 11:05 PM  Result Value Ref Range Status   Specimen Description BLOOD LEFT ANTECUBITAL  Final   Special Requests   Final    BOTTLES DRAWN AEROBIC AND ANAEROBIC Blood Culture adequate volume   Culture   Final    NO GROWTH 2 DAYS Performed at [facility], [address] [place], [place] [address]    Report Status PENDING  Incomplete  MRSA PCR Screening     Status: None   Collection Time: 05/15/17  2:40 AM  Result Value Ref Range Status   MRSA by PCR NEGATIVE NEGATIVE Final    Comment:        The GeneXpert  MRSA Assay (FDA approved for NASAL specimens only), is one component of a comprehensive MRSA colonization surveillance program. It is not intended to diagnose MRSA infection nor to guide or monitor treatment for MRSA infections.      Labs: Basic Metabolic Panel:  Recent Labs Lab 05/15/17 0246 05/15/17 0954 05/15/17 1534 05/16/17 0303 05/17/17 0751  NA 133* 134* 133* 133* 136  K 3.5 3.5 4.0 3.7 3.9  CL 102 103 103 106 107  CO2 21* 23 23 19* 20*  GLUCOSE 402* 181* 280* 269* 288*  BUN 46* 47* 47* 46* 43*  CREATININE 2.06* 2.12* 2.31* 2.44*  2.28*  CALCIUM 8.0* 8.1* 7.9* 8.2* 8.2*   Liver Function Tests:  Recent Labs Lab 05/14/17 2305  AST 22  ALT 31  ALKPHOS 93  BILITOT 0.7  PROT 6.9  ALBUMIN 3.2*   No results for input(s): LIPASE, AMYLASE in the last 168 hours.  Recent Labs Lab 05/15/17 0246  AMMONIA 13   CBC:  Recent Labs Lab 05/14/17 2305 05/15/17 0246 05/16/17 0303 05/17/17 0751  WBC 10.7*  --  6.7 5.8  NEUTROABS 7.4  --   --  3.4  HGB 11.9*  --  10.8* 10.4*  HCT 32.6* TEST REQUEST RECEIVED WITHOUT APPROPRIATE SPECIMEN 31.4* 30.5*  MCV 80.1  --  82.6 84.7  PLT 347  --  284 282   Cardiac Enzymes:  Recent Labs Lab 05/15/17 0954 05/15/17 1534 05/15/17 2138  TROPONINI 0.07* 0.05* 0.04*   BNP: BNP (last 3 results)  Recent Labs  05/26/16 0907  BNP 56.3    ProBNP (last 3 results) No results for input(s): PROBNP in the last 8760 hours.  CBG:  Recent Labs Lab 05/16/17 1142 05/16/17 1557 05/16/17 2026 05/17/17 0748 05/17/17 1208  GLUCAP 252* 283* 289* 263* 202*       Signed:  Tahnee Cifuentes MD.  [facility] 05/17/2017, 3:43 PM

## 2017-05-20 LAB — CULTURE, BLOOD (SINGLE)
Culture: NO GROWTH
Special Requests: ADEQUATE

## 2017-06-07 ENCOUNTER — Encounter (HOSPITAL_COMMUNITY): Payer: Self-pay | Admitting: *Deleted

## 2017-06-07 ENCOUNTER — Other Ambulatory Visit: Payer: Self-pay

## 2017-06-07 ENCOUNTER — Emergency Department (HOSPITAL_COMMUNITY): Payer: Medicaid Other

## 2017-06-07 ENCOUNTER — Inpatient Hospital Stay (HOSPITAL_COMMUNITY)
Admission: EM | Admit: 2017-06-07 | Discharge: 2017-06-10 | DRG: 291 | Disposition: A | Discharge status: Home / Self Care | Payer: Medicaid Other | Attending: Internal Medicine | Admitting: Internal Medicine

## 2017-06-07 DIAGNOSIS — J9601 Acute respiratory failure with hypoxia: Secondary | ICD-10-CM | POA: Diagnosis present

## 2017-06-07 DIAGNOSIS — R740 Nonspecific elevation of levels of transaminase and lactic acid dehydrogenase [LDH]: Secondary | ICD-10-CM

## 2017-06-07 DIAGNOSIS — R0602 Shortness of breath: Secondary | ICD-10-CM

## 2017-06-07 DIAGNOSIS — I5032 Chronic diastolic (congestive) heart failure: Secondary | ICD-10-CM | POA: Insufficient documentation

## 2017-06-07 DIAGNOSIS — Z23 Encounter for immunization: Secondary | ICD-10-CM

## 2017-06-07 DIAGNOSIS — I110 Hypertensive heart disease with heart failure: Secondary | ICD-10-CM | POA: Diagnosis present

## 2017-06-07 DIAGNOSIS — Z820 Family history of epilepsy and other diseases of the nervous system: Secondary | ICD-10-CM | POA: Diagnosis not present

## 2017-06-07 DIAGNOSIS — I10 Essential (primary) hypertension: Secondary | ICD-10-CM | POA: Diagnosis present

## 2017-06-07 DIAGNOSIS — R600 Localized edema: Secondary | ICD-10-CM

## 2017-06-07 DIAGNOSIS — E1165 Type 2 diabetes mellitus with hyperglycemia: Secondary | ICD-10-CM

## 2017-06-07 DIAGNOSIS — I5033 Acute on chronic diastolic (congestive) heart failure: Secondary | ICD-10-CM | POA: Diagnosis present

## 2017-06-07 DIAGNOSIS — R002 Palpitations: Secondary | ICD-10-CM | POA: Diagnosis present

## 2017-06-07 DIAGNOSIS — N179 Acute kidney failure, unspecified: Secondary | ICD-10-CM

## 2017-06-07 DIAGNOSIS — Z794 Long term (current) use of insulin: Secondary | ICD-10-CM

## 2017-06-07 DIAGNOSIS — R7401 Elevation of levels of liver transaminase levels: Secondary | ICD-10-CM

## 2017-06-07 DIAGNOSIS — Z8249 Family history of ischemic heart disease and other diseases of the circulatory system: Secondary | ICD-10-CM | POA: Diagnosis not present

## 2017-06-07 DIAGNOSIS — D649 Anemia, unspecified: Secondary | ICD-10-CM | POA: Diagnosis present

## 2017-06-07 DIAGNOSIS — N183 Chronic kidney disease, stage 3 unspecified: Secondary | ICD-10-CM | POA: Diagnosis present

## 2017-06-07 DIAGNOSIS — Z90710 Acquired absence of both cervix and uterus: Secondary | ICD-10-CM | POA: Diagnosis not present

## 2017-06-07 DIAGNOSIS — N189 Chronic kidney disease, unspecified: Secondary | ICD-10-CM | POA: Diagnosis present

## 2017-06-07 DIAGNOSIS — I130 Hypertensive heart and chronic kidney disease with heart failure and stage 1 through stage 4 chronic kidney disease, or unspecified chronic kidney disease: Principal | ICD-10-CM | POA: Diagnosis present

## 2017-06-07 DIAGNOSIS — Z6839 Body mass index (BMI) 39.0-39.9, adult: Secondary | ICD-10-CM | POA: Diagnosis not present

## 2017-06-07 DIAGNOSIS — G8929 Other chronic pain: Secondary | ICD-10-CM | POA: Diagnosis present

## 2017-06-07 DIAGNOSIS — H8110 Benign paroxysmal vertigo, unspecified ear: Secondary | ICD-10-CM | POA: Diagnosis present

## 2017-06-07 DIAGNOSIS — E1122 Type 2 diabetes mellitus with diabetic chronic kidney disease: Secondary | ICD-10-CM

## 2017-06-07 DIAGNOSIS — I509 Heart failure, unspecified: Secondary | ICD-10-CM

## 2017-06-07 DIAGNOSIS — Z833 Family history of diabetes mellitus: Secondary | ICD-10-CM | POA: Diagnosis not present

## 2017-06-07 DIAGNOSIS — E11319 Type 2 diabetes mellitus with unspecified diabetic retinopathy without macular edema: Secondary | ICD-10-CM | POA: Diagnosis present

## 2017-06-07 DIAGNOSIS — E785 Hyperlipidemia, unspecified: Secondary | ICD-10-CM | POA: Diagnosis present

## 2017-06-07 DIAGNOSIS — I700 Atherosclerosis of aorta: Secondary | ICD-10-CM | POA: Diagnosis present

## 2017-06-07 DIAGNOSIS — E119 Type 2 diabetes mellitus without complications: Secondary | ICD-10-CM

## 2017-06-07 HISTORY — DX: Cardiomegaly: I51.7

## 2017-06-07 HISTORY — DX: Chronic diastolic (congestive) heart failure: I50.32

## 2017-06-07 HISTORY — DX: Obesity, unspecified: E66.9

## 2017-06-07 LAB — CBC
HEMATOCRIT: 30.1 % — AB (ref 36.0–46.0)
Hemoglobin: 9.6 g/dL — ABNORMAL LOW (ref 12.0–15.0)
MCH: 27.7 pg (ref 26.0–34.0)
MCHC: 31.9 g/dL (ref 30.0–36.0)
MCV: 87 fL (ref 78.0–100.0)
Platelets: 338 10*3/uL (ref 150–400)
RBC: 3.46 MIL/uL — AB (ref 3.87–5.11)
RDW: 13.1 % (ref 11.5–15.5)
WBC: 5.5 10*3/uL (ref 4.0–10.5)

## 2017-06-07 LAB — BASIC METABOLIC PANEL
Anion gap: 8 (ref 5–15)
BUN: 39 mg/dL — AB (ref 6–20)
CHLORIDE: 104 mmol/L (ref 101–111)
CO2: 25 mmol/L (ref 22–32)
Calcium: 8.2 mg/dL — ABNORMAL LOW (ref 8.9–10.3)
Creatinine, Ser: 2.63 mg/dL — ABNORMAL HIGH (ref 0.44–1.00)
GFR calc non Af Amer: 19 mL/min — ABNORMAL LOW (ref >60)
GFR, EST AFRICAN AMERICAN: 22 mL/min — AB (ref >60)
Glucose, Bld: 207 mg/dL — ABNORMAL HIGH (ref 65–99)
POTASSIUM: 4.5 mmol/L (ref 3.5–5.1)
SODIUM: 137 mmol/L (ref 135–145)

## 2017-06-07 LAB — URINALYSIS, ROUTINE W REFLEX MICROSCOPIC
BACTERIA UA: NONE SEEN
BILIRUBIN URINE: NEGATIVE
Glucose, UA: NEGATIVE mg/dL
Ketones, ur: NEGATIVE mg/dL
Leukocytes, UA: NEGATIVE
Nitrite: NEGATIVE
Protein, ur: 100 mg/dL — AB
SPECIFIC GRAVITY, URINE: 1.005 (ref 1.005–1.030)
pH: 5 (ref 5.0–8.0)

## 2017-06-07 LAB — I-STAT TROPONIN, ED: Troponin i, poc: 0 ng/mL (ref 0.00–0.08)

## 2017-06-07 LAB — BRAIN NATRIURETIC PEPTIDE: B NATRIURETIC PEPTIDE 5: 141 pg/mL — AB (ref 0.0–100.0)

## 2017-06-07 LAB — GLUCOSE, CAPILLARY: GLUCOSE-CAPILLARY: 144 mg/dL — AB (ref 65–99)

## 2017-06-07 LAB — TROPONIN I: Troponin I: <0.03 ng/mL (ref <0.03)

## 2017-06-07 MED ORDER — AMLODIPINE BESYLATE 10 MG PO TABS
10.0000 mg | ORAL_TABLET | Freq: Every day | ORAL | Status: DC
  Administered 2017-06-08 – 2017-06-10 (×3): 10 mg via ORAL
  Filled 2017-06-07 (×3): qty 1

## 2017-06-07 MED ORDER — INSULIN GLARGINE 100 UNIT/ML ~~LOC~~ SOLN
44.0000 [IU] | Freq: Two times a day (BID) | SUBCUTANEOUS | Status: DC
  Administered 2017-06-07 – 2017-06-09 (×4): 44 [IU] via SUBCUTANEOUS
  Filled 2017-06-07 (×5): qty 0.44

## 2017-06-07 MED ORDER — SODIUM CHLORIDE 0.9% FLUSH
3.0000 mL | Freq: Two times a day (BID) | INTRAVENOUS | Status: DC
  Administered 2017-06-07 – 2017-06-09 (×5): 3 mL via INTRAVENOUS

## 2017-06-07 MED ORDER — POTASSIUM CHLORIDE CRYS ER 20 MEQ PO TBCR
40.0000 meq | EXTENDED_RELEASE_TABLET | Freq: Every day | ORAL | Status: DC
  Administered 2017-06-08 – 2017-06-10 (×3): 40 meq via ORAL
  Filled 2017-06-07 (×3): qty 2

## 2017-06-07 MED ORDER — ENOXAPARIN SODIUM 30 MG/0.3ML ~~LOC~~ SOLN
30.0000 mg | SUBCUTANEOUS | Status: DC
  Administered 2017-06-07: 30 mg via SUBCUTANEOUS
  Filled 2017-06-07: qty 0.3

## 2017-06-07 MED ORDER — OXYCODONE HCL 5 MG PO TABS: 5.0000 mg | ORAL_TABLET | Freq: Four times a day (QID) | ORAL | Status: DC | PRN

## 2017-06-07 MED ORDER — FUROSEMIDE 10 MG/ML IJ SOLN
60.0000 mg | Freq: Once | INTRAMUSCULAR | Status: AC
  Administered 2017-06-07: 60 mg via INTRAVENOUS
  Filled 2017-06-07: qty 6

## 2017-06-07 MED ORDER — INSULIN ASPART 100 UNIT/ML ~~LOC~~ SOLN
0.0000 [IU] | Freq: Three times a day (TID) | SUBCUTANEOUS | Status: DC
  Administered 2017-06-08 (×2): 3 [IU] via SUBCUTANEOUS
  Administered 2017-06-09: 2 [IU] via SUBCUTANEOUS
  Administered 2017-06-10: 3 [IU] via SUBCUTANEOUS
  Administered 2017-06-10: 5 [IU] via SUBCUTANEOUS

## 2017-06-07 MED ORDER — ACETAMINOPHEN 325 MG PO TABS: 650.0000 mg | ORAL_TABLET | ORAL | Status: DC | PRN

## 2017-06-07 MED ORDER — SODIUM CHLORIDE 0.9% FLUSH: 3.0000 mL | INTRAVENOUS | Status: DC | PRN

## 2017-06-07 MED ORDER — GABAPENTIN 100 MG PO CAPS
100.0000 mg | ORAL_CAPSULE | Freq: Three times a day (TID) | ORAL | Status: DC
  Administered 2017-06-07 – 2017-06-10 (×9): 100 mg via ORAL
  Filled 2017-06-07 (×9): qty 1

## 2017-06-07 MED ORDER — SODIUM CHLORIDE 0.9 % IV SOLN: 250.0000 mL | INTRAVENOUS | Status: DC | PRN

## 2017-06-07 MED ORDER — FUROSEMIDE 10 MG/ML IJ SOLN: 40.0000 mg | Freq: Two times a day (BID) | INTRAMUSCULAR | Status: DC

## 2017-06-07 MED ORDER — PNEUMOCOCCAL VAC POLYVALENT 25 MCG/0.5ML IJ INJ
0.5000 mL | INJECTION | INTRAMUSCULAR | Status: AC
  Administered 2017-06-10: 0.5 mL via INTRAMUSCULAR
  Filled 2017-06-07 (×2): qty 0.5

## 2017-06-07 MED ORDER — METOPROLOL TARTRATE 25 MG PO TABS
25.0000 mg | ORAL_TABLET | Freq: Two times a day (BID) | ORAL | Status: DC
  Administered 2017-06-07 – 2017-06-08 (×2): 25 mg via ORAL
  Filled 2017-06-07 (×2): qty 1

## 2017-06-07 MED ORDER — IPRATROPIUM-ALBUTEROL 0.5-2.5 (3) MG/3ML IN SOLN: 3.0000 mL | Freq: Four times a day (QID) | RESPIRATORY_TRACT | Status: DC | PRN

## 2017-06-07 MED ORDER — ONDANSETRON HCL 4 MG/2ML IJ SOLN: 4.0000 mg | Freq: Four times a day (QID) | INTRAMUSCULAR | Status: DC | PRN

## 2017-06-07 MED ORDER — ATORVASTATIN CALCIUM 40 MG PO TABS
40.0000 mg | ORAL_TABLET | Freq: Every day | ORAL | Status: DC
  Administered 2017-06-08 – 2017-06-10 (×3): 40 mg via ORAL
  Filled 2017-06-07 (×3): qty 1

## 2017-06-07 NOTE — ED Notes (Signed)
Pt sats 89%; placed on 2 L [place]. EDP made aware.

## 2017-06-07 NOTE — ED Notes (Signed)
Attempted to call report

## 2017-06-07 NOTE — H&P (Signed)
History and Physical    Latasha [name] RCV:[id number] DOB: 1956/08/11 DOA: 06/07/2017  PCP: Shirline Frees, MD   Patient coming from: Home  Chief Complaint: Shortness of Breath  HPI: Latasha [name] is an obese 61 y.o. female with medical history significant of HTN, HLD, Uncontrolled DM requiring Insulin, CKD Stage 3-4, BPPV, Obesity, and Hx of Palpatations who was recently admitted on 05/15/17 for HHS/HONK and subsequently discharged. On Discharge patient stopped taking her diuretics and states she has been drinking more and more water. Patient presented today with a CC of SOB and she states the SOB has gotten worse this last week. She also states that she has chronic leg swelling in the Left and that her swelling bilaterally has gotten worse. Patient states she becomes very dyspneic with exertion and even with ADLs (dressing) and states that she is unable to lie flat to sleep and has to prop herself up. No nausea, vomiting, or CP. Denied any lightheadedness. Denied any other complaints at this time. TRH was called to Admit for Heart Failure.   ED Course: Evaluated and had basic bloodwork. Was given IV diuresis of 60 mg Lasix. CXR and BNP were obtained and Cardiology was consulted by EDP.   Review of Systems: As per HPI otherwise 10 point review of systems negative. Denied burning or discomfort in urine. No bowel or bladder changes.    Past Medical History:  Diagnosis Date  . BPPV (benign paroxysmal positional vertigo)   . Diabetes mellitus   . Hyperlipidemia   . Hypertension   . Obesity   . Palpitations     Past Surgical History:  Procedure Laterality Date  . ABDOMINAL HYSTERECTOMY    . EYE SURGERY     cataract/left eye, right eye had bleeding in back  . KNEE SURGERY    . TRIGGER FINGER RELEASE     SOCIAL HISTORY  reports that she has never smoked. She has never used smokeless tobacco. She reports that she drinks alcohol. She reports that she does not use drugs.  No Known  Allergies  Family History  Problem Relation Age of Onset  . Heart disease Father   . Hypertension Father   . Alzheimer's disease Mother   . Diabetes Mother   . Diabetes Sister   . Dementia Other    Prior to Admission medications   Medication Sig Start Date End Date Taking? Authorizing Provider  acetaminophen (TYLENOL) 325 MG tablet Take 650 mg by mouth every 6 (six) hours as needed for mild pain.   Yes [provider]  albuterol (PROVENTIL HFA;VENTOLIN HFA) 108 (90 Base) MCG/ACT inhaler Inhale 2 puffs into the lungs every 6 (six) hours as needed for wheezing or shortness of breath.   Yes [provider]  amLODipine (NORVASC) 10 MG tablet Take 1 tablet (10 mg total) by mouth daily. 05/18/17  Yes Eugenie Filler, MD  atorvastatin (LIPITOR) 80 MG tablet Take 40 mg by mouth daily.   Yes [provider]  Dulaglutide (TRULICITY) 1.02 HE/5.2DP SOPN Inject 0.75 mg into the skin once a week. 04/18/17  Yes Renato Shin, MD  gabapentin (NEURONTIN) 100 MG capsule Take 100 mg by mouth 3 (three) times daily.    Yes [provider]  Insulin Glargine (TOUJEO SOLOSTAR) 300 UNIT/ML SOPN Inject 42 Units into the skin 2 (two) times daily. And pen needles 1/day Patient taking differently: Inject 44 Units into the skin 2 (two) times daily. And pen needles 1/day 05/17/17  Yes Irine Seal  V, MD  metoprolol tartrate (LOPRESSOR) 25 MG tablet Take 25 mg by mouth 2 (two) times daily.   Yes [provider]  oxyCODONE (OXY IR/ROXICODONE) 5 MG immediate release tablet Take 5 mg by mouth every 6 (six) hours as needed for severe pain.   Yes [provider]   Physical Exam: Vitals:   06/07/17 1306 06/07/17 1653 06/07/17 1700  BP: (!) 146/69 (!) 150/68 (!) 154/69  Pulse: 61 61 60  Resp: 18 19 (!) 22  Temp: (!) 97.5 F (36.4 C)    TempSrc: Oral    SpO2: 92% 93% 90%  Weight: 106.6 kg (235 lb)    Height: 5' 4.5" (1.638 m)     Constitutional: WN/WD obese  female NAD and appears calm wearing supplemental O2 Eyes: Llids and conjunctivae normal, sclerae anicteric ENMT: External Ears, Nose appear normal. Grossly normal hearing. Mucous membranes are moist.  Neck: Appears normal, supple, no cervical masses, normal ROM, no appreciable thyromegaly, Has some JVD Respiratory: Diminished to auscultation bilaterally with some crackles but no wheezing, rales, rhonchi. Normal respiratory effort and patient is not tachypenic. No accessory muscle use. Wearing Supplemental O2 via [place] Cardiovascular: RRR, no murmurs / rubs / gallops. S1 and S2 auscultated. 2+ LE Edema Abdomen: Soft, non-tender, Distended due to body habitus. No masses palpated. No appreciable hepatosplenomegaly. Bowel sounds positive.  GU: Deferred. Musculoskeletal: No clubbing / cyanosis of digits/nails. No joint deformity upper and lower extremities. Good ROM, no contractures.Skin: No rashes, lesions, ulcers. No induration; Warm and dry.  Neurologic: CN 2-12 grossly intact with no focal deficits. Sensation intact in all 4 Extremities. Romberg sign cerebellar reflexes not assessed.  Psychiatric: Normal judgment and insight. Alert and oriented x 3. Normal mood and appropriate affect.   Labs on Admission: I have personally reviewed following labs and imaging studies  CBC:  Recent Labs Lab 06/07/17 1317  WBC 5.5  HGB 9.6*  HCT 30.1*  MCV 87.0  PLT 161   Basic Metabolic Panel:  Recent Labs Lab 06/07/17 1317  NA 137  K 4.5  CL 104  CO2 25  GLUCOSE 207*  BUN 39*  CREATININE 2.63*  CALCIUM 8.2*   GFR: Estimated Creatinine Clearance: 27.4 mL/min (A) (by C-G formula based on SCr of 2.63 mg/dL (H)). Liver Function Tests: No results for input(s): AST, ALT, ALKPHOS, BILITOT, PROT, ALBUMIN in the last 168 hours. No results for input(s): LIPASE, AMYLASE in the last 168 hours. No results for input(s): AMMONIA in the last 168 hours. Coagulation Profile: No results for input(s): INR,  PROTIME in the last 168 hours. Cardiac Enzymes: No results for input(s): CKTOTAL, CKMB, CKMBINDEX, TROPONINI in the last 168 hours. BNP (last 3 results) No results for input(s): PROBNP in the last 8760 hours. HbA1C: No results for input(s): HGBA1C in the last 72 hours. CBG: No results for input(s): GLUCAP in the last 168 hours. Lipid Profile: No results for input(s): CHOL, HDL, LDLCALC, TRIG, CHOLHDL, LDLDIRECT in the last 72 hours. Thyroid Function Tests: No results for input(s): TSH, T4TOTAL, FREET4, T3FREE, THYROIDAB in the last 72 hours. Anemia Panel: No results for input(s): VITAMINB12, FOLATE, FERRITIN, TIBC, IRON, RETICCTPCT in the last 72 hours. Urine analysis:    Component Value Date/Time   COLORURINE YELLOW 05/15/2017 0020   APPEARANCEUR CLEAR 05/15/2017 0020   LABSPEC 1.017 05/15/2017 0020   PHURINE 5.0 05/15/2017 0020   GLUCOSEU >=500 (A) 05/15/2017 0020   HGBUR SMALL (A) 05/15/2017 0020   BILIRUBINUR NEGATIVE 05/15/2017 0020  KETONESUR NEGATIVE 05/15/2017 0020   PROTEINUR >=300 (A) 05/15/2017 0020   UROBILINOGEN 0.2 02/14/2012 1639   NITRITE NEGATIVE 05/15/2017 0020   LEUKOCYTESUR NEGATIVE 05/15/2017 0020   Sepsis Labs: !!!!!!!!!!!!!!!!!!!!!!!!!!!!!!!!!!!!!!!!!!!! @LABRCNTIP (procalcitonin:4,lacticidven:4) )No results found for this or any previous visit (from the past 240 hour(s)).   Radiological Exams on Admission: Dg Chest 2 View  Result Date: 06/07/2017 CLINICAL DATA:  Shortness of breath EXAM: CHEST  2 VIEW COMPARISON:  05/14/2017, 04/27/2015 FINDINGS: Cardiomegaly evident with increased diffuse interstitial opacities compatible with edema. Minor associated bibasilar atelectasis. Trace pleural effusions. Pattern compatible with mild CHF. No pneumothorax. Trachea is midline. Degenerative changes noted of the spine. IMPRESSION: Mild CHF pattern . Electronically Signed   By: Jerilynn Mages.  Shick M.D.   On: 06/07/2017 13:41    EKG: Independently reviewed. Showed Sinus  Rhythm at a rate of 63 with no ST Elevation on my interpretation but patient had T wave inversions in the lateral leads.   Assessment/Plan Active Problems:   Diastolic CHF (HCC)  Acute Respiratory Failure with Hypoxia 2/2 to Acute Diastolic CHF Exacerbation -Admitted Inpatient Telemetry -Symptoms consistent with CHF as patient had Worsening Leg Edema, SOB on Exertion, and Orthopnea; Patient also stopped Diuretics and had significant Water intake  -Maintain O2 Saturations >92% and Continue with Supplemental O2 via [place] -Continuous Pulse Oximetry an Wean O2 as Tolerated -Repeat CXR in AM -CXR showed evidence of CHF  Acute Decompensations of Grade 1 Diastolic CHF -BNP was 742.5 -ECHO 05/15/17 showed The cavity size was normal. Wall thickness was  increased in a pattern of moderate LVH. Systolic function was normal. The estimated ejection fraction was in the range of 60% to 65%. Wall motion was normal; there were no regional wall motion abnormalities. Doppler parameters are consistent with abnormal left ventricular relaxation (grade 1 diastolic dysfunction). 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 medical renal disease changes, negative for renal mass or hydronephrosis -Avoid Nephrotoxics  if Possible and Repeat CMP in AM  Poorly Controlled Diabetes Mellitus Type 2 -C/w Insulin Glaragine 44 units BID -Added Moderate Novolog SSI -Held Patient's Home Trulicity and Metformin was D/C'd -Continue to Monitor CBG's  -Last HbA1c was 13.1 -Repeat HbA1c this Visit   HTN -C/w Metoprolol and Amlodipine   HLD -Check Lipid Panel in AM  -C/w Home Statin  Hx of Palpitations -C/w Telemetry   Obesity -BMI 39.73 -Weight Loss Counseling Given -Dietician Consult  Bilateral Leg Edema -Likely from CHF but will need to r/o DVT as patient states Left one remains swollen -Bilateral LE Duplex ordered   DVT prophylaxis: Enoxaparin sq Code Status: FULL CODE Family Communication: No Family at bedside Disposition Plan: Home at D/C Consults called: Cardiology by EDP Admission status: Inpatient Telemetry  Severity of Illness: The appropriate patient status for this patient is INPATIENT.  Inpatient status is judged to be reasonable and necessary in order to provide the required intensity of service to ensure the patient's safety. The patient's presenting symptoms, physical exam findings, and initial radiographic and laboratory data in the context of their chronic comorbidities is felt to place them at high risk for further clinical deterioration. Furthermore, it is not anticipated that the patient will be medically stable for discharge from the hospital within 2 midnights of admission. The following factors support the patient status of inpatient.   " The patient's presenting symptoms include Dyspnea on Exertion, Orthopena,. " The worrisome physical exam findings include Bilateral Leg Edema. " The initial radiographic and laboratory data are worrisome because of Findings consistent . " The chronic co-morbidities include Uncontrolled DM, CKD Stage 3, HLD.   * I certify that at the point of admission it is my clinical judgment that the patient will require inpatient hospital care spanning  beyond 2 midnights from the point of admission due to high intensity of service, high risk for further deterioration and high frequency of surveillance required.Kerney Elbe, D.O. [facility] Pager [phone]  If 7PM-7AM, please contact night-coverage [email or web address] Password [email or web address]  06/07/2017, 6:01 PM

## 2017-06-07 NOTE — Progress Notes (Signed)
Pt is able to get up with minimal assistance, and does not want a Foley, will cont to monitor, thanks Buckner [place].

## 2017-06-07 NOTE — Consult Note (Signed)
CARDIOLOGY CONSULT NOTE   Primary Cardiologist: Dr. Quay Burow Reason for Consultation: Shortness of breath, pulmonary edema  HPI: Latasha [name] is a 61 y.o. female w/ a history of poorly controlled IDDM, HTN, CKD3, and chronic pain presenting with shortness of breath, hypoxemia, and pulmonary edema.  The patient was recently admitted to the hospital 2 weeks ago for confusion. Found to have hyperosmolar nonketotic state in setting of very poorly controlled DM. She additionally reported atypical chest pain at the time consistent with prior episodes of chest pain. Her troponin was minimally elevated to 0.07, trending downward. She was treated for her diabetes and underwent an echocardiogram, which revealed LVH and grade 1 diastolic dysfunction.    The patient now presents for worsening lower extremity edema, shortness of breath, and orthopnea after having stopped taking her home lasix for approximately 1 week. She has continued to have intermittent atypical chest pain since her recent discharge. She endorses approximately 15 to 20 lbs of weight gain in the past year. She notes that she stopped taking her lasix because she thought that it was making her legs swell. No fevers, chills. Does have nonproductive cough. No leg pain.   In the ED she was found to be hypoxemic w/ an O2 sat of 89% on room air. She had pitting edema to the bilateral knees and pulmonary edema on her chest x-ray, which was new compared with priors. Her troponin was negative and her BNP was mildly elevated to 141. She was admitted to the hospitalist service, who requested cardiology consultation for heart failure symptoms.   Review of Systems:     Cardiac Review of Systems: {Y] = yes [ ]  = no  Chest Pain [ Y  ]  Resting SOB [ Y ] Exertional SOB  [ Y ]  Orthopnea [ Y ]   Pedal Edema [ Y  ]    Palpitations [  ] Syncope  [  ]   Presyncope [   ]  General Review of Systems: [Y] = yes [  ]=no Constitional: recent weight  change [  ]; anorexia [  ]; fatigue [  ]; nausea [  ]; night sweats [  ]; fever [  ]; or chills [  ];                                                                     Eyes : blurred vision [  ]; diplopia [   ]; vision changes [  ];  Amaurosis fugax[  ]; Resp: cough [  ];  wheezing[  ];  hemoptysis[  ];  PND [  ];  GI:  gallstones[  ], vomiting[  ];  dysphagia[  ]; melena[  ];  hematochezia [  ]; heartburn[  ];   GU: kidney stones [  ]; hematuria[  ];   dysuria [  ];  nocturia[  ]; incontinence [  ];             Skin: rash, swelling[  ];, hair loss[  ];  peripheral edema[  ];  or itching[  ]; Musculosketetal: myalgias[  ];  joint swelling[  ];  joint erythema[  ];  joint pain[  ];  back pain[  ];  Heme/Lymph: bruising[  ];  bleeding[  ];  anemia[  ];  Neuro: TIA[  ];  headaches[  ];  stroke[  ];  vertigo[  ];  seizures[  ];   paresthesias[  ];  difficulty walking[  ];  Psych:depression[  ]; anxiety[  ];  Endocrine: diabetes[  ];  thyroid dysfunction[  ];  Other:  Past Medical History:  Diagnosis Date  . BPPV (benign paroxysmal positional vertigo)   . Diabetes mellitus   . Hyperlipidemia   . Hypertension   . Obesity   . Palpitations     (Not in a hospital admission)  Infusions:   No Known Allergies  Social History   Social History  . Marital status: Married    Spouse name: N/A  . Number of children: N/A  . Years of education: N/A   Occupational History  . Not on file.   Social History Main Topics  . Smoking status: Never Smoker  . Smokeless tobacco: Never Used  . Alcohol use Yes     Comment:  rare  . Drug use: No  . Sexual activity: Not on file   Other Topics Concern  . Not on file   Social History Narrative  . No narrative on file    Family History  Problem Relation Age of Onset  . Heart disease Father   . Hypertension Father   . Alzheimer's disease Mother   . Diabetes Mother   . Diabetes Sister   . Dementia Other     PHYSICAL EXAM: Vitals:    06/07/17 1700 06/07/17 1845  BP: (!) 154/69 (!) 156/72  Pulse: 60 62  Resp: (!) 22 18  Temp:    SpO2: 90% 96%    No intake or output data in the 24 hours ending 06/07/17 1923  General:  Well appearing. Obese. No respiratory difficulty HEENT: normal Neck: supple. JVP elevated to ~ 16 cm H2O. Carotids 2+ bilat; no bruits. No lymphadenopathy or thryomegaly appreciated. Cor: PMI nondisplaced. Distant heart sounds. Regular rate & rhythm. No rubs, gallops or murmurs. Lungs: clear Abdomen: soft, nontender, mildly distended. No hepatosplenomegaly. No bruits or masses. Good bowel sounds. Extremities: 2++ pitting edema just past knee bilaterally, symmetric, nontender Neuro: alert & oriented x 3, cranial nerves grossly intact. moves all 4 extremities w/o difficulty. Affect pleasant.  ECG: NSR, HR 63, normal axis, normal intervals, +LVH, no Q waves, normal R wave progression, mild T wave flattening in the lateral precoridal leads w/ TWI in V5 and V6; no change compared to prior ECG's  Results for orders placed or performed during the hospital encounter of 06/07/17 (from the past 24 hour(s))  Basic metabolic panel     Status: Abnormal   Collection Time: 06/07/17  1:17 PM  Result Value Ref Range   Sodium 137 135 - 145 mmol/L   Potassium 4.5 3.5 - 5.1 mmol/L   Chloride 104 101 - 111 mmol/L   CO2 25 22 - 32 mmol/L   Glucose, Bld 207 (H) 65 - 99 mg/dL   BUN 39 (H) 6 - 20 mg/dL   Creatinine, Ser 2.63 (H) 0.44 - 1.00 mg/dL   Calcium 8.2 (L) 8.9 - 10.3 mg/dL   GFR calc non Af Amer 19 (L) >60 mL/min   GFR calc Af Amer 22 (L) >60 mL/min   Anion gap 8 5 - 15  CBC     Status: Abnormal   Collection Time: 06/07/17  1:17 PM  Result Value Ref Range   WBC  5.5 4.0 - 10.5 K/uL   RBC 3.46 (L) 3.87 - 5.11 MIL/uL   Hemoglobin 9.6 (L) 12.0 - 15.0 g/dL   HCT 30.1 (L) 36.0 - 46.0 %   MCV 87.0 78.0 - 100.0 fL   MCH 27.7 26.0 - 34.0 pg   MCHC 31.9 30.0 - 36.0 g/dL   RDW 13.1 11.5 - 15.5 %   Platelets 338 150  - 400 K/uL  Brain natriuretic peptide     Status: Abnormal   Collection Time: 06/07/17  1:17 PM  Result Value Ref Range   B Natriuretic Peptide 141.0 (H) 0.0 - 100.0 pg/mL  I-stat troponin, ED     Status: None   Collection Time: 06/07/17  1:37 PM  Result Value Ref Range   Troponin i, poc 0.00 0.00 - 0.08 ng/mL   Comment 3           Dg Chest 2 View  Result Date: 06/07/2017 CLINICAL DATA:  Shortness of breath EXAM: CHEST  2 VIEW COMPARISON:  05/14/2017, 04/27/2015 FINDINGS: Cardiomegaly evident with increased diffuse interstitial opacities compatible with edema. Minor associated bibasilar atelectasis. Trace pleural effusions. Pattern compatible with mild CHF. No pneumothorax. Trachea is midline. Degenerative changes noted of the spine. IMPRESSION: Mild CHF pattern . Electronically Signed   By: Jerilynn Mages.  Shick M.D.   On: 06/07/2017 13:41    ASSESSMENT: Latasha [name] is a 61 y.o. female w/ a history of poorly controlled IDDM, HTN, CKD3, and chronic pain presenting with shortness of breath, hypoxemia, and pulmonary edema.  Presentation is most consistent with acute heart failure with preserved ejection fraction, likely caused by cessation of outpatient diuretics. Does not have any infectious symptoms. Unlikely to be VTE. No clear prodromal ischemic event. Would benefit from IV diuresis and resumption of an effective dose of oral diuretic on discharge.   PLAN/DISCUSSION: - IV lasix boluses (may start with 80mg ) with goal of -1 to -2L net negative in 24 hours - obtain additional troponin and ECG 6 to 8 hours after first check - no clear role for repeat echo given that she just had one 2 weeks ago - check urinalysis to evaluate for proteinuria - aggressive risk factor modification including tight blood pressure control and diabetes control, will almost certainly need to increase her home antihypertensive regimen  Lanna Poche, MD Cardiology fellow, PGY-5

## 2017-06-07 NOTE — ED Triage Notes (Signed)
Pt reports sob that started yesterday, had one episode of left side chest pain. No resp distress is noted at triage. ekg done.

## 2017-06-07 NOTE — ED Provider Notes (Addendum)
[facility] DEPT Provider Note   CSN: [id number] Arrival date & time: 06/07/17  1301     History   Chief Complaint Chief Complaint  Patient presents with  . Shortness of Breath    HPI Latasha [name] is a 61 y.o. female.  HPI  61 y.o female co cough, dyspnea that began 4 days ago.  Patient states symptoms worse with lying flat or exertion.  Patient states previously on lasix but has taken self off last week because she thought it made her swell more.  State she started taking it again Thursday night due to both legs swelling.  No fever,   Chest pain 3 nights ago for a brief second felt like something tearing in chest,, denies other chest pain.  Po ok, some chills.  Normal urination.   BS running high- states 300-400.  States Dr. Kenton Kingfisher stopped metformin and glipizide last month, taking insulin as prescribd 42 u bid.   Endocrine Dr. Loanne Drilling Renal Dr. Reubin Milan Cardiology Dr. York Grice seen - Echo normal 2015 Last echo 05/15/2017 with left ventricular wall thickness increased with left ventricular hypertrophy grade 1 diastolic dysfunction  Past Medical History:  Diagnosis Date  . BPPV (benign paroxysmal positional vertigo)   . Diabetes mellitus   . Hyperlipidemia   . Hypertension   . Obesity   . Palpitations     Patient Active Problem List   Diagnosis Date Noted  . ARF (acute renal failure) ([place])   . Encephalopathy acute 05/15/2017  . Hyperosmolar non-ketotic state in patient with type 2 diabetes mellitus ([place]) 05/15/2017  . CKD (chronic kidney disease), stage III 05/15/2017  . Chronic pain 05/15/2017  . Acute kidney injury ([place])   . Hyperlipemia 01/19/2014  . Palpitations 01/19/2014  . PVC's (premature ventricular contractions) 01/19/2014  . SCIATICA, RIGHT 12/12/2010  . GASTROESOPHAGEAL REFLUX DISEASE 02/09/2010  . LEG CRAMPS 02/09/2010  . OBESITY 12/01/2009  . VITAMIN D DEFICIENCY 10/13/2009  . CONSTIPATION 10/13/2009  . LIVER FUNCTION TESTS, ABNORMAL, HX OF  10/13/2009  . Essential hypertension 08/24/2009  . SHOULDER PAIN, RIGHT, CHRONIC 08/24/2009  . Insulin-requiring or dependent type II diabetes mellitus ([place]) 10/08/1991    Past Surgical History:  Procedure Laterality Date  . ABDOMINAL HYSTERECTOMY    . EYE SURGERY     cataract/left eye, right eye had bleeding in back  . KNEE SURGERY    . TRIGGER FINGER RELEASE      OB History    No data available       Home Medications    Prior to Admission medications   Medication Sig Start Date End Date Taking? Authorizing Provider  acetaminophen (TYLENOL) 325 MG tablet Take 650 mg by mouth every 6 (six) hours as needed for mild pain.   Yes [provider]  albuterol (PROVENTIL HFA;VENTOLIN HFA) 108 (90 Base) MCG/ACT inhaler Inhale 2 puffs into the lungs every 6 (six) hours as needed for wheezing or shortness of breath.   Yes [provider]  amLODipine (NORVASC) 10 MG tablet Take 1 tablet (10 mg total) by mouth daily. 05/18/17  Yes Eugenie Filler, MD  atorvastatin (LIPITOR) 80 MG tablet Take 40 mg by mouth daily.   Yes [provider]  Dulaglutide (TRULICITY) 9.67 EL/3.8BO SOPN Inject 0.75 mg into the skin once a week. 04/18/17  Yes Renato Shin, MD  gabapentin (NEURONTIN) 100 MG capsule Take 100 mg by mouth 3 (three) times daily.    Yes [provider]  Insulin Glargine (TOUJEO SOLOSTAR) 300  UNIT/ML SOPN Inject 42 Units into the skin 2 (two) times daily. And pen needles 1/day Patient taking differently: Inject 44 Units into the skin 2 (two) times daily. And pen needles 1/day 05/17/17  Yes Eugenie Filler, MD  metoprolol tartrate (LOPRESSOR) 25 MG tablet Take 25 mg by mouth 2 (two) times daily.   Yes [provider]  oxyCODONE (OXY IR/ROXICODONE) 5 MG immediate release tablet Take 5 mg by mouth every 6 (six) hours as needed for severe pain.   Yes [provider]    Family History Family History  Problem Relation Age of Onset  .  Heart disease Father   . Hypertension Father   . Alzheimer's disease Mother   . Diabetes Mother   . Diabetes Sister   . Dementia Other     Social History Social History  Substance Use Topics  . Smoking status: Never Smoker  . Smokeless tobacco: Never Used  . Alcohol use Yes     Comment:  rare     Allergies   Patient has no known allergies.   Review of Systems Review of Systems   Physical Exam Updated Vital Signs BP (!) 146/69 (BP Location: Right Arm)   Pulse 61   Temp (!) 97.5 F (36.4 C) (Oral)   Resp 18   Ht 1.638 m (5' 4.5")   Wt 106.6 kg (235 lb)   SpO2 92%   BMI 39.71 kg/m   Physical Exam  Constitutional: She is oriented to person, place, and time. She appears well-developed. No distress.  HENT:  Head: Normocephalic and atraumatic.  Right Ear: External ear normal.  Left Ear: External ear normal.  Mouth/Throat: Oropharynx is clear and moist.  Eyes: Pupils are equal, round, and reactive to light. EOM are normal.  Neck: Normal range of motion. Neck supple.  Cardiovascular: Normal rate, regular rhythm, normal heart sounds and intact distal pulses.   Pulmonary/Chest: Effort normal and breath sounds normal.  Abdominal: Soft. Bowel sounds are normal.  Musculoskeletal: She exhibits edema.  Pitting edema to knees bilaterally  Neurological: She is alert and oriented to person, place, and time.  Skin: Skin is warm and dry.  Nursing note and vitals reviewed.    ED Treatments / Results  Labs (all labs ordered are listed, but only abnormal results are displayed) Labs Reviewed  BASIC METABOLIC PANEL - Abnormal; Notable for the following:       Result Value   Glucose, Bld 207 (*)    BUN 39 (*)    Creatinine, Ser 2.63 (*)    Calcium 8.2 (*)    GFR calc non Af Amer 19 (*)    GFR calc Af Amer 22 (*)    All other components within normal limits  CBC - Abnormal; Notable for the following:    RBC 3.46 (*)    Hemoglobin 9.6 (*)    HCT 30.1 (*)    All other  components within normal limits  I-STAT TROPONIN, ED    EKG  EKG Interpretation  Date/Time:  Saturday June 07 2017 13:04:49 EDT Ventricular Rate:  63 PR Interval:  174 QRS Duration: 94 QT Interval:  434 QTC Calculation: 444 R Axis:   18 Text Interpretation:  Normal sinus rhythm Nonspecific T wave abnormality Abnormal ECG Confirmed by Pattricia Boss [phone]) on 06/07/2017 3:28:24 PM       Radiology Dg Chest 2 View  Result Date: 06/07/2017 CLINICAL DATA:  Shortness of breath EXAM: CHEST  2 VIEW COMPARISON:  05/14/2017, 04/27/2015  FINDINGS: Cardiomegaly evident with increased diffuse interstitial opacities compatible with edema. Minor associated bibasilar atelectasis. Trace pleural effusions. Pattern compatible with mild CHF. No pneumothorax. Trachea is midline. Degenerative changes noted of the spine. IMPRESSION: Mild CHF pattern . Electronically Signed   By: Jerilynn Mages.  Shick M.D.   On: 06/07/2017 13:41    Procedures Procedures (including critical care time)  Medications Ordered in ED Medications - No data to display   Initial Impression / Assessment and Plan / ED Course  I have reviewed the triage vital signs and the nursing notes.  Pertinent labs & imaging results that were available during my care of the patient were reviewed by me and considered in my medical decision making (see chart for details). Vitals:   06/07/17 1653 06/07/17 1700  BP: (!) 150/68 (!) 154/69  Pulse: 61 60  Resp: 19 (!) 22  Temp:    SpO2: 93% 90%     1- dyspnea- likely secondary to volume overload- patient quit taking lasix and has edema with some chf on cxr, but only mildly elevated bnp. sats 89% on room air and responsive to [place] at 2 l/m. Low suspicion for pe- no h.o. Clots, bilateral leg swelling present, no immobilization, or tachycardia.  Last echo 2015 with normal ef per cardiology note 2- renal insufficiency-creatinine elevated at 2.63 increaed from 2.28 on 8/11 3- anemia- decreasing  hgb  Discussed with attending physician andthey will admit to telemetry. [facility] consult cardiology Radiology consult called and they will consult. Final Clinical Impressions(s) / ED Diagnoses   Final diagnoses:  Congestive heart failure, unspecified HF chronicity, unspecified heart failure type [facility])    New Prescriptions New Prescriptions   No medications on file     Pattricia Boss, MD 06/07/17 Erskin Burnet    Pattricia Boss, MD 06/07/17 1755

## 2017-06-08 ENCOUNTER — Inpatient Hospital Stay (HOSPITAL_COMMUNITY): Payer: Medicaid Other

## 2017-06-08 ENCOUNTER — Encounter (HOSPITAL_COMMUNITY): Payer: Self-pay | Admitting: Internal Medicine

## 2017-06-08 DIAGNOSIS — R7401 Elevation of levels of liver transaminase levels: Secondary | ICD-10-CM

## 2017-06-08 DIAGNOSIS — I110 Hypertensive heart disease with heart failure: Secondary | ICD-10-CM | POA: Diagnosis present

## 2017-06-08 DIAGNOSIS — I5033 Acute on chronic diastolic (congestive) heart failure: Secondary | ICD-10-CM

## 2017-06-08 DIAGNOSIS — Z6841 Body Mass Index (BMI) 40.0 and over, adult: Secondary | ICD-10-CM

## 2017-06-08 DIAGNOSIS — R0602 Shortness of breath: Secondary | ICD-10-CM

## 2017-06-08 DIAGNOSIS — E119 Type 2 diabetes mellitus without complications: Secondary | ICD-10-CM

## 2017-06-08 DIAGNOSIS — R740 Nonspecific elevation of levels of transaminase and lactic acid dehydrogenase [LDH]: Secondary | ICD-10-CM

## 2017-06-08 DIAGNOSIS — I130 Hypertensive heart and chronic kidney disease with heart failure and stage 1 through stage 4 chronic kidney disease, or unspecified chronic kidney disease: Principal | ICD-10-CM

## 2017-06-08 DIAGNOSIS — R609 Edema, unspecified: Secondary | ICD-10-CM

## 2017-06-08 DIAGNOSIS — N189 Chronic kidney disease, unspecified: Secondary | ICD-10-CM

## 2017-06-08 DIAGNOSIS — R600 Localized edema: Secondary | ICD-10-CM | POA: Insufficient documentation

## 2017-06-08 LAB — GLUCOSE, CAPILLARY
GLUCOSE-CAPILLARY: 191 mg/dL — AB (ref 65–99)
Glucose-Capillary: 73 mg/dL (ref 65–99)
Glucose-Capillary: 199 mg/dL — ABNORMAL HIGH (ref 65–99)
Glucose-Capillary: 275 mg/dL — ABNORMAL HIGH (ref 65–99)

## 2017-06-08 LAB — CBC WITH DIFFERENTIAL/PLATELET
BASOS ABS: 0 10*3/uL (ref 0.0–0.1)
Basophils Relative: 0 %
EOS ABS: 0.1 10*3/uL (ref 0.0–0.7)
EOS PCT: 2 %
HCT: 28.2 % — ABNORMAL LOW (ref 36.0–46.0)
Hemoglobin: 9.1 g/dL — ABNORMAL LOW (ref 12.0–15.0)
LYMPHS PCT: 37 %
Lymphs Abs: 2.1 10*3/uL (ref 0.7–4.0)
MCH: 27.9 pg (ref 26.0–34.0)
MCHC: 32.3 g/dL (ref 30.0–36.0)
MCV: 86.5 fL (ref 78.0–100.0)
Monocytes Absolute: 0.5 10*3/uL (ref 0.1–1.0)
Monocytes Relative: 9 %
NEUTROS PCT: 52 %
Neutro Abs: 2.9 10*3/uL (ref 1.7–7.7)
PLATELETS: 330 10*3/uL (ref 150–400)
RBC: 3.26 MIL/uL — AB (ref 3.87–5.11)
RDW: 13.1 % (ref 11.5–15.5)
WBC: 5.6 10*3/uL (ref 4.0–10.5)

## 2017-06-08 LAB — TROPONIN I: Troponin I: <0.03 ng/mL (ref <0.03)

## 2017-06-08 LAB — COMPREHENSIVE METABOLIC PANEL
ALT: 96 U/L — AB (ref 14–54)
AST: 39 U/L (ref 15–41)
Albumin: 2.9 g/dL — ABNORMAL LOW (ref 3.5–5.0)
Alkaline Phosphatase: 200 U/L — ABNORMAL HIGH (ref 38–126)
Anion gap: 11 (ref 5–15)
BUN: 36 mg/dL — AB (ref 6–20)
CHLORIDE: 106 mmol/L (ref 101–111)
CO2: 24 mmol/L (ref 22–32)
CREATININE: 2.53 mg/dL — AB (ref 0.44–1.00)
Calcium: 8.1 mg/dL — ABNORMAL LOW (ref 8.9–10.3)
GFR calc Af Amer: 23 mL/min — ABNORMAL LOW (ref >60)
GFR calc non Af Amer: 20 mL/min — ABNORMAL LOW (ref >60)
GLUCOSE: 168 mg/dL — AB (ref 65–99)
Potassium: 4.1 mmol/L (ref 3.5–5.1)
SODIUM: 141 mmol/L (ref 135–145)
Total Bilirubin: 0.4 mg/dL (ref 0.3–1.2)
Total Protein: 6.3 g/dL — ABNORMAL LOW (ref 6.5–8.1)

## 2017-06-08 LAB — MAGNESIUM: Magnesium: 2.5 mg/dL — ABNORMAL HIGH (ref 1.7–2.4)

## 2017-06-08 LAB — LIPID PANEL
CHOL/HDL RATIO: 3.1 ratio
Cholesterol: 116 mg/dL (ref 0–200)
HDL: 38 mg/dL — ABNORMAL LOW (ref >40)
LDL Cholesterol: 55 mg/dL (ref 0–99)
Triglycerides: 114 mg/dL (ref <150)
VLDL: 23 mg/dL (ref 0–40)

## 2017-06-08 LAB — HEMOGLOBIN A1C

## 2017-06-08 LAB — PHOSPHORUS: PHOSPHORUS: 5.6 mg/dL — AB (ref 2.5–4.6)

## 2017-06-08 MED ORDER — FUROSEMIDE 10 MG/ML IJ SOLN
60.0000 mg | Freq: Two times a day (BID) | INTRAMUSCULAR | Status: DC
  Administered 2017-06-08 – 2017-06-09 (×4): 60 mg via INTRAVENOUS
  Filled 2017-06-08 (×4): qty 6

## 2017-06-08 MED ORDER — HYDRALAZINE HCL 20 MG/ML IJ SOLN: 10.0000 mg | Freq: Four times a day (QID) | INTRAMUSCULAR | Status: DC | PRN

## 2017-06-08 MED ORDER — CARVEDILOL 12.5 MG PO TABS
12.5000 mg | ORAL_TABLET | Freq: Two times a day (BID) | ORAL | Status: DC
  Administered 2017-06-08 – 2017-06-10 (×5): 12.5 mg via ORAL
  Filled 2017-06-08 (×5): qty 1

## 2017-06-08 MED ORDER — HEPARIN SODIUM (PORCINE) 5000 UNIT/ML IJ SOLN
5000.0000 [IU] | Freq: Three times a day (TID) | INTRAMUSCULAR | Status: DC
  Administered 2017-06-08 – 2017-06-10 (×7): 5000 [IU] via SUBCUTANEOUS
  Filled 2017-06-08 (×7): qty 1

## 2017-06-08 NOTE — Progress Notes (Signed)
Call from lab states that hgb A1C is likely inaccurate, will credit that lab back to pt's acct and re-order with send-out to ensure accurate result.

## 2017-06-08 NOTE — Progress Notes (Signed)
Progress Note  Patient Name: Latasha [name] Date of Encounter: 06/08/2017  Primary Cardiologist: Dr [name]  Subjective   Doing well today, the patient denies CP.  SOB is much improved.  No new concerns  Inpatient Medications    Scheduled Meds: . amLODipine  10 mg Oral Daily  . atorvastatin  40 mg Oral Daily  . furosemide  60 mg Intravenous BID  . gabapentin  100 mg Oral TID  . heparin subcutaneous  5,000 Units Subcutaneous Q8H  . insulin aspart  0-15 Units Subcutaneous TID WC  . insulin glargine  44 Units Subcutaneous BID  . metoprolol tartrate  25 mg Oral BID  . pneumococcal 23 valent vaccine  0.5 mL Intramuscular Tomorrow-1000  . potassium chloride  40 mEq Oral Daily  . sodium chloride flush  3 mL Intravenous Q12H   Continuous Infusions: . sodium chloride     PRN Meds: sodium chloride, acetaminophen, hydrALAZINE, ipratropium-albuterol, ondansetron (ZOFRAN) IV, oxyCODONE, sodium chloride flush   Vital Signs    Vitals:   06/07/17 2024 06/07/17 2329 06/08/17 0649 06/08/17 1048  BP: (!) 168/72 (!) 156/68 (!) 150/63 (!) 157/72  Pulse: 65 64 63 64  Resp: 18 18 18    Temp: 98.1 F (36.7 C) 97.8 F (36.6 C) 97.7 F (36.5 C)   TempSrc: Oral Oral Oral   SpO2: 95% 96% 94%   Weight: 257 lb 8 oz (116.8 kg)  254 lb 4.8 oz (115.3 kg)   Height:        Intake/Output Summary (Last 24 hours) at 06/08/17 1208 Last data filed at 06/08/17 1052  Gross per 24 hour  Intake              243 ml  Output             1000 ml  Net             -757 ml   Filed Weights   06/07/17 1306 06/07/17 2024 06/08/17 0649  Weight: 235 lb (106.6 kg) 257 lb 8 oz (116.8 kg) 254 lb 4.8 oz (115.3 kg)    Telemetry    Sinus rhythm - Personally Reviewed  Physical Exam   GEN- The patient is overweight appearing, alert and oriented x 3 today.   Head- normocephalic, atraumatic Eyes-  Sclera clear, conjunctiva pink Ears- hearing intact Oropharynx- clear Neck- supple, Lungs- decreased BS at bases,  normal work of breathing Heart- Regular rate and rhythm  GI- soft, NT, ND, + BS Extremities- no clubbing, cyanosis + dependant edema MS- no significant deformity or atrophy Skin- no rash or lesion Psych- euthymic mood, full affect Neuro- strength and sensation are intact   Labs    Chemistry Recent Labs Lab 06/07/17 1317 06/08/17 0157  NA 137 141  K 4.5 4.1  CL 104 106  CO2 25 24  GLUCOSE 207* 168*  BUN 39* 36*  CREATININE 2.63* 2.53*  CALCIUM 8.2* 8.1*  PROT  --  6.3*  ALBUMIN  --  2.9*  AST  --  39  ALT  --  96*  ALKPHOS  --  200*  BILITOT  --  0.4  GFRNONAA 19* 20*  GFRAA 22* 23*  ANIONGAP 8 11     Hematology Recent Labs Lab 06/07/17 1317 06/08/17 0157  WBC 5.5 5.6  RBC 3.46* 3.26*  HGB 9.6* 9.1*  HCT 30.1* 28.2*  MCV 87.0 86.5  MCH 27.7 27.9  MCHC 31.9 32.3  RDW 13.1 13.1  PLT 338 330  Cardiac Enzymes Recent Labs Lab 06/07/17 2057 06/08/17 0157  TROPONINI <0.03 <0.03    Recent Labs Lab 06/07/17 1337  TROPIPOC 0.00       Patient Profile     Latasha [name] is a 61 y.o. female w/ a history of poorly controlled IDDM, HTN, CKD3, and chronic pain presenting with shortness of breath, hypoxemia, and pulmonary edema.  Assessment & Plan    1. Acute on chronic diastolic dysfunction Continue IV diuresis as renal function allows Switch metoprolol to coreg 2 gram sodium diet,  Strict Is ans Os Daily weights  2. Hypertensive cardiovascular disease with CHF and renal failure Switch metoprolol to coreg  3. Morbid obesity Body mass index is 42.98 kg/m. Weight loss is advised  4. Severe LA enlargement At risk for afib Lifestyle modification including regular exercise and weight loss are advised Would consider outpatient sleep study also  5. Acute on chronic renal failure Continue diuresis If renal function improves, will need ACEi/ ARB long term Consider nephrology consultation   Thompson Grayer MD, [facility] 06/08/2017 12:08 PM

## 2017-06-08 NOTE — Progress Notes (Signed)
PROGRESS NOTE    Latasha [name]  SWN:[id number] DOB: 1956/03/21 DOA: 06/07/2017 PCP: Shirline Frees, MD   Brief Narrative:  Latasha [name] is an obese 61 y.o. female with medical history significant of HTN, HLD, Uncontrolled DM requiring Insulin, CKD Stage 3-4, BPPV, Obesity, and Hx of Palpatations who was recently admitted on 05/15/17 for HHS/HONK and subsequently discharged. On Discharge patient stopped taking her diuretics and states she has been drinking more and more water. Patient presented today with a CC of SOB and she states the SOB has gotten worse this last week. She also states that she has chronic leg swelling in the Left and that her swelling bilaterally has gotten worse. Patient states she becomes very dyspneic with exertion and even with ADLs (dressing) and states that she is unable to lie flat to sleep and has to prop herself up. No nausea, vomiting, or CP. Denied any lightheadedness. Denied any other complaints at this time. TRH was called to Admit for Heart Failure. Cardiology was consulted by EDP. Patient is improving and diuresing well.   Assessment & Plan:   Active Problems:   Insulin-requiring or dependent type II diabetes mellitus ([place])   OBESITY   Essential hypertension   Hyperlipemia   Palpitations   CKD (chronic kidney disease), stage III   Acute kidney injury ([facility])   Diastolic CHF ([facility])   Acute on chronic diastolic CHF (congestive heart failure) ([facility])   Acute respiratory failure with hypoxia ([facility])  Acute Respiratory Failure with Hypoxia 2/2 to Acute Diastolic CHF Exacerbation -Admitted Inpatient Telemetry -Symptoms consistent with CHF as patient had Worsening Leg Edema, SOB on Exertion, and Orthopnea; Patient also stopped Diuretics and had significant Water intake  -Maintain O2 Saturations >92% and Continue with Supplemental O2 via  -Continuous Pulse Oximetry an Wean O2 as Tolerated -Repeat CXR in AM -CXR showed evidence of CHF -C/w DuoNeb 3 mL q6hprn for  Wheezing/SOB -Will need Home O2 Screen prior to D/C with Ambulatory Pulse Ox  Acute Decompensations of Grade 1 Diastolic CHF -BNP was 938.1 -ECHO 05/15/17 showed The cavity size was normal. Wall thickness was increased in a pattern of moderate LVH. Systolic function was normal. The estimated ejection fraction was in the range of 60% to 65%. Wall motion was normal; there were no regional wallmotion abnormalities. Doppler parameters are consistent with abnormal left ventricular relaxation (grade 1 diastolic dysfunction). -Cycle Cardiac Troponin to r/o New Ischemia and have been Negative <0.03 x 2 -Strict I's/O's, Daily Weights, Fluid Restrict to 1200 mL and SLIV -Cardiology Consulted by EDP for further evaluation and recommendations ; Patient was supposed to follow up with Cardiology as an outpatient for Stress Test -Increased Diuresis of IV Lasix 40 mg BID to 60 mg IV BID; Given IV 60 mg in ED; Will Defer Further increase to Cardiology as Renal Function Allows -2 Gram Sodium Diet Recommended by Cardiology -Foley Catheter for Strict I's/O's; Patient is -0.757 Liter and refused Foley Catheter -Weight is down 3 lbs since yesterday  -Changed BB Metoprolol 25 mg po BID to Carvedilol 12.5 mg po BID; No ACE/ARB because of Renal Insuffiency   AKI on CKD Stage 3/4 -BUN/Cr on Admission was 39/2.63; Likely from Volume Overload and slightly improved to 36/2.53 -Foley Catheter not inserted because patient refused -Strict I's/O's -Obtained U/A to check for Protein to r/o Nephrotic Syndrome and showed 100 Protein on U/A -Patient never was able to Follow up with Nephrology as an outpatient -Recent Renal U/S showed Renal ultrasound 05/16/17 was  consistent with ? medical renal disease changes, negative for renal mass or hydronephrosis -Avoid Nephrotoxics if possible (on IV Lasix) and repeat CMP in AM -Will need Nephrology Consultation eventually -Per Cards if Renal Fxn improves, will need ACEi/ARB long Term.     Poorly Controlled Diabetes Mellitus Type 2 -C/w Insulin Glaragine 44 units BID -Added Moderate Novolog SSI -Held Patient's Home Trulicity and Metformin was D/C'd -Continue to Monitor CBG's; Ranging from 73-144 -Last HbA1c was 13.1; Repeat HbA1c this Visit was 11.5 but lab states was inaccurate so was re-ordered to ensure accurate result  HTN -Metoprolol 25 mg po BID changed to Carvedilol 12.5 mg po BID at the recommendation of Cardiology -C/w  Amlodipine 10 mg po Daily  -Added IV Hydralazine 10 mg q6hprn for SBP >180 or DBP >100  HLD -Lipid Panel showed Cholesterol of 116, HDL of 38, LDL of 55, TG of 114, VLDL of 23 -C/w Home Atorvastatin 40 mg po Daily   Hx of Palpitations -C/w Telemetry  -Cards Consulting and appreciate Input -Patient is at risk for A Fib -Cardiology recommending outpatient Sleep Study along with lifestyle modifications including Exercise, Weight Loss  Obesity -BMI 39.73 -Weight Loss Counseling Given -Dietician Consult  Bilateral Leg Edema -Likely from CHF  -Bilateral LE Duplex ordered and showed No evidence of DVT, superficial thrombosis, or Baker's Cyst.  Mildly Elevated ALT -Patient's ALT this AM was 96 -? Volume Overload -Continue to Monitor and Repeat CMP in AM -If continues to worsen will hold Statin    DVT prophylaxis: Heparin 5,000 units sq q8h Code Status: FULL CODE Family Communication: Discussed with Husband at bedside Disposition Plan: Remain Inpatient as patient is currently being diuresed  Consultants:   CARDIOLOGY   Procedures:  VENOUS DUPLEX    Antimicrobials: Anti-infectives    None     Subjective: Seen and examined at bedside and was feeling better. No Nausea or vomiting and states Breathing is improved. No CP or Pressure. No other concerns or complaints at this time.  Objective: Vitals:   06/07/17 1945 06/07/17 2024 06/07/17 2329 06/08/17 0649  BP: (!) 156/70 (!) 168/72 (!) 156/68 (!) 150/63  Pulse: 62 65  64 63  Resp: 15 18 18 18   Temp:  98.1 F (36.7 C) 97.8 F (36.6 C) 97.7 F (36.5 C)  TempSrc:  Oral Oral Oral  SpO2: 96% 95% 96% 94%  Weight:  116.8 kg (257 lb 8 oz)  115.3 kg (254 lb 4.8 oz)  Height:        Intake/Output Summary (Last 24 hours) at 06/08/17 0751 Last data filed at 06/08/17 0750  Gross per 24 hour  Intake                0 ml  Output             1000 ml  Net            -1000 ml   Filed Weights   06/07/17 1306 06/07/17 2024 06/08/17 0649  Weight: 106.6 kg (235 lb) 116.8 kg (257 lb 8 oz) 115.3 kg (254 lb 4.8 oz)   Examination: Physical Exam:  Constitutional: WN/WD obese female NAD and appears calm and comfortable Eyes: Lids and conjunctivae normal, sclerae anicteric  ENMT: External Ears, Nose appear normal. Grossly normal hearing. Mucous membranes are moist.  Neck: Appears normal, supple, no cervical masses, normal ROM, no appreciable thyromegaly Respiratory: Diminished to auscultation bilaterally at the bases, no wheezing, rales, rhonchi or crackles. Normal respiratory effort and patient  is not tachypenic. No accessory muscle use. Wearing supplemental O2 Cardiovascular: RRR, no murmurs / rubs / gallops. S1 and S2 auscultated. 1-2+ LE extremity edema.   Abdomen: Soft, non-tender, Distended due to body habitus. No masses palpated. No appreciable hepatosplenomegaly. Bowel sounds positive x4.  GU: Deferred. Musculoskeletal: No clubbing / cyanosis of digits/nails. No joint deformity upper and lower extremities. Good ROM, no contractures.  Skin: No rashes, ulcers on a limited skin eval. No induration; Warm and dry.  Neurologic: CN 2-12 grossly intact with no focal deficits. Strength 5/5 in all 4. Romberg sign cerebellar reflexes not assessed.  Psychiatric: Normal judgment and insight. Alert and oriented x 3. Normal mood and appropriate affect.   Data Reviewed: I have personally reviewed following labs and imaging studies  CBC:  Recent Labs Lab 06/07/17 1317  06/08/17 0157  WBC 5.5 5.6  NEUTROABS  --  2.9  HGB 9.6* 9.1*  HCT 30.1* 28.2*  MCV 87.0 86.5  PLT 338 834   Basic Metabolic Panel:  Recent Labs Lab 06/07/17 1317 06/08/17 0157  NA 137 141  K 4.5 4.1  CL 104 106  CO2 25 24  GLUCOSE 207* 168*  BUN 39* 36*  CREATININE 2.63* 2.53*  CALCIUM 8.2* 8.1*  MG  --  2.5*  PHOS  --  5.6*   GFR: Estimated Creatinine Clearance: 29.8 mL/min (A) (by C-G formula based on SCr of 2.53 mg/dL (H)). Liver Function Tests:  Recent Labs Lab 06/08/17 0157  AST 39  ALT 96*  ALKPHOS 200*  BILITOT 0.4  PROT 6.3*  ALBUMIN 2.9*   No results for input(s): LIPASE, AMYLASE in the last 168 hours. No results for input(s): AMMONIA in the last 168 hours. Coagulation Profile: No results for input(s): INR, PROTIME in the last 168 hours. Cardiac Enzymes:  Recent Labs Lab 06/07/17 2057 06/08/17 0157  TROPONINI <0.03 <0.03   BNP (last 3 results) No results for input(s): PROBNP in the last 8760 hours. HbA1C:  Recent Labs  06/08/17 0157  HGBA1C 11.5*   CBG:  Recent Labs Lab 06/07/17 2318 06/08/17 0723  GLUCAP 144* 73   Lipid Profile:  Recent Labs  06/08/17 0157  CHOL 116  HDL 38*  LDLCALC 55  TRIG 114  CHOLHDL 3.1   Thyroid Function Tests: No results for input(s): TSH, T4TOTAL, FREET4, T3FREE, THYROIDAB in the last 72 hours. Anemia Panel: No results for input(s): VITAMINB12, FOLATE, FERRITIN, TIBC, IRON, RETICCTPCT in the last 72 hours. Sepsis Labs: No results for input(s): PROCALCITON, LATICACIDVEN in the last 168 hours.  No results found for this or any previous visit (from the past 240 hour(s)).   Radiology Studies: Dg Chest 2 View  Result Date: 06/07/2017 CLINICAL DATA:  Shortness of breath EXAM: CHEST  2 VIEW COMPARISON:  05/14/2017, 04/27/2015 FINDINGS: Cardiomegaly evident with increased diffuse interstitial opacities compatible with edema. Minor associated bibasilar atelectasis. Trace pleural effusions. Pattern  compatible with mild CHF. No pneumothorax. Trachea is midline. Degenerative changes noted of the spine. IMPRESSION: Mild CHF pattern . Electronically Signed   By: Jerilynn Mages.  Shick M.D.   On: 06/07/2017 13:41   Scheduled Meds: . amLODipine  10 mg Oral Daily  . atorvastatin  40 mg Oral Daily  . enoxaparin (LOVENOX) injection  30 mg Subcutaneous Q24H  . furosemide  60 mg Intravenous BID  . gabapentin  100 mg Oral TID  . insulin aspart  0-15 Units Subcutaneous TID WC  . insulin glargine  44 Units Subcutaneous BID  . metoprolol  tartrate  25 mg Oral BID  . pneumococcal 23 valent vaccine  0.5 mL Intramuscular Tomorrow-1000  . potassium chloride  40 mEq Oral Daily  . sodium chloride flush  3 mL Intravenous Q12H   Continuous Infusions: . sodium chloride      LOS: 1 day   Kerney Elbe, DO [facility] Pager [phone]  If 7PM-7AM, please contact night-coverage [email or web address] Password [email or web address] 06/08/2017, 7:51 AM

## 2017-06-08 NOTE — Progress Notes (Signed)
VASCULAR LAB PRELIMINARY  PRELIMINARY  PRELIMINARY  PRELIMINARY  Bilateral lower extremity venous duplex completed.    Preliminary report:  Bilateral:  No evidence of DVT, superficial thrombosis, or Baker's Cyst.   Tecia Cinnamon, RVS 06/08/2017, 9:18 AM

## 2017-06-08 NOTE — Evaluation (Signed)
Physical Therapy Evaluation Patient Details Name: Latasha [name] MRN: [id number] DOB: [birth year] Today's Date: 06/08/2017   History of Present Illness  Patient is a 61 yo female admitted on 06/07/17 with SOB and LE edema - stopped taking Lasix.   Patient with acute resp failure with hypoxia due to CHF exacerbation, and HTN.    PMH:  HTN, HLD, uncontrolled DM, CKD, vertigo, obesity.  Clinical Impression  Patient presents with problems listed below.  Will benefit from acute PT to maximize functional mobility prior to return home with husband.  Functioning at min guard with mobility and gait.  Fairly good balance during gait.  Will need to try stairs at next session.   Do not anticipate patient will need f/u PT at d/c.    Follow Up Recommendations No PT follow up;Supervision - Intermittent    Equipment Recommendations  None recommended by PT    Recommendations for Other Services       Precautions / Restrictions Precautions Precautions: None Restrictions Weight Bearing Restrictions: No      Mobility  Bed Mobility Overal bed mobility: Modified Independent             General bed mobility comments: Increased time  Transfers Overall transfer level: Needs assistance Equipment used: None Transfers: Sit to/from Stand Sit to Stand: Min guard         General transfer comment: Assist for safety  Ambulation/Gait Ambulation/Gait assistance: Min guard Ambulation Distance (Feet): 60 Feet Assistive device: None Gait Pattern/deviations: Step-through pattern;Decreased stride length;Shuffle;Trunk flexed Gait velocity: decreased Gait velocity interpretation: Below normal speed for age/gender General Gait Details: Patient with slow, steady gait.  No loss of balance.  Assist for safety only.  Stairs            Wheelchair Mobility    Modified Rankin (Stroke Patients Only)       Balance Overall balance assessment: Needs assistance Sitting-balance support: No upper  extremity supported;Feet supported Sitting balance-Leahy Scale: Fair     Standing balance support: No upper extremity supported;During functional activity Standing balance-Leahy Scale: Good Standing balance comment: Able to maintain static balance with no assist/support.  Able to shift weight during ambulation with no loss of balance during gait.                             Pertinent Vitals/Pain Pain Assessment: No/denies pain    Home Living Family/patient expects to be discharged to:: Private residence Living Arrangements: Spouse/significant other Available Help at Discharge: Family;Available PRN/intermittently (Husband works long shifts 4 days a week) Type of Home: House Home Access: Stairs to enter Entrance Stairs-Rails: Psychiatric nurse of Steps: 2 Home Layout: One level Home Equipment: Environmental consultant - 4 wheels;Shower seat;Hand held shower head;Cane - single point      Prior Function Level of Independence: Independent with assistive device(s);Needs assistance   Gait / Transfers Assistance Needed: Patient reports she furniture walks at home, and uses cane/RW in community.  ADL's / Homemaking Assistance Needed: Pt reports with decreased vision in Rt eye, she had difficulty cooking.  Also difficulty with LB dressing.  Husband assists.  Comments: Patient does not drive due to vision.     Hand Dominance   Dominant Hand: Left    Extremity/Trunk Assessment   Upper Extremity Assessment Upper Extremity Assessment: Defer to OT evaluation    Lower Extremity Assessment Lower Extremity Assessment: Generalized weakness;LLE deficits/detail LLE Deficits / Details: Patient reports LLE feels heavy and painful.  Noted patient with numbness in Lt foot.  Unable to perform eversion of Lt ankle/foot.  When attempting DF, foot moves into inversion.Marland Kitchen LLE Sensation: decreased light touch (foot)    Cervical / Trunk Assessment Cervical / Trunk Assessment: Other  exceptions Cervical / Trunk Exceptions: Increased body habitus  Communication   Communication: No difficulties  Cognition Arousal/Alertness: Awake/alert Behavior During Therapy: WFL for tasks assessed/performed Overall Cognitive Status: Within Functional Limits for tasks assessed                                        General Comments General comments (skin integrity, edema, etc.): Pt husband present throughout session and had questions about applying for medicaid. Notified RN on pt's questions about insurance. Pt SpO2 dropped to 85 on room air after activity; placed pt back on 2L O2 and SpO2 returned to 90s    Exercises     Assessment/Plan    PT Assessment Patient needs continued PT services  PT Problem List Decreased strength;Decreased activity tolerance;Decreased mobility;Decreased balance;Cardiopulmonary status limiting activity;Impaired sensation;Obesity       PT Treatment Interventions Gait training;DME instruction;Stair training;Functional mobility training;Therapeutic activities;Therapeutic exercise;Patient/family education    PT Goals (Current goals can be found in the Care Plan section)  Acute Rehab PT Goals Patient Stated Goal: Get home soon PT Goal Formulation: With patient Time For Goal Achievement: 06/15/17 Potential to Achieve Goals: Good    Frequency Min 3X/week   Barriers to discharge Decreased caregiver support Family available prn.    Co-evaluation               AM-PAC PT "6 Clicks" Daily Activity  Outcome Measure Difficulty turning over in bed (including adjusting bedclothes, sheets and blankets)?: None Difficulty moving from lying on back to sitting on the side of the bed? : None Difficulty sitting down on and standing up from a chair with arms (e.g., wheelchair, bedside commode, etc,.)?: None Help needed moving to and from a bed to chair (including a wheelchair)?: None Help needed walking in hospital room?: None Help needed  climbing 3-5 steps with a railing? : A Little 6 Click Score: 23    End of Session Equipment Utilized During Treatment: Gait belt;Oxygen Activity Tolerance: Patient tolerated treatment well Patient left: in chair;with call bell/phone within reach Nurse Communication: Mobility status PT Visit Diagnosis: Other abnormalities of gait and mobility (R26.89);Muscle weakness (generalized) (M62.81)    Time: 3614-4315 PT Time Calculation (min) (ACUTE ONLY): 31 min   Charges:   PT Evaluation $PT Eval Moderate Complexity: 1 Mod PT Treatments $Gait Training: 8-22 mins   PT G Codes:        Carita Pian. Sanjuana Kava, [facility] Acute Rehab Services Pager [phone]   Despina Pole 06/08/2017, 6:09 PM

## 2017-06-08 NOTE — Evaluation (Signed)
Occupational Therapy Evaluation Patient Details Name: Latasha [name] MRN: [id number] DOB: [birth year] Today's Date: 06/08/2017    History of Present Illness 61 y.o.femalewith medical history significant of HTN, HLD, Uncontrolled DM requiring Insulin, CKD Stage 3-4, BPPV, Obesity, and Hx of Palpatations who was recently admitted on 05/15/17 for HHS/HONK and subsequently discharged. On Discharge patient stopped taking her diuretics and states she has been drinking more and more water. Patient presented on 06/07/17 with a CC of SOB and she states the SOB has gotten worse this last week. She also states that she has chronic leg swelling in the Left and that her swelling bilaterally has gotten worse.   Clinical Impression   PTA, pt was living with her husband and was performing her ADLs with some assistance for LB ADLs. Pt currently performing LB ADLs with Min A and functional mobility with Min Guard A and RW. Pt presenting with decreased activity tolerance and endurance. Pt would benefit from further acute OT to address LB ADLs with AE and energy conservation to increase pt safety and independence. Recommend dc home once medically stable per phsyician.     Follow Up Recommendations  No OT follow up;Supervision - Intermittent    Equipment Recommendations  None recommended by OT    Recommendations for Other Services       Precautions / Restrictions        Mobility Bed Mobility Overal bed mobility: Modified Independent             General bed mobility comments: HOB elevated and increased time  Transfers Overall transfer level: Needs assistance Equipment used: None Transfers: Sit to/from Stand Sit to Stand: Min guard         General transfer comment: Min Guard A for safety    Balance Overall balance assessment: Needs assistance Sitting-balance support: No upper extremity supported;Feet supported Sitting balance-Leahy Scale: Fair     Standing balance support: No upper  extremity supported;During functional activity Standing balance-Leahy Scale: Fair Standing balance comment: Able to maintain static standing with UE support                           ADL either performed or assessed with clinical judgement   ADL Overall ADL's : Needs assistance/impaired Eating/Feeding: Set up;Sitting   Grooming: Wash/dry face;Min guard;Standing   Upper Body Bathing: Set up;Sitting   Lower Body Bathing: Minimal assistance;Sit to/from stand   Upper Body Dressing : Set up;Sitting   Lower Body Dressing: Minimal assistance;Sit to/from stand Lower Body Dressing Details (indicate cue type and reason): Pt able to sit at EOB and adjust socks. However, has a difficult time woudl benefit from AE.  Toilet Transfer: Min guard;Regular Toilet;Grab bars;Ambulation Toilet Transfer Details (indicate cue type and reason): Min Guard A for safety. Pt using grab bars to stabilize.  Toileting- Water quality scientist and Hygiene: Min guard;Sit to/from stand       Functional mobility during ADLs: Min guard;Rolling walker (without RW pt will furnature walk) General ADL Comments: Pt presenting wiht decreased activity tolernace and endurance. Pt with difficulty performing LB ADLs and owuld benefit from education on AE to increase independence.     Vision         Perception     Praxis      Pertinent Vitals/Pain Pain Assessment: No/denies pain     Hand Dominance Right   Extremity/Trunk Assessment     Lower Extremity Assessment Lower Extremity Assessment: LLE deficits/detail LLE Deficits /  Details: "This leg has been giving me a hard time. Its gets heavy. It feels heavier than the r leg."   Cervical / Trunk Assessment Cervical / Trunk Assessment: Other exceptions Cervical / Trunk Exceptions: Increased body habitus   Communication Communication Communication: No difficulties   Cognition Arousal/Alertness: Awake/alert Behavior During Therapy: WFL for tasks  assessed/performed Overall Cognitive Status: Within Functional Limits for tasks assessed                                     General Comments  Pt husband present throughout session and had questions about applying for medicaid. Notified RN on pt's questions about insurance. Pt SpO2 dropped to 85 on room air after activity; placed pt back on 2L O2 and SpO2 returned to 90s    Exercises     Shoulder Instructions      Home Living Family/patient expects to be discharged to:: Private residence Living Arrangements: Spouse/significant other Available Help at Discharge: Family;Available PRN/intermittently (Husband works long shifts 4 days a week) Type of Home: House Home Access: Stairs to enter [employer] of Steps: 1   Home Layout: One level     Bathroom Shower/Tub: Teacher, early years/pre: Standard     Home Equipment: Environmental consultant - 4 wheels;Shower seat;Hand held shower head          Prior Functioning/Environment Level of Independence: Needs assistance  Gait / Transfers Assistance Needed: Using rollator for functional mobility ADL's / Homemaking Assistance Needed: Pt reports that she has difficulty with LB clothing and husband assists            OT Problem List: Decreased range of motion;Decreased activity tolerance;Impaired balance (sitting and/or standing);Decreased safety awareness;Decreased knowledge of use of DME or AE;Decreased knowledge of precautions;Pain;Obesity      OT Treatment/Interventions: Self-care/ADL training;Therapeutic exercise;Energy conservation;DME and/or AE instruction;Therapeutic activities;Patient/family education    OT Goals(Current goals can be found in the care plan section) Acute Rehab OT Goals Patient Stated Goal: "Get the fluid off my legs." OT Goal Formulation: With patient Time For Goal Achievement: 06/22/17 Potential to Achieve Goals: Good ADL Goals Pt Will Perform Lower Body Bathing: with min guard  assist;sit to/from stand;with adaptive equipment Pt Will Perform Lower Body Dressing: with min guard assist;sit to/from stand;with adaptive equipment Pt Will Transfer to Toilet: regular height toilet;ambulating;with modified independence Additional ADL Goal #1: Pt will verbalize three energy conservation techniques with Min VCs  OT Frequency: Min 2X/week   Barriers to D/C:            Co-evaluation              AM-PAC PT "6 Clicks" Daily Activity     Outcome Measure Help from another person eating meals?: None Help from another person taking care of personal grooming?: A Little Help from another person toileting, which includes using toliet, bedpan, or urinal?: A Little Help from another person bathing (including washing, rinsing, drying)?: A Little Help from another person to put on and taking off regular upper body clothing?: A Little Help from another person to put on and taking off regular lower body clothing?: A Little 6 Click Score: 19   End of Session Equipment Utilized During Treatment: Rolling walker;Oxygen Nurse Communication: Mobility status;Other (comment) (Questions about insurance)  Activity Tolerance: Patient tolerated treatment well Patient left: in bed;with call bell/phone within reach;with family/visitor present  OT Visit Diagnosis: Unsteadiness on feet (R26.81);Other  abnormalities of gait and mobility (R26.89);Muscle weakness (generalized) (M62.81)                Time: 4436-0165 OT Time Calculation (min): 17 min Charges:  OT General Charges $OT Visit: 1 Visit OT Evaluation $OT Eval Low Complexity: 1 Low G-Codes:     Raekwon Winkowski MSOT, OTR/L Acute Rehab Pager: [phone] Office: [place] 06/08/2017, 4:54 PM

## 2017-06-09 ENCOUNTER — Inpatient Hospital Stay (HOSPITAL_COMMUNITY): Payer: Medicaid Other

## 2017-06-09 DIAGNOSIS — J9601 Acute respiratory failure with hypoxia: Secondary | ICD-10-CM

## 2017-06-09 DIAGNOSIS — I110 Hypertensive heart disease with heart failure: Secondary | ICD-10-CM

## 2017-06-09 DIAGNOSIS — R002 Palpitations: Secondary | ICD-10-CM

## 2017-06-09 DIAGNOSIS — I10 Essential (primary) hypertension: Secondary | ICD-10-CM

## 2017-06-09 DIAGNOSIS — R0602 Shortness of breath: Secondary | ICD-10-CM

## 2017-06-09 LAB — CBC WITH DIFFERENTIAL/PLATELET
Basophils Absolute: 0 10*3/uL (ref 0.0–0.1)
Basophils Relative: 0 %
EOS ABS: 0.1 10*3/uL (ref 0.0–0.7)
EOS PCT: 3 %
HCT: 28.2 % — ABNORMAL LOW (ref 36.0–46.0)
Hemoglobin: 8.9 g/dL — ABNORMAL LOW (ref 12.0–15.0)
LYMPHS ABS: 2.5 10*3/uL (ref 0.7–4.0)
LYMPHS PCT: 44 %
MCH: 27.8 pg (ref 26.0–34.0)
MCHC: 31.6 g/dL (ref 30.0–36.0)
MCV: 88.1 fL (ref 78.0–100.0)
MONO ABS: 0.4 10*3/uL (ref 0.1–1.0)
Monocytes Relative: 8 %
Neutro Abs: 2.5 10*3/uL (ref 1.7–7.7)
Neutrophils Relative %: 45 %
PLATELETS: 340 10*3/uL (ref 150–400)
RBC: 3.2 MIL/uL — ABNORMAL LOW (ref 3.87–5.11)
RDW: 13.2 % (ref 11.5–15.5)
WBC: 5.5 10*3/uL (ref 4.0–10.5)

## 2017-06-09 LAB — COMPREHENSIVE METABOLIC PANEL
ALT: 74 U/L — ABNORMAL HIGH (ref 14–54)
ANION GAP: 8 (ref 5–15)
AST: 33 U/L (ref 15–41)
Albumin: 2.8 g/dL — ABNORMAL LOW (ref 3.5–5.0)
Alkaline Phosphatase: 196 U/L — ABNORMAL HIGH (ref 38–126)
BUN: 37 mg/dL — ABNORMAL HIGH (ref 6–20)
CHLORIDE: 106 mmol/L (ref 101–111)
CO2: 27 mmol/L (ref 22–32)
CREATININE: 2.52 mg/dL — AB (ref 0.44–1.00)
Calcium: 8.1 mg/dL — ABNORMAL LOW (ref 8.9–10.3)
GFR, EST AFRICAN AMERICAN: 23 mL/min — AB (ref >60)
GFR, EST NON AFRICAN AMERICAN: 20 mL/min — AB (ref >60)
Glucose, Bld: 174 mg/dL — ABNORMAL HIGH (ref 65–99)
POTASSIUM: 3.9 mmol/L (ref 3.5–5.1)
SODIUM: 141 mmol/L (ref 135–145)
Total Bilirubin: 0.4 mg/dL (ref 0.3–1.2)
Total Protein: 6.3 g/dL — ABNORMAL LOW (ref 6.5–8.1)

## 2017-06-09 LAB — MAGNESIUM: MAGNESIUM: 2.3 mg/dL (ref 1.7–2.4)

## 2017-06-09 LAB — PHOSPHORUS: PHOSPHORUS: 5 mg/dL — AB (ref 2.5–4.6)

## 2017-06-09 LAB — GLUCOSE, CAPILLARY
GLUCOSE-CAPILLARY: 66 mg/dL (ref 65–99)
GLUCOSE-CAPILLARY: 109 mg/dL — AB (ref 65–99)
GLUCOSE-CAPILLARY: 116 mg/dL — AB (ref 65–99)
GLUCOSE-CAPILLARY: 160 mg/dL — AB (ref 65–99)
Glucose-Capillary: 142 mg/dL — ABNORMAL HIGH (ref 65–99)

## 2017-06-09 MED ORDER — INSULIN GLARGINE 100 UNIT/ML ~~LOC~~ SOLN
35.0000 [IU] | Freq: Two times a day (BID) | SUBCUTANEOUS | Status: DC
  Administered 2017-06-09 – 2017-06-10 (×2): 35 [IU] via SUBCUTANEOUS
  Filled 2017-06-09 (×4): qty 0.35

## 2017-06-09 NOTE — Progress Notes (Signed)
Financial Counselor to see patient to determine what the patient might qualify for financially; Aneta Mins [phone]

## 2017-06-09 NOTE — Progress Notes (Signed)
Occupational Therapy Treatment Patient Details Name: Latasha [name] MRN: [id number] DOB: January 03, 1956 Today's Date: 06/09/2017    History of present illness Patient is a 61 yo female admitted on 06/07/17 with SOB and LE edema - stopped taking Lasix.   Patient with acute resp failure with hypoxia due to CHF exacerbation, and HTN.    PMH:  HTN, HLD, uncontrolled DM, CKD, vertigo, obesity.   OT comments  Pt progressing towards established goals. Issued AE and provided education for LB ADLs to increase safety, independence, and energy conservation. Pt demonstrating understanding and donned LB clothes with set up A and VCs; Min A for donning slippers. Provided education and handout on [facility] and reviewed handout thoroughly; pt verbalizing understanding. Will continues to follow to facilitate safe dc and increase pt endurance, activity tolerance, and safety with ADLs.  Continue to recommend dc home once medically stable.    Follow Up Recommendations  No OT follow up;Supervision - Intermittent    Equipment Recommendations  None recommended by OT    Recommendations for Other Services      Precautions / Restrictions Precautions Precautions: None Restrictions Weight Bearing Restrictions: No       Mobility Bed Mobility Overal bed mobility: Modified Independent             General bed mobility comments: Increased time  Transfers Overall transfer level: Needs assistance Equipment used: None Transfers: Sit to/from Stand Sit to Stand: Min guard         General transfer comment: Assist for safety    Balance Overall balance assessment: Needs assistance Sitting-balance support: No upper extremity supported;Feet supported Sitting balance-Leahy Scale: Good     Standing balance support: No upper extremity supported;During functional activity Standing balance-Leahy Scale: Fair Standing balance comment: Pt a little unsteady this morning. Possibly due to low blood sugar                           ADL either performed or assessed with clinical judgement   ADL Overall ADL's : Needs assistance/impaired               Lower Body Bathing Details (indicate cue type and reason): Educated pt on AE for LB bathing     Lower Body Dressing: Sit to/from stand;Set up;Supervision/safety;With adaptive equipment (Min A for shoe horn with bedroom slippers) Lower Body Dressing Details (indicate cue type and reason): Issued AE for LB dressing. Pt very grateful. Provided education and pt donning underwear, socks, and bedroom slippers using AE. Pt required set up and educational VCs; Min A for using shoe horn with slippers.              Functional mobility during ADLs: Min guard;Rolling walker (without RW pt will furnature walk) General ADL Comments: Pt presenting wiht decreased activity tolernace and endurance. Pt with difficulty performing LB ADLs and owuld benefit from education on AE to increase independence.     Vision Baseline Vision/History: Cataracts (Had cataract surgery; Currently has low vision deficits) Patient Visual Report: No change from baseline;Other (comment);Blurring of vision (Pt with low vision; unable to see normal print and faces) Additional Comments: Educated pt on using buffy fabic paint on clothing to determine whether something is front/back or inside out. Pt would benefit from further low vision compensatory techniques. Pt uses a Neurosurgeon at home for reading.   Perception     Praxis      Cognition Arousal/Alertness: Lethargic Behavior During Therapy: WFL for tasks  assessed/performed Overall Cognitive Status: Within Functional Limits for tasks assessed                                 General Comments: Pt with low blood sugar this morning. She reports that she doesn't feel herself and feel groggy.         Exercises     Shoulder Instructions       General Comments      Pertinent Vitals/ Pain       Pain Assessment: No/denies  pain  Home Living                                          Prior Functioning/Environment              Frequency  Min 2X/week        Progress Toward Goals  OT Goals(current goals can now be found in the care plan section)  Progress towards OT goals: Progressing toward goals  Acute Rehab OT Goals Patient Stated Goal: Get home soon OT Goal Formulation: With patient Time For Goal Achievement: 06/22/17 Potential to Achieve Goals: Good ADL Goals Pt Will Perform Lower Body Bathing: with min guard assist;sit to/from stand;with adaptive equipment Pt Will Perform Lower Body Dressing: with min guard assist;sit to/from stand;with adaptive equipment Pt Will Transfer to Toilet: regular height toilet;ambulating;with modified independence Additional ADL Goal #1: Pt will verbalize three energy conservation techniques with Min VCs  Plan Discharge plan remains appropriate    Co-evaluation                 AM-PAC PT "6 Clicks" Daily Activity     Outcome Measure   Help from another person eating meals?: None Help from another person taking care of personal grooming?: A Little Help from another person toileting, which includes using toliet, bedpan, or urinal?: A Little Help from another person bathing (including washing, rinsing, drying)?: A Little Help from another person to put on and taking off regular upper body clothing?: A Little Help from another person to put on and taking off regular lower body clothing?: A Little 6 Click Score: 19    End of Session Equipment Utilized During Treatment: Other (comment) (AE)  OT Visit Diagnosis: Unsteadiness on feet (R26.81);Other abnormalities of gait and mobility (R26.89);Muscle weakness (generalized) (M62.81)   Activity Tolerance Patient tolerated treatment well   Patient Left in bed;with call bell/phone within reach;with family/visitor present (at [facility])   Nurse Communication Mobility status;Other (comment) (Pt  feeling poorly and groggy)        Time: 6812-7517 OT Time Calculation (min): 24 min  Charges: OT General Charges $OT Visit: 1 Visit OT Treatments $Self Care/Home Management : 23-37 mins  [place], OTR/L Acute Rehab Pager: [phone] Office: [place] 06/09/2017, 8:34 AM

## 2017-06-09 NOTE — Progress Notes (Signed)
Progress Note  Patient Name: Latasha [name] Date of Encounter: 06/09/2017  Primary Cardiologist: Henrietta Hoover   Patient Profile     61 y.o. female with poorly controlled DM-2 on Insulin (recent admission for [facility]ONK), HTN, CKD-3 (with Acute on Chronic Failure), chronic pain with Echo suggesting ~Gr 1 DD (but has mod-severe LA dilation suggesting higher grade DD).   She was admitted < 1 month after d/c from [place] admit - d/c was not on BP meds or diuretics 2/2 ARF. Now admitted with slightly worse renal function, but edematous with CXR suggesting pulmonary edema (but BNP only 141). Was started on IV diuretics -(unfortunately I/Os not well recorded & weights are in accurate)  Subjective   Feels much better.  Inpatient Medications    Scheduled Meds: . amLODipine  10 mg Oral Daily  . atorvastatin  40 mg Oral Daily  . carvedilol  12.5 mg Oral BID WC  . furosemide  60 mg Intravenous BID  . gabapentin  100 mg Oral TID  . heparin subcutaneous  5,000 Units Subcutaneous Q8H  . insulin aspart  0-15 Units Subcutaneous TID WC  . insulin glargine  44 Units Subcutaneous BID  . pneumococcal 23 valent vaccine  0.5 mL Intramuscular Tomorrow-1000  . potassium chloride  40 mEq Oral Daily  . sodium chloride flush  3 mL Intravenous Q12H   Continuous Infusions: . sodium chloride     PRN Meds: sodium chloride, acetaminophen, hydrALAZINE, ipratropium-albuterol, ondansetron (ZOFRAN) IV, oxyCODONE, sodium chloride flush   Vital Signs    Vitals:   06/08/17 1100 06/08/17 2207 06/08/17 2351 06/09/17 0500  BP: (!) 146/63 (!) 146/59 137/66 121/60  Pulse: 64 69 67 63  Resp: 20 20 18 18   Temp: 98.3 F (36.8 C) 98.1 F (36.7 C)  98.2 F (36.8 C)  TempSrc: Oral Oral  Oral  SpO2: 97% 97% 97% 99%  Weight:    252 lb 12.8 oz (114.7 kg)  Height:        Intake/Output Summary (Last 24 hours) at 06/09/17 1018 Last data filed at 06/09/17 0853  Gross per 24 hour  Intake             1183 ml  Output              1851 ml  Net             -668 ml   Filed Weights   06/07/17 2024 06/08/17 0649 06/09/17 0500  Weight: 257 lb 8 oz (116.8 kg) 254 lb 4.8 oz (115.3 kg) 252 lb 12.8 oz (114.7 kg)    Telemetry    NSR 60 s-70 s (rare ectopy,PVC PAC) - Personally Reviewed  ECG    n/a - Personally Reviewed  Physical Exam   Physical Exam  Constitutional: She is oriented to person, place, and time. She appears well-developed and well-nourished.  Morbidly obese  HENT:  Head: Normocephalic and atraumatic.  Neck: No hepatojugular reflux and no JVD present. Carotid bruit is not present.  Cardiovascular: Normal rate, regular rhythm, normal heart sounds and intact distal pulses.  Exam reveals no gallop and no friction rub.   No murmur heard. Pulmonary/Chest: Effort normal. No respiratory distress. She has no wheezes. She has rales (minimal bibasal).  Abdominal: Soft. Bowel sounds are normal. She exhibits no distension. There is no tenderness. There is no rebound.  Musculoskeletal: Normal range of motion. She exhibits edema (1-2+ bilateral LE).  Neurological: She is alert and oriented to person, place, and  time.  Skin: Skin is warm and dry.  Psychiatric: She has a normal mood and affect. Her behavior is normal. Judgment and thought content normal.  Vitals reviewed.   Labs    Chemistry  Recent Labs Lab 06/07/17 1317 06/08/17 0157 06/09/17 0330  NA 137 141 141  K 4.5 4.1 3.9  CL 104 106 106  CO2 25 24 27   GLUCOSE 207* 168* 174*  BUN 39* 36* 37*  CREATININE 2.63* 2.53* 2.52*  CALCIUM 8.2* 8.1* 8.1*  PROT  --  6.3* 6.3*  ALBUMIN  --  2.9* 2.8*  AST  --  39 33  ALT  --  96* 74*  ALKPHOS  --  200* 196*  BILITOT  --  0.4 0.4  GFRNONAA 19* 20* 20*  GFRAA 22* 23* 23*  ANIONGAP 8 11 8      Hematology  Recent Labs Lab 06/07/17 1317 06/08/17 0157 06/09/17 0330  WBC 5.5 5.6 5.5  RBC 3.46* 3.26* 3.20*  HGB 9.6* 9.1* 8.9*  HCT 30.1* 28.2* 28.2*  MCV 87.0 86.5 88.1  MCH 27.7 27.9  27.8  MCHC 31.9 32.3 31.6  RDW 13.1 13.1 13.2  PLT 338 330 340    Cardiac Enzymes  Recent Labs Lab 06/07/17 2057 06/08/17 0157 06/08/17 1058  TROPONINI <0.03 <0.03 <0.03     Recent Labs Lab 06/07/17 1337  TROPIPOC 0.00     BNP  Recent Labs Lab 06/07/17 1317  BNP 141.0*     DDimer No results for input(s): DDIMER in the last 168 hours.   Radiology    Dg Chest 1 View  Result Date: 06/09/2017 CLINICAL DATA:  SOB/ Wheeze/ congestion EXAM: CHEST 1 VIEW COMPARISON:  06/07/2017 FINDINGS: Mild enlargement of the cardiopericardial silhouette. Indistinct bibasilar airspace opacities. The interstitial accentuation shown on the prior exam is mildly improved although there still indistinctness of pulmonary vasculature. Atherosclerotic calcification of the aortic arch. IMPRESSION: 1. Nonspecific bibasilar airspace opacities, possibilities include atelectasis, pneumonia, aspiration pneumonitis, or basilar distribution of resolving edema. 2. Improved interstitial edema but continued pulmonary venous hypertension. Mild enlargement of the cardiopericardial silhouette. 3.  Aortic Atherosclerosis (ICD10-I70.0). Electronically Signed   By: Van Clines M.D.   On: 06/09/2017 09:45   Dg Chest 2 View  Result Date: 06/07/2017 CLINICAL DATA:  Shortness of breath EXAM: CHEST  2 VIEW COMPARISON:  05/14/2017, 04/27/2015 FINDINGS: Cardiomegaly evident with increased diffuse interstitial opacities compatible with edema. Minor associated bibasilar atelectasis. Trace pleural effusions. Pattern compatible with mild CHF. No pneumothorax. Trachea is midline. Degenerative changes noted of the spine. IMPRESSION: Mild CHF pattern . Electronically Signed   By: Jerilynn Mages.  Shick M.D.   On: 06/07/2017 13:41    Cardiac Studies    2DEcho (during last hospitalization 05/15/17): Mod LVH. EF 60-65%, Gr 1 DD. Mild MR. Mod-Sever LA dilation (which would insinuate > Gr 1 DD).  Peak PA Pressure ~46 mmHg (mod Pulm HTN). Small  pericardial effution  LE Venous US: No DVT or superficial thrombus. No Baker's cyst.   Assessment & Plan  Principal Problem:   Acute respiratory failure with hypoxia (HCC) Active Problems:   Insulin-requiring or dependent type II diabetes mellitus ([place])   Hypertensive heart disease with acute on chronic diastolic congestive heart failure ([place])   Morbid obesity ([place])   Essential hypertension   Hyperlipemia   Palpitations   CKD (chronic kidney disease), stage III   Acute kidney injury superimposed on chronic kidney disease (HCC)   Elevated ALT measurement    Principal Problem:  Acute respiratory failure with hypoxia ([place]) - with pulmonary edema, likely consistent with A on C DHF as a result of poor BP control & cessation of diuretics.  Active Problems:   Insulin-requiring or dependent type II diabetes mellitus ([place]) - TRH to manage.    Essential hypertension (poorly controlled) - Hypertensive heart disease with acute on chronic diastolic congestive heart failure ([place])   Unfortunately I/Os & weights are not helpful - would continue IV diuretic x at least one more day (consider converting to PO ~80 mg AM, 40 mg PM tomorrow)  Reduced sodium diet here, but needs dietary counseling / chf education (consult CHF educator)  BP better today on Carvedilol (changed from metoprolol).  Continue amlodipine  Once renal function stabilizes - would restart ACE-I/ARB.  Major issue for her as OP was getting her prescribed meds.    Morbid obesity ([place]) Body mass index is 42.98 kg/m.  wgt loss advised; consider OP sleep study Eval for OSA.      Palpitations - previously evaluated, no sign of arrhythmia, on BB  Has LA dilation on Echo & obesity which does put her @ risk for Afib - recommend weight loss.   consider OP sleep study Eval for OSA.    Acute kidney injury superimposed on chronic kidney disease ([place]) - renal function continues to be stable  Would like to re-initiate ARB/ACE-I  once function stabilizes for both CV & renal protection in setting of DM-2 on insulin.  Was supposed to see Nephrology as OP - may benefit from inpatient consult to expedite follow-up.    Hyperlipemia - on statin   Elevated ALT measurement - ? Related to congestive hepatopathy   Signed, Glenetta Hew, MD  06/09/2017, 10:18 AM

## 2017-06-09 NOTE — Consult Note (Signed)
Latasha [name] is an obese 61 y.o. female with medical history significant of HTN, HLD, longstanding DM w/ retinopathy and peripheral neuropathy requiring , CKD Stage 3-4, Obesity, and Hx of Palpatations who was recently admitted on 05/15/17 for confusion and BS>500.  She reports recent increased SOB, she thinks due to the diuretics not working which she tells me she was taking faithfully PTA. In the ED on 9/1 CXR was c/w CHF. Creat was 2.53m/dl c/w 2.25 on 05/01/17.  UA in past shows persistent proteinuria and renal [place]18 revealed a 13.4cm left and 14.9cm right kidney.  Echo 05/15/17 revealed good LV fct, PA peak press of 46, severe dilatation of LA.   Past Medical History:  Diagnosis Date  . BPPV (benign paroxysmal positional vertigo)   . Chronic congestive heart failure with [place] ventricular diastolic dysfunction [place]   . Diabetes mellitus   . Hyperlipidemia   . Hypertension   . Left atrial enlargement   . Obesity   . Palpitations    Past Surgical History:  Procedure Laterality Date  . ABDOMINAL HYSTERECTOMY    . EYE SURGERY     cataract/left eye, right eye had bleeding in back  . KNEE SURGERY    . TRIGGER FINGER RELEASE     Social History:  reports that she has never smoked. She has never used smokeless tobacco. She reports that she drinks alcohol. She reports that she does not use drugs. Allergies: No Known Allergies Family History  Problem Relation Age of Onset  . Heart disease Father   . Hypertension Father   . Alzheimer's disease Mother   . Diabetes Mother   . Diabetes Sister   . Dementia Other     Medications:  Prior to Admission:  Prescriptions Prior to Admission  Medication Sig Dispense Refill Last Dose  . acetaminophen (TYLENOL) 325 MG tablet Take 650 mg by mouth every 6 (six) hours as needed for mild pain.   Past Month at Unknown time  . albuterol (PROVENTIL HFA;VENTOLIN HFA) 108 (90 Base) MCG/ACT inhaler Inhale 2 puffs into the lungs every 6 (six) hours as  needed for wheezing or shortness of breath.   06/06/2017 at Unknown time  . amLODipine (NORVASC) 10 MG tablet Take 1 tablet (10 mg total) by mouth daily. 30 tablet 0 06/07/2017 at Unknown time  . atorvastatin (LIPITOR) 80 MG tablet Take 40 mg by mouth daily.   06/07/2017 at Unknown time  . Dulaglutide (TRULICITY) 05.17MOH/6.0VPSOPN Inject 0.75 mg into the skin once a week. 4 pen 11 Past Week at Unknown time  . gabapentin (NEURONTIN) 100 MG capsule Take 100 mg by mouth 3 (three) times daily.    06/07/2017 at Unknown time  . Insulin Glargine (TOUJEO SOLOSTAR) 300 UNIT/ML SOPN Inject 42 Units into the skin 2 (two) times daily. And pen needles 1/day (Patient taking differently: Inject 44 Units into the skin 2 (two) times daily. And pen needles 1/day) 5 pen 0 06/07/2017 at Unknown time  . metoprolol tartrate (LOPRESSOR) 25 MG tablet Take 25 mg by mouth 2 (two) times daily.   06/07/2017 at 1100  . oxyCODONE (OXY IR/ROXICODONE) 5 MG immediate release tablet Take 5 mg by mouth every 6 (six) hours as needed for severe pain.   06/07/2017 at Unknown time   Scheduled: . amLODipine  10 mg Oral Daily  . atorvastatin  40 mg Oral Daily  . carvedilol  12.5 mg Oral BID WC  . furosemide  60 mg Intravenous BID  .  gabapentin  100 mg Oral TID  . heparin subcutaneous  5,000 Units Subcutaneous Q8H  . insulin aspart  0-15 Units Subcutaneous TID WC  . insulin glargine  35 Units Subcutaneous BID  . pneumococcal 23 valent vaccine  0.5 mL Intramuscular Tomorrow-1000  . potassium chloride  40 mEq Oral Daily  . sodium chloride flush  3 mL Intravenous Q12H   Continuous: . sodium chloride     ROS: as per HPI Blood pressure (!) 153/64, pulse 69, temperature 98.2 F (36.8 C), temperature source Oral, resp. rate 20, height 5' 4.5" (1.638 m), weight 114.7 kg (252 lb 12.8 oz), SpO2 96 %.  General appearance: alert and cooperative obese pleasant female Head: Normocephalic, without obvious abnormality, atraumatic Eyes:  conjunctivae/corneas clear. PERRL, EOM's intact., anicteric Resp: clear to auscultation bilaterally Chest wall: no tenderness Cardio: regular rate and rhythm, S1, S2 normal, no murmur, click, rub or gallop GI: soft, non-tender; bowel sounds normal; no masses,  no organomegaly Extremities: extremities normal, atraumatic, no cyanosis or edema Skin: Skin color, texture, turgor normal. No rashes or lesions Neurologic: Grossly normal Results for orders placed or performed during the hospital encounter of 06/07/17 (from the past 48 hour(s))  Urinalysis, Routine w reflex microscopic     Status: Abnormal   Collection Time: 06/07/17  8:32 PM  Result Value Ref Range   Color, Urine COLORLESS (A) YELLOW   APPearance CLEAR CLEAR   Specific Gravity, Urine 1.005 1.005 - 1.030   pH 5.0 5.0 - 8.0   Glucose, UA NEGATIVE NEGATIVE mg/dL   Hgb urine dipstick SMALL (A) NEGATIVE   Bilirubin Urine NEGATIVE NEGATIVE   Ketones, ur NEGATIVE NEGATIVE mg/dL   Protein, ur 100 (A) NEGATIVE mg/dL   Nitrite NEGATIVE NEGATIVE   Leukocytes, UA NEGATIVE NEGATIVE   RBC / HPF 0-5 0 - 5 RBC/hpf   WBC, UA 0-5 0 - 5 WBC/hpf   Bacteria, UA NONE SEEN NONE SEEN   Squamous Epithelial / LPF 0-5 (A) NONE SEEN  Troponin I     Status: None   Collection Time: 06/07/17  8:57 PM  Result Value Ref Range   Troponin I <0.03 <0.03 ng/mL  Glucose, capillary     Status: Abnormal   Collection Time: 06/07/17 11:18 PM  Result Value Ref Range   Glucose-Capillary 144 (H) 65 - 99 mg/dL   Comment 1 Notify RN    Comment 2 Document in Chart   CBC with Differential/Platelet     Status: Abnormal   Collection Time: 06/08/17  1:57 AM  Result Value Ref Range   WBC 5.6 4.0 - 10.5 K/uL   RBC 3.26 (L) 3.87 - 5.11 MIL/uL   Hemoglobin 9.1 (L) 12.0 - 15.0 g/dL   HCT 28.2 (L) 36.0 - 46.0 %   MCV 86.5 78.0 - 100.0 fL   MCH 27.9 26.0 - 34.0 pg   MCHC 32.3 30.0 - 36.0 g/dL   RDW 13.1 11.5 - 15.5 %   Platelets 330 150 - 400 K/uL   Neutrophils  Relative % 52 %   Neutro Abs 2.9 1.7 - 7.7 K/uL   Lymphocytes Relative 37 %   Lymphs Abs 2.1 0.7 - 4.0 K/uL   Monocytes Relative 9 %   Monocytes Absolute 0.5 0.1 - 1.0 K/uL   Eosinophils Relative 2 %   Eosinophils Absolute 0.1 0.0 - 0.7 K/uL   Basophils Relative 0 %   Basophils Absolute 0.0 0.0 - 0.1 K/uL  Comprehensive metabolic panel     Status:  Abnormal   Collection Time: 06/08/17  1:57 AM  Result Value Ref Range   Sodium 141 135 - 145 mmol/L   Potassium 4.1 3.5 - 5.1 mmol/L   Chloride 106 101 - 111 mmol/L   CO2 24 22 - 32 mmol/L   Glucose, Bld 168 (H) 65 - 99 mg/dL   BUN 36 (H) 6 - 20 mg/dL   Creatinine, Ser 2.53 (H) 0.44 - 1.00 mg/dL   Calcium 8.1 (L) 8.9 - 10.3 mg/dL   Total Protein 6.3 (L) 6.5 - 8.1 g/dL   Albumin 2.9 (L) 3.5 - 5.0 g/dL   AST 39 15 - 41 U/L   ALT 96 (H) 14 - 54 U/L   Alkaline Phosphatase 200 (H) 38 - 126 U/L   Total Bilirubin 0.4 0.3 - 1.2 mg/dL   GFR calc non Af Amer 20 (L) >60 mL/min   GFR calc Af Amer 23 (L) >60 mL/min    Comment: (NOTE) The eGFR has been calculated using the CKD EPI equation. This calculation has not been validated in all clinical situations. eGFR's persistently <60 mL/min signify possible Chronic Kidney Disease.    Anion gap 11 5 - 15  Magnesium     Status: Abnormal   Collection Time: 06/08/17  1:57 AM  Result Value Ref Range   Magnesium 2.5 (H) 1.7 - 2.4 mg/dL  Phosphorus     Status: Abnormal   Collection Time: 06/08/17  1:57 AM  Result Value Ref Range   Phosphorus 5.6 (H) 2.5 - 4.6 mg/dL  Lipid panel     Status: Abnormal   Collection Time: 06/08/17  1:57 AM  Result Value Ref Range   Cholesterol 116 0 - 200 mg/dL   Triglycerides 114 <150 mg/dL   HDL 38 (L) >40 mg/dL   Total CHOL/HDL Ratio 3.1 RATIO   VLDL 23 0 - 40 mg/dL   LDL Cholesterol 55 0 - 99 mg/dL    Comment:        Total Cholesterol/HDL:CHD Risk Coronary Heart Disease Risk Table                     Men   Women  1/2 Average Risk   3.4   3.3  Average Risk        5.0   4.4  2 X Average Risk   9.6   7.1  3 X Average Risk  23.4   11.0        Use the calculated Patient Ratio above and the CHD Risk Table to determine the patient's CHD Risk.        ATP III CLASSIFICATION (LDL):  <100     mg/dL   Optimal  100-129  mg/dL   Near or Above                    Optimal  130-159  mg/dL   Borderline  160-189  mg/dL   High  >190     mg/dL   Very High   Hemoglobin A1c     Status: None   Collection Time: 06/08/17  1:57 AM  Result Value Ref Range   Hgb A1c MFr Bld  4.8 - 5.6 %    QUESTIONABLE RESULTS, RECOMMEND RECOLLECT TO VERIFY    Comment: TEST WILL BE CREDITED A.TOBIAS [facility] RN @ [phone] 06/08/17 BY C.EDENS CORRECTED ON 09/02 AT 0942: PREVIOUSLY REPORTED AS 11.5    Mean Plasma Glucose  mg/dL    QUESTIONABLE RESULTS, RECOMMEND RECOLLECT  TO VERIFY    Comment: TEST WILL BE CREDITED A.TOBIAS [facility] RN @ [phone] 06/08/17 BY C.EDENS CORRECTED ON 09/02 AT 0942: PREVIOUSLY REPORTED AS 283.35   Troponin I     Status: None   Collection Time: 06/08/17  1:57 AM  Result Value Ref Range   Troponin I <0.03 <0.03 ng/mL  Glucose, capillary     Status: None   Collection Time: 06/08/17  7:23 AM  Result Value Ref Range   Glucose-Capillary 73 65 - 99 mg/dL   Comment 1 Notify RN   Troponin I     Status: None   Collection Time: 06/08/17 10:58 AM  Result Value Ref Range   Troponin I <0.03 <0.03 ng/mL  Glucose, capillary     Status: Abnormal   Collection Time: 06/08/17 11:59 AM  Result Value Ref Range   Glucose-Capillary 199 (H) 65 - 99 mg/dL   Comment 1 Notify RN   Glucose, capillary     Status: Abnormal   Collection Time: 06/08/17  4:41 PM  Result Value Ref Range   Glucose-Capillary 191 (H) 65 - 99 mg/dL   Comment 1 Notify RN    Comment 2 Document in Chart   Glucose, capillary     Status: Abnormal   Collection Time: 06/08/17  9:52 PM  Result Value Ref Range   Glucose-Capillary 275 (H) 65 - 99 mg/dL  CBC with Differential/Platelet     Status: Abnormal    Collection Time: 06/09/17  3:30 AM  Result Value Ref Range   WBC 5.5 4.0 - 10.5 K/uL   RBC 3.20 (L) 3.87 - 5.11 MIL/uL   Hemoglobin 8.9 (L) 12.0 - 15.0 g/dL   HCT 28.2 (L) 36.0 - 46.0 %   MCV 88.1 78.0 - 100.0 fL   MCH 27.8 26.0 - 34.0 pg   MCHC 31.6 30.0 - 36.0 g/dL   RDW 13.2 11.5 - 15.5 %   Platelets 340 150 - 400 K/uL   Neutrophils Relative % 45 %   Neutro Abs 2.5 1.7 - 7.7 K/uL   Lymphocytes Relative 44 %   Lymphs Abs 2.5 0.7 - 4.0 K/uL   Monocytes Relative 8 %   Monocytes Absolute 0.4 0.1 - 1.0 K/uL   Eosinophils Relative 3 %   Eosinophils Absolute 0.1 0.0 - 0.7 K/uL   Basophils Relative 0 %   Basophils Absolute 0.0 0.0 - 0.1 K/uL  Comprehensive metabolic panel     Status: Abnormal   Collection Time: 06/09/17  3:30 AM  Result Value Ref Range   Sodium 141 135 - 145 mmol/L   Potassium 3.9 3.5 - 5.1 mmol/L   Chloride 106 101 - 111 mmol/L   CO2 27 22 - 32 mmol/L   Glucose, Bld 174 (H) 65 - 99 mg/dL   BUN 37 (H) 6 - 20 mg/dL   Creatinine, Ser 2.52 (H) 0.44 - 1.00 mg/dL   Calcium 8.1 (L) 8.9 - 10.3 mg/dL   Total Protein 6.3 (L) 6.5 - 8.1 g/dL   Albumin 2.8 (L) 3.5 - 5.0 g/dL   AST 33 15 - 41 U/L   ALT 74 (H) 14 - 54 U/L   Alkaline Phosphatase 196 (H) 38 - 126 U/L   Total Bilirubin 0.4 0.3 - 1.2 mg/dL   GFR calc non Af Amer 20 (L) >60 mL/min   GFR calc Af Amer 23 (L) >60 mL/min    Comment: (NOTE) The eGFR has been calculated using the CKD EPI equation. This calculation has not been  validated in all clinical situations. eGFR's persistently <60 mL/min signify possible Chronic Kidney Disease.    Anion gap 8 5 - 15  Magnesium     Status: None   Collection Time: 06/09/17  3:30 AM  Result Value Ref Range   Magnesium 2.3 1.7 - 2.4 mg/dL  Phosphorus     Status: Abnormal   Collection Time: 06/09/17  3:30 AM  Result Value Ref Range   Phosphorus 5.0 (H) 2.5 - 4.6 mg/dL  Glucose, capillary     Status: None   Collection Time: 06/09/17  7:36 AM  Result Value Ref Range    Glucose-Capillary 66 65 - 99 mg/dL  Glucose, capillary     Status: Abnormal   Collection Time: 06/09/17  8:38 AM  Result Value Ref Range   Glucose-Capillary 109 (H) 65 - 99 mg/dL  Glucose, capillary     Status: Abnormal   Collection Time: 06/09/17 11:14 AM  Result Value Ref Range   Glucose-Capillary 116 (H) 65 - 99 mg/dL   Comment 1 Notify RN   Glucose, capillary     Status: Abnormal   Collection Time: 06/09/17  4:01 PM  Result Value Ref Range   Glucose-Capillary 142 (H) 65 - 99 mg/dL   Dg Chest 1 View  Result Date: 06/09/2017 CLINICAL DATA:  SOB/ Wheeze/ congestion EXAM: CHEST 1 VIEW COMPARISON:  06/07/2017 FINDINGS: Mild enlargement of the cardiopericardial silhouette. Indistinct bibasilar airspace opacities. The interstitial accentuation shown on the prior exam is mildly improved although there still indistinctness of pulmonary vasculature. Atherosclerotic calcification of the aortic arch. IMPRESSION: 1. Nonspecific bibasilar airspace opacities, possibilities include atelectasis, pneumonia, aspiration pneumonitis, or basilar distribution of resolving edema. 2. Improved interstitial edema but continued pulmonary venous hypertension. Mild enlargement of the cardiopericardial silhouette. 3.  Aortic Atherosclerosis (ICD10-I70.0). Electronically Signed   By: Van Clines M.D.   On: 06/09/2017 09:45    Assessment:  1 CKD 3 due to diabetes mellitus 2 AKI, prob hemodynamically mediated due to CHF Plan: 1 Optimize volume status as you are doing 2 Reinforce good BS and BP control 3 SPEP, ANA  Avyukt Cimo C 06/09/2017, 6:04 PM

## 2017-06-09 NOTE — Progress Notes (Signed)
Pt woke from sleep to breakfast tray arrival and CBG check. Pt was hypoglycemic at 66, pt encouraged to eat breakfast and drink the OJ that came on her tray. pt A/O able to do so independently.   Will re-assess CBG shortly.

## 2017-06-09 NOTE — Progress Notes (Signed)
PROGRESS NOTE    ADRYAN DRUCKENMILLER  ZLD:[id number] DOB: [birth year] DOA: 06/07/2017 PCP: Shirline Frees, MD   Brief Narrative:  Latasha [name] is an obese 61 y.o. female with medical history significant of HTN, HLD, Uncontrolled DM requiring Insulin, CKD Stage 3-4, BPPV, Obesity, and Hx of Palpatations who was recently admitted on 05/15/17 for HHS/HONK and subsequently discharged. On Discharge patient stopped taking her diuretics and states she has been drinking more and more water. Patient presented today with a CC of SOB and she states the SOB has gotten worse this last week. She also states that she has chronic leg swelling in the Left and that her swelling bilaterally has gotten worse. Patient states she becomes very dyspneic with exertion and even with ADLs (dressing) and states that she is unable to lie flat to sleep and has to prop herself up. No nausea, vomiting, or CP. Denied any lightheadedness. Denied any other complaints at this time. TRH was called to Admit for Heart Failure. Cardiology was consulted by EDP. Patient is improving and diuresing well. Cr basically unchanged so Nephrology was consulted for further evaluation and recomendations for her AKI on CKD and evaluation for CKD.   Assessment & Plan:   Principal Problem:   Acute respiratory failure with hypoxia (HCC) Active Problems:   Insulin-requiring or dependent type II diabetes mellitus ([place])   Morbid obesity ([place])   Essential hypertension   Hyperlipemia   Palpitations   CKD (chronic kidney disease), stage III   Acute kidney injury superimposed on chronic kidney disease (HCC)   Elevated ALT measurement   Hypertensive heart disease with acute on chronic diastolic congestive heart failure (HCC)   Hyperphosphatemia  Acute Respiratory Failure with Hypoxia 2/2 to Acute Diastolic CHF Exacerbation, improved  -Admitted Inpatient Telemetry -Symptoms consistent with CHF as patient had Worsening Leg Edema, SOB on Exertion, and  Orthopnea; Patient also stopped Diuretics and had significant Water intake  -Maintain O2 Saturations >92% and Continue with Supplemental O2 via [place] -Continuous Pulse Oximetry an Wean O2 as Tolerated; Patient was weaned off of Supplemental O2 via [place] -Initial CXR showed evidence of CHF -Repeat CXR this AM showed Nonspecific bibasilar airspace opacities, possibilities include atelectasis, pneumonia, aspiration pneumonitis, or basilar distribution of resolving edema. Improved interstitial edema but continued pulmonary venous hypertension. Mild enlargement of the cardiopericardial silhouette. Aortic Atherosclerosis  -C/w DuoNeb 3 mL q6hprn for Wheezing/SOB -Will need Home O2 Screen prior to D/C with Ambulatory Pulse Ox  Acute Decompensations of Grade 1 Diastolic CHF -BNP was 903.0 -ECHO 05/15/17 showed The cavity size was normal. Wall thickness was increased in a pattern of moderate LVH. Systolic function was normal. The estimated ejection fraction was in the range of 60% to 65%. Wall motion was normal; there were no regional wallmotion abnormalities. Doppler parameters are consistent with abnormal left ventricular relaxation (grade 1 diastolic dysfunction). -Cycle Cardiac Troponin to r/o New Ischemia and have been Negative <0.03 x 2 -Strict I's/O's, Daily Weights, Fluid Restrict to 1200 mL and SLIV -Cardiology Consulted by EDP for further evaluation and recommendations ; Patient was supposed to follow up with Cardiology as an outpatient for Stress Test -Increased Diuresis of IV Lasix 40 mg BID to 60 mg IV BID yesterday; Given IV 60 mg in ED; Will Defer Further increase to Cardiology as Renal Function Allows  -Cardiology recommending IV Diuretic Therapy at least 1 more day and consider converting to po Lasix 80 mg in AM and 40 mg in PM  -2 Gram Sodium  Diet Recommended by Cardiology -Foley Catheter for Strict I's/O's; Patient is -1.188 Liter and refused Foley Catheter so ? Accuracy  -Weight is down 5  lbs since yesterday  -Changed BB Metoprolol 25 mg po BID to Carvedilol 12.5 mg po BID;  -No ACE/ARB because of Renal Insuffiency  -Will need CHF Educator at D/C   AKI on CKD Stage 3/4 -BUN/Cr on Admission was 39/2.63; Likely from Volume Overload and slightly improved to 36/2.53 -> 37/2.52 -Foley Catheter not inserted because patient refused -Strict I's/O's -Obtained U/A to check for Protein to r/o Nephrotic Syndrome and showed 100 Protein on U/A -Patient never was able to Follow up with Nephrology as an outpatient -Recent Renal U/S showed Renal ultrasound 05/16/17 was consistent with ? medical renal disease changes, negative for renal mass or hydronephrosis -Avoid Nephrotoxics if possible (on IV Lasix) and repeat CMP in AM -Per Cards if Renal Fxn improves, will need ACEi/ARB long Term.  -Consulted Nephrology Dr. Florene Glen for further evaluation and recommendations  Poorly Controlled Diabetes Mellitus Type 2 -C/w Insulin Glaragine 44 units BID changed to 35 units BID -Added Moderate Novolog SSI -Held Patient's Home Trulicity and Metformin was D/C'd -Continue to Monitor CBG's; Ranging from 73-144 -Last HbA1c was 13.1; Repeat HbA1c this Visit was 11.5 but lab states was inaccurate so was re-ordered to ensure accurate result -Will consult Diabetic Education Coordinator  -Repeat HbA1c is pending   HTN -Metoprolol 25 mg po BID changed to Carvedilol 12.5 mg po BID at the recommendation of Cardiology -C/w Amlodipine 10 mg po Daily   -Cardiology recommending starting ACE-I/ARB once Renal Fxn improves -Added IV Hydralazine 10 mg q6hprn for SBP >180 or DBP >100  HLD -Lipid Panel showed Cholesterol of 116, HDL of 38, LDL of 55, TG of 114, VLDL of 23 -C/w Home Atorvastatin 40 mg po Daily   Hx of Palpitations -C/w Telemetry  -Cards Consulting and appreciate Input -Patient is at risk for A Fib -Cardiology recommending outpatient Sleep Study along with lifestyle modifications including  Exercise, Weight Loss  Obesity -BMI 39.73 -Weight Loss Counseling Given -Dietician Consult -Outpatient Evaluation for OSA  Bilateral Leg Edema, improved -Likely from CHF  -Bilateral LE Duplex ordered and showed No evidence of DVT, superficial thrombosis, or Baker's Cyst.  Mildly Elevated ALT -Patient's ALT went from 96 -> 74 -? Volume Overload and Congestive Hepatopathy 2/2 to CHF -Continue to Monitor and Repeat CMP in AM -If continues to worsen will hold Statin    Hyperphosphatemia -Phos Level was 5.6 and improved to 5.0 -Likely from CKD -Nephrology Consulted for CKD and appreciate evaluation for further management and recc's  DVT prophylaxis: Heparin 5,000 units sq q8h Code Status: FULL CODE Family Communication: Discussed with Husband at bedside Disposition Plan: Remain Inpatient as patient is currently being diuresed  Consultants:   Cardiology  Nephrology   Procedures:  VENOUS DUPLEX showed: No evidence of DVT, superficial thrombosis, or Baker's Cyst   Antimicrobials: Anti-infectives    None     Subjective: Seen and examined at bedside and was breathing a lot better but stated that she had lightheadedness and dizziness this AM because her blood sugar dropped to 66. No Nausea or vomiting. Still has leg swelling. No other concerns or complaints at this time.   Objective: Vitals:   06/08/17 2351 06/09/17 0500 06/09/17 1100 06/09/17 1152  BP: 137/66 121/60 (!) 141/59 (!) 153/64  Pulse: 67 63  69  Resp: 18 18  20   Temp:  98.2 F (36.8 C)  98.2 F (36.8 C)  TempSrc:  Oral  Oral  SpO2: 97% 99%  96%  Weight:  114.7 kg (252 lb 12.8 oz)    Height:        Intake/Output Summary (Last 24 hours) at 06/09/17 1513 Last data filed at 06/09/17 1326  Gross per 24 hour  Intake             1180 ml  Output             1851 ml  Net             -671 ml   Filed Weights   06/07/17 2024 06/08/17 0649 06/09/17 0500  Weight: 116.8 kg (257 lb 8 oz) 115.3 kg (254 lb 4.8  oz) 114.7 kg (252 lb 12.8 oz)   Examination: Physical Exam:  Constitutional: Pleasant WN/WD obese female in NAD Eyes: Sclerae anicteric; Conjunctivae non-injected ENMT: External Ears and Nose appear normal. Mucous membranes are moist Neck: Supple. JVD difficult to determine Respiratory: Diminished to auscultation with no appreciable wheezing/rales/rhonchi. Patient was not tachypenic or using any accessory muscles to breathe. Cardiovascular: RRR; S1,S2. 1-2+ LE Edema Abdomen: Soft, NT, Distended due to body habitus GU: Deferred Musculoskeletal: No contractures; No cyanosis Skin: Warm and dry. No rashes on a limited skin evel Neurologic: CN 2-12 grossly intact with no appreciable deficits.  Psychiatric: Pleasant mood and affect. Intact judgement and insight. Awake and O x3.   Data Reviewed: I have personally reviewed following labs and imaging studies  CBC:  Recent Labs Lab 06/07/17 1317 06/08/17 0157 06/09/17 0330  WBC 5.5 5.6 5.5  NEUTROABS  --  2.9 2.5  HGB 9.6* 9.1* 8.9*  HCT 30.1* 28.2* 28.2*  MCV 87.0 86.5 88.1  PLT 338 330 970   Basic Metabolic Panel:  Recent Labs Lab 06/07/17 1317 06/08/17 0157 06/09/17 0330  NA 137 141 141  K 4.5 4.1 3.9  CL 104 106 106  CO2 25 24 27   GLUCOSE 207* 168* 174*  BUN 39* 36* 37*  CREATININE 2.63* 2.53* 2.52*  CALCIUM 8.2* 8.1* 8.1*  MG  --  2.5* 2.3  PHOS  --  5.6* 5.0*   GFR: Estimated Creatinine Clearance: 29.8 mL/min (A) (by C-G formula based on SCr of 2.52 mg/dL (H)). Liver Function Tests:  Recent Labs Lab 06/08/17 0157 06/09/17 0330  AST 39 33  ALT 96* 74*  ALKPHOS 200* 196*  BILITOT 0.4 0.4  PROT 6.3* 6.3*  ALBUMIN 2.9* 2.8*   No results for input(s): LIPASE, AMYLASE in the last 168 hours. No results for input(s): AMMONIA in the last 168 hours. Coagulation Profile: No results for input(s): INR, PROTIME in the last 168 hours. Cardiac Enzymes:  Recent Labs Lab 06/07/17 2057 06/08/17 0157 06/08/17 1058   TROPONINI <0.03 <0.03 <0.03   BNP (last 3 results) No results for input(s): PROBNP in the last 8760 hours. HbA1C:  Recent Labs  06/08/17 0157  HGBA1C QUESTIONABLE RESULTS, RECOMMEND RECOLLECT TO VERIFY   CBG:  Recent Labs Lab 06/08/17 1641 06/08/17 2152 06/09/17 0736 06/09/17 0838 06/09/17 1114  GLUCAP 191* 275* 66 109* 116*   Lipid Profile:  Recent Labs  06/08/17 0157  CHOL 116  HDL 38*  LDLCALC 55  TRIG 114  CHOLHDL 3.1   Thyroid Function Tests: No results for input(s): TSH, T4TOTAL, FREET4, T3FREE, THYROIDAB in the last 72 hours. Anemia Panel: No results for input(s): VITAMINB12, FOLATE, FERRITIN, TIBC, IRON, RETICCTPCT in the last 72 hours. Sepsis Labs: No results for  input(s): PROCALCITON, LATICACIDVEN in the last 168 hours.  No results found for this or any previous visit (from the past 240 hour(s)).   Radiology Studies: Dg Chest 1 View  Result Date: 06/09/2017 CLINICAL DATA:  SOB/ Wheeze/ congestion EXAM: CHEST 1 VIEW COMPARISON:  06/07/2017 FINDINGS: Mild enlargement of the cardiopericardial silhouette. Indistinct bibasilar airspace opacities. The interstitial accentuation shown on the prior exam is mildly improved although there still indistinctness of pulmonary vasculature. Atherosclerotic calcification of the aortic arch. IMPRESSION: 1. Nonspecific bibasilar airspace opacities, possibilities include atelectasis, pneumonia, aspiration pneumonitis, or basilar distribution of resolving edema. 2. Improved interstitial edema but continued pulmonary venous hypertension. Mild enlargement of the cardiopericardial silhouette. 3.  Aortic Atherosclerosis (ICD10-I70.0). Electronically Signed   By: Van Clines M.D.   On: 06/09/2017 09:45   Scheduled Meds: . amLODipine  10 mg Oral Daily  . atorvastatin  40 mg Oral Daily  . carvedilol  12.5 mg Oral BID WC  . furosemide  60 mg Intravenous BID  . gabapentin  100 mg Oral TID  . heparin subcutaneous  5,000 Units  Subcutaneous Q8H  . insulin aspart  0-15 Units Subcutaneous TID WC  . insulin glargine  35 Units Subcutaneous BID  . pneumococcal 23 valent vaccine  0.5 mL Intramuscular Tomorrow-1000  . potassium chloride  40 mEq Oral Daily  . sodium chloride flush  3 mL Intravenous Q12H   Continuous Infusions: . sodium chloride      LOS: 2 days   Kerney Elbe, DO [facility] Pager [phone]  If 7PM-7AM, please contact night-coverage [email or web address] Password [email or web address] 06/09/2017, 3:13 PM

## 2017-06-09 NOTE — Plan of Care (Signed)
Problem: Food- and Nutrition-Related Knowledge Deficit (NB-1.1) Goal: Nutrition education Formal process to instruct or train a patient/client in a skill or to impart knowledge to help patients/clients voluntarily manage or modify food choices and eating behavior to maintain or improve health. Outcome: Completed/Met Date Met: 06/09/17 Nutrition Education Note  RD consulted for nutrition education regarding new onset CHF. Per pt she has a lot of trouble seeing and no longer cooks. They either eat out or get take out for most meals. We discussed ways to eat out and eat healthier. Also discussed additional ways to cut sodium when eating out.  Also reviewed CHO counting.   RD provided "Low Sodium Nutrition Therapy" handout from the [employer] of [employer]. Reviewed patient's dietary recall. Provided examples on ways to decrease sodium intake in diet. Discouraged intake of processed foods and use of salt shaker. Encouraged fresh fruits and vegetables as well as whole grain sources of carbohydrates to maximize fiber intake.   RD discussed why it is important for patient to adhere to diet recommendations, and emphasized the role of fluids, foods to avoid, and importance of weighing self daily. Teach back method used.  Expect fair compliance.  Body mass index is 42.72 kg/m. Pt meets criteria for morbid obesity based on current BMI.  Current diet order is Heart Healthy/CHO modified, patient is consuming approximately 100% of meals at this time. Labs and medications reviewed. No further nutrition interventions warranted at this time. RD contact information provided. If additional nutrition issues arise, please re-consult RD.   [name], [name], [name] Pager [phone] After Hours Pager

## 2017-06-10 DIAGNOSIS — N183 Chronic kidney disease, stage 3 (moderate): Secondary | ICD-10-CM

## 2017-06-10 LAB — CBC WITH DIFFERENTIAL/PLATELET
Basophils Absolute: 0 10*3/uL (ref 0.0–0.1)
Basophils Relative: 0 %
Eosinophils Absolute: 0.1 10*3/uL (ref 0.0–0.7)
Eosinophils Relative: 2 %
HCT: 29.9 % — ABNORMAL LOW (ref 36.0–46.0)
Hemoglobin: 9.4 g/dL — ABNORMAL LOW (ref 12.0–15.0)
LYMPHS ABS: 2.6 10*3/uL (ref 0.7–4.0)
LYMPHS PCT: 46 %
MCH: 28 pg (ref 26.0–34.0)
MCHC: 31.4 g/dL (ref 30.0–36.0)
MCV: 89 fL (ref 78.0–100.0)
MONO ABS: 0.6 10*3/uL (ref 0.1–1.0)
Monocytes Relative: 11 %
Neutro Abs: 2.4 10*3/uL (ref 1.7–7.7)
Neutrophils Relative %: 41 %
PLATELETS: 359 10*3/uL (ref 150–400)
RBC: 3.36 MIL/uL — AB (ref 3.87–5.11)
RDW: 13.5 % (ref 11.5–15.5)
WBC: 5.8 10*3/uL (ref 4.0–10.5)

## 2017-06-10 LAB — COMPREHENSIVE METABOLIC PANEL
ALT: 57 U/L — AB (ref 14–54)
AST: 24 U/L (ref 15–41)
Albumin: 2.9 g/dL — ABNORMAL LOW (ref 3.5–5.0)
Alkaline Phosphatase: 186 U/L — ABNORMAL HIGH (ref 38–126)
Anion gap: 8 (ref 5–15)
BILIRUBIN TOTAL: 0.3 mg/dL (ref 0.3–1.2)
BUN: 40 mg/dL — ABNORMAL HIGH (ref 6–20)
CALCIUM: 8.3 mg/dL — AB (ref 8.9–10.3)
CHLORIDE: 104 mmol/L (ref 101–111)
CO2: 30 mmol/L (ref 22–32)
CREATININE: 2.6 mg/dL — AB (ref 0.44–1.00)
GFR, EST AFRICAN AMERICAN: 22 mL/min — AB (ref >60)
GFR, EST NON AFRICAN AMERICAN: 19 mL/min — AB (ref >60)
Glucose, Bld: 116 mg/dL — ABNORMAL HIGH (ref 65–99)
Potassium: 4.5 mmol/L (ref 3.5–5.1)
Sodium: 142 mmol/L (ref 135–145)
TOTAL PROTEIN: 6.5 g/dL (ref 6.5–8.1)

## 2017-06-10 LAB — HEMOGLOBIN A1C
Hgb A1c MFr Bld: 12.2 % — ABNORMAL HIGH (ref 4.8–5.6)
Hgb A1c MFr Bld: 11.2 % — ABNORMAL HIGH (ref 4.8–5.6)
Mean Plasma Glucose: 303 mg/dL
Mean Plasma Glucose: 274.74 mg/dL

## 2017-06-10 LAB — GLUCOSE, CAPILLARY
Glucose-Capillary: 151 mg/dL — ABNORMAL HIGH (ref 65–99)
Glucose-Capillary: 128 mg/dL — ABNORMAL HIGH (ref 65–99)
Glucose-Capillary: 94 mg/dL (ref 65–99)
Glucose-Capillary: 161 mg/dL — ABNORMAL HIGH (ref 65–99)
Glucose-Capillary: 239 mg/dL — ABNORMAL HIGH (ref 65–99)

## 2017-06-10 LAB — PHOSPHORUS: PHOSPHORUS: 5.6 mg/dL — AB (ref 2.5–4.6)

## 2017-06-10 LAB — MAGNESIUM: MAGNESIUM: 2.2 mg/dL (ref 1.7–2.4)

## 2017-06-10 MED ORDER — FUROSEMIDE 40 MG PO TABS
40.0000 mg | ORAL_TABLET | Freq: Every evening | ORAL | Status: DC
  Administered 2017-06-10: 40 mg via ORAL
  Filled 2017-06-10: qty 1

## 2017-06-10 MED ORDER — FUROSEMIDE 40 MG PO TABS: 40.0000 mg | ORAL_TABLET | Freq: Every evening | ORAL | 0 refills | Status: DC

## 2017-06-10 MED ORDER — POTASSIUM CHLORIDE CRYS ER 20 MEQ PO TBCR: 20.0000 meq | EXTENDED_RELEASE_TABLET | Freq: Every day | ORAL | 0 refills | Status: DC

## 2017-06-10 MED ORDER — FUROSEMIDE 80 MG PO TABS: 80.0000 mg | ORAL_TABLET | Freq: Every morning | ORAL | 0 refills | Status: DC

## 2017-06-10 MED ORDER — CARVEDILOL 12.5 MG PO TABS: 12.5000 mg | ORAL_TABLET | Freq: Two times a day (BID) | ORAL | 0 refills | Status: DC

## 2017-06-10 MED ORDER — FUROSEMIDE 80 MG PO TABS
80.0000 mg | ORAL_TABLET | Freq: Every morning | ORAL | Status: DC
  Administered 2017-06-10: 80 mg via ORAL
  Filled 2017-06-10: qty 1

## 2017-06-10 NOTE — Progress Notes (Signed)
Progress Note  Patient Name: Latasha [name] Date of Encounter: 06/10/2017  Primary Cardiologist: Radford Pax  Subjective   SOB improved, No CP. Walking in room. Feels better.   Inpatient Medications    Scheduled Meds: . amLODipine  10 mg Oral Daily  . atorvastatin  40 mg Oral Daily  . carvedilol  12.5 mg Oral BID WC  . furosemide  40 mg Oral QPM  . furosemide  80 mg Oral q morning - 10a  . gabapentin  100 mg Oral TID  . heparin subcutaneous  5,000 Units Subcutaneous Q8H  . insulin aspart  0-15 Units Subcutaneous TID WC  . insulin glargine  35 Units Subcutaneous BID  . pneumococcal 23 valent vaccine  0.5 mL Intramuscular Tomorrow-1000  . potassium chloride  40 mEq Oral Daily  . sodium chloride flush  3 mL Intravenous Q12H   Continuous Infusions: . sodium chloride     PRN Meds: sodium chloride, acetaminophen, hydrALAZINE, ipratropium-albuterol, ondansetron (ZOFRAN) IV, oxyCODONE, sodium chloride flush   Vital Signs    Vitals:   06/09/17 1100 06/09/17 1152 06/09/17 2052 06/10/17 0629  BP: (!) 141/59 (!) 153/64 (!) 140/58 135/60  Pulse:  69 68 67  Resp:  20 18 18   Temp:  98.2 F (36.8 C) 98.4 F (36.9 C) 98.2 F (36.8 C)  TempSrc:  Oral Oral Oral  SpO2:  96% 92%   Weight:    237 lb 3.2 oz (107.6 kg)  Height:        Intake/Output Summary (Last 24 hours) at 06/10/17 1015 Last data filed at 06/10/17 1001  Gross per 24 hour  Intake              843 ml  Output             1400 ml  Net             -557 ml   Filed Weights   06/08/17 0649 06/09/17 0500 06/10/17 0629  Weight: 254 lb 4.8 oz (115.3 kg) 252 lb 12.8 oz (114.7 kg) 237 lb 3.2 oz (107.6 kg)    Telemetry    NSR - Personally Reviewed  ECG    NSR - Personally Reviewed  Physical Exam   GEN: No acute distress.  obese Neck: No JVD Cardiac: RRR, no murmurs, rubs, or gallops.  Respiratory: Clear to auscultation bilaterally. GI: Soft, nontender, non-distended  MS: No edema; No deformity. Neuro:  Nonfocal   Psych: Normal affect   Labs    Chemistry Recent Labs Lab 06/08/17 0157 06/09/17 0330 06/10/17 0606  NA 141 141 142  K 4.1 3.9 4.5  CL 106 106 104  CO2 24 27 30   GLUCOSE 168* 174* 116*  BUN 36* 37* 40*  CREATININE 2.53* 2.52* 2.60*  CALCIUM 8.1* 8.1* 8.3*  PROT 6.3* 6.3* 6.5  ALBUMIN 2.9* 2.8* 2.9*  AST 39 33 24  ALT 96* 74* 57*  ALKPHOS 200* 196* 186*  BILITOT 0.4 0.4 0.3  GFRNONAA 20* 20* 19*  GFRAA 23* 23* 22*  ANIONGAP 11 8 8      Hematology Recent Labs Lab 06/08/17 0157 06/09/17 0330 06/10/17 0606  WBC 5.6 5.5 5.8  RBC 3.26* 3.20* 3.36*  HGB 9.1* 8.9* 9.4*  HCT 28.2* 28.2* 29.9*  MCV 86.5 88.1 89.0  MCH 27.9 27.8 28.0  MCHC 32.3 31.6 31.4  RDW 13.1 13.2 13.5  PLT 330 340 359    Cardiac Enzymes Recent Labs Lab 06/07/17 2057 06/08/17 0157 06/08/17 1058  TROPONINI <  0.03 <0.03 <0.03    Recent Labs Lab 06/07/17 1337  TROPIPOC 0.00     BNP Recent Labs Lab 06/07/17 1317  BNP 141.0*     DDimer No results for input(s): DDIMER in the last 168 hours.   Radiology    Dg Chest 1 View  Result Date: 06/09/2017 CLINICAL DATA:  SOB/ Wheeze/ congestion EXAM: CHEST 1 VIEW COMPARISON:  06/07/2017 FINDINGS: Mild enlargement of the cardiopericardial silhouette. Indistinct bibasilar airspace opacities. The interstitial accentuation shown on the prior exam is mildly improved although there still indistinctness of pulmonary vasculature. Atherosclerotic calcification of the aortic arch. IMPRESSION: 1. Nonspecific bibasilar airspace opacities, possibilities include atelectasis, pneumonia, aspiration pneumonitis, or basilar distribution of resolving edema. 2. Improved interstitial edema but continued pulmonary venous hypertension. Mild enlargement of the cardiopericardial silhouette. 3.  Aortic Atherosclerosis (ICD10-I70.0). Electronically Signed   By: Van Clines M.D.   On: 06/09/2017 09:45    Cardiac Studies   ECHO 05/15/17  - Left ventricle: The cavity  size was normal. Wall thickness was   increased in a pattern of moderate LVH. Systolic function was   normal. The estimated ejection fraction was in the range of 60%   to 65%. Wall motion was normal; there were no regional wall   motion abnormalities. Doppler parameters are consistent with   abnormal left ventricular relaxation (grade 1 diastolic   dysfunction). - Mitral valve: There was mild regurgitation. - Left atrium: The atrium was moderately to severely dilated. - Right atrium: The atrium was mildly dilated. - Pulmonary arteries: Systolic pressure was moderately increased.   PA peak pressure: 46 mm Hg (S). - Pericardium, extracardiac: A small pericardial effusion was   identified.  Patient Profile     61 y.o. female with CKD 3-4, HTN, HL, DM with retinopathy, admitted with uncontrolled glucose 500 and increased dyspnea.   Assessment & Plan    Acute on chronic diastolic HF  - Feels better today.   - OK with change to PO Lasix to 80/40  Hypertensive heart disease with heart failure  - better on Coreg rather than metoprolol  - amlodipine  - consider restart ARB   Morbid obesity  - she understands that weight loss if of great importance  Will sign off. Please call if ?  Signed, Candee Furbish, MD  06/10/2017, 10:15 AM

## 2017-06-10 NOTE — Progress Notes (Signed)
Patient is alert oriented experiencing hand cramping, replaced potassium and mustard.

## 2017-06-10 NOTE — Progress Notes (Signed)
Physical Therapy Treatment Patient Details Name: Latasha [name] MRN: [id number] DOB: 11/08/1955 Today's Date: 06/10/2017    History of Present Illness Patient is a 61 yo female admitted on 06/07/17 with SOB and LE edema - stopped taking Lasix.   Patient with acute resp failure with hypoxia due to CHF exacerbation, and HTN.    PMH:  HTN, HLD, uncontrolled DM, CKD, vertigo, obesity.    PT Comments    Patient is progressing toward mobility goals and able to tolerated increased gait distance and stair negotiation this session. Pt is much more steady with use of AD for ambulation and encouraged to use RW upon d/c. Pt with SpO2 desat to 86% with ambulation and stairs and up to 92% with rest. Continue to progress as tolerated.    Follow Up Recommendations  No PT follow up;Supervision - Intermittent     Equipment Recommendations  None recommended by PT    Recommendations for Other Services       Precautions / Restrictions Precautions Precautions: None    Mobility  Bed Mobility Overal bed mobility: Modified Independent             General bed mobility comments: increased effort  Transfers Overall transfer level: Needs assistance Equipment used: None Transfers: Sit to/from Stand Sit to Stand: Min guard         General transfer comment: min guard for safety  Ambulation/Gait Ambulation/Gait assistance: Min guard;Supervision Ambulation Distance (Feet): 220 Feet Assistive device: Rolling walker (2 wheeled) Gait Pattern/deviations: Step-through pattern;Decreased stride length;Shuffle Gait velocity: decreased   General Gait Details: decreased cadence; cues for posture and increased bilat step height   Stairs Stairs: Yes   Stair Management: Two rails;Step to pattern;Forwards;One rail Right;Sideways Number of Stairs: 10 General stair comments: cues for sequencing and technique; ascended with bilat rails and descended sideways with single rail  Wheelchair Mobility     Modified Rankin (Stroke Patients Only)       Balance Overall balance assessment: Needs assistance Sitting-balance support: No upper extremity supported;Feet supported Sitting balance-Leahy Scale: Good     Standing balance support: No upper extremity supported;During functional activity Standing balance-Leahy Scale: Fair Standing balance comment: pt able to stand at sink and perform hand hygiene without UE support or LOB however tends to hold ont objects if not using RW for dynamic activities                            Cognition Arousal/Alertness: Awake/alert Behavior During Therapy: WFL for tasks assessed/performed Overall Cognitive Status: Within Functional Limits for tasks assessed                                        Exercises      General Comments General comments (skin integrity, edema, etc.): SpO2 86-92% on RA throughout session      Pertinent Vitals/Pain Pain Assessment: No/denies pain (no pain but discomfort in feet due to edema)    Home Living                      Prior Function            PT Goals (current goals can now be found in the care plan section) Acute Rehab PT Goals PT Goal Formulation: With patient Time For Goal Achievement: 06/15/17 Potential to Achieve Goals: Good Progress towards PT  goals: Progressing toward goals    Frequency    Min 3X/week      PT Plan Current plan remains appropriate    Co-evaluation              AM-PAC PT "6 Clicks" Daily Activity  Outcome Measure  Difficulty turning over in bed (including adjusting bedclothes, sheets and blankets)?: None Difficulty moving from lying on back to sitting on the side of the bed? : None Difficulty sitting down on and standing up from a chair with arms (e.g., wheelchair, bedside commode, etc,.)?: None Help needed moving to and from a bed to chair (including a wheelchair)?: None Help needed walking in hospital room?: None Help needed  climbing 3-5 steps with a railing? : A Little 6 Click Score: 23    End of Session Equipment Utilized During Treatment: Gait belt Activity Tolerance: Patient tolerated treatment well Patient left: with call bell/phone within reach;in bed;with family/visitor present Nurse Communication: Mobility status PT Visit Diagnosis: Other abnormalities of gait and mobility (R26.89);Muscle weakness (generalized) (M62.81)     Time: 5110-2111 PT Time Calculation (min) (ACUTE ONLY): 25 min  Charges:  $Gait Training: 8-22 mins $Therapeutic Activity: 8-22 mins                    G Codes:       Earney Navy, PTA Pager: [phone]     Darliss Cheney 06/10/2017, 3:48 PM

## 2017-06-10 NOTE — Progress Notes (Signed)
  Assessment:  1 CKD 3 due to diabetes mellitus 2 AKI, prob hemodynamically mediated due to CHF Plan: 1 Optimize volume status as you are doing 2 Reinforce good BS and BP control 3 I will schedule OP OV with pt in 2-4 week, will sign off   Subjective: Interval History: She is working on a healthier lifestyle when she leaves the hospital.   Objective: Vital signs in last 24 hours: Temp:  [98.2 F (36.8 C)-98.4 F (36.9 C)] 98.2 F (36.8 C) (09/04 0629) Pulse Rate:  [67-69] 67 (09/04 0629) Resp:  [18-20] 18 (09/04 0629) BP: (135-153)/(58-64) 135/60 (09/04 0629) SpO2:  [92 %-96 %] 92 % (09/03 2052) Weight:  [107.6 kg (237 lb 3.2 oz)] 107.6 kg (237 lb 3.2 oz) (09/04 0629) Weight change: -7.076 kg (-15 lb 9.6 oz)  Intake/Output from previous day: 09/03 0701 - 09/04 0700 In: 963 [P.O.:960; I.V.:3] Out: 1400 [Urine:1400] Intake/Output this shift: Total I/O In: 120 [P.O.:120] Out: -   General appearance: alert and cooperative Resp: diminished breath sounds bibasilar Cardio: regular rate and rhythm, S1, S2 normal, no murmur, click, rub or gallop Extremities: edema 1+  Lab Results:  Recent Labs  06/09/17 0330 06/10/17 0606  WBC 5.5 5.8  HGB 8.9* 9.4*  HCT 28.2* 29.9*  PLT 340 359   BMET:  Recent Labs  06/09/17 0330 06/10/17 0606  NA 141 142  K 3.9 4.5  CL 106 104  CO2 27 30  GLUCOSE 174* 116*  BUN 37* 40*  CREATININE 2.52* 2.60*  CALCIUM 8.1* 8.3*   No results for input(s): PTH in the last 72 hours. Iron Studies: No results for input(s): IRON, TIBC, TRANSFERRIN, FERRITIN in the last 72 hours. Studies/Results: Dg Chest 1 View  Result Date: 06/09/2017 CLINICAL DATA:  SOB/ Wheeze/ congestion EXAM: CHEST 1 VIEW COMPARISON:  06/07/2017 FINDINGS: Mild enlargement of the cardiopericardial silhouette. Indistinct bibasilar airspace opacities. The interstitial accentuation shown on the prior exam is mildly improved although there still indistinctness of pulmonary  vasculature. Atherosclerotic calcification of the aortic arch. IMPRESSION: 1. Nonspecific bibasilar airspace opacities, possibilities include atelectasis, pneumonia, aspiration pneumonitis, or basilar distribution of resolving edema. 2. Improved interstitial edema but continued pulmonary venous hypertension. Mild enlargement of the cardiopericardial silhouette. 3.  Aortic Atherosclerosis (ICD10-I70.0). Electronically Signed   By: Van Clines M.D.   On: 06/09/2017 09:45    Scheduled: . amLODipine  10 mg Oral Daily  . atorvastatin  40 mg Oral Daily  . carvedilol  12.5 mg Oral BID WC  . furosemide  40 mg Oral QPM  . furosemide  80 mg Oral q morning - 10a  . gabapentin  100 mg Oral TID  . heparin subcutaneous  5,000 Units Subcutaneous Q8H  . insulin aspart  0-15 Units Subcutaneous TID WC  . insulin glargine  35 Units Subcutaneous BID  . pneumococcal 23 valent vaccine  0.5 mL Intramuscular Tomorrow-1000  . potassium chloride  40 mEq Oral Daily  . sodium chloride flush  3 mL Intravenous Q12H     LOS: 3 days   Margeret Stachnik C 06/10/2017,11:21 AM

## 2017-06-10 NOTE — Progress Notes (Signed)
Inpatient Diabetes Program Recommendations  AACE/ADA: New Consensus Statement on Inpatient Glycemic Control (2015)  Target Ranges:  Prepandial:   less than 140 mg/dL      Peak postprandial:   less than 180 mg/dL (1-2 hours)      Critically ill patients:  140 - 180 mg/dL   Lab Results  Component Value Date   GLUCAP 161 (H) 06/10/2017   HGBA1C 11.2 (H) 06/10/2017    Review of Glycemic ControlResults for SEYMONE, FORLENZA (MRN [id number]) as of 06/10/2017 13:28  Ref. Range 06/09/2017 20:53 06/10/2017 02:52 06/10/2017 05:54 06/10/2017 08:07 06/10/2017 11:49  Glucose-Capillary Latest Ref Range: 65 - 99 mg/dL 160 (H) 151 (H) 128 (H) 94 161 (H)    Diabetes history: Type 2 diabetes Outpatient Diabetes medications: Toujeo 44 units bid, Trulicity 6.38 mg weekly Current orders for Inpatient glycemic control:  Novolog moderate tid with meals, Lantus 35 units bid  Inpatient Diabetes Program Recommendations:   Blood sugars well controlled on Lantus 35 units bid.  Note that A1C is a little better since June 2018.  On last admit, patient states that she has orange card for medications.   Thanks, Adah Perl, RN, BC-ADM Inpatient Diabetes Coordinator Pager [phone] (8a-5p)

## 2017-06-10 NOTE — Progress Notes (Signed)
Pt up walking in the hall on room air with 02 sats  97s.

## 2017-06-10 NOTE — Progress Notes (Signed)
Patient had no complaints or concerns, family at bedside. Educated patient about leaving measuring hat in the toilet for strict I&O's- because she took it out.

## 2017-06-10 NOTE — Progress Notes (Signed)
Nursing student transporting patient to main entrance, iv  Tele off. For discharge

## 2017-06-10 NOTE — Progress Notes (Signed)
Spoke with patient in regards to her improved but still elevated A1c level.  Patient has a difficult time seeing due to "eye surgery that went wrong" Patient and husband have been eating out a lot. Patient had been able to see Dr. Renato Shin, Endocrinology, 2-3 times before her insurance fell through due to her inability to work with her vertigo diagnosis.   Patient reports being placed on Toujeo 80 units and Trulicity once a week. Patient states she has a medication pass through the health department that allows her to get both her Toujeo and Trulicity. Patient sees Dr. Shirline Frees for DM control currently.   Patient has made a plan with her daughter, who she says eats very healthy. Daughter will fix meals and bring them to patient. Daughter is also coordinating evening walks as a family. Patient notices her glucose is the highest in the evenings.   Spoke with patient about her kidney function and her glucose trends while inpatient. Spoke with patient about her need to control her diet to match the inpatient meals so that her trends at home are consistent with the trends while inpatient.   Patient will need to match inpatient regimen for outpatient when d/c'd.  Thanks,  Tama Headings RN, MSN, [facility] Inpatient Diabetes Coordinator Team Pager [phone] (8a-5p)

## 2017-06-10 NOTE — Progress Notes (Signed)
SATURATION QUALIFICATIONS: (This note is used to comply with regulatory documentation for home oxygen)  Patient Saturations on Room Air at Rest = 96%  Patient Saturations on Room Air while Ambulating = 87%  Patient Saturations on Liters of oxygen while Ambulating = %  Please briefly explain why patient needs home oxygen:

## 2017-06-11 LAB — PROTEIN ELECTROPHORESIS, SERUM
A/G Ratio: 0.9 (ref 0.7–1.7)
ALBUMIN ELP: 2.9 g/dL (ref 2.9–4.4)
ALPHA-1-GLOBULIN: 0.2 g/dL (ref 0.0–0.4)
ALPHA-2-GLOBULIN: 1 g/dL (ref 0.4–1.0)
BETA GLOBULIN: 1.1 g/dL (ref 0.7–1.3)
GAMMA GLOBULIN: 1.1 g/dL (ref 0.4–1.8)
Globulin, Total: 3.4 g/dL (ref 2.2–3.9)
Total Protein ELP: 6.3 g/dL (ref 6.0–8.5)

## 2017-06-11 LAB — ANTINUCLEAR ANTIBODIES, IFA: ANA Ab, IFA: NEGATIVE

## 2017-06-11 NOTE — Discharge Summary (Signed)
Physician Discharge Summary  Latasha [name] HFW:[id number] DOB: 1956-03-22 DOA: 06/07/2017  PCP: Shirline Frees, MD  Admit date: 06/07/2017 Discharge date: 06/11/2017  Admitted From: Home Disposition:  Home  Recommendations for Outpatient Follow-up:  1. Follow up with PCP in 1-2 weeks; Have PCP Repeat Ambulatory O2 Screen as Nursing Documentation unclear 2. Follow up with Cardiology within 1 week 3. Follow up with Nephrology within 1-2 weeks 4. Please obtain CMP/CBC, Mag, Phos in one week 5. Please follow up on the following pending results: SPEP, ANA  Home Health: No Equipment/Devices: None    Discharge Condition: Stable CODE STATUS: FULL CODE Diet recommendation: Heart Healthy Carb Modified Diet  Brief/Interim Summary: Latasha [name] an obese 61 y.o.femalewith medical history significant of HTN, HLD, Uncontrolled DM requiring Insulin, CKD Stage 3-4, BPPV, Obesity, and Hx of Palpatations who was recently admitted on 05/15/17 for HHS/HONK and subsequently discharged. On Discharge patient stopped taking her diuretics and states she has been drinking more and more water. Patient presented today with a CC of SOB and she states the SOB has gotten worse this last week. She also states that she has chronic leg swelling in the Left and that her swelling bilaterally has gotten worse. Patient states she becomes very dyspneic with exertion and even with ADLs (dressing) and states that she is unable to lie flat to sleep and has to prop herself up. No nausea, vomiting, or CP. Denied any lightheadedness. Denied any other complaints at this time. TRH was called to Admit for Heart Failure. Cardiology was consulted by EDP. Patient is improving and diuresing well. Cr basically unchanged so Nephrology was consulted for further evaluation and recomendations for her AKI on CKD and evaluation for CKD. Patient improved and was deemed medically stable after she was transitioned to po Diuretics. Patient is to follow  up with PCP, Cardiology, and with Nephrology as an outpatient. Strict Salt Diet and Limited Fluid intake along with Daily Weights was advised and patient to get a scale and follow up with Dr. [name] in Cardiology.   Discharge Diagnoses:  Principal Problem:   Acute respiratory failure with hypoxia ([name]) Active Problems:   Insulin-requiring or dependent type II diabetes mellitus ([name])   Morbid obesity ([name])   Essential hypertension   Hyperlipemia   Palpitations   CKD (chronic kidney disease), stage III   Acute kidney injury superimposed on chronic kidney disease (HCC)   Elevated ALT measurement   Hypertensive heart disease with acute on chronic diastolic congestive heart failure (HCC)   Hyperphosphatemia  Acute Respiratory Failure with Hypoxia 2/2 to Acute Diastolic CHF Exacerbation, improved  -Admitted Inpatient Telemetry -Symptoms consistent with CHF as patient had Worsening Leg Edema, SOB on Exertion, and Orthopnea; Patient also stopped Diuretics and had significant Water intake  -Maintain O2 Saturations >92% and Continue with Supplemental O2 via [place] -Continuous Pulse Oximetry an Wean O2 as Tolerated; Patient was weaned off of Supplemental O2 via [place] -Initial CXR showed evidence of CHF -Repeat CXR this AM showed Nonspecific bibasilar airspace opacities, possibilities include atelectasis, pneumonia, aspiration pneumonitis, or basilar distribution of resolving edema. Improved interstitial edema but continued pulmonary venous hypertension. Mild enlargement of the cardiopericardial silhouette. Aortic Atherosclerosis  -C/w DuoNeb 3 mL q6hprn for Wheezing/SOB -Home O2 Screen done by initally PT and patient Desaturated to 96%; Repeat Home O2 Screen done and nurse stated documented was Ambulating and Saturating 97% on Room Air (However she documented 87% on Saturation Qualifications note). -Follow up with PCP and repeat O2 Walk  Screen as an outpatient.   Acute Decompensations of Grade 1 Diastolic  CHF -BNP was 856.3 -ECHO 05/15/17 showed The cavity size was normal. Wall thickness was increased in a pattern of moderate LVH. Systolic function was normal. The estimated ejection fraction was in the range of 60% to 65%. Wall motion was normal; there were no regional wallmotion abnormalities. Doppler parameters are consistent with abnormal left ventricular relaxation (grade 1 diastolic dysfunction). -Cycle Cardiac Troponin to r/o New Ischemia and have been Negative <0.03 x 2 -Strict I's/O's, Daily Weights, Fluid Restrict to 1200 mL and SLIV -Cardiology Consulted by EDP for further evaluation and recommendations ; Patient was supposed to follow up with Cardiology as an outpatient for Stress Test  -IV Lasix changed to po Lasix 80 mg in AM and 40 mg in PM  -2 Gram Sodium Diet Recommended by Cardiology -Foley Catheter for Strict I's/O's; Patient is -2.825 Liter and refused Foley Catheter so ? Accuracy  -Weight is down 20 lbs since Admission  -Changed BB Metoprolol 25 mg po BID to Carvedilol 12.5 mg po BID;  -No ACE/ARB because of Renal Insuffiency  -Will need CHF Educator at D/C as an outpatient   AKI on CKD Stage 3 -BUN/Cr on Admission was 39/2.63; Likely from Volume Overload and slightly improved to 36/2.53 -> 37/2.52 -> 40/2.60 -Foley Catheter not inserted because patient refused -Strict I's/O's -Obtained U/A to check for Protein to r/o Nephrotic Syndrome and showed 100 Protein on U/A -Patient never was able to Follow up with Nephrology as an outpatient -Recent Renal U/S showed Renal ultrasound 8/10/18was consistent with ? medical renal disease changes, negative for renal mass or hydronephrosis -Avoid Nephrotoxics if possible (on IV Lasix) and repeat CMP in AM -Per Cards if Renal Fxn improves, will need ACEi/ARB long Term.  -Consulted Nephrology Dr. Florene Glen for further evaluation and recommendations and recommended optimizing Volume Status in addition to Reinforcing BS and BP Control.  Recommended Outpatient Visit in 1-2 weeks.   Poorly Controlled Diabetes Mellitus Type 2 -C/w Insulin Glaragine 44 units BID changed to 35 units BID -Added Moderate Novolog SSI -Held Patient's Home Trulicity and Metformin was D/C'd -Continue to Monitor CBG's; Ranging from 73-144 -Last HbA1c was 13.1; Repeat HbA1c this Visit was 11.5 but lab states was inaccurate so was re-ordered to ensure accurate result -Will consult Diabetic Education Coordinator  -Repeat HbA1c is 11.2 -Follow up with PCP for further Insulin Adjustment   HTN -Metoprolol 25 mg po BID changed to Carvedilol 12.5 mg po BID at the recommendation of Cardiology -C/w Amlodipine 10 mg po Daily   -Cardiology recommending starting ACE-I/ARB once Renal Fxn improves -Added IV Hydralazine 10 mg q6hprn for SBP >180 or DBP >100 while hospitalized -Follow up with PCP, Cardiology, and Nephrology as an outpatient for further BP Management   HLD -Lipid Panel showed Cholesterol of 116, HDL of 38, LDL of 55, TG of 114, VLDL of 23 -C/w Home Atorvastatin 40 mg po Daily   Hx of Palpitations -C/w Telemetry  -Cards Consulting and appreciate Input -Patient is at risk for A Fib -Cardiology recommending outpatient Sleep Study along with lifestyle modifications including Exercise, Weight Loss -Follow up as an outpatient with Cardiology and have Sleep Test as an outpatient  Obesity -BMI 39.73 on Admission however was not Accurate; Accurate BMI of 43.53 -Weight Loss Counseling Given -Dietician Consult -Outpatient Evaluation for OSA  Bilateral Leg Edema, improved -Likely from CHF  -Bilateral LE Duplex ordered and showed No evidence of DVT, superficial thrombosis, or  Baker's Cyst. -C/w Lasix 80 mg in AM and 40 mg in PM  Mildly Elevated ALT -Patient's ALT went from 96 -> 74 -> 57 -? Volume Overload and Congestive Hepatopathy 2/2 to CHF -Continue to Monitor and Repeat CMP in AM -If continues to worsen will hold Statin   -Repeat CMP  as an outpatient   Hyperphosphatemia -Phos Level was 5.6 an -Likely from CKD -Nephrology Consulted for CKD and appreciate evaluation for further management and recc's -Follow up with Nephro as an outpatient   Discharge Instructions  Discharge Instructions    (HEART FAILURE PATIENTS) Call MD:  Anytime you have any of the following symptoms: 1) 3 pound weight gain in 24 hours or 5 pounds in 1 week 2) shortness of breath, with or without a dry hacking cough 3) swelling in the hands, feet or stomach 4) if you have to sleep on extra pillows at night in order to breathe.    Complete by:  As directed    Call MD for:  difficulty breathing, headache or visual disturbances    Complete by:  As directed    Call MD for:  extreme fatigue    Complete by:  As directed    Call MD for:  hives    Complete by:  As directed    Call MD for:  persistant dizziness or light-headedness    Complete by:  As directed    Call MD for:  persistant nausea and vomiting    Complete by:  As directed    Call MD for:  redness, tenderness, or signs of infection (pain, swelling, redness, odor or green/yellow discharge around incision site)    Complete by:  As directed    Call MD for:  severe uncontrolled pain    Complete by:  As directed    Call MD for:  temperature >100.4    Complete by:  As directed    Diet - low sodium heart healthy    Complete by:  As directed    Diet Carb Modified    Complete by:  As directed    Discharge instructions    Complete by:  As directed    Follow up with PCP, Cardiology, and Nephrology as an outpatient. Take all medications as prescribed. If symptoms change or worsen please return to the ED for evaluation.   Increase activity slowly    Complete by:  As directed      Allergies as of 06/10/2017   No Known Allergies     Medication List    STOP taking these medications   metoprolol tartrate 25 MG tablet Commonly known as:  LOPRESSOR     TAKE these medications   acetaminophen  325 MG tablet Commonly known as:  TYLENOL Take 650 mg by mouth every 6 (six) hours as needed for mild pain.   albuterol 108 (90 Base) MCG/ACT inhaler Commonly known as:  PROVENTIL HFA;VENTOLIN HFA Inhale 2 puffs into the lungs every 6 (six) hours as needed for wheezing or shortness of breath.   amLODipine 10 MG tablet Commonly known as:  NORVASC Take 1 tablet (10 mg total) by mouth daily.   atorvastatin 80 MG tablet Commonly known as:  LIPITOR Take 40 mg by mouth daily.   carvedilol 12.5 MG tablet Commonly known as:  COREG Take 1 tablet (12.5 mg total) by mouth 2 (two) times daily with a meal.   Dulaglutide 0.75 MG/0.5ML Sopn Commonly known as:  TRULICITY Inject 2.20 mg into the skin once a  week.   furosemide 40 MG tablet Commonly known as:  LASIX Take 1 tablet (40 mg total) by mouth every evening.   furosemide 80 MG tablet Commonly known as:  LASIX Take 1 tablet (80 mg total) by mouth every morning.   gabapentin 100 MG capsule Commonly known as:  NEURONTIN Take 100 mg by mouth 3 (three) times daily.   Insulin Glargine 300 UNIT/ML Sopn Commonly known as:  TOUJEO SOLOSTAR Inject 42 Units into the skin 2 (two) times daily. And pen needles 1/day What changed:  how much to take  additional instructions   oxyCODONE 5 MG immediate release tablet Commonly known as:  Oxy IR/ROXICODONE Take 5 mg by mouth every 6 (six) hours as needed for severe pain.   potassium chloride SA 20 MEQ tablet Commonly known as:  K-DUR,KLOR-CON Take 1 tablet (20 mEq total) by mouth daily.            Discharge Care Instructions        Start     Ordered   06/11/17 0000  furosemide (LASIX) 80 MG tablet   Every morning - 10a     06/10/17 1734   06/11/17 0000  potassium chloride SA (K-DUR,KLOR-CON) 20 MEQ tablet  Daily     06/10/17 1734   06/10/17 0000  carvedilol (COREG) 12.5 MG tablet  2 times daily with meals     06/10/17 1734   06/10/17 0000  furosemide (LASIX) 40 MG tablet  Every  evening     06/10/17 1734   06/10/17 0000  Increase activity slowly     06/10/17 1734   06/10/17 0000  Diet - low sodium heart healthy     06/10/17 1734   06/10/17 0000  Discharge instructions    Comments:  Follow up with PCP, Cardiology, and Nephrology as an outpatient. Take all medications as prescribed. If symptoms change or worsen please return to the ED for evaluation.   06/10/17 1734   06/10/17 0000  Diet Carb Modified     06/10/17 1734   06/10/17 0000  Call MD for:  temperature >100.4     06/10/17 1734   06/10/17 0000  Call MD for:  persistant nausea and vomiting     06/10/17 1734   06/10/17 0000  Call MD for:  severe uncontrolled pain     06/10/17 1734   06/10/17 0000  Call MD for:  redness, tenderness, or signs of infection (pain, swelling, redness, odor or green/yellow discharge around incision site)     06/10/17 1734   06/10/17 0000  Call MD for:  difficulty breathing, headache or visual disturbances     06/10/17 1734   06/10/17 0000  Call MD for:  hives     06/10/17 1734   06/10/17 0000  Call MD for:  persistant dizziness or light-headedness     06/10/17 1734   06/10/17 0000  Call MD for:  extreme fatigue     06/10/17 1734   06/10/17 0000  (HEART FAILURE PATIENTS) Call MD:  Anytime you have any of the following symptoms: 1) 3 pound weight gain in 24 hours or 5 pounds in 1 week 2) shortness of breath, with or without a dry hacking cough 3) swelling in the hands, feet or stomach 4) if you have to sleep on extra pillows at night in order to breathe.     06/10/17 1734     Follow-up Information    Shirline Frees, MD. Call.   Specialty:  Family Medicine Why:  Call to schedule an appointment within 1 week Contact information: [place] [address] [phone]        Lorretta Harp, MD. Call.   Specialties:  Cardiology, Radiology Why:  Follow up for Heart Failure Contact information: [address] [place]  [address] [phone]        Estanislado Emms, MD. Call.   Specialty:  Nephrology Why:  Follow up within 1-2 weeks  Contact information: [place] [address] [phone]          No Known Allergies  Consultations:  Cardiology  Nephrology  Procedures/Studies: Dg Chest 1 View  Result Date: 06/09/2017 CLINICAL DATA:  SOB/ Wheeze/ congestion EXAM: CHEST 1 VIEW COMPARISON:  06/07/2017 FINDINGS: Mild enlargement of the cardiopericardial silhouette. Indistinct bibasilar airspace opacities. The interstitial accentuation shown on the prior exam is mildly improved although there still indistinctness of pulmonary vasculature. Atherosclerotic calcification of the aortic arch. IMPRESSION: 1. Nonspecific bibasilar airspace opacities, possibilities include atelectasis, pneumonia, aspiration pneumonitis, or basilar distribution of resolving edema. 2. Improved interstitial edema but continued pulmonary venous hypertension. Mild enlargement of the cardiopericardial silhouette. 3.  Aortic Atherosclerosis (ICD10-I70.0). Electronically Signed   By: Van Clines M.D.   On: 06/09/2017 09:45   Dg Chest 2 View  Result Date: 06/07/2017 CLINICAL DATA:  Shortness of breath EXAM: CHEST  2 VIEW COMPARISON:  05/14/2017, 04/27/2015 FINDINGS: Cardiomegaly evident with increased diffuse interstitial opacities compatible with edema. Minor associated bibasilar atelectasis. Trace pleural effusions. Pattern compatible with mild CHF. No pneumothorax. Trachea is midline. Degenerative changes noted of the spine. IMPRESSION: Mild CHF pattern . Electronically Signed   By: Jerilynn Mages.  Shick M.D.   On: 06/07/2017 13:41   Dg Chest 2 View  Result Date: 05/14/2017 CLINICAL DATA:  Altered mental status, confusion. History of diabetes and hypertension. EXAM: CHEST  2 VIEW COMPARISON:  Chest radiograph May 01, 2017 FINDINGS: Cardiac silhouette is mildly enlarged and unchanged. Mediastinal  silhouette is nonsuspicious. No pleural effusion or focal consolidation. No pneumothorax. Large body habitus. Osseous structures are unchanged. IMPRESSION: Stable cardiomegaly, no acute pulmonary process. Electronically Signed   By: Elon Alas M.D.   On: 05/14/2017 23:36   Ct Head Wo Contrast  Result Date: 05/14/2017 CLINICAL DATA:  Altered mental status today. History of hypertension, hyperlipidemia and diabetes. EXAM: CT HEAD WITHOUT CONTRAST TECHNIQUE: Contiguous axial images were obtained from the base of the skull through the vertex without intravenous contrast. COMPARISON:  MRI of the head August 09, 2016 FINDINGS: BRAIN: No intraparenchymal hemorrhage, mass effect nor midline shift. The ventricles and sulci are normal for age. Patchy supratentorial white matter hypodensities. No acute large vascular territory infarcts. No abnormal extra-axial fluid collections. Basal cisterns are patent. VASCULAR: Mild calcific atherosclerosis of the carotid siphons. SKULL: No skull fracture. No significant scalp soft tissue swelling. SINUSES/ORBITS: The mastoid air-cells and included paranasal sinuses are well-aerated.The included ocular globes and orbital contents are non-suspicious. Status post bilateral ocular lens implants. OTHER: None. IMPRESSION: 1. No acute intracranial process. 2. Moderate matter changes compatible with chronic small vessel ischemic disease, advanced for age and, from prior MRI given differences in imaging technique. Electronically Signed   By: Elon Alas M.D.   On: 05/14/2017 23:39   US Renal  Result Date: 05/16/2017 CLINICAL DATA:  Acute renal failure, history  diabetes mellitus, hypertension EXAM: RENAL / URINARY TRACT ULTRASOUND COMPLETE COMPARISON:  CT abdomen and pelvis 05/01/2017 FINDINGS: Right Kidney: Length: 14.9 cm. Normal cortical thickness. Borderline increased cortical echogenicity. No mass, hydronephrosis or shadowing calcification. Left Kidney: Length: 13.4 cm.  Normal cortical thickness. Slightly increased cortical echogenicity. No mass, hydronephrosis or shadowing calcification. Bladder: Partially distended, unremarkable. Incidentally noted echogenic hepatic parenchyma question fatty infiltration though this can be seen with cirrhosis and certain infiltrative disorders. IMPRESSION: Question medical renal disease changes. No evidence of renal mass or hydronephrosis. Possible fatty infiltration liver as discussed above. Electronically Signed   By: Lavonia Dana M.D.   On: 05/16/2017 09:38     Subjective: Seen and examined and was doing well. Stated SOB resolved and she felt her leg swelling was improved. No CP or lightheadedness. Ready to go home.   Discharge Exam: Vitals:   06/10/17 0629 06/10/17 1310  BP: 135/60 134/72  Pulse: 67 68  Resp: 18 18  Temp: 98.2 F (36.8 C) 98 F (36.7 C)  SpO2:  98%   Vitals:   06/09/17 1152 06/09/17 2052 06/10/17 0629 06/10/17 1310  BP: (!) 153/64 (!) 140/58 135/60 134/72  Pulse: 69 68 67 68  Resp: 20 18 18 18   Temp: 98.2 F (36.8 C) 98.4 F (36.9 C) 98.2 F (36.8 C) 98 F (36.7 C)  TempSrc: Oral Oral Oral Oral  SpO2: 96% 92%  98%  Weight:   107.6 kg (237 lb 3.2 oz)   Height:       General: Pt is alert, awake, not in acute distress Cardiovascular: RRR, S1/S2 +, no rubs, no gallops Respiratory: CTA bilaterally, no wheezing, no rhonchi Abdominal: Soft, NT, ND, bowel sounds + Extremities: 1+ LE edema, no cyanosis  The results of significant diagnostics from this hospitalization (including imaging, microbiology, ancillary and laboratory) are listed below for reference.    Microbiology: No results found for this or any previous visit (from the past 240 hour(s)).   Labs: BNP (last 3 results)  Recent Labs  06/07/17 1317  BNP 341.9*   Basic Metabolic Panel:  Recent Labs Lab 06/07/17 1317 06/08/17 0157 06/09/17 0330 06/10/17 0606  NA 137 141 141 142  K 4.5 4.1 3.9 4.5  CL 104 106 106 104   CO2 25 24 27 30   GLUCOSE 207* 168* 174* 116*  BUN 39* 36* 37* 40*  CREATININE 2.63* 2.53* 2.52* 2.60*  CALCIUM 8.2* 8.1* 8.1* 8.3*  MG  --  2.5* 2.3 2.2  PHOS  --  5.6* 5.0* 5.6*   Liver Function Tests:  Recent Labs Lab 06/08/17 0157 06/09/17 0330 06/10/17 0606  AST 39 33 24  ALT 96* 74* 57*  ALKPHOS 200* 196* 186*  BILITOT 0.4 0.4 0.3  PROT 6.3* 6.3* 6.5  ALBUMIN 2.9* 2.8* 2.9*   No results for input(s): LIPASE, AMYLASE in the last 168 hours. No results for input(s): AMMONIA in the last 168 hours. CBC:  Recent Labs Lab 06/07/17 1317 06/08/17 0157 06/09/17 0330 06/10/17 0606  WBC 5.5 5.6 5.5 5.8  NEUTROABS  --  2.9 2.5 2.4  HGB 9.6* 9.1* 8.9* 9.4*  HCT 30.1* 28.2* 28.2* 29.9*  MCV 87.0 86.5 88.1 89.0  PLT 338 330 340 359   Cardiac Enzymes:  Recent Labs Lab 06/07/17 2057 06/08/17 0157 06/08/17 1058  TROPONINI <0.03 <0.03 <0.03   BNP: Invalid input(s): POCBNP CBG:  Recent Labs Lab 06/10/17 0252 06/10/17 0554 06/10/17 0807 06/10/17 1149 06/10/17 1612  GLUCAP 151* 128* 94 161*  239*   D-Dimer No results for input(s): DDIMER in the last 72 hours. Hgb A1c  Recent Labs  06/08/17 0157 06/10/17 0610  HGBA1C 12.2*  QUESTIONABLE RESULTS, RECOMMEND RECOLLECT TO VERIFY 11.2*   Lipid Profile  Recent Labs  06/08/17 0157  CHOL 116  HDL 38*  LDLCALC 55  TRIG 114  CHOLHDL 3.1   Thyroid function studies No results for input(s): TSH, T4TOTAL, T3FREE, THYROIDAB in the last 72 hours.  Invalid input(s): FREET3 Anemia work up No results for input(s): VITAMINB12, FOLATE, FERRITIN, TIBC, IRON, RETICCTPCT in the last 72 hours. Urinalysis    Component Value Date/Time   COLORURINE COLORLESS (A) 06/07/2017 2032   APPEARANCEUR CLEAR 06/07/2017 2032   LABSPEC 1.005 06/07/2017 2032   PHURINE 5.0 06/07/2017 2032   GLUCOSEU NEGATIVE 06/07/2017 2032   HGBUR SMALL (A) 06/07/2017 2032   BILIRUBINUR NEGATIVE 06/07/2017 2032   KETONESUR NEGATIVE 06/07/2017  2032   PROTEINUR 100 (A) 06/07/2017 2032   UROBILINOGEN 0.2 02/14/2012 1639   NITRITE NEGATIVE 06/07/2017 2032   LEUKOCYTESUR NEGATIVE 06/07/2017 2032   Sepsis Labs Invalid input(s): PROCALCITONIN,  WBC,  LACTICIDVEN Microbiology No results found for this or any previous visit (from the past 240 hour(s)).  Time coordinating discharge: 35 minutes  SIGNED:  Kerney Elbe, DO [facility] 06/11/2017, 12:19 AM Pager [phone]  If 7PM-7AM, please contact night-coverage [email or web address] Password [email or web address]

## 2017-10-15 DIAGNOSIS — J209 Acute bronchitis, unspecified: Secondary | ICD-10-CM | POA: Diagnosis not present

## 2017-10-31 DIAGNOSIS — H4313 Vitreous hemorrhage, bilateral: Secondary | ICD-10-CM | POA: Diagnosis not present

## 2017-10-31 DIAGNOSIS — E113511 Type 2 diabetes mellitus with proliferative diabetic retinopathy with macular edema, right eye: Secondary | ICD-10-CM | POA: Diagnosis not present

## 2017-11-14 DIAGNOSIS — E113513 Type 2 diabetes mellitus with proliferative diabetic retinopathy with macular edema, bilateral: Secondary | ICD-10-CM | POA: Diagnosis not present

## 2017-11-14 DIAGNOSIS — H4311 Vitreous hemorrhage, right eye: Secondary | ICD-10-CM | POA: Diagnosis not present

## 2017-11-15 IMAGING — US US RENAL
1 series · 14 of 25 positions shown · non-contrast
Comparison: CT abdomen and pelvis 05/01/2017

CLINICAL DATA: Acute renal failure, history diabetes mellitus,
hypertension

EXAM:
RENAL / URINARY TRACT ULTRASOUND COMPLETE

[Series 1: us renal · 0.25mm/px · 14 of 33 slices shown]
[im 1/33]
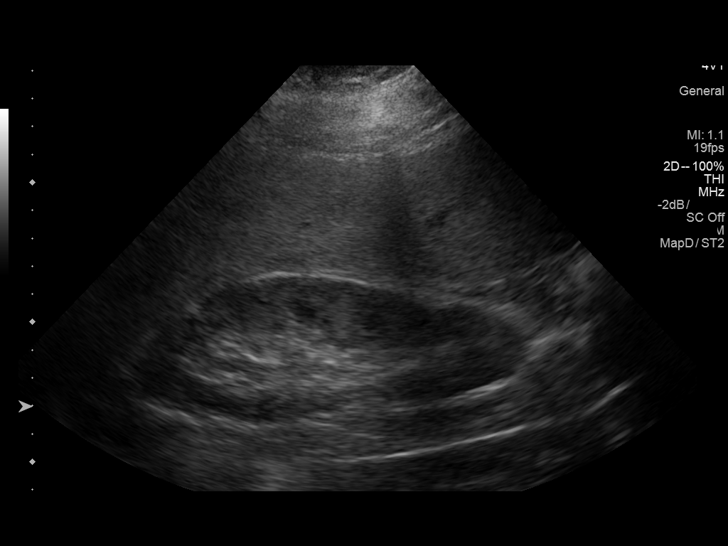
[im 3/33]
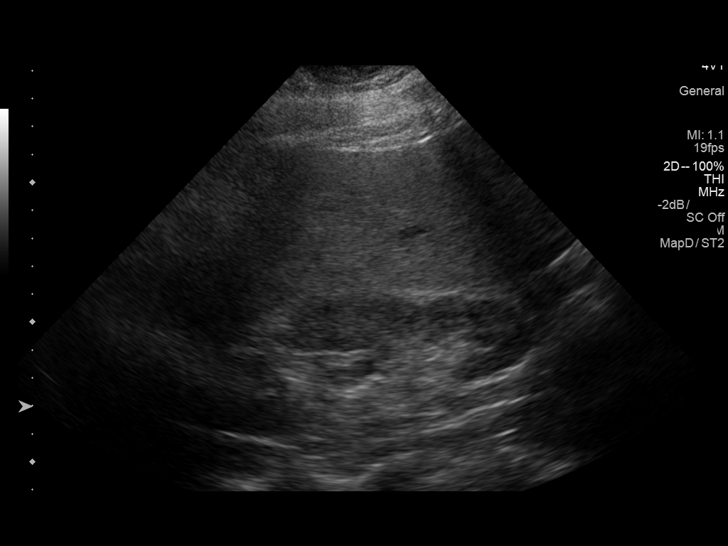
[im 6/33]
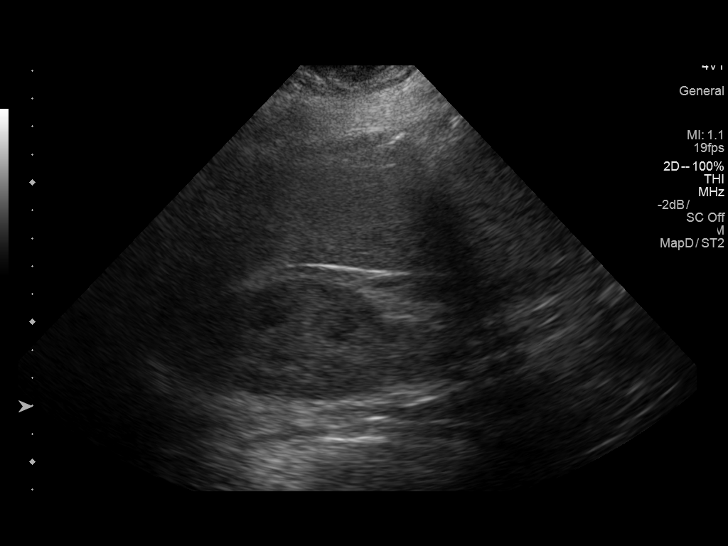
[im 9/33]
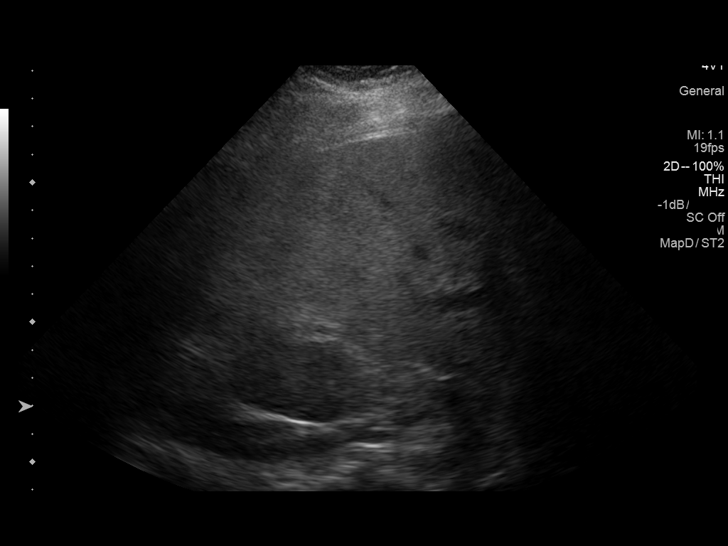
[im 11/33]
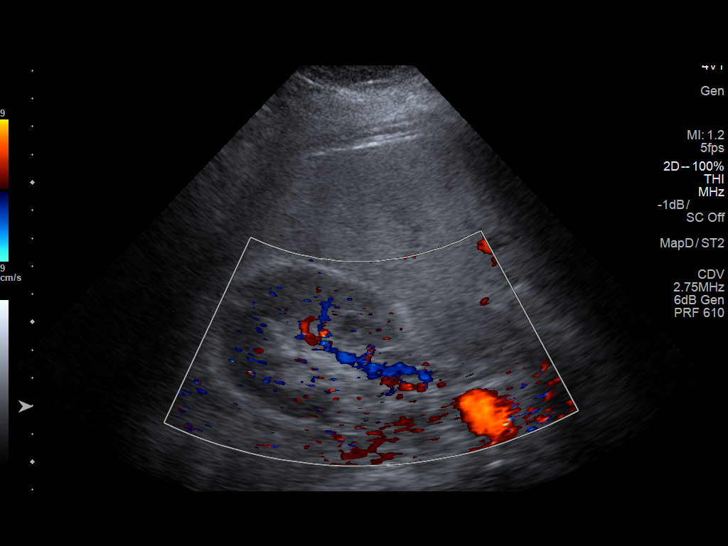
[im 13/33]
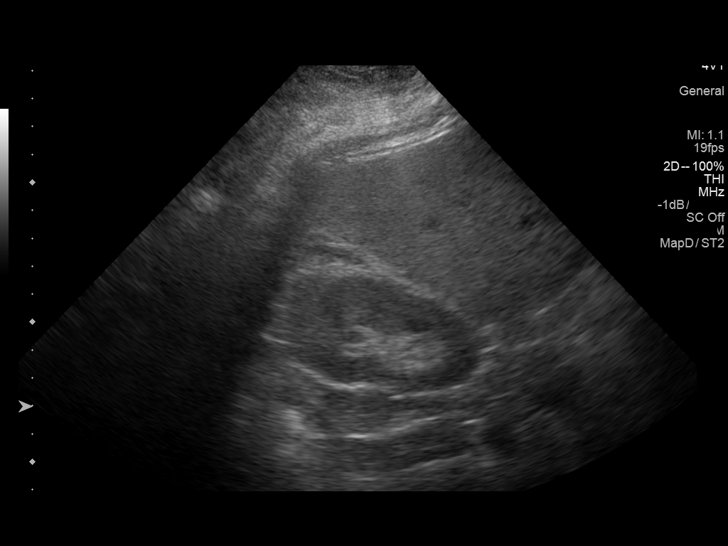
[im 15/33]
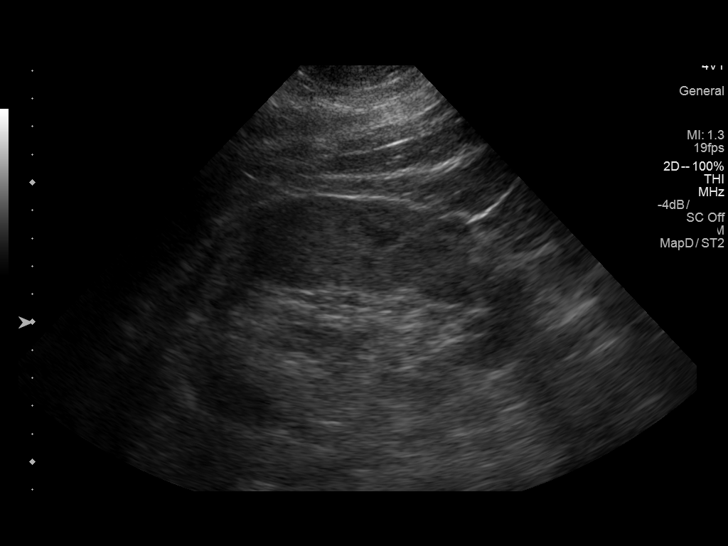
[im 18/33]
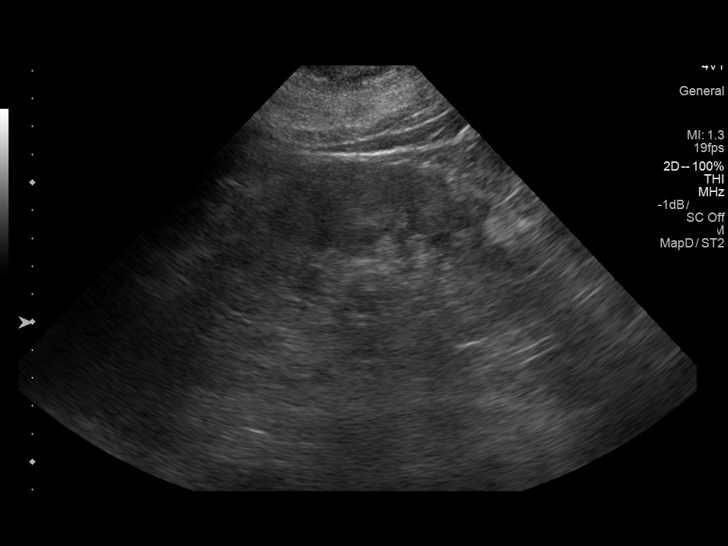
[im 21/33]
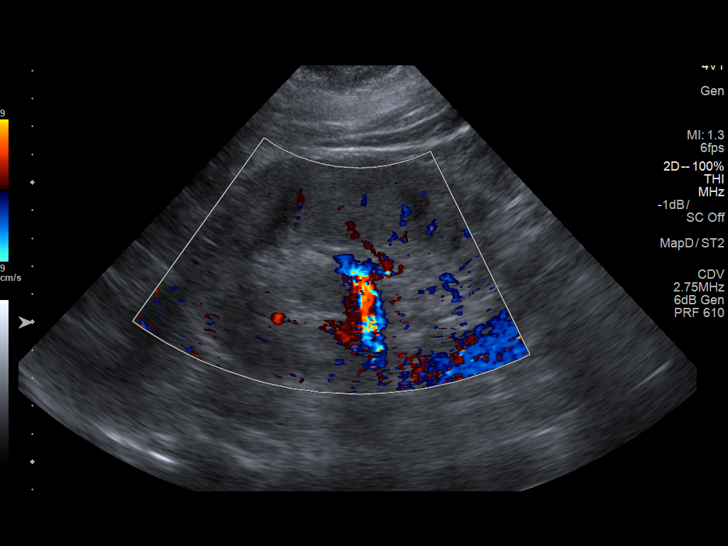
[im 22/33]
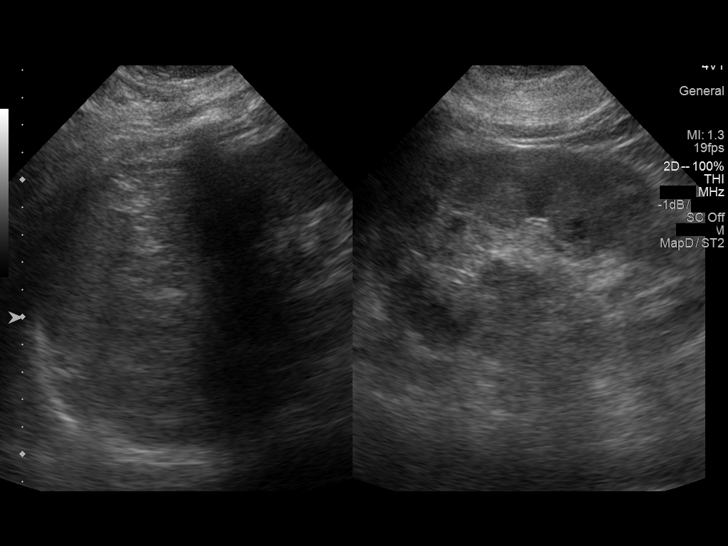
[im 25/33]
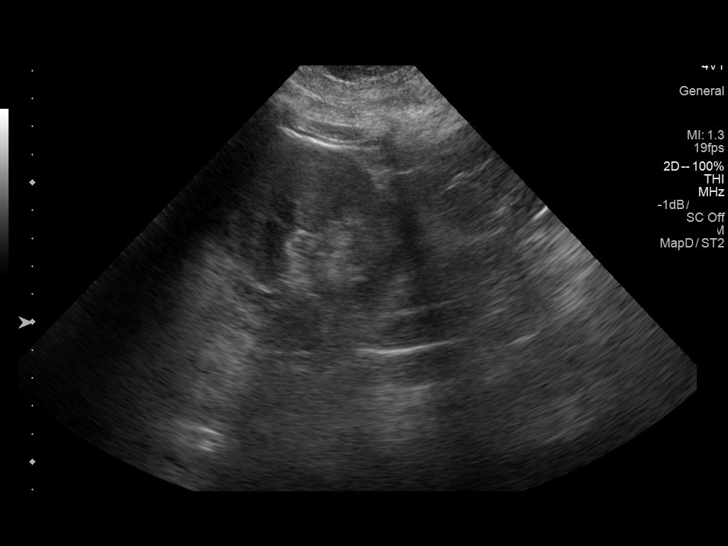
[im 27/33]
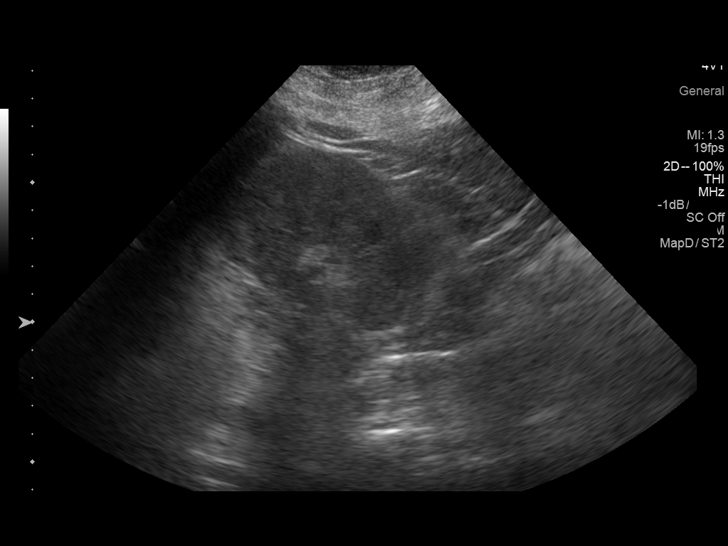
[im 30/33]
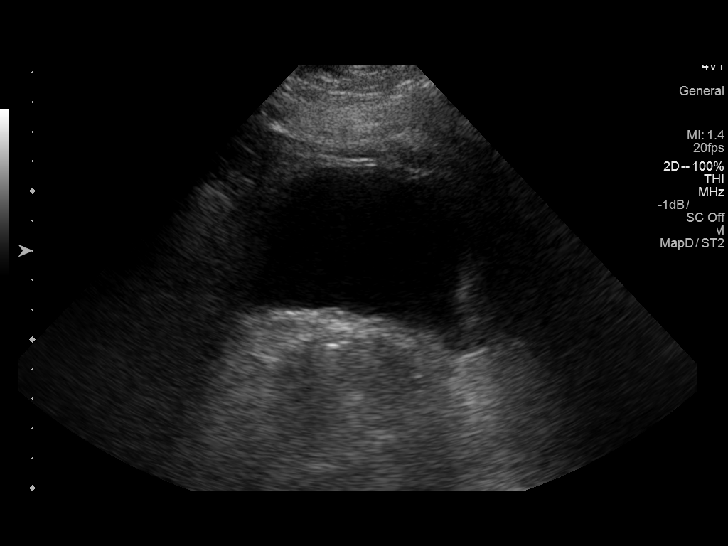
[im 33/33]
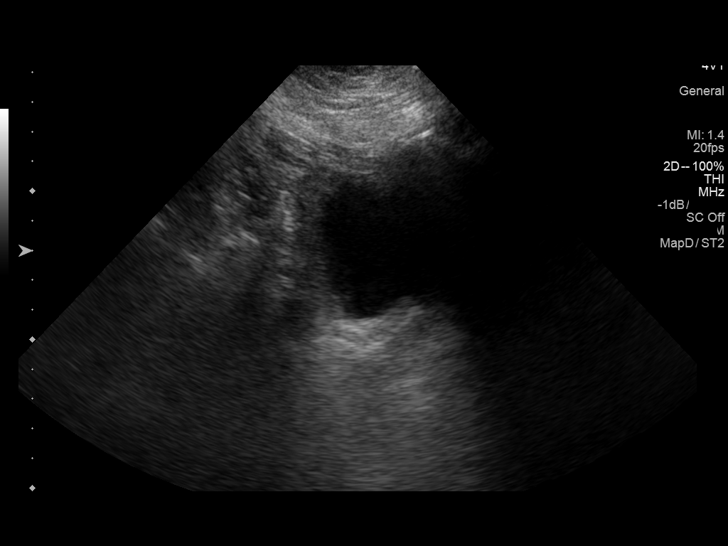

[14 of 25 positions shown; findings below may reference images not displayed]

FINDINGS: Right Kidney:

Length: 14.9 cm. Normal cortical thickness. Borderline increased
cortical echogenicity. No mass, hydronephrosis or shadowing
calcification.

Left Kidney:

Length: 13.4 cm. Normal cortical thickness. Slightly increased
cortical echogenicity. No mass, hydronephrosis or shadowing
calcification.

Bladder:

Partially distended, unremarkable.

Incidentally noted echogenic hepatic parenchyma question fatty
infiltration though this can be seen with cirrhosis and certain
infiltrative disorders.
IMPRESSION: Question medical renal disease changes.

No evidence of renal mass or hydronephrosis.

Possible fatty infiltration liver as discussed above.

## 2017-11-27 DIAGNOSIS — I129 Hypertensive chronic kidney disease with stage 1 through stage 4 chronic kidney disease, or unspecified chronic kidney disease: Secondary | ICD-10-CM | POA: Insufficient documentation

## 2017-11-27 DIAGNOSIS — E1169 Type 2 diabetes mellitus with other specified complication: Secondary | ICD-10-CM | POA: Diagnosis not present

## 2017-11-27 DIAGNOSIS — E1149 Type 2 diabetes mellitus with other diabetic neurological complication: Secondary | ICD-10-CM | POA: Diagnosis not present

## 2017-11-27 DIAGNOSIS — E1122 Type 2 diabetes mellitus with diabetic chronic kidney disease: Secondary | ICD-10-CM | POA: Diagnosis not present

## 2017-11-27 DIAGNOSIS — E11319 Type 2 diabetes mellitus with unspecified diabetic retinopathy without macular edema: Secondary | ICD-10-CM | POA: Diagnosis not present

## 2017-12-26 DIAGNOSIS — E1165 Type 2 diabetes mellitus with hyperglycemia: Secondary | ICD-10-CM | POA: Diagnosis not present

## 2017-12-26 DIAGNOSIS — N184 Chronic kidney disease, stage 4 (severe): Secondary | ICD-10-CM | POA: Diagnosis not present

## 2017-12-26 DIAGNOSIS — R600 Localized edema: Secondary | ICD-10-CM | POA: Diagnosis not present

## 2017-12-26 DIAGNOSIS — E7800 Pure hypercholesterolemia, unspecified: Secondary | ICD-10-CM | POA: Diagnosis not present

## 2017-12-26 DIAGNOSIS — I10 Essential (primary) hypertension: Secondary | ICD-10-CM | POA: Diagnosis not present

## 2018-01-22 DIAGNOSIS — E11319 Type 2 diabetes mellitus with unspecified diabetic retinopathy without macular edema: Secondary | ICD-10-CM | POA: Diagnosis not present

## 2018-01-22 DIAGNOSIS — E1149 Type 2 diabetes mellitus with other diabetic neurological complication: Secondary | ICD-10-CM | POA: Diagnosis not present

## 2018-01-22 DIAGNOSIS — E1169 Type 2 diabetes mellitus with other specified complication: Secondary | ICD-10-CM | POA: Diagnosis not present

## 2018-01-22 DIAGNOSIS — E1165 Type 2 diabetes mellitus with hyperglycemia: Secondary | ICD-10-CM | POA: Diagnosis not present

## 2018-01-22 DIAGNOSIS — E1122 Type 2 diabetes mellitus with diabetic chronic kidney disease: Secondary | ICD-10-CM | POA: Diagnosis not present

## 2018-01-22 DIAGNOSIS — Z794 Long term (current) use of insulin: Secondary | ICD-10-CM | POA: Diagnosis not present

## 2018-02-25 DIAGNOSIS — E8770 Fluid overload, unspecified: Secondary | ICD-10-CM | POA: Diagnosis not present

## 2018-02-25 DIAGNOSIS — N183 Chronic kidney disease, stage 3 (moderate): Secondary | ICD-10-CM | POA: Diagnosis not present

## 2018-02-25 DIAGNOSIS — I129 Hypertensive chronic kidney disease with stage 1 through stage 4 chronic kidney disease, or unspecified chronic kidney disease: Secondary | ICD-10-CM | POA: Diagnosis not present

## 2018-02-25 DIAGNOSIS — E1129 Type 2 diabetes mellitus with other diabetic kidney complication: Secondary | ICD-10-CM | POA: Diagnosis not present

## 2018-03-07 ENCOUNTER — Other Ambulatory Visit: Payer: Self-pay

## 2018-03-07 ENCOUNTER — Observation Stay (HOSPITAL_COMMUNITY)
Admission: EM | Admit: 2018-03-07 | Discharge: 2018-03-10 | Disposition: A | Discharge status: Home / Self Care | Payer: BLUE CROSS/BLUE SHIELD | Attending: Internal Medicine | Admitting: Internal Medicine

## 2018-03-07 ENCOUNTER — Encounter (HOSPITAL_COMMUNITY): Payer: Self-pay

## 2018-03-07 ENCOUNTER — Emergency Department (HOSPITAL_COMMUNITY): Payer: BLUE CROSS/BLUE SHIELD

## 2018-03-07 DIAGNOSIS — R9439 Abnormal result of other cardiovascular function study: Secondary | ICD-10-CM | POA: Insufficient documentation

## 2018-03-07 DIAGNOSIS — N184 Chronic kidney disease, stage 4 (severe): Secondary | ICD-10-CM | POA: Diagnosis not present

## 2018-03-07 DIAGNOSIS — E785 Hyperlipidemia, unspecified: Secondary | ICD-10-CM | POA: Insufficient documentation

## 2018-03-07 DIAGNOSIS — B029 Zoster without complications: Secondary | ICD-10-CM | POA: Diagnosis not present

## 2018-03-07 DIAGNOSIS — R079 Chest pain, unspecified: Secondary | ICD-10-CM | POA: Diagnosis present

## 2018-03-07 DIAGNOSIS — Z6841 Body Mass Index (BMI) 40.0 and over, adult: Secondary | ICD-10-CM | POA: Diagnosis not present

## 2018-03-07 DIAGNOSIS — E119 Type 2 diabetes mellitus without complications: Secondary | ICD-10-CM

## 2018-03-07 DIAGNOSIS — R0789 Other chest pain: Secondary | ICD-10-CM | POA: Diagnosis not present

## 2018-03-07 DIAGNOSIS — I10 Essential (primary) hypertension: Secondary | ICD-10-CM | POA: Diagnosis present

## 2018-03-07 DIAGNOSIS — Z794 Long term (current) use of insulin: Secondary | ICD-10-CM | POA: Diagnosis not present

## 2018-03-07 DIAGNOSIS — D638 Anemia in other chronic diseases classified elsewhere: Secondary | ICD-10-CM | POA: Insufficient documentation

## 2018-03-07 DIAGNOSIS — E1122 Type 2 diabetes mellitus with diabetic chronic kidney disease: Secondary | ICD-10-CM | POA: Insufficient documentation

## 2018-03-07 DIAGNOSIS — I5032 Chronic diastolic (congestive) heart failure: Secondary | ICD-10-CM | POA: Diagnosis not present

## 2018-03-07 DIAGNOSIS — I130 Hypertensive heart and chronic kidney disease with heart failure and stage 1 through stage 4 chronic kidney disease, or unspecified chronic kidney disease: Secondary | ICD-10-CM | POA: Insufficient documentation

## 2018-03-07 DIAGNOSIS — Z79899 Other long term (current) drug therapy: Secondary | ICD-10-CM | POA: Diagnosis not present

## 2018-03-07 HISTORY — DX: Chronic kidney disease, stage 3 (moderate): N18.3

## 2018-03-07 LAB — POCT I-STAT TROPONIN I: TROPONIN I, POC: 0.01 ng/mL (ref 0.00–0.08)

## 2018-03-07 LAB — BASIC METABOLIC PANEL
ANION GAP: 10 (ref 5–15)
BUN: 51 mg/dL — AB (ref 6–20)
CHLORIDE: 101 mmol/L (ref 101–111)
CO2: 27 mmol/L (ref 22–32)
Calcium: 9.1 mg/dL (ref 8.9–10.3)
Creatinine, Ser: 2.17 mg/dL — ABNORMAL HIGH (ref 0.44–1.00)
GFR calc Af Amer: 27 mL/min — ABNORMAL LOW (ref >60)
GFR, EST NON AFRICAN AMERICAN: 23 mL/min — AB (ref >60)
GLUCOSE: 313 mg/dL — AB (ref 65–99)
POTASSIUM: 4.3 mmol/L (ref 3.5–5.1)
SODIUM: 138 mmol/L (ref 135–145)

## 2018-03-07 LAB — HEPATIC FUNCTION PANEL
ALBUMIN: 3.5 g/dL (ref 3.5–5.0)
ALK PHOS: 93 U/L (ref 38–126)
ALT: 24 U/L (ref 14–54)
AST: 20 U/L (ref 15–41)
Bilirubin, Direct: 0.2 mg/dL (ref 0.1–0.5)
Indirect Bilirubin: 0.4 mg/dL (ref 0.3–0.9)
TOTAL PROTEIN: 7.5 g/dL (ref 6.5–8.1)
Total Bilirubin: 0.6 mg/dL (ref 0.3–1.2)

## 2018-03-07 LAB — GLUCOSE, CAPILLARY: GLUCOSE-CAPILLARY: 259 mg/dL — AB (ref 65–99)

## 2018-03-07 LAB — CBC
HEMATOCRIT: 34.8 % — AB (ref 36.0–46.0)
HEMOGLOBIN: 11.1 g/dL — AB (ref 12.0–15.0)
MCH: 26.4 pg (ref 26.0–34.0)
MCHC: 31.9 g/dL (ref 30.0–36.0)
MCV: 82.7 fL (ref 78.0–100.0)
Platelets: 270 10*3/uL (ref 150–400)
RBC: 4.21 MIL/uL (ref 3.87–5.11)
RDW: 13.5 % (ref 11.5–15.5)
WBC: 5.1 10*3/uL (ref 4.0–10.5)

## 2018-03-07 LAB — TROPONIN I
Troponin I: <0.03 ng/mL (ref <0.03)
Troponin I: <0.03 ng/mL (ref <0.03)

## 2018-03-07 LAB — I-STAT TROPONIN, ED: Troponin i, poc: 0.01 ng/mL (ref 0.00–0.08)

## 2018-03-07 LAB — CBG MONITORING, ED: Glucose-Capillary: 292 mg/dL — ABNORMAL HIGH (ref 65–99)

## 2018-03-07 MED ORDER — HEPARIN SODIUM (PORCINE) 5000 UNIT/ML IJ SOLN
5000.0000 [IU] | Freq: Three times a day (TID) | INTRAMUSCULAR | Status: DC
  Administered 2018-03-07 – 2018-03-08 (×4): 5000 [IU] via SUBCUTANEOUS
  Filled 2018-03-07 (×4): qty 1

## 2018-03-07 MED ORDER — CARVEDILOL 12.5 MG PO TABS
12.5000 mg | ORAL_TABLET | Freq: Two times a day (BID) | ORAL | Status: DC
  Administered 2018-03-07: 12.5 mg via ORAL
  Filled 2018-03-07: qty 1

## 2018-03-07 MED ORDER — ALUM & MAG HYDROXIDE-SIMETH 200-200-20 MG/5ML PO SUSP: 30.0000 mL | Freq: Four times a day (QID) | ORAL | Status: DC | PRN

## 2018-03-07 MED ORDER — FAMOTIDINE 20 MG PO TABS
20.0000 mg | ORAL_TABLET | Freq: Once | ORAL | Status: AC
  Administered 2018-03-07: 20 mg via ORAL
  Filled 2018-03-07: qty 1

## 2018-03-07 MED ORDER — ATORVASTATIN CALCIUM 40 MG PO TABS
40.0000 mg | ORAL_TABLET | Freq: Every day | ORAL | Status: DC
  Administered 2018-03-07 – 2018-03-10 (×4): 40 mg via ORAL
  Filled 2018-03-07 (×4): qty 1

## 2018-03-07 MED ORDER — ONDANSETRON HCL 4 MG/2ML IJ SOLN: 4.0000 mg | Freq: Four times a day (QID) | INTRAMUSCULAR | Status: DC | PRN

## 2018-03-07 MED ORDER — FENTANYL CITRATE (PF) 100 MCG/2ML IJ SOLN
50.0000 ug | INTRAMUSCULAR | Status: DC | PRN
Start: 2018-03-07 — End: 2018-03-10
  Administered 2018-03-07 – 2018-03-10 (×8): 50 ug via INTRAVENOUS
  Filled 2018-03-07 (×8): qty 2

## 2018-03-07 MED ORDER — GABAPENTIN 100 MG PO CAPS
100.0000 mg | ORAL_CAPSULE | Freq: Three times a day (TID) | ORAL | Status: DC
Start: 2018-03-07 — End: 2018-03-10
  Administered 2018-03-07 – 2018-03-10 (×8): 100 mg via ORAL
  Filled 2018-03-07 (×8): qty 1

## 2018-03-07 MED ORDER — ACETAMINOPHEN 325 MG PO TABS
650.0000 mg | ORAL_TABLET | ORAL | Status: DC | PRN
  Administered 2018-03-08: 650 mg via ORAL
  Filled 2018-03-07: qty 2

## 2018-03-07 MED ORDER — INSULIN NPH (HUMAN) (ISOPHANE) 100 UNIT/ML ~~LOC~~ SUSP
20.0000 [IU] | Freq: Two times a day (BID) | SUBCUTANEOUS | Status: DC
  Administered 2018-03-07 – 2018-03-10 (×5): 20 [IU] via SUBCUTANEOUS
  Filled 2018-03-07 (×3): qty 10

## 2018-03-07 MED ORDER — ASPIRIN 81 MG PO CHEW
324.0000 mg | CHEWABLE_TABLET | Freq: Once | ORAL | Status: AC
  Administered 2018-03-07: 324 mg via ORAL
  Filled 2018-03-07: qty 4

## 2018-03-07 MED ORDER — ALBUTEROL SULFATE (2.5 MG/3ML) 0.083% IN NEBU: 2.5000 mg | INHALATION_SOLUTION | Freq: Four times a day (QID) | RESPIRATORY_TRACT | Status: DC | PRN

## 2018-03-07 MED ORDER — AMLODIPINE BESYLATE 10 MG PO TABS
10.0000 mg | ORAL_TABLET | Freq: Every day | ORAL | Status: DC
Start: 2018-03-08 — End: 2018-03-10
  Administered 2018-03-08 – 2018-03-10 (×3): 10 mg via ORAL
  Filled 2018-03-07 (×3): qty 1

## 2018-03-07 MED ORDER — INSULIN ASPART 100 UNIT/ML ~~LOC~~ SOLN
0.0000 [IU] | SUBCUTANEOUS | Status: DC
  Administered 2018-03-07: 5 [IU] via SUBCUTANEOUS
  Administered 2018-03-08: 3 [IU] via SUBCUTANEOUS
  Administered 2018-03-08: 1 [IU] via SUBCUTANEOUS
  Administered 2018-03-08: 2 [IU] via SUBCUTANEOUS
  Administered 2018-03-08: 9 [IU] via SUBCUTANEOUS

## 2018-03-07 MED ORDER — NITROGLYCERIN 0.4 MG SL SUBL
0.4000 mg | SUBLINGUAL_TABLET | SUBLINGUAL | Status: DC | PRN
  Administered 2018-03-07 – 2018-03-08 (×5): 0.4 mg via SUBLINGUAL
  Filled 2018-03-07 (×3): qty 1

## 2018-03-07 MED ORDER — ZOLPIDEM TARTRATE 5 MG PO TABS
5.0000 mg | ORAL_TABLET | Freq: Once | ORAL | Status: AC
  Administered 2018-03-07: 5 mg via ORAL
  Filled 2018-03-07: qty 1

## 2018-03-07 NOTE — ED Triage Notes (Signed)
Onset yesterday at noon left chest pain, abd pain, back pain.  Pt took Mylanta, baking soda and water, then Zantac and ibuprofen.  Back and abd pain resolved.  Left sided chest pain is constant pressure, with intermittant sharp pains.

## 2018-03-07 NOTE — ED Provider Notes (Signed)
[facility]E PROGRESSIVE CARE Provider Note   CSN: [id number] Arrival date & time: 03/07/18  1546     History   Chief Complaint Chief Complaint  Patient presents with  . Chest Pain    HPI Latasha [name] is a 62 y.o. female.  Patient with kidney disease, obesity, diabetes presents with constant chest pressure and intermittent sharp left chest pain with intermittent back pain. No chest pain radiating specifically to the back. Patient denies blood clot risk factors. Patient has chest pressure currently 8 out of 10. Patient thought at first it was her reflux however it is worse in slightly different. Patient's had symptoms now for almost 3 days.     Past Medical History:  Diagnosis Date  . BPPV (benign paroxysmal positional vertigo)   . Chronic congestive heart failure with left ventricular diastolic dysfunction ([place])   . CKD (chronic kidney disease), stage III ([place]) 05/15/2017  . Diabetes mellitus   . Hyperlipidemia   . Hypertension   . Left atrial enlargement   . Obesity   . Palpitations     Patient Active Problem List   Diagnosis Date Noted  . Chest pain, rule out acute myocardial infarction 03/07/2018  . CKD (chronic kidney disease) stage 4, GFR 15-29 ml/min (HCC) 03/07/2018  . Hyperphosphatemia 06/09/2017  . Elevated ALT measurement 06/08/2017  . Hypertensive heart disease with acute on chronic diastolic congestive heart failure ([place])   . Bilateral leg edema   . Chronic diastolic CHF (congestive heart failure) ([place]) 06/07/2017  . Acute on chronic diastolic CHF (congestive heart failure) ([place]) 06/07/2017  . Acute respiratory failure with hypoxia ([place]) 06/07/2017  . ARF (acute renal failure) ([place])   . Encephalopathy acute 05/15/2017  . Hyperosmolar non-ketotic state in patient with type 2 diabetes mellitus ([place]) 05/15/2017  . Chronic pain 05/15/2017  . Acute kidney injury superimposed on chronic kidney disease ([place])   . Hyperlipemia 01/19/2014  . Palpitations  01/19/2014  . PVC's (premature ventricular contractions) 01/19/2014  . SCIATICA, RIGHT 12/12/2010  . GASTROESOPHAGEAL REFLUX DISEASE 02/09/2010  . LEG CRAMPS 02/09/2010  . Morbid obesity ([place]) 12/01/2009  . VITAMIN D DEFICIENCY 10/13/2009  . CONSTIPATION 10/13/2009  . LIVER FUNCTION TESTS, ABNORMAL, HX OF 10/13/2009  . Essential hypertension 08/24/2009  . SHOULDER PAIN, RIGHT, CHRONIC 08/24/2009  . Insulin-requiring or dependent type II diabetes mellitus ([place]) 10/08/1991    Past Surgical History:  Procedure Laterality Date  . ABDOMINAL HYSTERECTOMY    . EYE SURGERY     cataract/left eye, right eye had bleeding in back  . KNEE SURGERY    . TRIGGER FINGER RELEASE       OB History   None      Home Medications    Prior to Admission medications   Medication Sig Start Date End Date Taking? Authorizing Provider  albuterol (PROVENTIL HFA;VENTOLIN HFA) 108 (90 Base) MCG/ACT inhaler Inhale 2 puffs into the lungs every 6 (six) hours as needed for wheezing or shortness of breath.   Yes [provider]  alum & mag hydroxide-simeth (MAALOX/MYLANTA) 200-200-20 MG/5ML suspension Take 30 mLs by mouth every 6 (six) hours as needed for indigestion or heartburn.   Yes [provider]  amLODipine (NORVASC) 10 MG tablet Take 1 tablet (10 mg total) by mouth daily. 05/18/17  Yes Eugenie Filler, MD  atorvastatin (LIPITOR) 40 MG tablet Take 40 mg by mouth daily.    Yes [provider]  carvedilol (COREG) 12.5 MG tablet Take 1 tablet (12.5  mg total) by mouth 2 (two) times daily with a meal. 06/10/17  Yes Sheikh, Omair Latif, DO  furosemide (LASIX) 40 MG tablet Take 1 tablet (40 mg total) by mouth every evening. 06/10/17  Yes Sheikh, Omair Latif, DO  furosemide (LASIX) 80 MG tablet Take 1 tablet (80 mg total) by mouth every morning. 06/11/17  Yes Sheikh, Omair Latif, DO  gabapentin (NEURONTIN) 100 MG capsule Take 100 mg by mouth 3 (three) times daily.    Yes [provider]  insulin NPH Human (HUMULIN N,NOVOLIN N) 100 UNIT/ML injection Inject 40 Units into the skin 2 (two) times daily before a meal.   Yes [provider]  potassium chloride SA (K-DUR,KLOR-CON) 20 MEQ tablet Take 1 tablet (20 mEq total) by mouth daily. 06/11/17  Yes Kerney Elbe, DO    Family History Family History  Problem Relation Age of Onset  . Heart disease Father   . Hypertension Father   . Alzheimer's disease Mother   . Diabetes Mother   . Diabetes Sister   . Dementia Other     Social History Social History   Tobacco Use  . Smoking status: Never Smoker  . Smokeless tobacco: Never Used  Substance Use Topics  . Alcohol use: Yes    Comment:  rare  . Drug use: No     Allergies   Patient has no known allergies.   Review of Systems Review of Systems  Constitutional: Negative for chills, diaphoresis and fever.  HENT: Negative for congestion.   Eyes: Negative for visual disturbance.  Respiratory: Negative for shortness of breath.   Cardiovascular: Positive for chest pain. Negative for leg swelling.  Gastrointestinal: Negative for abdominal pain and vomiting.  Genitourinary: Negative for dysuria and flank pain.  Musculoskeletal: Negative for back pain, neck pain and neck stiffness.  Skin: Negative for rash.  Neurological: Negative for light-headedness and headaches.     Physical Exam Updated Vital Signs BP (!) 155/85   Pulse 69   Temp 98.2 F (36.8 C)   Resp 13   Ht 5' 4.5" (1.638 m)   Wt 108.4 kg (239 lb)   SpO2 91%   BMI 40.39 kg/m   Physical Exam  Constitutional: She is oriented to person, place, and time. She appears well-developed and well-nourished.  HENT:  Head: Normocephalic and atraumatic.  Eyes: Conjunctivae are normal. Right eye exhibits no discharge. Left eye exhibits no discharge.  Neck: Normal range of motion. Neck supple. No tracheal deviation present.  Cardiovascular: Normal rate and regular rhythm.  Pulmonary/Chest:  Effort normal and breath sounds normal.  Abdominal: Soft. She exhibits no distension. There is no tenderness. There is no guarding.  Musculoskeletal: She exhibits no edema.  Neurological: She is alert and oriented to person, place, and time.  Skin: Skin is warm. No rash noted.  Psychiatric: She has a normal mood and affect.  Nursing note and vitals reviewed.    ED Treatments / Results  Labs (all labs ordered are listed, but only abnormal results are displayed) Labs Reviewed  BASIC METABOLIC PANEL - Abnormal; Notable for the following components:      Result Value   Glucose, Bld 313 (*)    BUN 51 (*)    Creatinine, Ser 2.17 (*)    GFR calc non Af Amer 23 (*)    GFR calc Af Amer 27 (*)    All other components within normal limits  CBC - Abnormal; Notable for the following components:   Hemoglobin 11.1 (*)  HCT 34.8 (*)    All other components within normal limits  CBG MONITORING, ED - Abnormal; Notable for the following components:   Glucose-Capillary 292 (*)    All other components within normal limits  HEPATIC FUNCTION PANEL  TROPONIN I  TROPONIN I  TROPONIN I  I-STAT TROPONIN, ED  I-STAT TROPONIN, ED  POCT I-STAT TROPONIN I    EKG EKG Interpretation  Date/Time:  Saturday March 07 2018 15:51:19 EDT Ventricular Rate:  67 PR Interval:  170 QRS Duration: 92 QT Interval:  426 QTC Calculation: 450 R Axis:   -12 Text Interpretation:  Normal sinus rhythm Minimal voltage criteria for LVH, may be normal variant Nonspecific T wave abnormality Abnormal ECG Confirmed by Elnora Morrison [id number]) on 03/07/2018 7:10:19 PM   Radiology Dg Chest 2 View  Result Date: 03/07/2018 CLINICAL DATA:  Left-sided chest pain for several hours EXAM: CHEST - 2 VIEW COMPARISON:  06/09/2017 FINDINGS: The heart size and mediastinal contours are within normal limits. Both lungs are clear. The visualized skeletal structures are unremarkable. IMPRESSION: No active cardiopulmonary disease. Electronically  Signed   By: Inez Catalina M.D.   On: 03/07/2018 16:32    Procedures Procedures (including critical care time)  Medications Ordered in ED Medications  fentaNYL (SUBLIMAZE) injection 50 mcg (50 mcg Intravenous Given 03/07/18 2002)  acetaminophen (TYLENOL) tablet 650 mg (has no administration in time range)  ondansetron (ZOFRAN) injection 4 mg (has no administration in time range)  heparin injection 5,000 Units (has no administration in time range)  nitroGLYCERIN (NITROSTAT) SL tablet 0.4 mg (0.4 mg Sublingual Given 03/07/18 2000)  albuterol (PROVENTIL) (2.5 MG/3ML) 0.083% nebulizer solution 2.5 mg (has no administration in time range)  alum & mag hydroxide-simeth (MAALOX/MYLANTA) 200-200-20 MG/5ML suspension 30 mL (has no administration in time range)  amLODipine (NORVASC) tablet 10 mg (has no administration in time range)  atorvastatin (LIPITOR) tablet 40 mg (has no administration in time range)  carvedilol (COREG) tablet 12.5 mg (has no administration in time range)  gabapentin (NEURONTIN) capsule 100 mg (has no administration in time range)  insulin NPH Human (HUMULIN N,NOVOLIN N) injection 20 Units (has no administration in time range)  insulin aspart (novoLOG) injection 0-9 Units (has no administration in time range)  aspirin chewable tablet 324 mg (324 mg Oral Given 03/07/18 2001)  famotidine (PEPCID) tablet 20 mg (20 mg Oral Given 03/07/18 2001)     Initial Impression / Assessment and Plan / ED Course  I have reviewed the triage vital signs and the nursing notes.  Pertinent labs & imaging results that were available during my care of the patient were reviewed by me and considered in my medical decision making (see chart for details).    Patient presents with worsening chest pressure for the past 2-3 days. With symptoms worsen her normal reflux and her multiple risk factors I do feel observation/admission for cardiac evaluation is indicated.  Blood work reviewed troponin negative other  blood work unremarkable. Pain meds and aspirin given. The patients results and plan were reviewed and discussed.   Any x-rays performed were independently reviewed by myself.   Differential diagnosis were considered with the presenting HPI.  Medications  fentaNYL (SUBLIMAZE) injection 50 mcg (50 mcg Intravenous Given 03/07/18 2002)  acetaminophen (TYLENOL) tablet 650 mg (has no administration in time range)  ondansetron (ZOFRAN) injection 4 mg (has no administration in time range)  heparin injection 5,000 Units (has no administration in time range)  nitroGLYCERIN (NITROSTAT) SL tablet 0.4 mg (0.4  mg Sublingual Given 03/07/18 2000)  albuterol (PROVENTIL) (2.5 MG/3ML) 0.083% nebulizer solution 2.5 mg (has no administration in time range)  alum & mag hydroxide-simeth (MAALOX/MYLANTA) 200-200-20 MG/5ML suspension 30 mL (has no administration in time range)  amLODipine (NORVASC) tablet 10 mg (has no administration in time range)  atorvastatin (LIPITOR) tablet 40 mg (has no administration in time range)  carvedilol (COREG) tablet 12.5 mg (has no administration in time range)  gabapentin (NEURONTIN) capsule 100 mg (has no administration in time range)  insulin NPH Human (HUMULIN N,NOVOLIN N) injection 20 Units (has no administration in time range)  insulin aspart (novoLOG) injection 0-9 Units (has no administration in time range)  aspirin chewable tablet 324 mg (324 mg Oral Given 03/07/18 2001)  famotidine (PEPCID) tablet 20 mg (20 mg Oral Given 03/07/18 2001)    Vitals:   03/07/18 1807 03/07/18 1930 03/07/18 1945 03/07/18 2015  BP: (!) 143/70 (!) 170/94 (!) 164/81 (!) 155/85  Pulse: 67 68 69   Resp: 18 20 13    Temp: 98.2 F (36.8 C)     TempSrc:      SpO2: 98% 93% 91%   Weight:      Height:        Final diagnoses:  Acute chest pain    Admission/ observation were discussed with the admitting physician, patient and/or family and they are comfortable with the plan.    Final Clinical  Impressions(s) / ED Diagnoses   Final diagnoses:  Acute chest pain    ED Discharge Orders    None       Elnora Morrison, MD 03/07/18 2054

## 2018-03-07 NOTE — H&P (Signed)
History and Physical    Latasha [name] IRC:[id number] DOB: 1955/12/12 DOA: 03/07/2018  PCP: Shirline Frees, MD  Patient coming from: Home  I have personally briefly reviewed patient's old medical records in [place]  Chief Complaint: Chest pain  HPI: Latasha [name] is a 62 y.o. female with medical history significant of DM2, HTN, CKD stage 4, HLD, obesity, hypertensive cardiomyopathy / diastolic CHF.  Patient presents to the ED with c/o chest, abd, back pain.  Took mylanta, baking soda, water, zantac, ibuprofen.  Back and abd pain resolved, L sided CP remains.  Constant pressure.  8/10 intensity still.  Intermittently sharp.   ED Course: Trop neg.  Hospitalist asked to admit.   Review of Systems: As per HPI otherwise 10 point review of systems negative.   Past Medical History:  Diagnosis Date  . BPPV (benign paroxysmal positional vertigo)   . Chronic congestive heart failure with left ventricular diastolic dysfunction ([place])   . CKD (chronic kidney disease), stage III ([place]) 05/15/2017  . Diabetes mellitus   . Hyperlipidemia   . Hypertension   . Left atrial enlargement   . Obesity   . Palpitations     Past Surgical History:  Procedure Laterality Date  . ABDOMINAL HYSTERECTOMY    . EYE SURGERY     cataract/left eye, right eye had bleeding in back  . KNEE SURGERY    . TRIGGER FINGER RELEASE       reports that she has never smoked. She has never used smokeless tobacco. She reports that she drinks alcohol. She reports that she does not use drugs.  No Known Allergies  Family History  Problem Relation Age of Onset  . Heart disease Father   . Hypertension Father   . Alzheimer's disease Mother   . Diabetes Mother   . Diabetes Sister   . Dementia Other      Prior to Admission medications   Medication Sig Start Date End Date Taking? Authorizing Provider  albuterol (PROVENTIL HFA;VENTOLIN HFA) 108 (90 Base) MCG/ACT inhaler Inhale 2 puffs into the lungs every 6  (six) hours as needed for wheezing or shortness of breath.   Yes [provider]  alum & mag hydroxide-simeth (MAALOX/MYLANTA) 200-200-20 MG/5ML suspension Take 30 mLs by mouth every 6 (six) hours as needed for indigestion or heartburn.   Yes [provider]  amLODipine (NORVASC) 10 MG tablet Take 1 tablet (10 mg total) by mouth daily. 05/18/17  Yes Eugenie Filler, MD  atorvastatin (LIPITOR) 40 MG tablet Take 40 mg by mouth daily.    Yes [provider]  carvedilol (COREG) 12.5 MG tablet Take 1 tablet (12.5 mg total) by mouth 2 (two) times daily with a meal. 06/10/17  Yes Sheikh, Omair Latif, DO  furosemide (LASIX) 40 MG tablet Take 1 tablet (40 mg total) by mouth every evening. 06/10/17  Yes Sheikh, Omair Latif, DO  furosemide (LASIX) 80 MG tablet Take 1 tablet (80 mg total) by mouth every morning. 06/11/17  Yes Sheikh, Omair Latif, DO  gabapentin (NEURONTIN) 100 MG capsule Take 100 mg by mouth 3 (three) times daily.    Yes [provider]  insulin NPH Human (HUMULIN N,NOVOLIN N) 100 UNIT/ML injection Inject 40 Units into the skin 2 (two) times daily before a meal.   Yes [provider]  potassium chloride SA (K-DUR,KLOR-CON) 20 MEQ tablet Take 1 tablet (20 mEq total) by mouth daily. 06/11/17  Yes Kerney Elbe, DO    Physical  Exam: Vitals:   03/07/18 1550 03/07/18 1552 03/07/18 1807 03/07/18 1930  BP: (!) 151/74  (!) 143/70 (!) 170/94  Pulse: 68  67 68  Resp: 16  18 20   Temp: 98 F (36.7 C)  98.2 F (36.8 C)   TempSrc: Oral     SpO2: 98%  98% 93%  Weight:  108.4 kg (239 lb)    Height:  5' 4.5" (1.638 m)      Constitutional: NAD, calm, comfortable Eyes: PERRL, lids and conjunctivae normal ENMT: Mucous membranes are moist. Posterior pharynx clear of any exudate or lesions.Normal dentition.  Neck: normal, supple, no masses, no thyromegaly Respiratory: clear to auscultation bilaterally, no wheezing, no crackles. Normal respiratory effort. No  accessory muscle use.  Cardiovascular: Regular rate and rhythm, no murmurs / rubs / gallops. No extremity edema. 2+ pedal pulses. No carotid bruits.  Abdomen: no tenderness, no masses palpated. No hepatosplenomegaly. Bowel sounds positive.  Musculoskeletal: no clubbing / cyanosis. No joint deformity upper and lower extremities. Good ROM, no contractures. Normal muscle tone.  Skin: no rashes, lesions, ulcers. No induration Neurologic: CN 2-12 grossly intact. Sensation intact, DTR normal. Strength 5/5 in all 4.  Psychiatric: Normal judgment and insight. Alert and oriented x 3. Normal mood.    Labs on Admission: I have personally reviewed following labs and imaging studies  CBC: Recent Labs  Lab 03/07/18 1606  WBC 5.1  HGB 11.1*  HCT 34.8*  MCV 82.7  PLT 127   Basic Metabolic Panel: Recent Labs  Lab 03/07/18 1606  NA 138  K 4.3  CL 101  CO2 27  GLUCOSE 313*  BUN 51*  CREATININE 2.17*  CALCIUM 9.1   GFR: Estimated Creatinine Clearance: 33.1 mL/min (A) (by C-G formula based on SCr of 2.17 mg/dL (H)). Liver Function Tests: No results for input(s): AST, ALT, ALKPHOS, BILITOT, PROT, ALBUMIN in the last 168 hours. No results for input(s): LIPASE, AMYLASE in the last 168 hours. No results for input(s): AMMONIA in the last 168 hours. Coagulation Profile: No results for input(s): INR, PROTIME in the last 168 hours. Cardiac Enzymes: No results for input(s): CKTOTAL, CKMB, CKMBINDEX, TROPONINI in the last 168 hours. BNP (last 3 results) No results for input(s): PROBNP in the last 8760 hours. HbA1C: No results for input(s): HGBA1C in the last 72 hours. CBG: No results for input(s): GLUCAP in the last 168 hours. Lipid Profile: No results for input(s): CHOL, HDL, LDLCALC, TRIG, CHOLHDL, LDLDIRECT in the last 72 hours. Thyroid Function Tests: No results for input(s): TSH, T4TOTAL, FREET4, T3FREE, THYROIDAB in the last 72 hours. Anemia Panel: No results for input(s): VITAMINB12,  FOLATE, FERRITIN, TIBC, IRON, RETICCTPCT in the last 72 hours. Urine analysis:    Component Value Date/Time   COLORURINE COLORLESS (A) 06/07/2017 2032   APPEARANCEUR CLEAR 06/07/2017 2032   LABSPEC 1.005 06/07/2017 2032   PHURINE 5.0 06/07/2017 2032   GLUCOSEU NEGATIVE 06/07/2017 2032   HGBUR SMALL (A) 06/07/2017 2032   BILIRUBINUR NEGATIVE 06/07/2017 2032   KETONESUR NEGATIVE 06/07/2017 2032   PROTEINUR 100 (A) 06/07/2017 2032   UROBILINOGEN 0.2 02/14/2012 1639   NITRITE NEGATIVE 06/07/2017 2032   LEUKOCYTESUR NEGATIVE 06/07/2017 2032    Radiological Exams on Admission: Dg Chest 2 View  Result Date: 03/07/2018 CLINICAL DATA:  Left-sided chest pain for several hours EXAM: CHEST - 2 VIEW COMPARISON:  06/09/2017 FINDINGS: The heart size and mediastinal contours are within normal limits. Both lungs are clear. The visualized skeletal structures are unremarkable. IMPRESSION: No  active cardiopulmonary disease. Electronically Signed   By: Inez Catalina M.D.   On: 03/07/2018 16:32    EKG: Independently reviewed.  Assessment/Plan Principal Problem:   Chest pain, rule out acute myocardial infarction Active Problems:   Insulin-requiring or dependent type II diabetes mellitus (HCC)   Essential hypertension   Chronic diastolic CHF (congestive heart failure) (HCC)   CKD (chronic kidney disease) stage 4, GFR 15-29 ml/min (HCC)    1. CP r/o - 1. CP obs pathway 2. Serial trops 3. Tele monitor 4. NPO after MN 5. Cards eval in AM 2. DM - 1. NPH 20u BID while NPO 2. Sensitive SSI Q4H 3. HTN - continue home BP meds 4. Chronic diastolic CHF - hold lasix while NPO, resume when eating again 5. CKD stage 4 - chronic and baseline currently, follows with Dr. Florene Glen  DVT prophylaxis: Heparin [place] Code Status: Full Family Communication: No family in room Disposition Plan: Home after admit Consults called: Message sent to P trent for cards eval in AM Admission status: Place in [place],  [facility] Pager [phone]  If 7AM-7PM, please contact day team taking care of patient [email or web address] Password [facility]  03/07/2018, 7:46 PM

## 2018-03-08 ENCOUNTER — Observation Stay (HOSPITAL_BASED_OUTPATIENT_CLINIC_OR_DEPARTMENT_OTHER): Payer: BLUE CROSS/BLUE SHIELD

## 2018-03-08 ENCOUNTER — Encounter (HOSPITAL_COMMUNITY): Payer: Self-pay | Admitting: Nephrology

## 2018-03-08 DIAGNOSIS — R079 Chest pain, unspecified: Secondary | ICD-10-CM

## 2018-03-08 DIAGNOSIS — I130 Hypertensive heart and chronic kidney disease with heart failure and stage 1 through stage 4 chronic kidney disease, or unspecified chronic kidney disease: Secondary | ICD-10-CM | POA: Diagnosis not present

## 2018-03-08 DIAGNOSIS — R9431 Abnormal electrocardiogram [ECG] [EKG]: Secondary | ICD-10-CM | POA: Diagnosis not present

## 2018-03-08 DIAGNOSIS — R0789 Other chest pain: Secondary | ICD-10-CM | POA: Diagnosis not present

## 2018-03-08 DIAGNOSIS — I10 Essential (primary) hypertension: Secondary | ICD-10-CM | POA: Diagnosis not present

## 2018-03-08 DIAGNOSIS — N184 Chronic kidney disease, stage 4 (severe): Secondary | ICD-10-CM | POA: Diagnosis not present

## 2018-03-08 DIAGNOSIS — B029 Zoster without complications: Secondary | ICD-10-CM | POA: Diagnosis not present

## 2018-03-08 DIAGNOSIS — I5032 Chronic diastolic (congestive) heart failure: Secondary | ICD-10-CM | POA: Diagnosis not present

## 2018-03-08 DIAGNOSIS — E1122 Type 2 diabetes mellitus with diabetic chronic kidney disease: Secondary | ICD-10-CM | POA: Diagnosis not present

## 2018-03-08 LAB — NM MYOCAR MULTI W/SPECT W/WALL MOTION / EF
CSEPEDS: 1 s
Estimated workload: 1 METS
Exercise duration (min): 5 min
MPHR: 159 {beats}/min
Peak HR: 98 {beats}/min
Percent HR: 61 %
Rest HR: 75 {beats}/min

## 2018-03-08 LAB — TROPONIN I

## 2018-03-08 LAB — GLUCOSE, CAPILLARY
GLUCOSE-CAPILLARY: 143 mg/dL — AB (ref 65–99)
GLUCOSE-CAPILLARY: 231 mg/dL — AB (ref 65–99)
Glucose-Capillary: 165 mg/dL — ABNORMAL HIGH (ref 65–99)
Glucose-Capillary: 389 mg/dL — ABNORMAL HIGH (ref 65–99)
Glucose-Capillary: 403 mg/dL — ABNORMAL HIGH (ref 65–99)
Glucose-Capillary: 425 mg/dL — ABNORMAL HIGH (ref 65–99)
Glucose-Capillary: 471 mg/dL — ABNORMAL HIGH (ref 65–99)

## 2018-03-08 MED ORDER — INSULIN ASPART 100 UNIT/ML ~~LOC~~ SOLN
0.0000 [IU] | Freq: Three times a day (TID) | SUBCUTANEOUS | Status: DC
  Administered 2018-03-09: 4 [IU] via SUBCUTANEOUS
  Administered 2018-03-09: 7 [IU] via SUBCUTANEOUS
  Administered 2018-03-09: 3 [IU] via SUBCUTANEOUS
  Administered 2018-03-10: 4 [IU] via SUBCUTANEOUS

## 2018-03-08 MED ORDER — INSULIN ASPART 100 UNIT/ML ~~LOC~~ SOLN
0.0000 [IU] | Freq: Every day | SUBCUTANEOUS | Status: DC
  Administered 2018-03-09: 2 [IU] via SUBCUTANEOUS

## 2018-03-08 MED ORDER — INSULIN ASPART 100 UNIT/ML ~~LOC~~ SOLN
6.0000 [IU] | Freq: Three times a day (TID) | SUBCUTANEOUS | Status: DC
  Administered 2018-03-08 – 2018-03-10 (×3): 6 [IU] via SUBCUTANEOUS

## 2018-03-08 MED ORDER — CARVEDILOL 25 MG PO TABS
25.0000 mg | ORAL_TABLET | Freq: Two times a day (BID) | ORAL | Status: DC
  Administered 2018-03-08 – 2018-03-10 (×4): 25 mg via ORAL
  Filled 2018-03-08 (×4): qty 1

## 2018-03-08 MED ORDER — INSULIN ASPART 100 UNIT/ML ~~LOC~~ SOLN
15.0000 [IU] | Freq: Once | SUBCUTANEOUS | Status: AC
  Administered 2018-03-08: 15 [IU] via SUBCUTANEOUS

## 2018-03-08 MED ORDER — ASPIRIN EC 81 MG PO TBEC
81.0000 mg | DELAYED_RELEASE_TABLET | Freq: Every day | ORAL | Status: DC
  Administered 2018-03-08 – 2018-03-10 (×3): 81 mg via ORAL
  Filled 2018-03-08 (×3): qty 1

## 2018-03-08 MED ORDER — REGADENOSON 0.4 MG/5ML IV SOLN
0.4000 mg | Freq: Once | INTRAVENOUS | Status: AC
  Administered 2018-03-08: 0.4 mg via INTRAVENOUS
  Filled 2018-03-08: qty 5

## 2018-03-08 MED ORDER — TECHNETIUM TC 99M TETROFOSMIN IV KIT
10.0000 | PACK | Freq: Once | INTRAVENOUS | Status: AC | PRN
  Administered 2018-03-08: 10 via INTRAVENOUS

## 2018-03-08 MED ORDER — REGADENOSON 0.4 MG/5ML IV SOLN
INTRAVENOUS | Status: AC
  Filled 2018-03-08: qty 5

## 2018-03-08 MED ORDER — INSULIN ASPART 100 UNIT/ML ~~LOC~~ SOLN
10.0000 [IU] | Freq: Once | SUBCUTANEOUS | Status: AC
  Administered 2018-03-08: 10 [IU] via SUBCUTANEOUS

## 2018-03-08 MED ORDER — CLONAZEPAM 0.125 MG PO TBDP
0.2500 mg | ORAL_TABLET | Freq: Three times a day (TID) | ORAL | Status: DC | PRN
  Administered 2018-03-08: 0.25 mg via ORAL
  Filled 2018-03-08: qty 2

## 2018-03-08 MED ORDER — CLONAZEPAM 0.5 MG PO TABS: 0.2500 mg | ORAL_TABLET | Freq: Three times a day (TID) | ORAL | Status: DC | PRN

## 2018-03-08 NOTE — Progress Notes (Addendum)
PROGRESS NOTE  Latasha [name] GEZ:[id number] DOB: 18-Jun-1956 DOA: 03/07/2018 PCP: Shirline Frees, MD  HPI/Recap of past 24 hours:  Anxious, currently no pain, Husband at bedside  Assessment/Plan: Principal Problem:   Chest pain, rule out acute myocardial infarction Active Problems:   Insulin-requiring or dependent type II diabetes mellitus ([place])   Essential hypertension   Chronic diastolic CHF (congestive heart failure) (HCC)   CKD (chronic kidney disease) stage 4, GFR 15-29 ml/min (HCC)  Chest pain: Negative troponin or acute EKG changes Intermediate risk stress test with moderate reversible defect in the inferior/inferolateral wall suggestive of ischemia. Cardiology plan for cardiac cath, cardiology recommends nephrology consult due to concerns of contrast nephropathy.  hypertensive heart disease with chronic diastolic heart failure continue norvasc, coreg Home meds lasix held due to anticipating cardiac cath Currently on room air  CKDIV -she is followed by Dr Florene Glen -cr at baseline  -nephrology consulted  IDDM2 Last a1c 11 from 06/2017 Repeat a1c,  Blood glucose elevated,  Continue long acting insulin, add meal coverage and ssi (achs)  Morbid obesity:  she never had sleep study Body mass index is 40.29 kg/m.  Code Status: full  Family Communication: patient and husband  Disposition Plan: not ready to discharge   Consultants:  Cardiology  nephrology  Procedures:  Cardiac nuc stress test on 6/2  Cath planned on 6/3  Antibiotics:  none   Objective: BP (!) 152/71 (BP Location: Right Arm)   Pulse 80   Temp 98.2 F (36.8 C) (Oral)   Resp 20   Ht 5' 4.5" (1.638 m)   Wt 108.1 kg (238 lb 6.4 oz)   SpO2 95%   BMI 40.29 kg/m   Intake/Output Summary (Last 24 hours) at 03/08/2018 1700 Last data filed at 03/08/2018 1625 Gross per 24 hour  Intake -  Output 400 ml  Net -400 ml   Filed Weights   03/07/18 1552 03/08/18 0444  Weight: 108.4 kg (239  lb) 108.1 kg (238 lb 6.4 oz)    Exam: Patient is examined daily including today on 03/08/2018, exams remain the same as of yesterday except that has changed    General:  NAD, obese  Cardiovascular: RRR  Respiratory: CTABL  Abdomen: Soft/ND/NT, positive BS  Musculoskeletal: No Edema  Neuro: alert, oriented   Data Reviewed: Basic Metabolic Panel: Recent Labs  Lab 03/07/18 1606  NA 138  K 4.3  CL 101  CO2 27  GLUCOSE 313*  BUN 51*  CREATININE 2.17*  CALCIUM 9.1   Liver Function Tests: Recent Labs  Lab 03/07/18 2010  AST 20  ALT 24  ALKPHOS 93  BILITOT 0.6  PROT 7.5  ALBUMIN 3.5   No results for input(s): LIPASE, AMYLASE in the last 168 hours. No results for input(s): AMMONIA in the last 168 hours. CBC: Recent Labs  Lab 03/07/18 1606  WBC 5.1  HGB 11.1*  HCT 34.8*  MCV 82.7  PLT 270   Cardiac Enzymes:   Recent Labs  Lab 03/07/18 2010 03/07/18 2230 03/08/18 0055  TROPONINI <0.03 <0.03 <0.03   BNP (last 3 results) Recent Labs    06/07/17 1317  BNP 141.0*    ProBNP (last 3 results) No results for input(s): PROBNP in the last 8760 hours.  CBG: Recent Labs  Lab 03/08/18 0014 03/08/18 0447 03/08/18 0756 03/08/18 1249 03/08/18 1637  GLUCAP 425* 165* 143* 231* 403*    No results found for this or any previous visit (from the past 240 hour(s)).  Studies: Nm Myocar Multi W/spect W/wall Motion / Ef  Result Date: 03/08/2018 CLINICAL DATA:  Chest pain. EXAM: MYOCARDIAL IMAGING WITH SPECT (REST AND PHARMACOLOGIC-STRESS) GATED LEFT VENTRICULAR WALL MOTION STUDY LEFT VENTRICULAR EJECTION FRACTION TECHNIQUE: Standard myocardial SPECT imaging was performed after resting intravenous injection of 10 mCi Tc-79m tetrofosmin. Subsequently, intravenous infusion of Lexiscan was performed under the supervision of the Cardiology staff. At peak effect of the drug, 30 mCi Tc-53m tetrofosmin was injected intravenously and standard myocardial SPECT imaging was  performed. Quantitative gated imaging was also performed to evaluate left ventricular wall motion, and estimate left ventricular ejection fraction. COMPARISON:  None. FINDINGS: Perfusion: There is a moderate sized region of decreased uptake in the inferior/inferolateral wall on stress images only. No other focal perfusion abnormalities identified. Wall Motion: Normal left ventricular wall motion. No left ventricular dilation. Left Ventricular Ejection Fraction: 51 % End diastolic volume 195 ml End systolic volume 69 ml IMPRESSION: 1. There is a moderate sized reversible defect in the inferior/inferolateral wall suggestive of ischemia. 2. Normal left ventricular wall motion. 3. Left ventricular ejection fraction 51% 4. Non invasive risk stratification*: Intermediate *2012 Appropriate Use Criteria for Coronary Revascularization Focused Update: J Am Coll Cardiol. 9747;18(5):501-586. http://content.airportbarriers.com.aspx?articleid=[email or web address] Electronically Signed   By: Dorise Bullion III M.D   On: 03/08/2018 13:02    Scheduled Meds: . amLODipine  10 mg Oral Daily  . aspirin EC  81 mg Oral Daily  . atorvastatin  40 mg Oral Daily  . carvedilol  25 mg Oral BID WC  . gabapentin  100 mg Oral TID  . heparin  5,000 Units Subcutaneous Q8H  . [START ON 03/09/2018] insulin aspart  0-20 Units Subcutaneous TID WC  . insulin aspart  0-5 Units Subcutaneous QHS  . insulin NPH Human  20 Units Subcutaneous BID AC & HS  . regadenoson        Continuous Infusions:   Time spent: 66mins, case discussed with cardiology and nephrology I have personally reviewed and interpreted on  03/08/2018 daily labs, tele strips, imagings as discussed above under date review session and assessment and plans.  I reviewed all nursing notes, pharmacy notes, consultant notes,  vitals, pertinent old records  I have discussed plan of care as described above with RN , patient and family on 03/08/2018   Florencia Reasons MD, PhD  [facility] Pager [phone]. If 7PM-7AM, please contact night-coverage at [email or web address], password [facility] 03/08/2018, 5:00 PM  LOS: 0 days

## 2018-03-08 NOTE — Consult Note (Signed)
Renal Service Consult Note [facility]  Latasha [name] 03/08/2018 Sol Blazing Requesting Physician:  Dr Florencia Reasons  Reason for Consult:  CKD pt w/ chest pain and +stress test HPI: The patient is a 62 y.o. year-old with hx of HTN, DM2 on insulin, CKD baseline creat 2.0- 2.5 f/b Dr Florene Glen at [facility], admitted for chest pain. Had intermediate risk stress test with moderate reversible defect in inf/ inferolat wall suggestive of ischemia.  Asked to see for CKD and AKI risk with heart cath.    Patient denies nsaid use , not taking any acei/ arb's.  BP's stable here. Reports usual ankle swelling but no orthopnea or cough or sig DOE.  No active CP.    ROS  denies CP  no joint pain   no HA  no blurry vision  no rash  no diarrhea  no nausea/ vomiting  no dysuria  no difficulty voiding  no change in urine color    Past Medical History  Past Medical History:  Diagnosis Date  . BPPV (benign paroxysmal positional vertigo)   . Chronic congestive heart failure with left ventricular diastolic dysfunction ([place])   . CKD (chronic kidney disease), stage III ([place]) 05/15/2017  . Diabetes mellitus   . Hyperlipidemia   . Hypertension   . Left atrial enlargement   . Obesity   . Palpitations    Past Surgical History  Past Surgical History:  Procedure Laterality Date  . ABDOMINAL HYSTERECTOMY    . EYE SURGERY     cataract/left eye, right eye had bleeding in back  . KNEE SURGERY    . TRIGGER FINGER RELEASE     Family History  Family History  Problem Relation Age of Onset  . Heart disease Father   . Hypertension Father   . Alzheimer's disease Mother   . Diabetes Mother   . Diabetes Sister   . Dementia Other    Social History  reports that she has never smoked. She has never used smokeless tobacco. She reports that she drinks alcohol. She reports that she does not use drugs. Allergies No Known Allergies Home medications Prior to Admission medications   Medication Sig Start  Date End Date Taking? Authorizing Provider  albuterol (PROVENTIL HFA;VENTOLIN HFA) 108 (90 Base) MCG/ACT inhaler Inhale 2 puffs into the lungs every 6 (six) hours as needed for wheezing or shortness of breath.   Yes [provider]  alum & mag hydroxide-simeth (MAALOX/MYLANTA) 200-200-20 MG/5ML suspension Take 30 mLs by mouth every 6 (six) hours as needed for indigestion or heartburn.   Yes [provider]  amLODipine (NORVASC) 10 MG tablet Take 1 tablet (10 mg total) by mouth daily. 05/18/17  Yes Eugenie Filler, MD  atorvastatin (LIPITOR) 40 MG tablet Take 40 mg by mouth daily.    Yes [provider]  carvedilol (COREG) 12.5 MG tablet Take 1 tablet (12.5 mg total) by mouth 2 (two) times daily with a meal. 06/10/17  Yes Sheikh, Omair Latif, DO  furosemide (LASIX) 40 MG tablet Take 1 tablet (40 mg total) by mouth every evening. 06/10/17  Yes Sheikh, Omair Latif, DO  furosemide (LASIX) 80 MG tablet Take 1 tablet (80 mg total) by mouth every morning. 06/11/17  Yes Sheikh, Omair Latif, DO  gabapentin (NEURONTIN) 100 MG capsule Take 100 mg by mouth 3 (three) times daily.    Yes [provider]  insulin NPH Human (HUMULIN N,NOVOLIN N) 100 UNIT/ML injection Inject 40 Units into the skin  2 (two) times daily before a meal.   Yes [provider]  potassium chloride SA (K-DUR,KLOR-CON) 20 MEQ tablet Take 1 tablet (20 mEq total) by mouth daily. 06/11/17  Yes Raiford Noble [place], DO   Liver Function Tests Recent Labs  Lab 03/07/18 2010  AST 20  ALT 24  ALKPHOS 93  BILITOT 0.6  PROT 7.5  ALBUMIN 3.5   No results for input(s): LIPASE, AMYLASE in the last 168 hours. CBC Recent Labs  Lab 03/07/18 1606  WBC 5.1  HGB 11.1*  HCT 34.8*  MCV 82.7  PLT 409   Basic Metabolic Panel Recent Labs  Lab 03/07/18 1606  NA 138  K 4.3  CL 101  CO2 27  GLUCOSE 313*  BUN 51*  CREATININE 2.17*  CALCIUM 9.1   Iron/TIBC/Ferritin/ %Sat    Component Value Date/Time    IRON 45 05/17/2017 0939   TIBC 234 (L) 05/17/2017 0939   FERRITIN 101 05/17/2017 0939   IRONPCTSAT 19 05/17/2017 0939    Vitals:   03/08/18 1257 03/08/18 1634 03/08/18 1637 03/08/18 1643  BP: (!) 139/44 (!) 158/63 135/67 (!) 152/71  Pulse: 74 80    Resp:      Temp: 98.4 F (36.9 C) 98.2 F (36.8 C)    TempSrc: Oral Oral    SpO2: 94% 95%    Weight:      Height:       Exam Gen alert obese AAF no distress No rash, cyanosis or gangrene Sclera anicteric, throat clear  +JVD no bruits Chest clear bilat no rales or wheezing RRR no MRG Abd soft ntnd no mass or ascites +bs obese GU defer MS no joint effusions or deformity Ext no sig LE edema, no wounds or ulcers Neuro is alert, Ox 3 , nf    Home meds:  - nph insulin 40 bid  - norvasc 10/ coreg 12.5 bid/ lasix 80 am and 40 pm/ Kdur  - neurontin 100 tid/ statin/ albut nebs prn   Na 138  BUN 51 Cr 2.17 eGFR 27   Alb 3.5  UA not sent  Impression: 1  CKD stage IV - f/b CKA , baseline creat 2.0- 2.5.  Risk of contrast induced AKI is about 1 in 9, risk of requiring hemodialysis is < 0.5%.  Have d/w patient and answered questions. Cautious IVF"s recommended before and after cath, hold lasix for next 1-2 days if tolerates well.   2  Volume - mild vol excess on exam 3  HTN 4  DM2 on insulin 5  Chron diast CHF   Plan - please call as needed, will sign off.   Kelly Splinter MD [employer] pager [phone]   03/08/2018, 5:44 PM

## 2018-03-08 NOTE — Plan of Care (Signed)
  Problem: Clinical Measurements: Goal: Ability to maintain clinical measurements within normal limits will improve Outcome: Progressing   Problem: Clinical Measurements: Goal: Ability to maintain clinical measurements within normal limits will improve Outcome: Progressing   Problem: Clinical Measurements: Goal: Respiratory complications will improve Outcome: Progressing   Problem: Clinical Measurements: Goal: Cardiovascular complication will be avoided Outcome: Progressing

## 2018-03-08 NOTE — Consult Note (Addendum)
Cardiology Consultation:   Patient ID: ZAELA GRALEY; [id number]; [birth year]   Admit date: 03/07/2018 Date of Consult: 03/08/2018  Primary Care Provider: Shirline Frees, MD Primary Cardiologist: Quay Burow, MD   Patient Profile:   Latasha [name] is a 62 y.o. female with a hx of hypertensive heart disease with chronic diastolic heart failure, hyperlipidemia, diabetes mellitus with retinopathy, chronic kidney disease stage IV (followed by Dr. Florene Glen) and palpitation who is being seen today for the evaluation of chest pain at the request of Dr. Alcario Drought.  Previously seen by Dr. Radford Pax followed by Dr. [name] for evaluation of palpitation.  No arrhythmia on monitor.  Echocardiogram with diastolic dysfunction.  Encouraged smoking sensation, weight loss and regular exercise.  Last echocardiogram 05/15/2017 showed LV function of 60 to 65%, no wall motion abnormality, grade 1 diastolic dysfunction, moderately severely dilated left atrium, mildly dilated right atrium and pulmonary pressure of 46 mmHg.  History of Present Illness:   Ms. Latasha [name] was in usual state of health up until 2 days ago when noted chest, abdominal and back pain.  She had a 2 types of chest pain "intermittent sharp stabbing and dull achy constant pressure ".  He had a sharp back and abdominal pain as well.  She felt this was due to indigestion and took baking soda and Mylanta.  Her sharp pain resolved but persisted constant dull achy lower sternal pressure.  She does weight lifting for 10 minutes and rides stationary bikes for 20 minutes every day.  Her pain resolved during exercise however reoccurred afterwards.  He has chronic lower extremity edema without orthopnea, PND, syncope, dizziness or melena.  Also has vertigo.  Due to ongoing symptoms he came to ER for further evaluation.  Admitted for rule out.  Troponin has been negative so far.  Last night around 2 PM patient had sudden onset of left-sided chest pain that resolved with  sublingual nitroglycerin.  EKG showed new T wave inversion in lead I and aVL however which was present on prior EKG in 2018 but not during admission.  Blood pressure minimally elevated during admission.  However, patient states that it was normal during nephrology visit about a week ago.  No recurrence of lower sternal chest pressure since got sublingual nitroglycerin.  However she had a sharp shooting chest pain underneath her left breast during exam but no tender to palpation.  Creatinine 2.17.  Blood sugar in 300s.  Chest x-ray without acute cardiopulmonary disease.  Past Medical History:  Diagnosis Date  . BPPV (benign paroxysmal positional vertigo)   . Chronic congestive heart failure with left ventricular diastolic dysfunction ([place])   . CKD (chronic kidney disease), stage III ([place]) 05/15/2017  . Diabetes mellitus   . Hyperlipidemia   . Hypertension   . Left atrial enlargement   . Obesity   . Palpitations     Past Surgical History:  Procedure Laterality Date  . ABDOMINAL HYSTERECTOMY    . EYE SURGERY     cataract/left eye, right eye had bleeding in back  . KNEE SURGERY    . TRIGGER FINGER RELEASE     Inpatient Medications: Scheduled Meds: . amLODipine  10 mg Oral Daily  . atorvastatin  40 mg Oral Daily  . carvedilol  12.5 mg Oral BID WC  . gabapentin  100 mg Oral TID  . heparin  5,000 Units Subcutaneous Q8H  . insulin aspart  0-9 Units Subcutaneous Q4H  . insulin NPH Human  20 Units Subcutaneous BID AC &  HS   Continuous Infusions:  PRN Meds: acetaminophen, albuterol, alum & mag hydroxide-simeth, fentaNYL (SUBLIMAZE) injection, nitroGLYCERIN, ondansetron (ZOFRAN) IV  Allergies:   No Known Allergies  Social History:   Social History   Socioeconomic History  . Marital status: Married    Spouse name: Not on file  . Number of children: Not on file  . Years of education: Not on file  . Highest education level: Not on file  Occupational History  . Not on file    Social Needs  . Financial resource strain: Not on file  . Food insecurity:    Worry: Not on file    Inability: Not on file  . Transportation needs:    Medical: Not on file    Non-medical: Not on file  Tobacco Use  . Smoking status: Never Smoker  . Smokeless tobacco: Never Used  Substance and Sexual Activity  . Alcohol use: Yes    Comment:  rare  . Drug use: No  . Sexual activity: Not on file  Lifestyle  . Physical activity:    Days per week: Not on file    Minutes per session: Not on file  . Stress: Not on file  Relationships  . Social connections:    Talks on phone: Not on file    Gets together: Not on file    Attends religious service: Not on file    Active member of club or organization: Not on file    Attends meetings of clubs or organizations: Not on file    Relationship status: Not on file  . Intimate partner violence:    Fear of current or ex partner: Not on file    Emotionally abused: Not on file    Physically abused: Not on file    Forced sexual activity: Not on file  Other Topics Concern  . Not on file  Social History Narrative  . Not on file    Family History:   Family History  Problem Relation Age of Onset  . Heart disease Father   . Hypertension Father   . Alzheimer's disease Mother   . Diabetes Mother   . Diabetes Sister   . Dementia Other      ROS:  Please see the history of present illness.  All other ROS reviewed and negative.     Physical Exam/Data:   Vitals:   03/07/18 2218 03/08/18 0016 03/08/18 0227 03/08/18 0444  BP: (!) 171/77 (!) 141/65 (!) 163/75 (!) 154/78  Pulse: 74 74 75 73  Resp:  16 18 20   Temp:  98.4 F (36.9 C)  98.2 F (36.8 C)  TempSrc:  Oral  Oral  SpO2:  92%  95%  Weight:    238 lb 6.4 oz (108.1 kg)  Height:       No intake or output data in the 24 hours ending 03/08/18 0751 Filed Weights   03/07/18 1552 03/08/18 0444  Weight: 239 lb (108.4 kg) 238 lb 6.4 oz (108.1 kg)   Body mass index is 40.29 kg/m.   General: Obese female in no acute distress  HEENT: normal Lymph: no adenopathy Neck: JVD difficult to assess due to grith Endocrine:  No thryomegaly Vascular: No carotid bruits; FA pulses 2+ bilaterally without bruits  Cardiac:  normal S1, S2; RRR; no murmur Lungs:  clear to auscultation bilaterally, no wheezing, rhonchi or rales  Abd: soft, nontender, no hepatomegaly  Ext: 2+ bilateral lower extremity edema Musculoskeletal:  No deformities, BUE and BLE strength normal  and equal Skin: warm and dry  Neuro:  CNs 2-12 intact, no focal abnormalities noted Psych:  Normal affect   EKG:  The EKG was personally reviewed and demonstrates: Sinus rhythm with T wave inversion in lateral lead Telemetry:  Telemetry was personally reviewed and demonstrates: Sinus rhythm with rare PVC  Relevant CV Studies: As summarized above  Laboratory Data:  Chemistry Recent Labs  Lab 03/07/18 1606  NA 138  K 4.3  CL 101  CO2 27  GLUCOSE 313*  BUN 51*  CREATININE 2.17*  CALCIUM 9.1  GFRNONAA 23*  GFRAA 27*  ANIONGAP 10    Recent Labs  Lab 03/07/18 2010  PROT 7.5  ALBUMIN 3.5  AST 20  ALT 24  ALKPHOS 93  BILITOT 0.6   Hematology Recent Labs  Lab 03/07/18 1606  WBC 5.1  RBC 4.21  HGB 11.1*  HCT 34.8*  MCV 82.7  MCH 26.4  MCHC 31.9  RDW 13.5  PLT 270   Cardiac Enzymes Recent Labs  Lab 03/07/18 2010 03/07/18 2230 03/08/18 0055  TROPONINI <0.03 <0.03 <0.03    Recent Labs  Lab 03/07/18 1634 03/07/18 2021  TROPIPOC 0.01 0.01    Radiology/Studies:  Dg Chest 2 View  Result Date: 03/07/2018 CLINICAL DATA:  Left-sided chest pain for several hours EXAM: CHEST - 2 VIEW COMPARISON:  06/09/2017 FINDINGS: The heart size and mediastinal contours are within normal limits. Both lungs are clear. The visualized skeletal structures are unremarkable. IMPRESSION: No active cardiopulmonary disease. Electronically Signed   By: Inez Catalina M.D.   On: 03/07/2018 16:32    Assessment and  Plan:   1.  Chest pain -Mixed symptoms.  Sharp pain resolved after [place] and lower sternal constant pressure resolved after sublingual nitroglycerin x2.  EKG showed chronic T wave inversion in lead V5 and V6.  She had new T wave inversion overnight when she had a chest pain in the leads 1 and aVL it was also present in prior EKG in 2018.  Troponin has been negative. - Her cardiac risk factor includes hypertension, diabetes, hyperlipidemia and morbid obesity. She is not  a candidate for cardiac catheterization given chronic kidney disease stage IV. Will do Lexiscan. Start aspirin 81mg  qd. Increase coreg to 25mg  BId. Watch HR closely. If recurrent symptoms will plan to add imdur.   2.  Hypertension -Elevated during admission.  It was normal during the nephrologist visit next last week. -Continue Norvasc 10 mg.  Coreg 25 mg twice daily as above.   3.  Diabetes mellitus with retinopathy -Blood sugar running high.  Medication adjustment per primary team.  4.  Hyperlipidemia -06/08/2017: Cholesterol 116; HDL 38; LDL Cholesterol 55; Triglycerides 114; VLDL 23  -Continue statin.  5.  Chronic kidney disease stage IV -Serum creatinine stable.  Followed by Dr. Florene Glen.  6.  Chronic diastolic heart failure -Lasix hold on admission.  2+ bilateral lower extremity edema noted on exam however lungs clear.  No orthopnea PND or shortness of breath. Consider resumption of lasix. Will need heart failure education. Diuretics managed by nephrologist.   For questions or updates, please contact [place] Please consult [email or web address] for contact info under Cardiology/STEMI.   Jarrett Soho, [place]  03/08/2018 7:51 AM   The patient was seen and examined, and I agree with the history, physical exam, assessment and plan as documented above, with modifications as noted below. I have also personally reviewed all relevant documentation, old records, labs, and both radiographic and cardiovascular studies. I  have also independently interpreted old and new ECG's.  Briefly, 61 yr old woman with aforementioned medical history (CKD stage IV, hypertensive heart disease, heart failure with preserved ejection fraction, and diabetes with retinopathy presenting with chest pain.  She awoke with the pain on 5/31. It was located in the precordial region and radiated to the left infraaxillary region and to the posterior scapula.  She exercised for 20 minutes on the stationary bike and lifted weights with resolution of symptoms. She also has left breast tenderness and feels like the cup has been tighter fitting recently.  She had been helping her husband to lay down a 7' x 10' rug the night before but says it wasn't particularly heavy.  She had pain last night relieved with 2 SL nitro tablets. On admission, there were TWI's in V5-6 seen in August 2018. However, there were no TWI's in I and aVL. When she had chest pain last night, there were again TWI's in I and aVL as well.  This is suspicious for ischemic heart disease in the left circumflex distribution.  Given her advanced CKD, she is not an ideal candidate for coronary angiography.  Symptoms have a mixed picture.  I will start ASA 81 mg and increase Coreg to 25 mg bid. Will arrange for a Lexiscan Myoview today. If pain recurs, could consider the addition of Imdur.  I discussed this plan with the patient and her husband and they are in agreement.   Kate Sable, MD, [facility]  03/08/2018 8:30 AM  ADDENDUM:  Stress test interpreted as positive for inferior/inferolateral ischemia.  I spoke to the patient and her husband about the results.  While she would need coronary angiography, she is at high risk for contrast mediated nephropathy.  I think we need nephrology to weigh in as well. The hospitalist and I discussed this.  She would need pretreatment with IV fluids to attenuate the aforementioned risk.  She will be kept NPO at midnight until a  final plan is determined.

## 2018-03-08 NOTE — Progress Notes (Signed)
Pt is A&OX3. C/O 5/10 sudden left CP. CP was relieved with 2 nitro tabs. VSS. Denied SOB and diaphoresis. EGK was taken a show new T wave inversion in leads I, II, and AVL. Bodenheimer, NP was text paged.

## 2018-03-09 ENCOUNTER — Encounter (HOSPITAL_COMMUNITY)
Admission: EM | Disposition: A | Discharge status: Home / Self Care | Payer: Self-pay | Source: Home / Self Care | Attending: Emergency Medicine

## 2018-03-09 DIAGNOSIS — I5032 Chronic diastolic (congestive) heart failure: Secondary | ICD-10-CM | POA: Diagnosis not present

## 2018-03-09 DIAGNOSIS — B029 Zoster without complications: Secondary | ICD-10-CM | POA: Diagnosis not present

## 2018-03-09 DIAGNOSIS — R079 Chest pain, unspecified: Secondary | ICD-10-CM | POA: Diagnosis not present

## 2018-03-09 DIAGNOSIS — I130 Hypertensive heart and chronic kidney disease with heart failure and stage 1 through stage 4 chronic kidney disease, or unspecified chronic kidney disease: Secondary | ICD-10-CM | POA: Diagnosis not present

## 2018-03-09 DIAGNOSIS — I10 Essential (primary) hypertension: Secondary | ICD-10-CM | POA: Diagnosis not present

## 2018-03-09 DIAGNOSIS — E782 Mixed hyperlipidemia: Secondary | ICD-10-CM

## 2018-03-09 DIAGNOSIS — N184 Chronic kidney disease, stage 4 (severe): Secondary | ICD-10-CM | POA: Diagnosis not present

## 2018-03-09 DIAGNOSIS — R0789 Other chest pain: Secondary | ICD-10-CM | POA: Diagnosis not present

## 2018-03-09 HISTORY — PX: LEFT HEART CATH AND CORONARY ANGIOGRAPHY: CATH118249

## 2018-03-09 LAB — CBC
HCT: 31.6 % — ABNORMAL LOW (ref 36.0–46.0)
HEMOGLOBIN: 10.1 g/dL — AB (ref 12.0–15.0)
MCH: 26.8 pg (ref 26.0–34.0)
MCHC: 32 g/dL (ref 30.0–36.0)
MCV: 83.8 fL (ref 78.0–100.0)
PLATELETS: 232 10*3/uL (ref 150–400)
RBC: 3.77 MIL/uL — ABNORMAL LOW (ref 3.87–5.11)
RDW: 13.5 % (ref 11.5–15.5)
WBC: 4.5 10*3/uL (ref 4.0–10.5)

## 2018-03-09 LAB — GLUCOSE, CAPILLARY
GLUCOSE-CAPILLARY: 252 mg/dL — AB (ref 65–99)
GLUCOSE-CAPILLARY: 237 mg/dL — AB (ref 65–99)
GLUCOSE-CAPILLARY: 135 mg/dL — AB (ref 65–99)
GLUCOSE-CAPILLARY: 220 mg/dL — AB (ref 65–99)
GLUCOSE-CAPILLARY: 250 mg/dL — AB (ref 65–99)
Glucose-Capillary: 179 mg/dL — ABNORMAL HIGH (ref 65–99)
Glucose-Capillary: 339 mg/dL — ABNORMAL HIGH (ref 65–99)

## 2018-03-09 LAB — LIPID PANEL
CHOL/HDL RATIO: 3.9 ratio
CHOLESTEROL: 102 mg/dL (ref 0–200)
HDL: 26 mg/dL — ABNORMAL LOW (ref >40)
LDL Cholesterol: 53 mg/dL (ref 0–99)
TRIGLYCERIDES: 117 mg/dL (ref <150)
VLDL: 23 mg/dL (ref 0–40)

## 2018-03-09 LAB — PROTIME-INR
INR: 1.04
PROTHROMBIN TIME: 13.5 s (ref 11.4–15.2)

## 2018-03-09 LAB — COMPREHENSIVE METABOLIC PANEL
ALBUMIN: 3 g/dL — AB (ref 3.5–5.0)
ALK PHOS: 77 U/L (ref 38–126)
ALT: 21 U/L (ref 14–54)
ANION GAP: 5 (ref 5–15)
AST: 18 U/L (ref 15–41)
BUN: 50 mg/dL — ABNORMAL HIGH (ref 6–20)
CALCIUM: 8.2 mg/dL — AB (ref 8.9–10.3)
CHLORIDE: 105 mmol/L (ref 101–111)
CO2: 27 mmol/L (ref 22–32)
Creatinine, Ser: 2.4 mg/dL — ABNORMAL HIGH (ref 0.44–1.00)
GFR calc non Af Amer: 21 mL/min — ABNORMAL LOW (ref >60)
GFR, EST AFRICAN AMERICAN: 24 mL/min — AB (ref >60)
GLUCOSE: 290 mg/dL — AB (ref 65–99)
POTASSIUM: 4.2 mmol/L (ref 3.5–5.1)
SODIUM: 137 mmol/L (ref 135–145)
Total Bilirubin: 0.5 mg/dL (ref 0.3–1.2)
Total Protein: 6.7 g/dL (ref 6.5–8.1)

## 2018-03-09 LAB — HEMOGLOBIN A1C
Hgb A1c MFr Bld: 11.5 % — ABNORMAL HIGH (ref 4.8–5.6)
MEAN PLASMA GLUCOSE: 283.35 mg/dL

## 2018-03-09 SURGERY — LEFT HEART CATH AND CORONARY ANGIOGRAPHY
Anesthesia: LOCAL

## 2018-03-09 MED ORDER — HEPARIN (PORCINE) IN NACL 1000-0.9 UT/500ML-% IV SOLN
INTRAVENOUS | Status: AC
  Filled 2018-03-09: qty 1000

## 2018-03-09 MED ORDER — HEPARIN SODIUM (PORCINE) 1000 UNIT/ML IJ SOLN
INTRAMUSCULAR | Status: AC
  Filled 2018-03-09: qty 1

## 2018-03-09 MED ORDER — ASPIRIN 81 MG PO CHEW: 81.0000 mg | CHEWABLE_TABLET | ORAL | Status: DC

## 2018-03-09 MED ORDER — NITROGLYCERIN 1 MG/10 ML FOR IR/CATH LAB
INTRA_ARTERIAL | Status: AC
  Filled 2018-03-09: qty 10

## 2018-03-09 MED ORDER — LIDOCAINE HCL (PF) 1 % IJ SOLN
INTRAMUSCULAR | Status: AC
  Filled 2018-03-09: qty 30

## 2018-03-09 MED ORDER — VERAPAMIL HCL 2.5 MG/ML IV SOLN
INTRAVENOUS | Status: AC
  Filled 2018-03-09: qty 2

## 2018-03-09 MED ORDER — ASPIRIN 81 MG PO CHEW: 81.0000 mg | CHEWABLE_TABLET | Freq: Every day | ORAL | Status: DC

## 2018-03-09 MED ORDER — SODIUM CHLORIDE 0.9 % WEIGHT BASED INFUSION: 1.0000 mL/kg/h | INTRAVENOUS | Status: DC

## 2018-03-09 MED ORDER — IOHEXOL 350 MG/ML SOLN
INTRAVENOUS | Status: DC | PRN
  Administered 2018-03-09: 70 mL via INTRAVENOUS

## 2018-03-09 MED ORDER — HEPARIN (PORCINE) IN NACL 2-0.9 UNITS/ML
INTRAMUSCULAR | Status: AC | PRN
  Administered 2018-03-09 (×2): 500 mL

## 2018-03-09 MED ORDER — SODIUM CHLORIDE 0.9% FLUSH
3.0000 mL | Freq: Two times a day (BID) | INTRAVENOUS | Status: DC
  Administered 2018-03-10 (×2): 3 mL via INTRAVENOUS

## 2018-03-09 MED ORDER — ACETAMINOPHEN 325 MG PO TABS: 650.0000 mg | ORAL_TABLET | ORAL | Status: DC | PRN

## 2018-03-09 MED ORDER — SODIUM CHLORIDE 0.9 % IV SOLN: 250.0000 mL | INTRAVENOUS | Status: DC | PRN

## 2018-03-09 MED ORDER — MIDAZOLAM HCL 2 MG/2ML IJ SOLN
INTRAMUSCULAR | Status: AC
  Filled 2018-03-09: qty 2

## 2018-03-09 MED ORDER — SODIUM CHLORIDE 0.9 % IV SOLN: INTRAVENOUS | Status: AC

## 2018-03-09 MED ORDER — SODIUM CHLORIDE 0.9% FLUSH: 3.0000 mL | INTRAVENOUS | Status: DC | PRN

## 2018-03-09 MED ORDER — LIDOCAINE HCL (PF) 1 % IJ SOLN
INTRAMUSCULAR | Status: DC | PRN
  Administered 2018-03-09: 2 mL
  Administered 2018-03-09: 17 mL

## 2018-03-09 MED ORDER — SODIUM CHLORIDE 0.9 % WEIGHT BASED INFUSION
3.0000 mL/kg/h | INTRAVENOUS | Status: DC
  Administered 2018-03-09: 3 mL/kg/h via INTRAVENOUS

## 2018-03-09 MED ORDER — ONDANSETRON HCL 4 MG/2ML IJ SOLN: 4.0000 mg | Freq: Four times a day (QID) | INTRAMUSCULAR | Status: DC | PRN

## 2018-03-09 MED ORDER — SODIUM CHLORIDE 0.9% FLUSH: 3.0000 mL | Freq: Two times a day (BID) | INTRAVENOUS | Status: DC

## 2018-03-09 MED ORDER — ASPIRIN 81 MG PO CHEW
81.0000 mg | CHEWABLE_TABLET | ORAL | Status: AC
  Administered 2018-03-09: 81 mg via ORAL
  Filled 2018-03-09: qty 1

## 2018-03-09 MED ORDER — SODIUM CHLORIDE 0.9 % IV SOLN: INTRAVENOUS | Status: DC

## 2018-03-09 MED ORDER — MORPHINE SULFATE (PF) 2 MG/ML IV SOLN: 2.0000 mg | INTRAVENOUS | Status: DC | PRN

## 2018-03-09 MED ORDER — MIDAZOLAM HCL 2 MG/2ML IJ SOLN
INTRAMUSCULAR | Status: DC | PRN
  Administered 2018-03-09: 1 mg via INTRAVENOUS

## 2018-03-09 MED ORDER — HYDROCODONE-ACETAMINOPHEN 5-325 MG PO TABS
1.0000 | ORAL_TABLET | Freq: Four times a day (QID) | ORAL | Status: DC | PRN
  Administered 2018-03-09 – 2018-03-10 (×2): 1 via ORAL
  Filled 2018-03-09 (×2): qty 1

## 2018-03-09 SURGICAL SUPPLY — 12 items
CATH INFINITI 5FR MULTPACK ANG (CATHETERS) ×1 IMPLANT
DEVICE CLOSURE MYNXGRIP 5F (Vascular Products) ×1 IMPLANT
GLIDESHEATH SLEND A-KIT 6F 22G (SHEATH) ×2 IMPLANT
GUIDEWIRE INQWIRE 1.5J.035X260 (WIRE) IMPLANT
INQWIRE 1.5J .035X260CM (WIRE) ×2
KIT HEART LEFT (KITS) ×2 IMPLANT
PACK CARDIAC CATHETERIZATION (CUSTOM PROCEDURE TRAY) ×2 IMPLANT
SHEATH PINNACLE 5F 10CM (SHEATH) ×1 IMPLANT
TRANSDUCER W/STOPCOCK (MISCELLANEOUS) ×2 IMPLANT
TUBING CIL FLEX 10 FLL-RA (TUBING) ×2 IMPLANT
WIRE EMERALD 3MM-J .035X150CM (WIRE) ×1 IMPLANT
WIRE HI TORQ VERSACORE-J 145CM (WIRE) ×1 IMPLANT

## 2018-03-09 NOTE — Plan of Care (Signed)
  Problem: Clinical Measurements: Goal: Respiratory complications will improve Outcome: Progressing   

## 2018-03-09 NOTE — Progress Notes (Addendum)
Progress Note  Patient Name: Latasha [name] Date of Encounter: 03/09/2018  Primary Cardiologist: Quay Burow, MD   Subjective   Patient doing okay this AM but experienced some substernal dull chest pain this AM that resolved with SL nitro. EKG was obtained. She understands the risks of cardiac cath and the possible outcomes. All questions and concerns addressed.   Inpatient Medications    Scheduled Meds: . amLODipine  10 mg Oral Daily  . aspirin EC  81 mg Oral Daily  . atorvastatin  40 mg Oral Daily  . carvedilol  25 mg Oral BID WC  . gabapentin  100 mg Oral TID  . heparin  5,000 Units Subcutaneous Q8H  . insulin aspart  0-20 Units Subcutaneous TID WC  . insulin aspart  0-5 Units Subcutaneous QHS  . insulin aspart  6 Units Subcutaneous TID WC  . insulin NPH Human  20 Units Subcutaneous BID AC & HS   Continuous Infusions:  PRN Meds: acetaminophen, albuterol, alum & mag hydroxide-simeth, clonazepam, fentaNYL (SUBLIMAZE) injection, nitroGLYCERIN, ondansetron (ZOFRAN) IV   Vital Signs    Vitals:   03/08/18 1924 03/09/18 0459 03/09/18 0813 03/09/18 0815  BP: (!) 154/81 (!) 152/76 (!) 158/72   Pulse: 69 72  74  Resp: 18 20    Temp: 98.2 F (36.8 C) 98.3 F (36.8 C)    TempSrc: Oral Oral    SpO2: 92% 96%    Weight:  240 lb 14.4 oz (109.3 kg)    Height:        Intake/Output Summary (Last 24 hours) at 03/09/2018 0838 Last data filed at 03/08/2018 1900 Gross per 24 hour  Intake 478 ml  Output 400 ml  Net 78 ml   Filed Weights   03/07/18 1552 03/08/18 0444 03/09/18 0459  Weight: 239 lb (108.4 kg) 238 lb 6.4 oz (108.1 kg) 240 lb 14.4 oz (109.3 kg)    Telemetry    Normal sinus rhythm - Personally Reviewed  ECG    Sinus rhythm with normal intervals. Left axis deviation. TWI in leads I and AvL. Consistent with EKG on 03/08/18 - Personally Reviewed  Physical Exam   General: Obese female in no acute distress HENT: Normocephalic, atraumatic, moist mucus  membranes Pulm: Good air movement with no wheezing or crackles  CV: RRR, no murmurs, no rubs  Abdomen: Active bowel sounds, soft, non-distended Extremities: No LE edema  Skin: Warm and dry  Neuro: Alert and oriented x 3  Labs    Chemistry Recent Labs  Lab 03/07/18 1606 03/07/18 2010 03/09/18 0403  NA 138  --  137  K 4.3  --  4.2  CL 101  --  105  CO2 27  --  27  GLUCOSE 313*  --  290*  BUN 51*  --  50*  CREATININE 2.17*  --  2.40*  CALCIUM 9.1  --  8.2*  PROT  --  7.5 6.7  ALBUMIN  --  3.5 3.0*  AST  --  20 18  ALT  --  24 21  ALKPHOS  --  93 77  BILITOT  --  0.6 0.5  GFRNONAA 23*  --  21*  GFRAA 27*  --  24*  ANIONGAP 10  --  5    Hematology Recent Labs  Lab 03/07/18 1606 03/09/18 0403  WBC 5.1 4.5  RBC 4.21 3.77*  HGB 11.1* 10.1*  HCT 34.8* 31.6*  MCV 82.7 83.8  MCH 26.4 26.8  MCHC 31.9 32.0  RDW 13.5 13.5  PLT 270 232   Cardiac Enzymes Recent Labs  Lab 03/07/18 2010 03/07/18 2230 03/08/18 0055  TROPONINI <0.03 <0.03 <0.03    Recent Labs  Lab 03/07/18 1634 03/07/18 2021  TROPIPOC 0.01 0.01    BNPNo results for input(s): BNP, PROBNP in the last 168 hours.   DDimer No results for input(s): DDIMER in the last 168 hours.   Radiology    Dg Chest 2 View  Result Date: 03/07/2018 CLINICAL DATA:  Left-sided chest pain for several hours EXAM: CHEST - 2 VIEW COMPARISON:  06/09/2017 FINDINGS: The heart size and mediastinal contours are within normal limits. Both lungs are clear. The visualized skeletal structures are unremarkable. IMPRESSION: No active cardiopulmonary disease. Electronically Signed   By: Inez Catalina M.D.   On: 03/07/2018 16:32   Nm Myocar Multi W/spect W/wall Motion / Ef  Result Date: 03/08/2018 CLINICAL DATA:  Chest pain. EXAM: MYOCARDIAL IMAGING WITH SPECT (REST AND PHARMACOLOGIC-STRESS) GATED LEFT VENTRICULAR WALL MOTION STUDY LEFT VENTRICULAR EJECTION FRACTION TECHNIQUE: Standard myocardial SPECT imaging was performed after resting  intravenous injection of 10 mCi Tc-73m tetrofosmin. Subsequently, intravenous infusion of Lexiscan was performed under the supervision of the Cardiology staff. At peak effect of the drug, 30 mCi Tc-30m tetrofosmin was injected intravenously and standard myocardial SPECT imaging was performed. Quantitative gated imaging was also performed to evaluate left ventricular wall motion, and estimate left ventricular ejection fraction. COMPARISON:  None. FINDINGS: Perfusion: There is a moderate sized region of decreased uptake in the inferior/inferolateral wall on stress images only. No other focal perfusion abnormalities identified. Wall Motion: Normal left ventricular wall motion. No left ventricular dilation. Left Ventricular Ejection Fraction: 51 % End diastolic volume 094 ml End systolic volume 69 ml IMPRESSION: 1. There is a moderate sized reversible defect in the inferior/inferolateral wall suggestive of ischemia. 2. Normal left ventricular wall motion. 3. Left ventricular ejection fraction 51% 4. Non invasive risk stratification*: Intermediate *2012 Appropriate Use Criteria for Coronary Revascularization Focused Update: J Am Coll Cardiol. 7096;28(3):662-947. http://[email or web address]?[email or web address] Electronically Signed   By: Dorise Bullion III M.D   On: 03/08/2018 13:02   Cardiac Studies   Lexiscan Myoview 03/08/18 1. There is a moderate sized reversible defect in the inferior/inferolateral wall suggestive of ischemia. 2. Normal left ventricular wall motion. 3. Left ventricular ejection fraction 51% 4. Non invasive risk stratification*: Intermediate  Patient Profile     62 y.o. female with HTN. HFpEF, DM with microvascular complications, and CKD stage IV who presented to the ED with typical and atypical chest pain of acute onset. Troponin negative x 3, and EKG with new TWI in leads I and AvL. NM stress test with moderate reversible defect in the inferior/inferolateral wall suggestive  of ischemia.   Assessment & Plan    Typical / Atypical Chest Pain  - Risk factors: HTN. HFpEF, DM with microvascular complications, and CKD stage IV - Troponin negative x 3  - EKG this AM stable compared to prior  - NM Stress test on 03/08/18 with moderate reversible defect in the inferior/inferolateral wall suggestive of ischemia.  - Plan for left heart cath today. Patient understands risks/benefits and possible outcomes. Will do modified pre- and post-cath hydration to decrease risk of CIN. Appreciate nephrology input  - Continue ASA and Coreg 25 mg BID - Can add Isosorbide mononitrate 60 mg QD for ongoing CP  Hypertension - Continue Amlodipine 10 mg QD and Coreg 25 mg BID - Room to add Isosorbide mononitrate 60 mg QD  Diabetes mellitus with microvascular complications. - CBGs improving  - Management per primary   Hyperlipidemia - Continuing Atorvastatin 40 mg QD  Chronic kidney disease stage IV - Creatinine stable  - Appreciate Nephrology input  HFpEF, stable. - Appears euvolemic today  - Holding diureses in preparation of left heart cath  - Will do modified pre- and post cardiac hydration to prevent volume overload and hopefully reduce risk of CIN  For questions or updates, please contact [place] Please consult [email or web address] for contact info under Cardiology/STEMI.     Signed, Ina Homes, MD  03/09/2018, 8:38 AM  .  Attending Note:   The patient was seen and examined.  Agree with assessment and plan as noted above.  Changes made to the above note as needed.  Patient seen and independently examined with Ina Homes, MD ( IM  resident ) .   We discussed all aspects of the encounter. I agree with the assessment and plan as stated above.  1.  Chest discomfort: Patient presented with some atypical episodes of chest discomfort.  Myoview study showed a lateral wall defect.  Cardiac enzymes are negative.  It is possible at this defect in the Myoview study is  shifting breast artifact but it also could be ischemia.  She has multiple risk factors for coronary artery disease including hypertension, hyperlipidemia, and diabetes mellitus.  She is scheduled for heart catheterization today.  We have discussed the risks, benefits, options.  She understands and agrees to proceed. Her creatinine today is 2.4.  We will start IV fluids.  She has already been seen by nephrology.   She understands the risk of worsening renal insufficiency .   2.   HTN:   BP is a bit elevated today . Lasix has been held.  Will not start any medications today prior to cath but will be more aggressive starting tomorrow with her antihypertensives.  3.  Diabetes mellitus: Plans per internal medicine team.  4.  Hyperlipidemia: Her LDL this morning is 53.  Total cholesterol is 102.  HDL is 26.  Continue current medications.   I have spent a total of 40 minutes with patient reviewing hospital  notes , telemetry, EKGs, labs and examining patient as well as establishing an assessment and plan that was discussed with the patient. > 50% of time was spent in direct patient care.    Thayer Headings, Brooke Bonito., MD, [facility] 03/09/2018, 9:41 AM [address],  [place] Pager [phone]

## 2018-03-09 NOTE — Progress Notes (Signed)
Inpatient Diabetes Program Recommendations  AACE/ADA: New Consensus Statement on Inpatient Glycemic Control (2015)  Target Ranges:  Prepandial:   less than 140 mg/dL      Peak postprandial:   less than 180 mg/dL (1-2 hours)      Critically ill patients:  140 - 180 mg/dL   Lab Results  Component Value Date   GLUCAP 237 (H) 03/09/2018   HGBA1C 11.5 (H) 03/09/2018    Review of Glycemic Control  Diabetes history: DM 2 Outpatient Diabetes medications: NPH 40 units BID Current orders for Inpatient glycemic control: NPH 20 units BID, Novolog Resistant Correction 0-20 units tid + Novolog HS scale 0-5 units + Novolog 6 units tid meal coverage  A1c this admission 11.5%  Inpatient Diabetes Program Recommendations:    Glucose trending downward but still in the 200's. Due to renal function, Consider increasing NPH to 25 units BID and reduce Correction scale to Novolog Sensitive (0-9) or Moderate Correction (0-15) to be given with the meal coverage when eating.  Thanks,  Tama Headings RN, MSN, BC-ADM, [facility] Inpatient Diabetes Coordinator Team Pager [phone] (8a-5p)

## 2018-03-09 NOTE — Interval H&P Note (Signed)
Cath Lab Visit (complete for each Cath Lab visit)  Clinical Evaluation Leading to the Procedure:   ACS: Yes.    Non-ACS:    Anginal Classification: CCS III  Anti-ischemic medical therapy: Minimal Therapy (1 class of medications)  Non-Invasive Test Results: Intermediate-risk stress test findings: cardiac mortality 1-3%/year  Prior CABG: No previous CABG      History and Physical Interval Note:  03/09/2018 2:51 PM  Latasha [name]  has presented today for surgery, with the diagnosis of cp  The various methods of treatment have been discussed with the patient and family. After consideration of risks, benefits and other options for treatment, the patient has consented to  Procedure(s): LEFT HEART CATH AND CORONARY ANGIOGRAPHY (N/A) as a surgical intervention .  The patient's history has been reviewed, patient examined, no change in status, stable for surgery.  I have reviewed the patient's chart and labs.  Questions were answered to the patient's satisfaction.     Quay Burow

## 2018-03-09 NOTE — H&P (View-Only) (Signed)
Progress Note  Patient Name: Latasha [name] Date of Encounter: 03/09/2018  Primary Cardiologist: Quay Burow, MD   Subjective   Patient doing okay this AM but experienced some substernal dull chest pain this AM that resolved with SL nitro. EKG was obtained. She understands the risks of cardiac cath and the possible outcomes. All questions and concerns addressed.   Inpatient Medications    Scheduled Meds: . amLODipine  10 mg Oral Daily  . aspirin EC  81 mg Oral Daily  . atorvastatin  40 mg Oral Daily  . carvedilol  25 mg Oral BID WC  . gabapentin  100 mg Oral TID  . heparin  5,000 Units Subcutaneous Q8H  . insulin aspart  0-20 Units Subcutaneous TID WC  . insulin aspart  0-5 Units Subcutaneous QHS  . insulin aspart  6 Units Subcutaneous TID WC  . insulin NPH Human  20 Units Subcutaneous BID AC & HS   Continuous Infusions:  PRN Meds: acetaminophen, albuterol, alum & mag hydroxide-simeth, clonazepam, fentaNYL (SUBLIMAZE) injection, nitroGLYCERIN, ondansetron (ZOFRAN) IV   Vital Signs    Vitals:   03/08/18 1924 03/09/18 0459 03/09/18 0813 03/09/18 0815  BP: (!) 154/81 (!) 152/76 (!) 158/72   Pulse: 69 72  74  Resp: 18 20    Temp: 98.2 F (36.8 C) 98.3 F (36.8 C)    TempSrc: Oral Oral    SpO2: 92% 96%    Weight:  240 lb 14.4 oz (109.3 kg)    Height:        Intake/Output Summary (Last 24 hours) at 03/09/2018 0838 Last data filed at 03/08/2018 1900 Gross per 24 hour  Intake 478 ml  Output 400 ml  Net 78 ml   Filed Weights   03/07/18 1552 03/08/18 0444 03/09/18 0459  Weight: 239 lb (108.4 kg) 238 lb 6.4 oz (108.1 kg) 240 lb 14.4 oz (109.3 kg)    Telemetry    Normal sinus rhythm - Personally Reviewed  ECG    Sinus rhythm with normal intervals. Left axis deviation. TWI in leads I and AvL. Consistent with EKG on 03/08/18 - Personally Reviewed  Physical Exam   General: Obese female in no acute distress HENT: Normocephalic, atraumatic, moist mucus  membranes Pulm: Good air movement with no wheezing or crackles  CV: RRR, no murmurs, no rubs  Abdomen: Active bowel sounds, soft, non-distended Extremities: No LE edema  Skin: Warm and dry  Neuro: Alert and oriented x 3  Labs    Chemistry Recent Labs  Lab 03/07/18 1606 03/07/18 2010 03/09/18 0403  NA 138  --  137  K 4.3  --  4.2  CL 101  --  105  CO2 27  --  27  GLUCOSE 313*  --  290*  BUN 51*  --  50*  CREATININE 2.17*  --  2.40*  CALCIUM 9.1  --  8.2*  PROT  --  7.5 6.7  ALBUMIN  --  3.5 3.0*  AST  --  20 18  ALT  --  24 21  ALKPHOS  --  93 77  BILITOT  --  0.6 0.5  GFRNONAA 23*  --  21*  GFRAA 27*  --  24*  ANIONGAP 10  --  5    Hematology Recent Labs  Lab 03/07/18 1606 03/09/18 0403  WBC 5.1 4.5  RBC 4.21 3.77*  HGB 11.1* 10.1*  HCT 34.8* 31.6*  MCV 82.7 83.8  MCH 26.4 26.8  MCHC 31.9 32.0  RDW 13.5 13.5  PLT 270 232   Cardiac Enzymes Recent Labs  Lab 03/07/18 2010 03/07/18 2230 03/08/18 0055  TROPONINI <0.03 <0.03 <0.03    Recent Labs  Lab 03/07/18 1634 03/07/18 2021  TROPIPOC 0.01 0.01    BNPNo results for input(s): BNP, PROBNP in the last 168 hours.   DDimer No results for input(s): DDIMER in the last 168 hours.   Radiology    Dg Chest 2 View  Result Date: 03/07/2018 CLINICAL DATA:  Left-sided chest pain for several hours EXAM: CHEST - 2 VIEW COMPARISON:  06/09/2017 FINDINGS: The heart size and mediastinal contours are within normal limits. Both lungs are clear. The visualized skeletal structures are unremarkable. IMPRESSION: No active cardiopulmonary disease. Electronically Signed   By: Inez Catalina M.D.   On: 03/07/2018 16:32   Nm Myocar Multi W/spect W/wall Motion / Ef  Result Date: 03/08/2018 CLINICAL DATA:  Chest pain. EXAM: MYOCARDIAL IMAGING WITH SPECT (REST AND PHARMACOLOGIC-STRESS) GATED LEFT VENTRICULAR WALL MOTION STUDY LEFT VENTRICULAR EJECTION FRACTION TECHNIQUE: Standard myocardial SPECT imaging was performed after resting  intravenous injection of 10 mCi Tc-55m tetrofosmin. Subsequently, intravenous infusion of Lexiscan was performed under the supervision of the Cardiology staff. At peak effect of the drug, 30 mCi Tc-53m tetrofosmin was injected intravenously and standard myocardial SPECT imaging was performed. Quantitative gated imaging was also performed to evaluate left ventricular wall motion, and estimate left ventricular ejection fraction. COMPARISON:  None. FINDINGS: Perfusion: There is a moderate sized region of decreased uptake in the inferior/inferolateral wall on stress images only. No other focal perfusion abnormalities identified. Wall Motion: Normal left ventricular wall motion. No left ventricular dilation. Left Ventricular Ejection Fraction: 51 % End diastolic volume 706 ml End systolic volume 69 ml IMPRESSION: 1. There is a moderate sized reversible defect in the inferior/inferolateral wall suggestive of ischemia. 2. Normal left ventricular wall motion. 3. Left ventricular ejection fraction 51% 4. Non invasive risk stratification*: Intermediate *2012 Appropriate Use Criteria for Coronary Revascularization Focused Update: J Am Coll Cardiol. 2376;28(3):151-761. http://[email or web address]?[email or web address] Electronically Signed   By: Dorise Bullion III M.D   On: 03/08/2018 13:02   Cardiac Studies   Lexiscan Myoview 03/08/18 1. There is a moderate sized reversible defect in the inferior/inferolateral wall suggestive of ischemia. 2. Normal left ventricular wall motion. 3. Left ventricular ejection fraction 51% 4. Non invasive risk stratification*: Intermediate  Patient Profile     62 y.o. female with HTN. HFpEF, DM with microvascular complications, and CKD stage IV who presented to the ED with typical and atypical chest pain of acute onset. Troponin negative x 3, and EKG with new TWI in leads I and AvL. NM stress test with moderate reversible defect in the inferior/inferolateral wall suggestive  of ischemia.   Assessment & Plan    Typical / Atypical Chest Pain  - Risk factors: HTN. HFpEF, DM with microvascular complications, and CKD stage IV - Troponin negative x 3  - EKG this AM stable compared to prior  - NM Stress test on 03/08/18 with moderate reversible defect in the inferior/inferolateral wall suggestive of ischemia.  - Plan for left heart cath today. Patient understands risks/benefits and possible outcomes. Will do modified pre- and post-cath hydration to decrease risk of CIN. Appreciate nephrology input  - Continue ASA and Coreg 25 mg BID - Can add Isosorbide mononitrate 60 mg QD for ongoing CP  Hypertension - Continue Amlodipine 10 mg QD and Coreg 25 mg BID - Room to add Isosorbide mononitrate 60 mg QD  Diabetes mellitus with microvascular complications. - CBGs improving  - Management per primary   Hyperlipidemia - Continuing Atorvastatin 40 mg QD  Chronic kidney disease stage IV - Creatinine stable  - Appreciate Nephrology input  HFpEF, stable. - Appears euvolemic today  - Holding diureses in preparation of left heart cath  - Will do modified pre- and post cardiac hydration to prevent volume overload and hopefully reduce risk of CIN  For questions or updates, please contact [place] Please consult [email or web address] for contact info under Cardiology/STEMI.     Signed, Ina Homes, MD  03/09/2018, 8:38 AM  .  Attending Note:   The patient was seen and examined.  Agree with assessment and plan as noted above.  Changes made to the above note as needed.  Patient seen and independently examined with Ina Homes, MD ( IM  resident ) .   We discussed all aspects of the encounter. I agree with the assessment and plan as stated above.  1.  Chest discomfort: Patient presented with some atypical episodes of chest discomfort.  Myoview study showed a lateral wall defect.  Cardiac enzymes are negative.  It is possible at this defect in the Myoview study is  shifting breast artifact but it also could be ischemia.  She has multiple risk factors for coronary artery disease including hypertension, hyperlipidemia, and diabetes mellitus.  She is scheduled for heart catheterization today.  We have discussed the risks, benefits, options.  She understands and agrees to proceed. Her creatinine today is 2.4.  We will start IV fluids.  She has already been seen by nephrology.   She understands the risk of worsening renal insufficiency .   2.   HTN:   BP is a bit elevated today . Lasix has been held.  Will not start any medications today prior to cath but will be more aggressive starting tomorrow with her antihypertensives.  3.  Diabetes mellitus: Plans per internal medicine team.  4.  Hyperlipidemia: Her LDL this morning is 53.  Total cholesterol is 102.  HDL is 26.  Continue current medications.   I have spent a total of 40 minutes with patient reviewing hospital  notes , telemetry, EKGs, labs and examining patient as well as establishing an assessment and plan that was discussed with the patient. > 50% of time was spent in direct patient care.    Thayer Headings, Brooke Bonito., MD, [facility] 03/09/2018, 9:41 AM [address]  [place] Pager [phone]

## 2018-03-09 NOTE — Progress Notes (Addendum)
PROGRESS NOTE  Latasha [name] KVQ:[id number] DOB: [birth year] DOA: 03/07/2018 PCP: Shirline Frees, MD  HPI/Recap of past 24 hours:  Anxious, currently no pain, She is waiting to have cardiac cath done. Husband at bedside  Assessment/Plan: Principal Problem:   Chest pain, rule out acute myocardial infarction Active Problems:   Insulin-requiring or dependent type II diabetes mellitus (HCC)   Essential hypertension   Chronic diastolic CHF (congestive heart failure) (HCC)   CKD (chronic kidney disease) stage 4, GFR 15-29 ml/min (HCC)  Chest pain: Negative troponin or acute EKG changes Intermediate risk stress test with moderate reversible defect in the inferior/inferolateral wall suggestive of ischemia. Cardiology plan for cardiac cath, cardiology recommends nephrology consult due to concerns of contrast nephropathy.  hypertensive heart disease with chronic diastolic heart failure continue norvasc, coreg Home meds lasix held due to anticipating cardiac cath Currently on room air  CKDIV -she is followed by Dr Florene Glen -cr at baseline  -nephrology consulted  IDDM2  a1c 11 Blood glucose elevated,  Continue long acting insulin, add meal coverage and ssi (achs)  Morbid obesity:  she never had sleep study Body mass index is 40.71 kg/m.  Code Status: full  Family Communication: patient and husband  Disposition Plan: needs cardiology clearance for discharge   Consultants:  Cardiology  nephrology  Procedures:  Cardiac nuc stress test on 6/2  Cath planned on 6/3  Antibiotics:  none   Objective: BP (!) 159/88   Pulse (!) 0   Temp 98.3 F (36.8 C) (Oral)   Resp 15   Ht 5' 4.5" (1.638 m)   Wt 109.3 kg (240 lb 14.4 oz)   SpO2 (!) 0%   BMI 40.71 kg/m   Intake/Output Summary (Last 24 hours) at 03/09/2018 1558 Last data filed at 03/09/2018 0900 Gross per 24 hour  Intake 360 ml  Output 400 ml  Net -40 ml   Filed Weights   03/07/18 1552 03/08/18 0444  03/09/18 0459  Weight: 108.4 kg (239 lb) 108.1 kg (238 lb 6.4 oz) 109.3 kg (240 lb 14.4 oz)    Exam: Patient is examined daily including today on 03/09/2018, exams remain the same as of yesterday except that has changed    General:  NAD, obese  Cardiovascular: RRR  Respiratory: CTABL  Abdomen: Soft/ND/NT, positive BS  Musculoskeletal: No Edema  Neuro: alert, oriented   Data Reviewed: Basic Metabolic Panel: Recent Labs  Lab 03/07/18 1606 03/09/18 0403  NA 138 137  K 4.3 4.2  CL 101 105  CO2 27 27  GLUCOSE 313* 290*  BUN 51* 50*  CREATININE 2.17* 2.40*  CALCIUM 9.1 8.2*   Liver Function Tests: Recent Labs  Lab 03/07/18 2010 03/09/18 0403  AST 20 18  ALT 24 21  ALKPHOS 93 77  BILITOT 0.6 0.5  PROT 7.5 6.7  ALBUMIN 3.5 3.0*   No results for input(s): LIPASE, AMYLASE in the last 168 hours. No results for input(s): AMMONIA in the last 168 hours. CBC: Recent Labs  Lab 03/07/18 1606 03/09/18 0403  WBC 5.1 4.5  HGB 11.1* 10.1*  HCT 34.8* 31.6*  MCV 82.7 83.8  PLT 270 232   Cardiac Enzymes:   Recent Labs  Lab 03/07/18 2010 03/07/18 2230 03/08/18 0055  TROPONINI <0.03 <0.03 <0.03   BNP (last 3 results) Recent Labs    06/07/17 1317  BNP 141.0*    ProBNP (last 3 results) No results for input(s): PROBNP in the last 8760 hours.  CBG: Recent Labs  Lab  03/08/18 2211 03/09/18 0031 03/09/18 0454 03/09/18 0758 03/09/18 1126  GLUCAP 389* 339* 252* 237* 179*    No results found for this or any previous visit (from the past 240 hour(s)).   Studies: No results found.  Scheduled Meds: . amLODipine  10 mg Oral Daily  . aspirin EC  81 mg Oral Daily  . atorvastatin  40 mg Oral Daily  . carvedilol  25 mg Oral BID WC  . gabapentin  100 mg Oral TID  . heparin  5,000 Units Subcutaneous Q8H  . insulin aspart  0-20 Units Subcutaneous TID WC  . insulin aspart  0-5 Units Subcutaneous QHS  . insulin aspart  6 Units Subcutaneous TID WC  . insulin NPH  Human  20 Units Subcutaneous BID AC & HS    Continuous Infusions:    Time spent: 77mins,  I have personally reviewed and interpreted on  03/09/2018 daily labs, tele strips, imagings as discussed above under date review session and assessment and plans.  I reviewed all nursing notes, pharmacy notes, consultant notes,  vitals, pertinent old records  I have discussed plan of care as described above with RN , patient and family on 03/09/2018   Florencia Reasons MD, PhD  [facility] Pager [phone]. If 7PM-7AM, please contact night-coverage at [email or web address], password [facility] 03/09/2018, 3:58 PM  LOS: 0 days

## 2018-03-09 NOTE — Progress Notes (Signed)
Inpatient Diabetes Program Recommendations  AACE/ADA: New Consensus Statement on Inpatient Glycemic Control (2015)  Target Ranges:  Prepandial:   less than 140 mg/dL      Peak postprandial:   less than 180 mg/dL (1-2 hours)      Critically ill patients:  140 - 180 mg/dL    Spoke with patient about diabetes and home regimen for diabetes control. Patient reports that she is followed by Dr. Geoffery Lyons, Endocrinologist with [facility] for DM  Management and was placed on [place] at last visit in May. Patient stated she was able to take it for a month before insurance would not cover it. Patient said she had not met her $7,000 deductible yet, and that to keep from being penalized, she signed up for the first plan on the Marketplace website this year, which she says was a big mistake. Patient is hopeful in November to change plans for better coverage. Patient reports better glucose trends on the [facility] but says currently she is taking [employer] NPH 40 units BID.  Discussed A1C results 11.5% this admission. Patient reports having great resources, such as a dietitian that calls and checks in on her. Patient exercises 6 days out of the week, but struggles to loose weight. Patient verbalized understanding of information. Patient has an appointment with Dr. Hartford Poli next month. Patient will cal Dr. Shirlyn Goltz office in the next couple of days to inform him she was not able to continue [place].   Thanks,  Tama Headings RN, MSN, [facility] Inpatient Diabetes Coordinator Team Pager [phone] (8a-5p)

## 2018-03-10 ENCOUNTER — Encounter (HOSPITAL_COMMUNITY): Payer: Self-pay | Admitting: Cardiovascular Disease

## 2018-03-10 DIAGNOSIS — N184 Chronic kidney disease, stage 4 (severe): Secondary | ICD-10-CM | POA: Diagnosis not present

## 2018-03-10 DIAGNOSIS — R079 Chest pain, unspecified: Secondary | ICD-10-CM | POA: Diagnosis not present

## 2018-03-10 LAB — GLUCOSE, CAPILLARY
GLUCOSE-CAPILLARY: 222 mg/dL — AB (ref 65–99)
Glucose-Capillary: 195 mg/dL — ABNORMAL HIGH (ref 65–99)
Glucose-Capillary: 277 mg/dL — ABNORMAL HIGH (ref 65–99)

## 2018-03-10 LAB — BASIC METABOLIC PANEL
Anion gap: 9 (ref 5–15)
BUN: 45 mg/dL — ABNORMAL HIGH (ref 6–20)
CHLORIDE: 104 mmol/L (ref 101–111)
CO2: 25 mmol/L (ref 22–32)
CREATININE: 2.07 mg/dL — AB (ref 0.44–1.00)
Calcium: 8.5 mg/dL — ABNORMAL LOW (ref 8.9–10.3)
GFR, EST AFRICAN AMERICAN: 29 mL/min — AB (ref >60)
GFR, EST NON AFRICAN AMERICAN: 25 mL/min — AB (ref >60)
Glucose, Bld: 222 mg/dL — ABNORMAL HIGH (ref 65–99)
Potassium: 4.2 mmol/L (ref 3.5–5.1)
SODIUM: 138 mmol/L (ref 135–145)

## 2018-03-10 LAB — CBC
HCT: 30.7 % — ABNORMAL LOW (ref 36.0–46.0)
HEMOGLOBIN: 9.7 g/dL — AB (ref 12.0–15.0)
MCH: 26.9 pg (ref 26.0–34.0)
MCHC: 31.6 g/dL (ref 30.0–36.0)
MCV: 85 fL (ref 78.0–100.0)
Platelets: 224 10*3/uL (ref 150–400)
RBC: 3.61 MIL/uL — AB (ref 3.87–5.11)
RDW: 13.5 % (ref 11.5–15.5)
WBC: 4.7 10*3/uL (ref 4.0–10.5)

## 2018-03-10 MED ORDER — HYDRALAZINE HCL 25 MG PO TABS: 25.0000 mg | ORAL_TABLET | Freq: Three times a day (TID) | ORAL | 0 refills | Status: DC

## 2018-03-10 MED ORDER — DIPHENHYDRAMINE HCL 25 MG PO CAPS: 25.0000 mg | ORAL_CAPSULE | Freq: Four times a day (QID) | ORAL | 0 refills | Status: DC | PRN

## 2018-03-10 MED ORDER — DIPHENHYDRAMINE HCL 25 MG PO CAPS
25.0000 mg | ORAL_CAPSULE | Freq: Four times a day (QID) | ORAL | Status: DC | PRN
  Administered 2018-03-10: 25 mg via ORAL
  Filled 2018-03-10: qty 1

## 2018-03-10 MED ORDER — HYDROCODONE-ACETAMINOPHEN 5-325 MG PO TABS: 1.0000 | ORAL_TABLET | Freq: Four times a day (QID) | ORAL | 0 refills | Status: DC | PRN

## 2018-03-10 MED ORDER — CARVEDILOL 25 MG PO TABS: 25.0000 mg | ORAL_TABLET | Freq: Two times a day (BID) | ORAL | 0 refills | Status: AC

## 2018-03-10 MED ORDER — HYDRALAZINE HCL 25 MG PO TABS: 25.0000 mg | ORAL_TABLET | Freq: Three times a day (TID) | ORAL | Status: DC

## 2018-03-10 MED ORDER — VALACYCLOVIR HCL 500 MG PO TABS: 500.0000 mg | ORAL_TABLET | Freq: Three times a day (TID) | ORAL | Status: DC

## 2018-03-10 MED ORDER — VALACYCLOVIR HCL 1 G PO TABS: 1000.0000 mg | ORAL_TABLET | Freq: Two times a day (BID) | ORAL | 0 refills | Status: AC

## 2018-03-10 MED ORDER — VALACYCLOVIR HCL 500 MG PO TABS
1000.0000 mg | ORAL_TABLET | Freq: Two times a day (BID) | ORAL | Status: DC
  Administered 2018-03-10: 1000 mg via ORAL
  Filled 2018-03-10: qty 2

## 2018-03-10 MED ORDER — NITROGLYCERIN 0.4 MG SL SUBL: 0.4000 mg | SUBLINGUAL_TABLET | SUBLINGUAL | 0 refills | Status: DC | PRN

## 2018-03-10 NOTE — Plan of Care (Signed)
  Problem: Health Behavior/Discharge Planning: Goal: Ability to manage health-related needs will improve Outcome: Progressing   

## 2018-03-10 NOTE — Discharge Summary (Signed)
Discharge Summary  CHARNELLE BERGEMAN CVE:[id number] DOB: 04-04-1956  PCP: Shirline Frees, MD  Admit date: 03/07/2018 Discharge date: 03/10/2018  Time spent: <2mins  Recommendations for Outpatient Follow-up:  1. F/u with PMD within a week  for hospital discharge follow up, repeat cbc/bmp at follow up. 2. F/u with cardiology Dr [name] 3. F/u with nephrology Dr Florene Glen  Discharge Diagnoses:  Active Hospital Problems   Diagnosis Date Noted  . Chest pain, rule out acute myocardial infarction 03/07/2018  . CKD (chronic kidney disease) stage 4, GFR 15-29 ml/min (HCC) 03/07/2018  . Chronic diastolic CHF (congestive heart failure) ([place]) 06/07/2017  . Essential hypertension 08/24/2009  . Insulin-requiring or dependent type II diabetes mellitus ([place]) 10/08/1991    Resolved Hospital Problems  No resolved problems to display.    Discharge Condition: stable  Diet recommendation: heart healthy/carb modified  Filed Weights   03/08/18 0444 03/09/18 0459 03/10/18 0340  Weight: 108.1 kg (238 lb 6.4 oz) 109.3 kg (240 lb 14.4 oz) 110.3 kg (243 lb 1.6 oz)    History of present illness: (per admitting MD Dr Alcario Drought) PCP: Shirline Frees, MD  Patient coming from: Home  I have personally briefly reviewed patient's old medical records in [place]  Chief Complaint: Chest pain  HPI: Latasha [name] is a 62 y.o. female with medical history significant of DM2, HTN, CKD stage 4, HLD, obesity, hypertensive cardiomyopathy / diastolic CHF.  Patient presents to the ED with c/o chest, abd, back pain.  Took mylanta, baking soda, water, zantac, ibuprofen.  Back and abd pain resolved, L sided CP remains.  Constant pressure.  8/10 intensity still.  Intermittently sharp.   ED Course: Trop neg.  Hospitalist asked to admit.     Hospital Course:  Principal Problem:   Chest pain, rule out acute myocardial infarction Active Problems:   Insulin-requiring or dependent type II diabetes mellitus  (HCC)   Essential hypertension   Chronic diastolic CHF (congestive heart failure) (HCC)   CKD (chronic kidney disease) stage 4, GFR 15-29 ml/min (HCC)   Chest pain: Negative troponin or acute EKG changes Intermediate risk stress test with moderate reversible defect in the inferior/inferolateral wall suggestive of ischemia. cardiology recommends nephrology consult due to concerns of contrast nephropathy. Nephrology input appreciated cardiac cath unremarkable,   Singles: -dermatomal rash on her upper back consistent with acute shingles.  Likely cause of her shooting, stabbing chest pain given normal coronaries per cardiac cath -she is started on valtrex renal dosing -she is on neurontin, she is prescribed prn norco as well for pain control -she is to follow up with pmd in one week, repeat bmp at follow up.    HTN: blood pressure not well controlled She is discharged on increased dose of coreg (25mg  bid) , she is started on hydralazine 25mg  tid She is to continue norvasc and lasix She is to follow up with pmd and cardiology  hypertensive heart disease with chronic diastolic heart failure continue norvasc, coreg  CKDIV -she is followed by Dr Florene Glen -cr at baseline  -nephrology consulted  Anemia of chronic disease hgb at baseline around 10  IDDM2  a1c 11 Blood glucose elevated,  Continue long acting insulin, add meal coverage and ssi (achs)  Morbid obesity:  she never had sleep study Body mass index is 40.71 kg/m.  Code Status: full  Family Communication: patient and husband  Disposition Plan: d/c home   Consultants:  Cardiology  nephrology  Procedures:  Cardiac nuc stress test on 6/2  Cardiac Cath  on 6/3  Antibiotics:  none   Discharge Exam: BP (!) 164/72 (BP Location: Left Arm)   Pulse 76   Temp 97.7 F (36.5 C) (Oral)   Resp 16   Ht 5' 4.5" (1.638 m)   Wt 110.3 kg (243 lb 1.6 oz)   SpO2 94%   BMI 41.08 kg/m   General: NAD,  obese Cardiovascular: RRR Respiratory: CTABL Skin: dermatomal rash on her upper back consistent with acute shingles  Discharge Instructions You were cared for by a hospitalist during your hospital stay. If you have any questions about your discharge medications or the care you received while you were in the hospital after you are discharged, you can call the unit and asked to speak with the hospitalist on call if the hospitalist that took care of you is not available. Once you are discharged, your primary care physician will handle any further medical issues. Please note that NO REFILLS for any discharge medications will be authorized once you are discharged, as it is imperative that you return to your primary care physician (or establish a relationship with a primary care physician if you do not have one) for your aftercare needs so that they can reassess your need for medications and monitor your lab values.  Discharge Instructions    Diet - low sodium heart healthy   Complete by:  As directed    Carb modified   Increase activity slowly   Complete by:  As directed      Allergies as of 03/10/2018   No Known Allergies     Medication List    TAKE these medications   albuterol 108 (90 Base) MCG/ACT inhaler Commonly known as:  PROVENTIL HFA;VENTOLIN HFA Inhale 2 puffs into the lungs every 6 (six) hours as needed for wheezing or shortness of breath.   alum & mag hydroxide-simeth 200-200-20 MG/5ML suspension Commonly known as:  MAALOX/MYLANTA Take 30 mLs by mouth every 6 (six) hours as needed for indigestion or heartburn.   amLODipine 10 MG tablet Commonly known as:  NORVASC Take 1 tablet (10 mg total) by mouth daily.   atorvastatin 40 MG tablet Commonly known as:  LIPITOR Take 40 mg by mouth daily.   carvedilol 25 MG tablet Commonly known as:  COREG Take 1 tablet (25 mg total) by mouth 2 (two) times daily with a meal. What changed:    medication strength  how much to take     diphenhydrAMINE 25 mg capsule Commonly known as:  BENADRYL Take 1 capsule (25 mg total) by mouth every 6 (six) hours as needed for itching.   furosemide 40 MG tablet Commonly known as:  LASIX Take 1 tablet (40 mg total) by mouth every evening.   furosemide 80 MG tablet Commonly known as:  LASIX Take 1 tablet (80 mg total) by mouth every morning.   gabapentin 100 MG capsule Commonly known as:  NEURONTIN Take 100 mg by mouth 3 (three) times daily.   hydrALAZINE 25 MG tablet Commonly known as:  APRESOLINE Take 1 tablet (25 mg total) by mouth every 8 (eight) hours.   HYDROcodone-acetaminophen 5-325 MG tablet Commonly known as:  NORCO/VICODIN Take 1 tablet by mouth every 6 (six) hours as needed for moderate pain.   insulin NPH Human 100 UNIT/ML injection Commonly known as:  HUMULIN N,NOVOLIN N Inject 40 Units into the skin 2 (two) times daily before a meal.   nitroGLYCERIN 0.4 MG SL tablet Commonly known as:  NITROSTAT Place 1  tablet (0.4 mg total) under the tongue every 5 (five) minutes as needed for chest pain.   potassium chloride SA 20 MEQ tablet Commonly known as:  K-DUR,KLOR-CON Take 1 tablet (20 mEq total) by mouth daily.   valACYclovir 1000 MG tablet Commonly known as:  VALTREX Take 1 tablet (1,000 mg total) by mouth 2 (two) times daily for 7 days.      No Known Allergies Follow-up Information    Shirline Frees, MD Follow up today.   Specialty:  Family Medicine Why:  hospital discharge follow up, repeat cbc/bmp at follow up.  please discuss with your pcp regarding singles vaccine.  Contact information: [place] [address] [phone]        Lorretta Harp, MD Follow up in 2 week(s).   Specialties:  Cardiology, Radiology Contact information: [address] [place] [address] [phone]            The results of significant diagnostics from this hospitalization (including imaging,  microbiology, ancillary and laboratory) are listed below for reference.    Significant Diagnostic Studies: Dg Chest 2 View  Result Date: 03/07/2018 CLINICAL DATA:  Left-sided chest pain for several hours EXAM: CHEST - 2 VIEW COMPARISON:  06/09/2017 FINDINGS: The heart size and mediastinal contours are within normal limits. Both lungs are clear. The visualized skeletal structures are unremarkable. IMPRESSION: No active cardiopulmonary disease. Electronically Signed   By: Inez Catalina M.D.   On: 03/07/2018 16:32   Nm Myocar Multi W/spect W/wall Motion / Ef  Result Date: 03/08/2018 CLINICAL DATA:  Chest pain. EXAM: MYOCARDIAL IMAGING WITH SPECT (REST AND PHARMACOLOGIC-STRESS) GATED LEFT VENTRICULAR WALL MOTION STUDY LEFT VENTRICULAR EJECTION FRACTION TECHNIQUE: Standard myocardial SPECT imaging was performed after resting intravenous injection of 10 mCi Tc-74m tetrofosmin. Subsequently, intravenous infusion of Lexiscan was performed under the supervision of the Cardiology staff. At peak effect of the drug, 30 mCi Tc-27m tetrofosmin was injected intravenously and standard myocardial SPECT imaging was performed. Quantitative gated imaging was also performed to evaluate left ventricular wall motion, and estimate left ventricular ejection fraction. COMPARISON:  None. FINDINGS: Perfusion: There is a moderate sized region of decreased uptake in the inferior/inferolateral wall on stress images only. No other focal perfusion abnormalities identified. Wall Motion: Normal left ventricular wall motion. No left ventricular dilation. Left Ventricular Ejection Fraction: 51 % End diastolic volume 591 ml End systolic volume 69 ml IMPRESSION: 1. There is a moderate sized reversible defect in the inferior/inferolateral wall suggestive of ischemia. 2. Normal left ventricular wall motion. 3. Left ventricular ejection fraction 51% 4. Non invasive risk stratification*: Intermediate *2012 Appropriate Use Criteria for Coronary  Revascularization Focused Update: J Am Coll Cardiol. 6384;66(5):993-570. http://[email or web address] Electronically Signed   By: Dorise Bullion III M.D   On: 03/08/2018 13:02    Microbiology: No results found for this or any previous visit (from the past 240 hour(s)).   Labs: Basic Metabolic Panel: Recent Labs  Lab 03/07/18 1606 03/09/18 0403 03/10/18 0455  NA 138 137 138  K 4.3 4.2 4.2  CL 101 105 104  CO2 27 27 25   GLUCOSE 313* 290* 222*  BUN 51* 50* 45*  CREATININE 2.17* 2.40* 2.07*  CALCIUM 9.1 8.2* 8.5*   Liver Function Tests: Recent Labs  Lab 03/07/18 2010 03/09/18 0403  AST 20 18  ALT 24 21  ALKPHOS 93 77  BILITOT 0.6 0.5  PROT 7.5 6.7  ALBUMIN 3.5 3.0*   No results for input(s): LIPASE,  AMYLASE in the last 168 hours. No results for input(s): AMMONIA in the last 168 hours. CBC: Recent Labs  Lab 03/07/18 1606 03/09/18 0403 03/10/18 0455  WBC 5.1 4.5 4.7  HGB 11.1* 10.1* 9.7*  HCT 34.8* 31.6* 30.7*  MCV 82.7 83.8 85.0  PLT 270 232 224   Cardiac Enzymes: Recent Labs  Lab 03/07/18 2010 03/07/18 2230 03/08/18 0055  TROPONINI <0.03 <0.03 <0.03   BNP: BNP (last 3 results) Recent Labs    06/07/17 1317  BNP 141.0*    ProBNP (last 3 results) No results for input(s): PROBNP in the last 8760 hours.  CBG: Recent Labs  Lab 03/09/18 1617 03/09/18 2033 03/09/18 2324 03/10/18 0337 03/10/18 0730  GLUCAP 135* 220* 250* 222* 195*       Signed:  Florencia Reasons MD, PhD  [facility] 03/10/2018, 10:31 AM

## 2018-03-10 NOTE — Progress Notes (Addendum)
Progress Note  Patient Name: Latasha [name] Date of Encounter: 03/10/2018  Primary Cardiologist: Dr. Quay Burow, MD  Subjective   Pt continues to have c/o of sharp, stabbing CP. Now with dermatomal rash consistent with shingles.   Inpatient Medications    Scheduled Meds: . amLODipine  10 mg Oral Daily  . aspirin EC  81 mg Oral Daily  . atorvastatin  40 mg Oral Daily  . carvedilol  25 mg Oral BID WC  . gabapentin  100 mg Oral TID  . insulin aspart  0-20 Units Subcutaneous TID WC  . insulin aspart  0-5 Units Subcutaneous QHS  . insulin aspart  6 Units Subcutaneous TID WC  . insulin NPH Human  20 Units Subcutaneous BID AC & HS  . sodium chloride flush  3 mL Intravenous Q12H   Continuous Infusions: . sodium chloride     PRN Meds: sodium chloride, acetaminophen, albuterol, alum & mag hydroxide-simeth, clonazepam, diphenhydrAMINE, fentaNYL (SUBLIMAZE) injection, HYDROcodone-acetaminophen, nitroGLYCERIN, ondansetron (ZOFRAN) IV, ondansetron (ZOFRAN) IV, sodium chloride flush   Vital Signs    Vitals:   03/09/18 1758 03/09/18 2036 03/10/18 0340 03/10/18 0547  BP: (!) 142/72 (!) 148/69 (!) 164/72   Pulse: 71 71 76   Resp:  16 16   Temp:  98.8 F (37.1 C) 98.3 F (36.8 C) 97.7 F (36.5 C)  TempSrc:  Oral Oral Oral  SpO2:  94% 94%   Weight:   243 lb 1.6 oz (110.3 kg)   Height:        Intake/Output Summary (Last 24 hours) at 03/10/2018 0927 Last data filed at 03/10/2018 0423 Gross per 24 hour  Intake 483 ml  Output -  Net 483 ml   Filed Weights   03/08/18 0444 03/09/18 0459 03/10/18 0340  Weight: 238 lb 6.4 oz (108.1 kg) 240 lb 14.4 oz (109.3 kg) 243 lb 1.6 oz (110.3 kg)    Physical Exam   General: Well developed, well nourished, NAD Skin: Warm, dry, intact.  Dermatomal rash, mid back to under breast left side Head: Normocephalic, atraumatic, clear, moist mucus membranes. Neck: Negative for carotid bruits. No JVD Lungs:Clear to ausculation bilaterally. No  wheezes, rales, or rhonchi. Breathing is unlabored. Cardiovascular: RRR with S1 S2. No murmurs, rubs or gallops Abdomen: Soft, non-tender, non-distended with normoactive bowel sounds.  No obvious abdominal masses. MSK: Strength and tone appear normal for age. 5/5 in all extremities Extremities: No edema. No clubbing or cyanosis. DP/PT pulses 2+ bilaterally Neuro: Alert and oriented. No focal deficits. No facial asymmetry. MAE spontaneously. Psych: Responds to questions appropriately with normal affect.    Labs    Chemistry Recent Labs  Lab 03/07/18 1606 03/07/18 2010 03/09/18 0403 03/10/18 0455  NA 138  --  137 138  K 4.3  --  4.2 4.2  CL 101  --  105 104  CO2 27  --  27 25  GLUCOSE 313*  --  290* 222*  BUN 51*  --  50* 45*  CREATININE 2.17*  --  2.40* 2.07*  CALCIUM 9.1  --  8.2* 8.5*  PROT  --  7.5 6.7  --   ALBUMIN  --  3.5 3.0*  --   AST  --  20 18  --   ALT  --  24 21  --   ALKPHOS  --  93 77  --   BILITOT  --  0.6 0.5  --   GFRNONAA 23*  --  21* 25*  GFRAA 27*  --  24* 29*  ANIONGAP 10  --  5 9     Hematology Recent Labs  Lab 03/07/18 1606 03/09/18 0403 03/10/18 0455  WBC 5.1 4.5 4.7  RBC 4.21 3.77* 3.61*  HGB 11.1* 10.1* 9.7*  HCT 34.8* 31.6* 30.7*  MCV 82.7 83.8 85.0  MCH 26.4 26.8 26.9  MCHC 31.9 32.0 31.6  RDW 13.5 13.5 13.5  PLT 270 232 224    Cardiac Enzymes Recent Labs  Lab 03/07/18 2010 03/07/18 2230 03/08/18 0055  TROPONINI <0.03 <0.03 <0.03    Recent Labs  Lab 03/07/18 1634 03/07/18 2021  TROPIPOC 0.01 0.01     BNPNo results for input(s): BNP, PROBNP in the last 168 hours.   DDimer No results for input(s): DDIMER in the last 168 hours.   Radiology    Nm Myocar Multi W/spect W/wall Motion / Ef  Result Date: 03/08/2018 CLINICAL DATA:  Chest pain. EXAM: MYOCARDIAL IMAGING WITH SPECT (REST AND PHARMACOLOGIC-STRESS) GATED LEFT VENTRICULAR WALL MOTION STUDY LEFT VENTRICULAR EJECTION FRACTION TECHNIQUE: Standard myocardial SPECT  imaging was performed after resting intravenous injection of 10 mCi Tc-28m tetrofosmin. Subsequently, intravenous infusion of Lexiscan was performed under the supervision of the Cardiology staff. At peak effect of the drug, 30 mCi Tc-12m tetrofosmin was injected intravenously and standard myocardial SPECT imaging was performed. Quantitative gated imaging was also performed to evaluate left ventricular wall motion, and estimate left ventricular ejection fraction. COMPARISON:  None. FINDINGS: Perfusion: There is a moderate sized region of decreased uptake in the inferior/inferolateral wall on stress images only. No other focal perfusion abnormalities identified. Wall Motion: Normal left ventricular wall motion. No left ventricular dilation. Left Ventricular Ejection Fraction: 51 % End diastolic volume 469 ml End systolic volume 69 ml IMPRESSION: 1. There is a moderate sized reversible defect in the inferior/inferolateral wall suggestive of ischemia. 2. Normal left ventricular wall motion. 3. Left ventricular ejection fraction 51% 4. Non invasive risk stratification*: Intermediate *2012 Appropriate Use Criteria for Coronary Revascularization Focused Update: J Am Coll Cardiol. 6295;28(4):132-440. http://content.airportbarriers.com.aspx?articleid=[email or web address] Electronically Signed   By: Dorise Bullion III M.D   On: 03/08/2018 13:02   Telemetry    NSR HR 86- Personally Reviewed  ECG    No new tracing as of 03/10/2018- Personally Reviewed  Cardiac Studies   Lexiscan Myoview 03/08/18 1. There is a moderate sized reversible defect in the inferior/inferolateral wall suggestive of ischemia. 2. Normal left ventricular wall motion. 3. Left ventricular ejection fraction 51% 4. Non invasive risk stratification*: Intermediate  Cardiac catheterization 03/09/2018:  IMPRESSION: Latasha [name] has normal coronary arteries and mildly elevated LVEDP.  Her 2D echo was normal.  I believe her chest pain was noncardiac and her  Myoview was false positive.  I performed a right common femoral angiogram and deployed a MYNX closure device achieving hemostasis.  Patient will be treated for noncardiac chest pain, gently heart hydrated and discharged home tomorrow.  Patient Profile     62 y.o. female with HTN. HFpEF, DM with microvascular complications, and CKD stage IV who presented to the ED with typical and atypical chest pain of acute onset. Troponin negative x 3, and EKG with new TWI in leads I and AvL. NM stress test with moderate reversible defect in the inferior/inferolateral wall suggestive of ischemia.   Cardiac cath with normal coronaries.  Now with dermatomal rash suggestive of shingles.  Assessment & Plan    1.  Atypical chest pain: -Patient presented to [facility] ED on 03/07/2017 with  chest pain.  Patient underwent Lexiscan Myoview stress test 03/08/2018 which was considered intermediate with reversible defect in the inferior/inferolateral wall suggestive of ischemia.  Subsequently, cardiac cath scheduled for 03/09/2018 revealed normal coronary arteries with mildly elevated LVEDP.  Myoview consider false positive.  Patient will need to be treated for noncardiac chest pain, gentle hydration -Troponin,<0.03, <0.03 -Continue ASA and carvedilol 25 -Patient now with dermatomal rash consistent with acute shingles.  Likely cause of her shooting, stabbing chest pain given normal coronaries per cardiac cath  2.  HTN: -Elevated, 164/72, 140/69, 142/72 -Amlodipine 10, carvedilol 25 -Consider adding additional agent however, elevations could be secondary to persistent shingles pain  2.  DM2: -SSI for glucose control, per primary team  4.  HLD: -LDL, 53 -Continue current regimen  5.  CKD stage IV: -Creatinine, 2.07 today, down from 2.40 yesterday -Followed by nephrology, internal medicine  6.  Shingles: -Dermatomal rash on left side, mid back to under breast -Likely cause of her shooting, stabbing chest pain  considering normal coronaries per cardiac cath on 03/09/2018 -Internal medicine to treat prior to discharge, will likely need antiviral and pain management.  Consider gabapentin for pain control   Signed, Kathyrn Drown NP-C [facility] Pager: [phone] 03/10/2018, 9:27 AM     For questions or updates, please contact   Please consult [email or web address] for contact info under Cardiology/STEMI.   Attending Note:   The patient was seen and examined.  Agree with assessment and plan as noted above.  Changes made to the above note as needed.  Patient seen and independently examined with Kathyrn Drown, NP .   We discussed all aspects of the encounter. I agree with the assessment and plan as stated above.  1.  Chest discomfort: Patient did not have any significant coronary artery disease at time of cath.  Now has a rash that is consistent with shingles.  It is quite likely that shingles are the examination for her chest discomfort. It appears that she tolerated the  cath quite well.  Her creatinine is actually improved from yesterday down to 2.07.  2.  Hypertension: Blood pressure remains mildly elevated.  Hydralazine 25 mg 3 times a day.  Further management per internal medicine team.  No further cardiac issues. Will sign off.  Please call for questions     I have spent a total of 40 minutes with patient reviewing hospital  notes , telemetry, EKGs, labs and examining patient as well as establishing an assessment and plan that was discussed with the patient. > 50% of time was spent in direct patient care.    Thayer Headings, Brooke Bonito., MD, [facility] 03/10/2018, 10:14 AM [address]  [place] Pager [phone]

## 2018-03-11 MED FILL — Nitroglycerin IV Soln 100 MCG/ML in D5W: INTRA_ARTERIAL | Qty: 10 | Status: AC

## 2018-03-11 MED FILL — Heparin Sod (Porcine)-NaCl IV Soln 1000 Unit/500ML-0.9%: INTRAVENOUS | Qty: 1000 | Status: AC

## 2018-03-11 MED FILL — Verapamil HCl IV Soln 2.5 MG/ML: INTRAVENOUS | Qty: 2 | Status: AC

## 2018-03-18 DIAGNOSIS — N184 Chronic kidney disease, stage 4 (severe): Secondary | ICD-10-CM | POA: Diagnosis not present

## 2018-03-18 DIAGNOSIS — B0229 Other postherpetic nervous system involvement: Secondary | ICD-10-CM | POA: Diagnosis not present

## 2018-03-18 DIAGNOSIS — R0789 Other chest pain: Secondary | ICD-10-CM | POA: Diagnosis not present

## 2018-07-17 DIAGNOSIS — D649 Anemia, unspecified: Secondary | ICD-10-CM | POA: Diagnosis not present

## 2018-07-17 DIAGNOSIS — N184 Chronic kidney disease, stage 4 (severe): Secondary | ICD-10-CM | POA: Diagnosis not present

## 2018-07-17 DIAGNOSIS — E1129 Type 2 diabetes mellitus with other diabetic kidney complication: Secondary | ICD-10-CM | POA: Diagnosis not present

## 2018-07-17 DIAGNOSIS — I129 Hypertensive chronic kidney disease with stage 1 through stage 4 chronic kidney disease, or unspecified chronic kidney disease: Secondary | ICD-10-CM | POA: Diagnosis not present

## 2018-07-17 DIAGNOSIS — R6889 Other general symptoms and signs: Secondary | ICD-10-CM | POA: Diagnosis not present

## 2018-08-06 DIAGNOSIS — Z794 Long term (current) use of insulin: Secondary | ICD-10-CM | POA: Diagnosis not present

## 2018-08-06 DIAGNOSIS — E1122 Type 2 diabetes mellitus with diabetic chronic kidney disease: Secondary | ICD-10-CM | POA: Diagnosis not present

## 2018-08-06 DIAGNOSIS — E1165 Type 2 diabetes mellitus with hyperglycemia: Secondary | ICD-10-CM | POA: Diagnosis not present

## 2018-08-06 DIAGNOSIS — E1169 Type 2 diabetes mellitus with other specified complication: Secondary | ICD-10-CM | POA: Diagnosis not present

## 2018-08-06 DIAGNOSIS — E1149 Type 2 diabetes mellitus with other diabetic neurological complication: Secondary | ICD-10-CM | POA: Diagnosis not present

## 2018-08-28 ENCOUNTER — Other Ambulatory Visit: Payer: Self-pay | Admitting: Family Medicine

## 2018-08-28 DIAGNOSIS — Z1211 Encounter for screening for malignant neoplasm of colon: Secondary | ICD-10-CM | POA: Diagnosis not present

## 2018-08-28 DIAGNOSIS — B0229 Other postherpetic nervous system involvement: Secondary | ICD-10-CM | POA: Diagnosis not present

## 2018-08-28 DIAGNOSIS — E7800 Pure hypercholesterolemia, unspecified: Secondary | ICD-10-CM | POA: Diagnosis not present

## 2018-08-28 DIAGNOSIS — Z1231 Encounter for screening mammogram for malignant neoplasm of breast: Secondary | ICD-10-CM

## 2018-08-28 DIAGNOSIS — I10 Essential (primary) hypertension: Secondary | ICD-10-CM | POA: Diagnosis not present

## 2018-08-28 DIAGNOSIS — E1165 Type 2 diabetes mellitus with hyperglycemia: Secondary | ICD-10-CM | POA: Diagnosis not present

## 2018-10-09 DIAGNOSIS — H4312 Vitreous hemorrhage, left eye: Secondary | ICD-10-CM | POA: Diagnosis not present

## 2018-10-09 DIAGNOSIS — E113513 Type 2 diabetes mellitus with proliferative diabetic retinopathy with macular edema, bilateral: Secondary | ICD-10-CM | POA: Diagnosis not present

## 2018-10-09 DIAGNOSIS — H3582 Retinal ischemia: Secondary | ICD-10-CM | POA: Diagnosis not present

## 2018-10-09 DIAGNOSIS — H35372 Puckering of macula, left eye: Secondary | ICD-10-CM | POA: Diagnosis not present

## 2018-10-23 DIAGNOSIS — E1129 Type 2 diabetes mellitus with other diabetic kidney complication: Secondary | ICD-10-CM | POA: Diagnosis not present

## 2018-10-23 DIAGNOSIS — I129 Hypertensive chronic kidney disease with stage 1 through stage 4 chronic kidney disease, or unspecified chronic kidney disease: Secondary | ICD-10-CM | POA: Diagnosis not present

## 2018-10-23 DIAGNOSIS — N184 Chronic kidney disease, stage 4 (severe): Secondary | ICD-10-CM | POA: Diagnosis not present

## 2018-10-23 DIAGNOSIS — D649 Anemia, unspecified: Secondary | ICD-10-CM | POA: Diagnosis not present

## 2018-11-05 DIAGNOSIS — E785 Hyperlipidemia, unspecified: Secondary | ICD-10-CM | POA: Diagnosis not present

## 2018-11-05 DIAGNOSIS — E1122 Type 2 diabetes mellitus with diabetic chronic kidney disease: Secondary | ICD-10-CM | POA: Diagnosis not present

## 2018-11-05 DIAGNOSIS — Z794 Long term (current) use of insulin: Secondary | ICD-10-CM | POA: Diagnosis not present

## 2018-11-05 DIAGNOSIS — E1165 Type 2 diabetes mellitus with hyperglycemia: Secondary | ICD-10-CM | POA: Diagnosis not present

## 2018-11-05 DIAGNOSIS — E1169 Type 2 diabetes mellitus with other specified complication: Secondary | ICD-10-CM | POA: Diagnosis not present

## 2018-11-05 DIAGNOSIS — E11319 Type 2 diabetes mellitus with unspecified diabetic retinopathy without macular edema: Secondary | ICD-10-CM | POA: Diagnosis not present

## 2018-11-05 DIAGNOSIS — E1149 Type 2 diabetes mellitus with other diabetic neurological complication: Secondary | ICD-10-CM | POA: Diagnosis not present

## 2018-11-09 DIAGNOSIS — N184 Chronic kidney disease, stage 4 (severe): Secondary | ICD-10-CM | POA: Diagnosis not present

## 2018-11-20 ENCOUNTER — Encounter: Payer: Self-pay | Admitting: Emergency Medicine

## 2018-11-20 ENCOUNTER — Ambulatory Visit
Admission: EM | Admit: 2018-11-20 | Discharge: 2018-11-20 | Disposition: A | Discharge status: Home / Self Care | Payer: BLUE CROSS/BLUE SHIELD

## 2018-11-20 DIAGNOSIS — M25512 Pain in left shoulder: Secondary | ICD-10-CM | POA: Diagnosis not present

## 2018-11-20 DIAGNOSIS — R0789 Other chest pain: Secondary | ICD-10-CM

## 2018-11-20 MED ORDER — MELOXICAM 7.5 MG PO TABS: 7.5000 mg | ORAL_TABLET | Freq: Every day | ORAL | 0 refills | Status: DC

## 2018-11-20 NOTE — ED Triage Notes (Signed)
Pt presents to [facility] for assessment of left shoulder pain x 2 months with radiation down to her fingers.  States early this morning she began having chest pain with it and numbness in her left finger tips.   Denies n/v, c/o reflux, denies light-headedness or dizziness, denies diaphoresis, denies neck or jaw pain.

## 2018-11-20 NOTE — ED Provider Notes (Signed)
[facility]    CSN: [id number] Arrival date & time: 11/20/18  1059     History   Chief Complaint Chief Complaint  Patient presents with  . Shoulder Pain  . Chest Pain    HPI Latasha [name] is a 63 y.o. female.   63 year old female with history of CHF, CKD, DM, HLD, HTN comes in for left shoulder pain and left-sided chest pain.  Patient states she has had 37-month history of left shoulder pain.  She experienced left-sided chest pain earlier this morning.  She thinks is due to indigestion, she woke up this morning feeling hungry, ate a salad, and went straight back to bed.  She woke up with intermittent chest pain that is sharp, lasts for 5 to 10 seconds at a time, and moves around to different spots on her left chest.  At the time, she denies shortness of breath, nausea, vomiting, lightheadedness, dizziness, diaphoresis, neck/jaw pain.  States chest pain eventually resolved, and denies current chest pain.  She was admitted June 2019 for chest pain.  States had cath that showed normal vessels.  States prior to discharge, developed a rash, and was told shingles could be the cause of her chest pain.  She has not followed up with cardiology since.  She denies exertional chest pain, exertional fatigue.  States her DM is uncontrolled, but since changing medication has been improving.  Her A1c is now 9.4, down from 11.  Last CBG last night, 143.  She has a 49-month history of shoulder pain, states it started after lifting a dumbbell.  Pain initially was intermittent, and did not prevent range of motion.  States pain has been gradually worsening, and now having trouble lifting her arm above her head.  She has intermittent numbness tingling to her fingertips without decrease in grip strength.  She feels part of the numbness/tingling could be due to her sleeping on the left side of her body.  She has not taken anything for the symptoms.       Past Medical History:  Diagnosis Date  .  BPPV (benign paroxysmal positional vertigo)   . Chronic congestive heart failure with left ventricular diastolic dysfunction ([place])   . CKD (chronic kidney disease), stage III ([place]) 05/15/2017  . Diabetes mellitus   . Hyperlipidemia   . Hypertension   . Left atrial enlargement   . Obesity   . Palpitations     Patient Active Problem List   Diagnosis Date Noted  . Chest pain, rule out acute myocardial infarction 03/07/2018  . CKD (chronic kidney disease) stage 4, GFR 15-29 ml/min (H[facility]) 03/07/2018  . Hyperphosphatemia 06/09/2017  . Elevated ALT measurement 06/08/2017  . Hypertensive heart disease with acute on chronic diastolic congestive heart failure ([place])   . Bilateral leg edema   . Chronic diastolic CHF (congestive heart failure) ([place]) 06/07/2017  . Acute on chronic diastolic CHF (congestive heart failure) ([place]) 06/07/2017  . Acute respiratory failure with hypoxia ([place]) 06/07/2017  . ARF (acute renal failure) ([place])   . Encephalopathy acute 05/15/2017  . Hyperosmolar non-ketotic state in patient with type 2 diabetes mellitus ([place]) 05/15/2017  . Chronic pain 05/15/2017  . Acute kidney injury superimposed on chronic kidney disease ([place])   . Hyperlipemia 01/19/2014  . Palpitations 01/19/2014  . PVC's (premature ventricular contractions) 01/19/2014  . SCIATICA, RIGHT 12/12/2010  . GASTROESOPHAGEAL REFLUX DISEASE 02/09/2010  . LEG CRAMPS 02/09/2010  . Morbid obesity ([place]) 12/01/2009  . VITAMIN D DEFICIENCY 10/13/2009  .  CONSTIPATION 10/13/2009  . LIVER FUNCTION TESTS, ABNORMAL, HX OF 10/13/2009  . Essential hypertension 08/24/2009  . SHOULDER PAIN, RIGHT, CHRONIC 08/24/2009  . Insulin-requiring or dependent type II diabetes mellitus ([place]) 10/08/1991    Past Surgical History:  Procedure Laterality Date  . ABDOMINAL HYSTERECTOMY    . EYE SURGERY     cataract/left eye, right eye had bleeding in back  . KNEE SURGERY    . LEFT HEART CATH AND CORONARY ANGIOGRAPHY N/A 03/09/2018    Procedure: LEFT HEART CATH AND CORONARY ANGIOGRAPHY;  Surgeon: Lorretta Harp, MD;  Location: [facility];  Service: Cardiovascular;  Laterality: N/A;  . TRIGGER FINGER RELEASE      OB History   No obstetric history on file.      Home Medications    Prior to Admission medications   Medication Sig Start Date End Date Taking? Authorizing Provider  amLODipine (NORVASC) 10 MG tablet Take 1 tablet (10 mg total) by mouth daily. 05/18/17  Yes Eugenie Filler, MD  atorvastatin (LIPITOR) 40 MG tablet Take 40 mg by mouth daily.    Yes [provider]  carvedilol (COREG) 25 MG tablet Take 1 tablet (25 mg total) by mouth 2 (two) times daily with a meal. 03/10/18  Yes Florencia Reasons, MD  furosemide (LASIX) 80 MG tablet Take 1 tablet (80 mg total) by mouth every morning. 06/11/17  Yes Sheikh, Omair Latif, DO  insulin NPH Human (HUMULIN N,NOVOLIN N) 100 UNIT/ML injection Inject 40 Units into the skin 2 (two) times daily before a meal.   Yes [provider]  potassium chloride SA (K-DUR,KLOR-CON) 20 MEQ tablet Take 1 tablet (20 mEq total) by mouth daily. 06/11/17  Yes Sheikh, Omair Latif, DO  traMADol (ULTRAM) 50 MG tablet Take 50 mg by mouth every 6 (six) hours as needed.   Yes [provider]  albuterol (PROVENTIL HFA;VENTOLIN HFA) 108 (90 Base) MCG/ACT inhaler Inhale 2 puffs into the lungs every 6 (six) hours as needed for wheezing or shortness of breath.    [provider]  alum & mag hydroxide-simeth (MAALOX/MYLANTA) 200-200-20 MG/5ML suspension Take 30 mLs by mouth every 6 (six) hours as needed for indigestion or heartburn.    [provider]  diphenhydrAMINE (BENADRYL) 25 mg capsule Take 1 capsule (25 mg total) by mouth every 6 (six) hours as needed for itching. 03/10/18   Florencia Reasons, MD  furosemide (LASIX) 40 MG tablet Take 1 tablet (40 mg total) by mouth every evening. 06/10/17   Raiford Noble Latif, DO  gabapentin (NEURONTIN) 100 MG capsule Take 100 mg by  mouth 3 (three) times daily.     [provider]  hydrALAZINE (APRESOLINE) 25 MG tablet Take 1 tablet (25 mg total) by mouth every 8 (eight) hours. 03/10/18   Florencia Reasons, MD  HYDROcodone-acetaminophen (NORCO/VICODIN) 5-325 MG tablet Take 1 tablet by mouth every 6 (six) hours as needed for moderate pain. 03/10/18   Florencia Reasons, MD  meloxicam (MOBIC) 7.5 MG tablet Take 1 tablet (7.5 mg total) by mouth daily. 11/20/18   Tasia Catchings, Amy V, PA-C  nitroGLYCERIN (NITROSTAT) 0.4 MG SL tablet Place 1 tablet (0.4 mg total) under the tongue every 5 (five) minutes as needed for chest pain. 03/10/18   Florencia Reasons, MD    Family History Family History  Problem Relation Age of Onset  . Heart disease Father   . Hypertension Father   . Alzheimer's disease Mother   . Diabetes Mother   . Diabetes  Sister   . Dementia Other     Social History Social History   Tobacco Use  . Smoking status: Never Smoker  . Smokeless tobacco: Never Used  Substance Use Topics  . Alcohol use: Yes    Comment:  rare  . Drug use: No     Allergies   Patient has no known allergies.   Review of Systems Review of Systems  Reason unable to perform ROS: See HPI as above.     Physical Exam Triage Vital Signs ED Triage Vitals  Enc Vitals Group     BP 11/20/18 1106 (!) 179/84     Pulse Rate 11/20/18 1106 78     Resp 11/20/18 1106 18     Temp 11/20/18 1106 97.6 F (36.4 C)     Temp Source 11/20/18 1106 Oral     SpO2 11/20/18 1106 97 %     Weight --      Height --      Head Circumference --      Peak Flow --      Pain Score 11/20/18 1107 5     Pain Loc --      Pain Edu? --      Excl. in [place]? --    No data found.  Updated Vital Signs BP (!) 179/84 (BP Location: Left Arm)   Pulse 78   Temp 97.6 F (36.4 C) (Oral)   Resp 18   SpO2 97%     Physical Exam Constitutional:      General: She is not in acute distress.    Appearance: She is well-developed. She is not ill-appearing, toxic-appearing or diaphoretic.  HENT:      Head: Normocephalic and atraumatic.  Eyes:     Conjunctiva/sclera: Conjunctivae normal.     Pupils: Pupils are equal, round, and reactive to light.  Cardiovascular:     Rate and Rhythm: Normal rate and regular rhythm.     Heart sounds: No murmur. No friction rub. No gallop.   Pulmonary:     Effort: Pulmonary effort is normal. No tachypnea, accessory muscle usage or respiratory distress.     Breath sounds: Normal breath sounds. No stridor. No decreased breath sounds, wheezing, rhonchi or rales.  Chest:     Chest wall: No tenderness.  Abdominal:     General: Bowel sounds are normal.     Palpations: Abdomen is soft.     Tenderness: There is no abdominal tenderness. There is no guarding or rebound.  Musculoskeletal:     Comments: No tenderness to palpation of the spinous processes.  No tenderness to palpation of the neck.  Tenderness to palpation of left shoulder diffusely, extending to the left upper back.  Full range of motion of her neck.  Decreased abduction of the shoulder with active range of motion.  Full passive range of motion.  Strength normal and equal bilaterally.  Sensation intact and equal bilaterally.  Radial pulse 2+, cap refill less than 2 seconds.  Skin:    General: Skin is warm and dry.  Neurological:     Mental Status: She is alert and oriented to person, place, and time.      UC Treatments / Results  Labs (all labs ordered are listed, but only abnormal results are displayed) Labs Reviewed - No data to display  EKG None  Radiology No results found.  Procedures Procedures (including critical care time)  Medications Ordered in UC Medications - No data to display  Initial Impression /  Assessment and Plan / UC Course  I have reviewed the triage vital signs and the nursing notes.  Pertinent labs & imaging results that were available during my care of the patient were reviewed by me and considered in my medical decision making (see chart for details).      Patient EKG normal sinus rhythm, 76 bpm, nonspecific T wave abnormality that is unchanged from prior EKG.  Patient without current chest pain, low suspicion for ACS.  Will have patient continue to monitor chest pain.  Discussed possible tendinitis causing shoulder pain.  Will start Mobic as directed.  Shoulder exercises discussed.  Patient to continue monitoring.  Strict return precautions given.  Patient expresses understanding and agrees to plan.  Final Clinical Impressions(s) / UC Diagnoses   Final diagnoses:  Acute pain of left shoulder  Atypical chest pain    ED Prescriptions    Medication Sig Dispense Auth. Provider   meloxicam (MOBIC) 7.5 MG tablet Take 1 tablet (7.5 mg total) by mouth daily. 15 tablet Tobin Chad, [place] 11/20/18 1205

## 2018-11-20 NOTE — ED Notes (Signed)
Patient able to ambulate independently  

## 2018-11-20 NOTE — Discharge Instructions (Signed)
No alarming signs on exam. Your ekg was the same as before. Start Mobic. Do not take ibuprofen (motrin/advil)/ naproxen (aleve) while on mobic. Ice compress to the shoulder, warm compress to the back can help the symptoms. Follow up with your PCP in 1-2 weeks for further evaluation if symptoms still not improving. If experiencing worsening chest pain, shortness of breath, palpitations, weakness, dizziness, go to the emergency department for further evaluation needed.

## 2018-12-11 ENCOUNTER — Ambulatory Visit
Admission: RE | Admit: 2018-12-11 | Discharge: 2018-12-11 | Disposition: A | Discharge status: Home / Self Care | Payer: BLUE CROSS/BLUE SHIELD | Source: Ambulatory Visit | Attending: Family Medicine | Admitting: Family Medicine

## 2018-12-11 DIAGNOSIS — Z1231 Encounter for screening mammogram for malignant neoplasm of breast: Secondary | ICD-10-CM | POA: Diagnosis not present

## 2019-03-05 DIAGNOSIS — I129 Hypertensive chronic kidney disease with stage 1 through stage 4 chronic kidney disease, or unspecified chronic kidney disease: Secondary | ICD-10-CM | POA: Diagnosis not present

## 2019-03-05 DIAGNOSIS — I5030 Unspecified diastolic (congestive) heart failure: Secondary | ICD-10-CM | POA: Diagnosis not present

## 2019-03-05 DIAGNOSIS — D649 Anemia, unspecified: Secondary | ICD-10-CM | POA: Diagnosis not present

## 2019-03-05 DIAGNOSIS — Z6841 Body Mass Index (BMI) 40.0 and over, adult: Secondary | ICD-10-CM | POA: Diagnosis not present

## 2019-03-05 DIAGNOSIS — R6889 Other general symptoms and signs: Secondary | ICD-10-CM | POA: Diagnosis not present

## 2019-03-05 DIAGNOSIS — N184 Chronic kidney disease, stage 4 (severe): Secondary | ICD-10-CM | POA: Diagnosis not present

## 2019-03-05 DIAGNOSIS — E1122 Type 2 diabetes mellitus with diabetic chronic kidney disease: Secondary | ICD-10-CM | POA: Diagnosis not present

## 2019-03-08 DIAGNOSIS — N184 Chronic kidney disease, stage 4 (severe): Secondary | ICD-10-CM | POA: Diagnosis not present

## 2019-03-17 DIAGNOSIS — I129 Hypertensive chronic kidney disease with stage 1 through stage 4 chronic kidney disease, or unspecified chronic kidney disease: Secondary | ICD-10-CM | POA: Diagnosis not present

## 2019-03-17 DIAGNOSIS — E785 Hyperlipidemia, unspecified: Secondary | ICD-10-CM | POA: Diagnosis not present

## 2019-03-17 DIAGNOSIS — Z794 Long term (current) use of insulin: Secondary | ICD-10-CM | POA: Diagnosis not present

## 2019-03-17 DIAGNOSIS — E1169 Type 2 diabetes mellitus with other specified complication: Secondary | ICD-10-CM | POA: Diagnosis not present

## 2019-03-17 DIAGNOSIS — E1122 Type 2 diabetes mellitus with diabetic chronic kidney disease: Secondary | ICD-10-CM | POA: Diagnosis not present

## 2019-03-17 DIAGNOSIS — E11319 Type 2 diabetes mellitus with unspecified diabetic retinopathy without macular edema: Secondary | ICD-10-CM | POA: Diagnosis not present

## 2019-03-17 DIAGNOSIS — E1165 Type 2 diabetes mellitus with hyperglycemia: Secondary | ICD-10-CM | POA: Diagnosis not present

## 2019-03-17 DIAGNOSIS — E1149 Type 2 diabetes mellitus with other diabetic neurological complication: Secondary | ICD-10-CM | POA: Diagnosis not present

## 2019-03-18 DIAGNOSIS — M7582 Other shoulder lesions, left shoulder: Secondary | ICD-10-CM | POA: Diagnosis not present

## 2019-03-18 DIAGNOSIS — M5412 Radiculopathy, cervical region: Secondary | ICD-10-CM | POA: Diagnosis not present

## 2019-05-21 DIAGNOSIS — F324 Major depressive disorder, single episode, in partial remission: Secondary | ICD-10-CM | POA: Diagnosis not present

## 2019-05-21 DIAGNOSIS — H8113 Benign paroxysmal vertigo, bilateral: Secondary | ICD-10-CM | POA: Diagnosis not present

## 2019-05-21 DIAGNOSIS — E1140 Type 2 diabetes mellitus with diabetic neuropathy, unspecified: Secondary | ICD-10-CM | POA: Diagnosis not present

## 2019-05-21 DIAGNOSIS — E1165 Type 2 diabetes mellitus with hyperglycemia: Secondary | ICD-10-CM | POA: Diagnosis not present

## 2019-05-21 DIAGNOSIS — I10 Essential (primary) hypertension: Secondary | ICD-10-CM | POA: Diagnosis not present

## 2019-05-21 DIAGNOSIS — E7800 Pure hypercholesterolemia, unspecified: Secondary | ICD-10-CM | POA: Diagnosis not present

## 2019-05-21 DIAGNOSIS — R600 Localized edema: Secondary | ICD-10-CM | POA: Diagnosis not present

## 2019-05-21 DIAGNOSIS — I5030 Unspecified diastolic (congestive) heart failure: Secondary | ICD-10-CM | POA: Diagnosis not present

## 2019-05-21 DIAGNOSIS — E11319 Type 2 diabetes mellitus with unspecified diabetic retinopathy without macular edema: Secondary | ICD-10-CM | POA: Diagnosis not present

## 2019-06-16 DIAGNOSIS — N184 Chronic kidney disease, stage 4 (severe): Secondary | ICD-10-CM | POA: Diagnosis not present

## 2019-06-16 DIAGNOSIS — Z6841 Body Mass Index (BMI) 40.0 and over, adult: Secondary | ICD-10-CM | POA: Diagnosis not present

## 2019-06-16 DIAGNOSIS — E1165 Type 2 diabetes mellitus with hyperglycemia: Secondary | ICD-10-CM | POA: Diagnosis not present

## 2019-06-16 DIAGNOSIS — F5089 Other specified eating disorder: Secondary | ICD-10-CM | POA: Diagnosis not present

## 2019-06-16 DIAGNOSIS — E1149 Type 2 diabetes mellitus with other diabetic neurological complication: Secondary | ICD-10-CM | POA: Diagnosis not present

## 2019-06-16 DIAGNOSIS — Z794 Long term (current) use of insulin: Secondary | ICD-10-CM | POA: Diagnosis not present

## 2019-06-18 DIAGNOSIS — D649 Anemia, unspecified: Secondary | ICD-10-CM | POA: Diagnosis not present

## 2019-06-18 DIAGNOSIS — Z6841 Body Mass Index (BMI) 40.0 and over, adult: Secondary | ICD-10-CM | POA: Diagnosis not present

## 2019-06-18 DIAGNOSIS — E1122 Type 2 diabetes mellitus with diabetic chronic kidney disease: Secondary | ICD-10-CM | POA: Diagnosis not present

## 2019-06-18 DIAGNOSIS — N184 Chronic kidney disease, stage 4 (severe): Secondary | ICD-10-CM | POA: Diagnosis not present

## 2019-06-18 DIAGNOSIS — I5030 Unspecified diastolic (congestive) heart failure: Secondary | ICD-10-CM | POA: Diagnosis not present

## 2019-06-18 DIAGNOSIS — I129 Hypertensive chronic kidney disease with stage 1 through stage 4 chronic kidney disease, or unspecified chronic kidney disease: Secondary | ICD-10-CM | POA: Diagnosis not present

## 2019-06-23 DIAGNOSIS — M546 Pain in thoracic spine: Secondary | ICD-10-CM | POA: Diagnosis not present

## 2019-06-25 ENCOUNTER — Other Ambulatory Visit: Payer: Self-pay | Admitting: Family Medicine

## 2019-06-25 ENCOUNTER — Ambulatory Visit
Admission: RE | Admit: 2019-06-25 | Discharge: 2019-06-25 | Disposition: A | Discharge status: Home / Self Care | Payer: Medicare HMO | Source: Ambulatory Visit | Attending: Family Medicine | Admitting: Family Medicine

## 2019-06-25 ENCOUNTER — Other Ambulatory Visit: Payer: Self-pay

## 2019-06-25 DIAGNOSIS — M546 Pain in thoracic spine: Secondary | ICD-10-CM

## 2019-06-25 DIAGNOSIS — M542 Cervicalgia: Secondary | ICD-10-CM

## 2019-07-14 DIAGNOSIS — Z7189 Other specified counseling: Secondary | ICD-10-CM | POA: Diagnosis not present

## 2019-08-13 DIAGNOSIS — H3582 Retinal ischemia: Secondary | ICD-10-CM | POA: Diagnosis not present

## 2019-08-13 DIAGNOSIS — H4313 Vitreous hemorrhage, bilateral: Secondary | ICD-10-CM | POA: Diagnosis not present

## 2019-08-13 DIAGNOSIS — E113513 Type 2 diabetes mellitus with proliferative diabetic retinopathy with macular edema, bilateral: Secondary | ICD-10-CM | POA: Diagnosis not present

## 2019-08-16 DIAGNOSIS — E1165 Type 2 diabetes mellitus with hyperglycemia: Secondary | ICD-10-CM | POA: Diagnosis not present

## 2019-08-16 DIAGNOSIS — E1122 Type 2 diabetes mellitus with diabetic chronic kidney disease: Secondary | ICD-10-CM | POA: Diagnosis not present

## 2019-08-16 DIAGNOSIS — Z6841 Body Mass Index (BMI) 40.0 and over, adult: Secondary | ICD-10-CM | POA: Diagnosis not present

## 2019-08-16 DIAGNOSIS — E1149 Type 2 diabetes mellitus with other diabetic neurological complication: Secondary | ICD-10-CM | POA: Diagnosis not present

## 2019-08-16 DIAGNOSIS — I129 Hypertensive chronic kidney disease with stage 1 through stage 4 chronic kidney disease, or unspecified chronic kidney disease: Secondary | ICD-10-CM | POA: Diagnosis not present

## 2019-08-16 DIAGNOSIS — N184 Chronic kidney disease, stage 4 (severe): Secondary | ICD-10-CM | POA: Diagnosis not present

## 2019-08-16 DIAGNOSIS — Z794 Long term (current) use of insulin: Secondary | ICD-10-CM | POA: Diagnosis not present

## 2019-08-20 DIAGNOSIS — J40 Bronchitis, not specified as acute or chronic: Secondary | ICD-10-CM | POA: Diagnosis not present

## 2019-08-20 DIAGNOSIS — Z23 Encounter for immunization: Secondary | ICD-10-CM | POA: Diagnosis not present

## 2019-08-20 DIAGNOSIS — F419 Anxiety disorder, unspecified: Secondary | ICD-10-CM | POA: Diagnosis not present

## 2019-08-20 DIAGNOSIS — N184 Chronic kidney disease, stage 4 (severe): Secondary | ICD-10-CM | POA: Diagnosis not present

## 2019-08-20 DIAGNOSIS — H8113 Benign paroxysmal vertigo, bilateral: Secondary | ICD-10-CM | POA: Diagnosis not present

## 2019-08-20 DIAGNOSIS — E7800 Pure hypercholesterolemia, unspecified: Secondary | ICD-10-CM | POA: Diagnosis not present

## 2019-08-20 DIAGNOSIS — I5030 Unspecified diastolic (congestive) heart failure: Secondary | ICD-10-CM | POA: Diagnosis not present

## 2019-08-20 DIAGNOSIS — E1165 Type 2 diabetes mellitus with hyperglycemia: Secondary | ICD-10-CM | POA: Diagnosis not present

## 2019-08-20 DIAGNOSIS — I10 Essential (primary) hypertension: Secondary | ICD-10-CM | POA: Diagnosis not present

## 2019-08-20 DIAGNOSIS — M545 Low back pain: Secondary | ICD-10-CM | POA: Diagnosis not present

## 2019-09-13 DIAGNOSIS — Z6841 Body Mass Index (BMI) 40.0 and over, adult: Secondary | ICD-10-CM | POA: Diagnosis not present

## 2019-09-13 DIAGNOSIS — Z794 Long term (current) use of insulin: Secondary | ICD-10-CM | POA: Diagnosis not present

## 2019-09-13 DIAGNOSIS — E1149 Type 2 diabetes mellitus with other diabetic neurological complication: Secondary | ICD-10-CM | POA: Diagnosis not present

## 2019-09-13 DIAGNOSIS — Z713 Dietary counseling and surveillance: Secondary | ICD-10-CM | POA: Diagnosis not present

## 2019-09-13 DIAGNOSIS — E1165 Type 2 diabetes mellitus with hyperglycemia: Secondary | ICD-10-CM | POA: Diagnosis not present

## 2019-10-29 DIAGNOSIS — N390 Urinary tract infection, site not specified: Secondary | ICD-10-CM | POA: Diagnosis not present

## 2019-10-29 DIAGNOSIS — E1122 Type 2 diabetes mellitus with diabetic chronic kidney disease: Secondary | ICD-10-CM | POA: Diagnosis not present

## 2019-10-29 DIAGNOSIS — N184 Chronic kidney disease, stage 4 (severe): Secondary | ICD-10-CM | POA: Diagnosis not present

## 2019-10-29 DIAGNOSIS — Z6841 Body Mass Index (BMI) 40.0 and over, adult: Secondary | ICD-10-CM | POA: Diagnosis not present

## 2019-10-29 DIAGNOSIS — R109 Unspecified abdominal pain: Secondary | ICD-10-CM | POA: Diagnosis not present

## 2019-10-29 DIAGNOSIS — I129 Hypertensive chronic kidney disease with stage 1 through stage 4 chronic kidney disease, or unspecified chronic kidney disease: Secondary | ICD-10-CM | POA: Diagnosis not present

## 2019-10-29 DIAGNOSIS — I5030 Unspecified diastolic (congestive) heart failure: Secondary | ICD-10-CM | POA: Diagnosis not present

## 2019-10-29 DIAGNOSIS — D649 Anemia, unspecified: Secondary | ICD-10-CM | POA: Diagnosis not present

## 2019-11-22 DIAGNOSIS — E7800 Pure hypercholesterolemia, unspecified: Secondary | ICD-10-CM | POA: Diagnosis not present

## 2019-11-22 DIAGNOSIS — N184 Chronic kidney disease, stage 4 (severe): Secondary | ICD-10-CM | POA: Diagnosis not present

## 2019-11-22 DIAGNOSIS — E1165 Type 2 diabetes mellitus with hyperglycemia: Secondary | ICD-10-CM | POA: Diagnosis not present

## 2019-11-22 DIAGNOSIS — R252 Cramp and spasm: Secondary | ICD-10-CM | POA: Diagnosis not present

## 2019-11-22 DIAGNOSIS — E1140 Type 2 diabetes mellitus with diabetic neuropathy, unspecified: Secondary | ICD-10-CM | POA: Diagnosis not present

## 2019-11-22 DIAGNOSIS — E11319 Type 2 diabetes mellitus with unspecified diabetic retinopathy without macular edema: Secondary | ICD-10-CM | POA: Diagnosis not present

## 2019-11-22 DIAGNOSIS — R600 Localized edema: Secondary | ICD-10-CM | POA: Diagnosis not present

## 2019-11-22 DIAGNOSIS — I5030 Unspecified diastolic (congestive) heart failure: Secondary | ICD-10-CM | POA: Diagnosis not present

## 2019-11-22 DIAGNOSIS — I10 Essential (primary) hypertension: Secondary | ICD-10-CM | POA: Diagnosis not present

## 2019-11-29 DIAGNOSIS — E1122 Type 2 diabetes mellitus with diabetic chronic kidney disease: Secondary | ICD-10-CM | POA: Diagnosis not present

## 2019-11-29 DIAGNOSIS — E1165 Type 2 diabetes mellitus with hyperglycemia: Secondary | ICD-10-CM | POA: Diagnosis not present

## 2019-11-29 DIAGNOSIS — Z794 Long term (current) use of insulin: Secondary | ICD-10-CM | POA: Diagnosis not present

## 2019-11-29 DIAGNOSIS — E1169 Type 2 diabetes mellitus with other specified complication: Secondary | ICD-10-CM | POA: Diagnosis not present

## 2019-11-29 DIAGNOSIS — N184 Chronic kidney disease, stage 4 (severe): Secondary | ICD-10-CM | POA: Diagnosis not present

## 2019-11-29 DIAGNOSIS — I129 Hypertensive chronic kidney disease with stage 1 through stage 4 chronic kidney disease, or unspecified chronic kidney disease: Secondary | ICD-10-CM | POA: Diagnosis not present

## 2019-11-29 DIAGNOSIS — R0683 Snoring: Secondary | ICD-10-CM | POA: Diagnosis not present

## 2019-11-29 DIAGNOSIS — E785 Hyperlipidemia, unspecified: Secondary | ICD-10-CM | POA: Diagnosis not present

## 2019-12-09 DIAGNOSIS — E1165 Type 2 diabetes mellitus with hyperglycemia: Secondary | ICD-10-CM | POA: Diagnosis not present

## 2019-12-09 DIAGNOSIS — E11319 Type 2 diabetes mellitus with unspecified diabetic retinopathy without macular edema: Secondary | ICD-10-CM | POA: Diagnosis not present

## 2019-12-09 DIAGNOSIS — E1169 Type 2 diabetes mellitus with other specified complication: Secondary | ICD-10-CM | POA: Diagnosis not present

## 2019-12-09 DIAGNOSIS — I129 Hypertensive chronic kidney disease with stage 1 through stage 4 chronic kidney disease, or unspecified chronic kidney disease: Secondary | ICD-10-CM | POA: Diagnosis not present

## 2019-12-09 DIAGNOSIS — E1149 Type 2 diabetes mellitus with other diabetic neurological complication: Secondary | ICD-10-CM | POA: Diagnosis not present

## 2019-12-09 DIAGNOSIS — Z794 Long term (current) use of insulin: Secondary | ICD-10-CM | POA: Diagnosis not present

## 2019-12-09 DIAGNOSIS — N184 Chronic kidney disease, stage 4 (severe): Secondary | ICD-10-CM | POA: Diagnosis not present

## 2019-12-09 DIAGNOSIS — E785 Hyperlipidemia, unspecified: Secondary | ICD-10-CM | POA: Diagnosis not present

## 2019-12-09 DIAGNOSIS — E1122 Type 2 diabetes mellitus with diabetic chronic kidney disease: Secondary | ICD-10-CM | POA: Diagnosis not present

## 2019-12-22 DIAGNOSIS — Z723 Lack of physical exercise: Secondary | ICD-10-CM | POA: Diagnosis not present

## 2019-12-22 DIAGNOSIS — R29898 Other symptoms and signs involving the musculoskeletal system: Secondary | ICD-10-CM | POA: Diagnosis not present

## 2019-12-22 DIAGNOSIS — Z6841 Body Mass Index (BMI) 40.0 and over, adult: Secondary | ICD-10-CM | POA: Diagnosis not present

## 2020-01-03 DIAGNOSIS — E1149 Type 2 diabetes mellitus with other diabetic neurological complication: Secondary | ICD-10-CM | POA: Diagnosis not present

## 2020-01-03 DIAGNOSIS — Z713 Dietary counseling and surveillance: Secondary | ICD-10-CM | POA: Diagnosis not present

## 2020-01-03 DIAGNOSIS — Z794 Long term (current) use of insulin: Secondary | ICD-10-CM | POA: Diagnosis not present

## 2020-01-03 DIAGNOSIS — E1165 Type 2 diabetes mellitus with hyperglycemia: Secondary | ICD-10-CM | POA: Diagnosis not present

## 2020-01-03 DIAGNOSIS — Z6841 Body Mass Index (BMI) 40.0 and over, adult: Secondary | ICD-10-CM | POA: Diagnosis not present

## 2020-01-11 DIAGNOSIS — E1165 Type 2 diabetes mellitus with hyperglycemia: Secondary | ICD-10-CM | POA: Diagnosis not present

## 2020-01-11 DIAGNOSIS — E785 Hyperlipidemia, unspecified: Secondary | ICD-10-CM | POA: Diagnosis not present

## 2020-01-11 DIAGNOSIS — Z794 Long term (current) use of insulin: Secondary | ICD-10-CM | POA: Diagnosis not present

## 2020-01-11 DIAGNOSIS — E1169 Type 2 diabetes mellitus with other specified complication: Secondary | ICD-10-CM | POA: Diagnosis not present

## 2020-01-11 DIAGNOSIS — I129 Hypertensive chronic kidney disease with stage 1 through stage 4 chronic kidney disease, or unspecified chronic kidney disease: Secondary | ICD-10-CM | POA: Diagnosis not present

## 2020-01-11 DIAGNOSIS — N184 Chronic kidney disease, stage 4 (severe): Secondary | ICD-10-CM | POA: Diagnosis not present

## 2020-01-11 DIAGNOSIS — E1122 Type 2 diabetes mellitus with diabetic chronic kidney disease: Secondary | ICD-10-CM | POA: Diagnosis not present

## 2020-01-11 DIAGNOSIS — E1149 Type 2 diabetes mellitus with other diabetic neurological complication: Secondary | ICD-10-CM | POA: Diagnosis not present

## 2020-01-17 DIAGNOSIS — Z7189 Other specified counseling: Secondary | ICD-10-CM | POA: Diagnosis not present

## 2020-02-01 DIAGNOSIS — H35372 Puckering of macula, left eye: Secondary | ICD-10-CM | POA: Diagnosis not present

## 2020-02-01 DIAGNOSIS — E113513 Type 2 diabetes mellitus with proliferative diabetic retinopathy with macular edema, bilateral: Secondary | ICD-10-CM | POA: Diagnosis not present

## 2020-02-01 DIAGNOSIS — H4311 Vitreous hemorrhage, right eye: Secondary | ICD-10-CM | POA: Diagnosis not present

## 2020-02-04 DIAGNOSIS — E7800 Pure hypercholesterolemia, unspecified: Secondary | ICD-10-CM | POA: Diagnosis not present

## 2020-02-04 DIAGNOSIS — J45909 Unspecified asthma, uncomplicated: Secondary | ICD-10-CM | POA: Diagnosis not present

## 2020-02-04 DIAGNOSIS — I5030 Unspecified diastolic (congestive) heart failure: Secondary | ICD-10-CM | POA: Diagnosis not present

## 2020-02-04 DIAGNOSIS — I10 Essential (primary) hypertension: Secondary | ICD-10-CM | POA: Diagnosis not present

## 2020-02-04 DIAGNOSIS — E1140 Type 2 diabetes mellitus with diabetic neuropathy, unspecified: Secondary | ICD-10-CM | POA: Diagnosis not present

## 2020-02-04 DIAGNOSIS — N184 Chronic kidney disease, stage 4 (severe): Secondary | ICD-10-CM | POA: Diagnosis not present

## 2020-02-04 DIAGNOSIS — E11319 Type 2 diabetes mellitus with unspecified diabetic retinopathy without macular edema: Secondary | ICD-10-CM | POA: Diagnosis not present

## 2020-02-04 DIAGNOSIS — H8113 Benign paroxysmal vertigo, bilateral: Secondary | ICD-10-CM | POA: Diagnosis not present

## 2020-02-07 DIAGNOSIS — H4311 Vitreous hemorrhage, right eye: Secondary | ICD-10-CM | POA: Diagnosis not present

## 2020-02-07 DIAGNOSIS — H26491 Other secondary cataract, right eye: Secondary | ICD-10-CM | POA: Diagnosis not present

## 2020-02-07 DIAGNOSIS — E113511 Type 2 diabetes mellitus with proliferative diabetic retinopathy with macular edema, right eye: Secondary | ICD-10-CM | POA: Diagnosis not present

## 2020-02-14 DIAGNOSIS — I129 Hypertensive chronic kidney disease with stage 1 through stage 4 chronic kidney disease, or unspecified chronic kidney disease: Secondary | ICD-10-CM | POA: Diagnosis not present

## 2020-02-14 DIAGNOSIS — E1165 Type 2 diabetes mellitus with hyperglycemia: Secondary | ICD-10-CM | POA: Diagnosis not present

## 2020-02-14 DIAGNOSIS — I1310 Hypertensive heart and chronic kidney disease without heart failure, with stage 1 through stage 4 chronic kidney disease, or unspecified chronic kidney disease: Secondary | ICD-10-CM | POA: Diagnosis not present

## 2020-02-14 DIAGNOSIS — E785 Hyperlipidemia, unspecified: Secondary | ICD-10-CM | POA: Diagnosis not present

## 2020-02-14 DIAGNOSIS — I493 Ventricular premature depolarization: Secondary | ICD-10-CM | POA: Diagnosis not present

## 2020-02-14 DIAGNOSIS — I110 Hypertensive heart disease with heart failure: Secondary | ICD-10-CM | POA: Diagnosis not present

## 2020-02-14 DIAGNOSIS — E1122 Type 2 diabetes mellitus with diabetic chronic kidney disease: Secondary | ICD-10-CM | POA: Diagnosis not present

## 2020-02-14 DIAGNOSIS — R5383 Other fatigue: Secondary | ICD-10-CM | POA: Diagnosis not present

## 2020-02-14 DIAGNOSIS — E1169 Type 2 diabetes mellitus with other specified complication: Secondary | ICD-10-CM | POA: Diagnosis not present

## 2020-02-14 DIAGNOSIS — Z6841 Body Mass Index (BMI) 40.0 and over, adult: Secondary | ICD-10-CM | POA: Diagnosis not present

## 2020-02-14 DIAGNOSIS — N184 Chronic kidney disease, stage 4 (severe): Secondary | ICD-10-CM | POA: Diagnosis not present

## 2020-02-14 DIAGNOSIS — I5033 Acute on chronic diastolic (congestive) heart failure: Secondary | ICD-10-CM | POA: Diagnosis not present

## 2020-02-14 DIAGNOSIS — R002 Palpitations: Secondary | ICD-10-CM | POA: Diagnosis not present

## 2020-02-14 DIAGNOSIS — Z794 Long term (current) use of insulin: Secondary | ICD-10-CM | POA: Diagnosis not present

## 2020-02-18 DIAGNOSIS — I129 Hypertensive chronic kidney disease with stage 1 through stage 4 chronic kidney disease, or unspecified chronic kidney disease: Secondary | ICD-10-CM | POA: Diagnosis not present

## 2020-02-18 DIAGNOSIS — E1122 Type 2 diabetes mellitus with diabetic chronic kidney disease: Secondary | ICD-10-CM | POA: Diagnosis not present

## 2020-02-18 DIAGNOSIS — N184 Chronic kidney disease, stage 4 (severe): Secondary | ICD-10-CM | POA: Diagnosis not present

## 2020-02-18 DIAGNOSIS — Z6841 Body Mass Index (BMI) 40.0 and over, adult: Secondary | ICD-10-CM | POA: Diagnosis not present

## 2020-02-18 DIAGNOSIS — D649 Anemia, unspecified: Secondary | ICD-10-CM | POA: Diagnosis not present

## 2020-02-18 DIAGNOSIS — I5030 Unspecified diastolic (congestive) heart failure: Secondary | ICD-10-CM | POA: Diagnosis not present

## 2020-02-25 ENCOUNTER — Encounter (HOSPITAL_COMMUNITY): Payer: Medicare HMO

## 2020-03-03 ENCOUNTER — Ambulatory Visit (HOSPITAL_COMMUNITY)
Admission: RE | Admit: 2020-03-03 | Discharge: 2020-03-03 | Disposition: A | Discharge status: Home / Self Care | Payer: Medicare HMO | Source: Ambulatory Visit | Attending: Internal Medicine | Admitting: Internal Medicine

## 2020-03-03 DIAGNOSIS — D509 Iron deficiency anemia, unspecified: Secondary | ICD-10-CM | POA: Diagnosis not present

## 2020-03-03 MED ORDER — SODIUM CHLORIDE 0.9 % IV SOLN
510.0000 mg | Freq: Once | INTRAVENOUS | Status: AC
  Administered 2020-03-03: 510 mg via INTRAVENOUS
  Filled 2020-03-03: qty 17

## 2020-03-03 NOTE — Discharge Instructions (Signed)

## 2020-03-05 ENCOUNTER — Emergency Department (HOSPITAL_COMMUNITY): Payer: Medicare HMO

## 2020-03-05 ENCOUNTER — Other Ambulatory Visit: Payer: Self-pay

## 2020-03-05 ENCOUNTER — Observation Stay (HOSPITAL_COMMUNITY)
Admission: EM | Admit: 2020-03-05 | Discharge: 2020-03-06 | Disposition: A | Discharge status: Home / Self Care | Payer: Medicare HMO | Attending: Family Medicine | Admitting: Family Medicine

## 2020-03-05 ENCOUNTER — Encounter (HOSPITAL_COMMUNITY): Payer: Self-pay | Admitting: Internal Medicine

## 2020-03-05 DIAGNOSIS — N179 Acute kidney failure, unspecified: Secondary | ICD-10-CM | POA: Diagnosis not present

## 2020-03-05 DIAGNOSIS — Z79899 Other long term (current) drug therapy: Secondary | ICD-10-CM | POA: Diagnosis not present

## 2020-03-05 DIAGNOSIS — E1129 Type 2 diabetes mellitus with other diabetic kidney complication: Secondary | ICD-10-CM | POA: Diagnosis not present

## 2020-03-05 DIAGNOSIS — Z6841 Body Mass Index (BMI) 40.0 and over, adult: Secondary | ICD-10-CM | POA: Insufficient documentation

## 2020-03-05 DIAGNOSIS — R0902 Hypoxemia: Secondary | ICD-10-CM

## 2020-03-05 DIAGNOSIS — I1310 Hypertensive heart and chronic kidney disease without heart failure, with stage 1 through stage 4 chronic kidney disease, or unspecified chronic kidney disease: Secondary | ICD-10-CM | POA: Diagnosis present

## 2020-03-05 DIAGNOSIS — I5033 Acute on chronic diastolic (congestive) heart failure: Secondary | ICD-10-CM | POA: Diagnosis present

## 2020-03-05 DIAGNOSIS — I10 Essential (primary) hypertension: Secondary | ICD-10-CM

## 2020-03-05 DIAGNOSIS — Z20822 Contact with and (suspected) exposure to covid-19: Secondary | ICD-10-CM | POA: Diagnosis not present

## 2020-03-05 DIAGNOSIS — E1122 Type 2 diabetes mellitus with diabetic chronic kidney disease: Secondary | ICD-10-CM | POA: Diagnosis not present

## 2020-03-05 DIAGNOSIS — I5030 Unspecified diastolic (congestive) heart failure: Secondary | ICD-10-CM | POA: Diagnosis not present

## 2020-03-05 DIAGNOSIS — N189 Chronic kidney disease, unspecified: Secondary | ICD-10-CM | POA: Diagnosis not present

## 2020-03-05 DIAGNOSIS — E8770 Fluid overload, unspecified: Secondary | ICD-10-CM | POA: Diagnosis not present

## 2020-03-05 DIAGNOSIS — N184 Chronic kidney disease, stage 4 (severe): Secondary | ICD-10-CM | POA: Diagnosis not present

## 2020-03-05 DIAGNOSIS — I1311 Hypertensive heart and chronic kidney disease without heart failure, with stage 5 chronic kidney disease, or end stage renal disease: Secondary | ICD-10-CM | POA: Diagnosis not present

## 2020-03-05 DIAGNOSIS — M6281 Muscle weakness (generalized): Secondary | ICD-10-CM | POA: Insufficient documentation

## 2020-03-05 DIAGNOSIS — Z794 Long term (current) use of insulin: Secondary | ICD-10-CM | POA: Insufficient documentation

## 2020-03-05 DIAGNOSIS — Z791 Long term (current) use of non-steroidal anti-inflammatories (NSAID): Secondary | ICD-10-CM | POA: Insufficient documentation

## 2020-03-05 DIAGNOSIS — R0689 Other abnormalities of breathing: Secondary | ICD-10-CM | POA: Diagnosis not present

## 2020-03-05 DIAGNOSIS — I213 ST elevation (STEMI) myocardial infarction of unspecified site: Secondary | ICD-10-CM | POA: Diagnosis not present

## 2020-03-05 DIAGNOSIS — E8779 Other fluid overload: Secondary | ICD-10-CM

## 2020-03-05 DIAGNOSIS — R0602 Shortness of breath: Secondary | ICD-10-CM | POA: Diagnosis not present

## 2020-03-05 DIAGNOSIS — J9601 Acute respiratory failure with hypoxia: Secondary | ICD-10-CM | POA: Diagnosis not present

## 2020-03-05 DIAGNOSIS — I132 Hypertensive heart and chronic kidney disease with heart failure and with stage 5 chronic kidney disease, or end stage renal disease: Secondary | ICD-10-CM | POA: Diagnosis not present

## 2020-03-05 DIAGNOSIS — I130 Hypertensive heart and chronic kidney disease with heart failure and stage 1 through stage 4 chronic kidney disease, or unspecified chronic kidney disease: Secondary | ICD-10-CM | POA: Diagnosis not present

## 2020-03-05 DIAGNOSIS — E785 Hyperlipidemia, unspecified: Secondary | ICD-10-CM | POA: Diagnosis not present

## 2020-03-05 DIAGNOSIS — I491 Atrial premature depolarization: Secondary | ICD-10-CM | POA: Diagnosis not present

## 2020-03-05 HISTORY — DX: Morbid (severe) obesity due to excess calories: E66.01

## 2020-03-05 LAB — COMPREHENSIVE METABOLIC PANEL
ALT: 34 U/L (ref 0–44)
AST: 27 U/L (ref 15–41)
Albumin: 3.7 g/dL (ref 3.5–5.0)
Alkaline Phosphatase: 81 U/L (ref 38–126)
Anion gap: 11 (ref 5–15)
BUN: 104 mg/dL — ABNORMAL HIGH (ref 8–23)
CO2: 19 mmol/L — ABNORMAL LOW (ref 22–32)
Calcium: 8.3 mg/dL — ABNORMAL LOW (ref 8.9–10.3)
Chloride: 113 mmol/L — ABNORMAL HIGH (ref 98–111)
Creatinine, Ser: 3.12 mg/dL — ABNORMAL HIGH (ref 0.44–1.00)
GFR calc Af Amer: 18 mL/min — ABNORMAL LOW (ref >60)
GFR calc non Af Amer: 15 mL/min — ABNORMAL LOW (ref >60)
Glucose, Bld: 149 mg/dL — ABNORMAL HIGH (ref 70–99)
Potassium: 5.1 mmol/L (ref 3.5–5.1)
Sodium: 143 mmol/L (ref 135–145)
Total Bilirubin: 0.5 mg/dL (ref 0.3–1.2)
Total Protein: 7.4 g/dL (ref 6.5–8.1)

## 2020-03-05 LAB — GLUCOSE, CAPILLARY
Glucose-Capillary: 232 mg/dL — ABNORMAL HIGH (ref 70–99)
Glucose-Capillary: 210 mg/dL — ABNORMAL HIGH (ref 70–99)
Glucose-Capillary: 170 mg/dL — ABNORMAL HIGH (ref 70–99)

## 2020-03-05 LAB — PROTEIN / CREATININE RATIO, URINE
Creatinine, Urine: 31.52 mg/dL
Protein Creatinine Ratio: 0.82 mg/mg{Cre} — ABNORMAL HIGH (ref 0.00–0.15)
Total Protein, Urine: 26 mg/dL

## 2020-03-05 LAB — CBC
HCT: 32.8 % — ABNORMAL LOW (ref 36.0–46.0)
Hemoglobin: 9.8 g/dL — ABNORMAL LOW (ref 12.0–15.0)
MCH: 26.6 pg (ref 26.0–34.0)
MCHC: 29.9 g/dL — ABNORMAL LOW (ref 30.0–36.0)
MCV: 88.9 fL (ref 80.0–100.0)
Platelets: 282 10*3/uL (ref 150–400)
RBC: 3.69 MIL/uL — ABNORMAL LOW (ref 3.87–5.11)
RDW: 15.1 % (ref 11.5–15.5)
WBC: 7.3 10*3/uL (ref 4.0–10.5)
nRBC: 0 % (ref 0.0–0.2)

## 2020-03-05 LAB — URINALYSIS, ROUTINE W REFLEX MICROSCOPIC
Bacteria, UA: NONE SEEN
Bilirubin Urine: NEGATIVE
Glucose, UA: NEGATIVE mg/dL
Ketones, ur: NEGATIVE mg/dL
Leukocytes,Ua: NEGATIVE
Nitrite: NEGATIVE
Protein, ur: 30 mg/dL — AB
Specific Gravity, Urine: 1.008 (ref 1.005–1.030)
pH: 5 (ref 5.0–8.0)

## 2020-03-05 LAB — SODIUM, URINE, RANDOM: Sodium, Ur: 95 mmol/L

## 2020-03-05 LAB — I-STAT CHEM 8, ED
BUN: 98 mg/dL — ABNORMAL HIGH (ref 8–23)
Calcium, Ion: 1.01 mmol/L — ABNORMAL LOW (ref 1.15–1.40)
Chloride: 113 mmol/L — ABNORMAL HIGH (ref 98–111)
Creatinine, Ser: 3.4 mg/dL — ABNORMAL HIGH (ref 0.44–1.00)
Glucose, Bld: 146 mg/dL — ABNORMAL HIGH (ref 70–99)
HCT: 31 % — ABNORMAL LOW (ref 36.0–46.0)
Hemoglobin: 10.5 g/dL — ABNORMAL LOW (ref 12.0–15.0)
Potassium: 5 mmol/L (ref 3.5–5.1)
Sodium: 143 mmol/L (ref 135–145)
TCO2: 19 mmol/L — ABNORMAL LOW (ref 22–32)

## 2020-03-05 LAB — BRAIN NATRIURETIC PEPTIDE: B Natriuretic Peptide: 66.5 pg/mL (ref 0.0–100.0)

## 2020-03-05 LAB — HEMOGLOBIN A1C
Hgb A1c MFr Bld: 7.8 % — ABNORMAL HIGH (ref 4.8–5.6)
Mean Plasma Glucose: 177.16 mg/dL

## 2020-03-05 LAB — SARS CORONAVIRUS 2 BY RT PCR (HOSPITAL ORDER, PERFORMED IN ~~LOC~~ HOSPITAL LAB): SARS Coronavirus 2: NEGATIVE

## 2020-03-05 LAB — HIV ANTIBODY (ROUTINE TESTING W REFLEX): HIV Screen 4th Generation wRfx: NONREACTIVE

## 2020-03-05 LAB — MAGNESIUM: Magnesium: 2.4 mg/dL (ref 1.7–2.4)

## 2020-03-05 LAB — CREATININE, URINE, RANDOM: Creatinine, Urine: 31.08 mg/dL

## 2020-03-05 MED ORDER — HEPARIN SODIUM (PORCINE) 5000 UNIT/ML IJ SOLN
5000.0000 [IU] | Freq: Three times a day (TID) | INTRAMUSCULAR | Status: DC
  Administered 2020-03-05 – 2020-03-06 (×3): 5000 [IU] via SUBCUTANEOUS
  Filled 2020-03-05 (×3): qty 1

## 2020-03-05 MED ORDER — INSULIN ASPART 100 UNIT/ML ~~LOC~~ SOLN
0.0000 [IU] | Freq: Three times a day (TID) | SUBCUTANEOUS | Status: DC
  Administered 2020-03-05: 5 [IU] via SUBCUTANEOUS

## 2020-03-05 MED ORDER — CAMPHOR-MENTHOL 0.5-0.5 % EX LOTN
1.0000 "application " | TOPICAL_LOTION | Freq: Three times a day (TID) | CUTANEOUS | Status: DC | PRN
  Administered 2020-03-06: 1 via TOPICAL
  Filled 2020-03-05 (×2): qty 222

## 2020-03-05 MED ORDER — POTASSIUM CHLORIDE CRYS ER 20 MEQ PO TBCR: 20.0000 meq | EXTENDED_RELEASE_TABLET | Freq: Once | ORAL | Status: DC

## 2020-03-05 MED ORDER — NITROGLYCERIN 2 % TD OINT
1.0000 [in_us] | TOPICAL_OINTMENT | Freq: Four times a day (QID) | TRANSDERMAL | Status: DC
  Administered 2020-03-05 (×2): 1 [in_us] via TOPICAL
  Filled 2020-03-05 (×2): qty 1

## 2020-03-05 MED ORDER — POLYETHYLENE GLYCOL 3350 17 G PO PACK: 17.0000 g | PACK | Freq: Every day | ORAL | Status: DC | PRN

## 2020-03-05 MED ORDER — NEPRO/CARBSTEADY PO LIQD
237.0000 mL | Freq: Three times a day (TID) | ORAL | Status: DC | PRN
  Filled 2020-03-05: qty 237

## 2020-03-05 MED ORDER — ATORVASTATIN CALCIUM 40 MG PO TABS
40.0000 mg | ORAL_TABLET | Freq: Every day | ORAL | Status: DC
  Administered 2020-03-05 – 2020-03-06 (×2): 40 mg via ORAL
  Filled 2020-03-05 (×2): qty 1

## 2020-03-05 MED ORDER — FUROSEMIDE 10 MG/ML IJ SOLN
80.0000 mg | Freq: Two times a day (BID) | INTRAMUSCULAR | Status: DC
  Administered 2020-03-05 – 2020-03-06 (×2): 80 mg via INTRAVENOUS
  Filled 2020-03-05 (×2): qty 8

## 2020-03-05 MED ORDER — ONDANSETRON HCL 4 MG/2ML IJ SOLN: 4.0000 mg | Freq: Four times a day (QID) | INTRAMUSCULAR | Status: DC | PRN

## 2020-03-05 MED ORDER — ACETAMINOPHEN 650 MG RE SUPP: 650.0000 mg | Freq: Four times a day (QID) | RECTAL | Status: DC | PRN

## 2020-03-05 MED ORDER — INSULIN ASPART PROT & ASPART (70-30 MIX) 100 UNIT/ML ~~LOC~~ SUSP
60.0000 [IU] | Freq: Every day | SUBCUTANEOUS | Status: DC
  Administered 2020-03-06: 60 [IU] via SUBCUTANEOUS
  Filled 2020-03-05: qty 10

## 2020-03-05 MED ORDER — INSULIN ASPART PROT & ASPART (70-30 MIX) 100 UNIT/ML ~~LOC~~ SUSP
50.0000 [IU] | Freq: Every day | SUBCUTANEOUS | Status: DC
  Administered 2020-03-05: 50 [IU] via SUBCUTANEOUS
  Filled 2020-03-05: qty 10

## 2020-03-05 MED ORDER — ZOLPIDEM TARTRATE 5 MG PO TABS
5.0000 mg | ORAL_TABLET | Freq: Every evening | ORAL | Status: DC | PRN
  Administered 2020-03-05: 5 mg via ORAL
  Filled 2020-03-05: qty 1

## 2020-03-05 MED ORDER — BISACODYL 5 MG PO TBEC: 5.0000 mg | DELAYED_RELEASE_TABLET | Freq: Every day | ORAL | Status: DC | PRN

## 2020-03-05 MED ORDER — ACETAMINOPHEN 325 MG PO TABS
650.0000 mg | ORAL_TABLET | Freq: Four times a day (QID) | ORAL | Status: DC | PRN
  Administered 2020-03-05: 650 mg via ORAL
  Filled 2020-03-05: qty 2

## 2020-03-05 MED ORDER — CARVEDILOL 25 MG PO TABS
25.0000 mg | ORAL_TABLET | Freq: Two times a day (BID) | ORAL | Status: DC
  Administered 2020-03-05 – 2020-03-06 (×2): 25 mg via ORAL
  Filled 2020-03-05 (×2): qty 1

## 2020-03-05 MED ORDER — INSULIN ASPART 100 UNIT/ML ~~LOC~~ SOLN: 0.0000 [IU] | Freq: Every day | SUBCUTANEOUS | Status: DC

## 2020-03-05 MED ORDER — DOCUSATE SODIUM 100 MG PO CAPS
100.0000 mg | ORAL_CAPSULE | Freq: Two times a day (BID) | ORAL | Status: DC
  Administered 2020-03-05: 100 mg via ORAL
  Filled 2020-03-05 (×3): qty 1

## 2020-03-05 MED ORDER — SODIUM CHLORIDE 0.9 % IV SOLN: 250.0000 mL | INTRAVENOUS | Status: DC | PRN

## 2020-03-05 MED ORDER — SODIUM CHLORIDE 0.9% FLUSH: 3.0000 mL | INTRAVENOUS | Status: DC | PRN

## 2020-03-05 MED ORDER — FUROSEMIDE 10 MG/ML IJ SOLN: 40.0000 mg | Freq: Two times a day (BID) | INTRAMUSCULAR | Status: DC

## 2020-03-05 MED ORDER — CALCIUM CARBONATE ANTACID 1250 MG/5ML PO SUSP
500.0000 mg | Freq: Four times a day (QID) | ORAL | Status: DC | PRN
  Filled 2020-03-05: qty 5

## 2020-03-05 MED ORDER — HYDROXYZINE HCL 25 MG PO TABS: 25.0000 mg | ORAL_TABLET | Freq: Three times a day (TID) | ORAL | Status: DC | PRN

## 2020-03-05 MED ORDER — ALBUTEROL SULFATE (2.5 MG/3ML) 0.083% IN NEBU: 3.0000 mL | INHALATION_SOLUTION | Freq: Four times a day (QID) | RESPIRATORY_TRACT | Status: DC | PRN

## 2020-03-05 MED ORDER — FUROSEMIDE 10 MG/ML IJ SOLN
80.0000 mg | Freq: Once | INTRAMUSCULAR | Status: AC
  Administered 2020-03-05: 80 mg via INTRAVENOUS
  Filled 2020-03-05: qty 8

## 2020-03-05 MED ORDER — POTASSIUM CHLORIDE CRYS ER 20 MEQ PO TBCR: 20.0000 meq | EXTENDED_RELEASE_TABLET | Freq: Every day | ORAL | Status: DC

## 2020-03-05 MED ORDER — ONDANSETRON HCL 4 MG PO TABS: 4.0000 mg | ORAL_TABLET | Freq: Four times a day (QID) | ORAL | Status: DC | PRN

## 2020-03-05 MED ORDER — SODIUM CHLORIDE 0.9% FLUSH
3.0000 mL | Freq: Two times a day (BID) | INTRAVENOUS | Status: DC
  Administered 2020-03-05 – 2020-03-06 (×2): 3 mL via INTRAVENOUS

## 2020-03-05 MED ORDER — HYDRALAZINE HCL 20 MG/ML IJ SOLN: 5.0000 mg | INTRAMUSCULAR | Status: DC | PRN

## 2020-03-05 NOTE — Consult Note (Signed)
Latasha [name] Admit Date: 03/05/2020 03/05/2020 [name] Requesting Physician:  Karmen Bongo  Reason for Consult:  Volume overload, CKD HPI:  64 year old female with history of obesity, HTN, HLD, DM2, CKD 4, HFpEF presented to the hospital with worsening shortness of breath.  The patient states that for the past couple days she has had worsening shortness of breath.  She is also noted that she has been gaining a lot of fluid.  She follows a [facility] and was found to have weight that is going up and the patient is not sure how long she has been gaining weight.  She is also noticed peripheral edema.  She has had some shortness of breath at rest but particularly dyspnea on exertion.  She tried CPAP at home with minimal improvement in her symptoms.  They came to the emergency department with the assistance of EMS.  The patient is prescribed Lasix and admits that she often forgets her second dose of Lasix.    In the emergency department she was noted to be volume overloaded and was given IV diuretics.  She was given supplemental oxygen with some improvement in her symptoms.  She currently denies fevers, chills, chest pain, nausea, vomiting, diarrhea.  PMH Incudes: Past Medical History:  Diagnosis Date  . BPPV (benign paroxysmal positional vertigo)   . Chronic congestive heart failure with left ventricular diastolic dysfunction ([place])   . CKD (chronic kidney disease), stage IV ([place]) 05/15/2017  . Diabetes mellitus   . Hyperlipidemia   . Hypertension   . Left atrial enlargement   . Morbid obesity with BMI of 40.0-44.9, adult ([place])   . Palpitations        Creatinine, Ser (mg/dL)  Date Value  03/05/2020 3.40 (H)  03/05/2020 3.12 (H)  03/10/2018 2.07 (H)  03/09/2018 2.40 (H)  03/07/2018 2.17 (H)  06/10/2017 2.60 (H)  06/09/2017 2.52 (H)  06/08/2017 2.53 (H)  06/07/2017 2.63 (H)  05/17/2017 2.28 (H)  ] I/Os:  ROS  Balance of 12 systems is negative w/ exceptions as  above  PMH  Past Medical History:  Diagnosis Date  . BPPV (benign paroxysmal positional vertigo)   . Chronic congestive heart failure with left ventricular diastolic dysfunction ([place])   . CKD (chronic kidney disease), stage IV ([place]) 05/15/2017  . Diabetes mellitus   . Hyperlipidemia   . Hypertension   . Left atrial enlargement   . Morbid obesity with BMI of 40.0-44.9, adult ([place])   . Palpitations    PSH  Past Surgical History:  Procedure Laterality Date  . ABDOMINAL HYSTERECTOMY    . EYE SURGERY     cataract/left eye, right eye had bleeding in back  . KNEE SURGERY    . LEFT HEART CATH AND CORONARY ANGIOGRAPHY N/A 03/09/2018   Procedure: LEFT HEART CATH AND CORONARY ANGIOGRAPHY;  Surgeon: Lorretta Harp, MD;  Location: [facility];  Service: Cardiovascular;  Laterality: N/A;  . TRIGGER FINGER RELEASE     FH  Family History  Problem Relation Age of Onset  . Heart disease Father   . Hypertension Father   . Alzheimer's disease Mother   . Diabetes Mother   . Diabetes Sister   . Dementia Other    SH  reports that she has never smoked. She has never used smokeless tobacco. She reports current alcohol use. She reports that she does not use drugs. Allergies No Known Allergies Home medications Prior to Admission medications   Medication Sig Start Date  End Date Taking? Authorizing Provider  albuterol (PROVENTIL HFA;VENTOLIN HFA) 108 (90 Base) MCG/ACT inhaler Inhale 2 puffs into the lungs every 6 (six) hours as needed for wheezing or shortness of breath.   Yes [provider]  amLODipine (NORVASC) 10 MG tablet Take 1 tablet (10 mg total) by mouth daily. 05/18/17  Yes Eugenie Filler, MD  atorvastatin (LIPITOR) 40 MG tablet Take 40 mg by mouth daily.    Yes [provider]  carvedilol (COREG) 25 MG tablet Take 1 tablet (25 mg total) by mouth 2 (two) times daily with a meal. 03/10/18  Yes Florencia Reasons, MD  diphenhydrAMINE (BENADRYL) 25 mg capsule Take 1 capsule  (25 mg total) by mouth every 6 (six) hours as needed for itching. 03/10/18  Yes Florencia Reasons, MD  furosemide (LASIX) 40 MG tablet Take 1 tablet (40 mg total) by mouth every evening. 06/10/17  Yes Sheikh, Omair Latif, DO  furosemide (LASIX) 80 MG tablet Take 1 tablet (80 mg total) by mouth every morning. Patient taking differently: Take 80 mg by mouth 2 (two) times daily.  06/11/17  Yes Sheikh, Omair Latif, DO  gabapentin (NEURONTIN) 100 MG capsule Take 100 mg by mouth 3 (three) times daily.    Yes [provider]  insulin aspart protamine- aspart (NOVOLOG MIX 70/30) (70-30) 100 UNIT/ML injection Inject 50-60 Units into the skin See admin instructions. Use 60 units every morning and use 50 units every evening   Yes [provider]  nitroGLYCERIN (NITROSTAT) 0.4 MG SL tablet Place 1 tablet (0.4 mg total) under the tongue every 5 (five) minutes as needed for chest pain. 03/10/18  Yes Florencia Reasons, MD  potassium chloride SA (K-DUR,KLOR-CON) 20 MEQ tablet Take 1 tablet (20 mEq total) by mouth daily. 06/11/17  Yes Sheikh, Omair Latif, DO    Current Medications Scheduled Meds: . atorvastatin  40 mg Oral Daily  . carvedilol  25 mg Oral BID WC  . docusate sodium  100 mg Oral BID  . furosemide  80 mg Intravenous Q12H  . heparin  5,000 Units Subcutaneous Q8H  . insulin aspart  0-15 Units Subcutaneous TID WC  . insulin aspart  0-5 Units Subcutaneous QHS  . insulin aspart protamine- aspart  50 Units Subcutaneous Q supper  . [START ON 03/06/2020] insulin aspart protamine- aspart  60 Units Subcutaneous Q breakfast  . nitroGLYCERIN  1 inch Topical Q6H  . sodium chloride flush  3 mL Intravenous Q12H   Continuous Infusions: . sodium chloride     PRN Meds:.sodium chloride, acetaminophen **OR** acetaminophen, albuterol, bisacodyl, calcium carbonate (dosed in mg elemental calcium), camphor-menthol **AND** hydrOXYzine, feeding supplement (NEPRO CARB STEADY), hydrALAZINE, ondansetron **OR** ondansetron (ZOFRAN)  IV, polyethylene glycol, sodium chloride flush, zolpidem  CBC Recent Labs  Lab 03/05/20 0416 03/05/20 0425  WBC 7.3  --   HGB 9.8* 10.5*  HCT 32.8* 31.0*  MCV 88.9  --   PLT 282  --    Basic Metabolic Panel Recent Labs  Lab 03/05/20 0416 03/05/20 0425  NA 143 143  K 5.1 5.0  CL 113* 113*  CO2 19*  --   GLUCOSE 149* 146*  BUN 104* 98*  CREATININE 3.12* 3.40*  CALCIUM 8.3*  --     Physical Exam  Blood pressure (!) 164/65, pulse 75, temperature 97.7 F (36.5 C), temperature source Oral, resp. rate 18, height 5\' 4"  (1.626 m), weight 118.4 kg, SpO2 95 %. GEN: w obese, lying in bed, no distress ENT: no nasal discharge,  mmm EYES: no scleral icterus, eomi CV: normal rate, no rubs PULM: Mild increased work of breathing, no audible crackles ABD: NABS, large, mildly distended SKIN: no rashes or jaundice EXT: 2+ pitting edema in the bilateral lower extremities , warm and well perfused   Assessment 64 year old female with history of obesity, HTN, HLD, DM2, CKD 4, HFpEF presented to the hospital with worsening shortness of breath.  Plan 1. Nonoliguric AKI on CKD stage IV: Likely cardiorenal syndrome creatinine elevation at 3.4?  3.1 drawn at the same time.  Either way slightly above baseline presumed to be 2.5.  Given her volume overload I think cardiorenal syndrome is most likely cause.  Agree with ongoing diuresis 1. Lasix 80 mg twice daily 2. Increase Lasix as needed to achieve net -1 to 2 L daily 3. Follow-up results of echocardiogram 4. Continue monitor renal function panel daily 5. Intake and output, daily weights 2. Hypoxic respiratory failure: Possibly related to pulmonary edema.  Pulmonary embolism is also possible given minimal changes on x-ray.  Diuresis as above and titrate down oxygen as able 3. Heart failure exacerbation: Diastolic dysfunction in the past.  BNP is normal today.  Echocardiogram obtained today to evaluate ejection fraction 4. Hyperlipidemia: Continue  home Lipitor 5. Hypertension: Continue home Norvasc and Coreg.  Consider additional medications if she remains hypertensive 6. Diabetes mellitus type 2 uncontrolled with hyperglycemia: Insulin management per primary team. 7. Morbid obesity: Likely contributing to hypoxic respiratory failure with large chest wall.  Continue dietary counseling and encourage exercise.  Plans communicated to the primary team   [name]  [phone] pgr 03/05/2020, 2:57 PM

## 2020-03-05 NOTE — ED Notes (Signed)
Pt O2 saturation 87% on room air, placed on 4L [place]. O2 improved to 99%.

## 2020-03-05 NOTE — ED Notes (Signed)
RN called lab to check on status of BNP. Lab stated it is in process.

## 2020-03-05 NOTE — ED Triage Notes (Signed)
Pt arrives via EMS from home where she has been having SOB for 2 days. Pt reports history of pulmonary edema and she takes lasix twice a day but has been occasionally missing her evening dose. Pt was given 1 nitro by EMS. Pt initial O2 was 92% and pt was placed on non-rebreather by fire.

## 2020-03-05 NOTE — Progress Notes (Signed)
Pt was seen for mobility with HHA but may need assistive device for more independent gait.  Has walker and canes at home, and with support of spouse will be more capable of maneuvering safely.  Has plan for HHPT and monitoring of O2 sats, and will continue therapy with her acutely pending discharge to home.  Monitor O2 sats particularly as she is desaturating in standing at side of bed.  03/05/20 1300  PT Visit Information  Last PT Received On 03/05/20  Assistance Needed +1  History of Present Illness 64 yo female with onset of SOB and LE edema was brought to ED with low O2 sats on room air.  EMT's had given her O2, now referred to PT for mobility ck.  Pt on lasix, diuresing with purwick in place.  PMHx:  CHF, CKD3, HTN, DM, obesity, L atrial valve enlargement, encephalopathy, BPPV, palpitations  Precautions  Precautions Fall  Precaution Comments monitor O2 sats, on 4L O2  Restrictions  Weight Bearing Restrictions No  Home Living  Family/patient expects to be discharged to: Private residence  Living Arrangements Spouse/significant other  Available Help at Discharge Family;Available 24 hours/day  Type of Home House  Home Access Stairs to enter  Entrance Stairs-Number of Steps 2  Entrance Stairs-Rails Right;Left;Can reach both  Home Layout One level  Tax adviser - 2 wheels;Walker - 4 wheels;Cane - single point  Additional Comments home using a device intermittently but does not have a need lately  Prior Function  Level of Independence Independent  Comments no recent falls  Communication  Communication No difficulties  Pain Assessment  Pain Assessment No/denies pain  Cognition  Arousal/Alertness Awake/alert  Behavior During Therapy WFL for tasks assessed/performed  Overall Cognitive Status Within Functional Limits for tasks assessed  Upper Extremity Assessment  Upper Extremity Assessment Overall WFL for tasks assessed  Lower Extremity Assessment   Lower Extremity Assessment Overall WFL for tasks assessed  Cervical / Trunk Assessment  Cervical / Trunk Assessment Normal  Bed Mobility  Overal bed mobility Needs Assistance  Bed Mobility Supine to Sit;Sit to Supine  Supine to sit Min assist  Sit to supine Min guard;Min assist  Transfers  Overall transfer level Needs assistance  Equipment used 1 person hand held assist  Transfers Sit to/from Stand  Sit to Stand Min guard  General transfer comment min guard for safety  Ambulation/Gait  Ambulation/Gait assistance Min guard  Gait Distance (Feet) 2 Feet  Assistive device 1 person hand held assist  Gait Pattern/deviations Step-to pattern  General Gait Details shifting at side of bed but note her O2 sats are dropping to 88% with 4L in standing  Balance  Overall balance assessment Needs assistance  Sitting-balance support Feet supported  Sitting balance-Leahy Scale Good  Standing balance support Single extremity supported;During functional activity  Standing balance-Leahy Scale Fair  General Comments  General comments (skin integrity, edema, etc.) pt is desaturating in standing before bringing in a walking bout, no major SOB but telemetry is indicating changes   Exercises  Exercises Other exercises (LE strength is 4 to 5)  PT - End of Session  Equipment Utilized During Treatment Oxygen  Activity Tolerance Patient limited by fatigue;Treatment limited secondary to medical complications (Comment)  Patient left in bed;with call bell/phone within reach;with nursing/sitter in room  Nurse Communication Mobility status;Other (comment) (repositioning pt with PT and discussion of sat drops)  PT Assessment  PT Recommendation/Assessment Patient needs continued PT services  PT Visit Diagnosis Muscle  weakness (generalized) (M62.81);Other (comment) (Desaturation)  PT Problem List Decreased strength;Decreased range of motion;Decreased activity tolerance;Decreased balance;Decreased  mobility;Decreased knowledge of use of DME;Cardiopulmonary status limiting activity  Barriers to Discharge Inaccessible home environment  Barriers to Discharge Comments home with stairs to enter house  PT Plan  PT Frequency (ACUTE ONLY) Min 3X/week  PT Treatment/Interventions (ACUTE ONLY) DME instruction;Gait training;Stair training;Functional mobility training;Therapeutic activities;Therapeutic exercise;Balance training;Neuromuscular re-education;Patient/family education  AM-PAC PT "6 Clicks" Mobility Outcome Measure (Version 2)  Help needed turning from your back to your side while in a flat bed without using bedrails? 3  Help needed moving from lying on your back to sitting on the side of a flat bed without using bedrails? 3  Help needed moving to and from a bed to a chair (including a wheelchair)? 3  Help needed standing up from a chair using your arms (e.g., wheelchair or bedside chair)? 3  Help needed to walk in hospital room? 3  Help needed climbing 3-5 steps with a railing?  2  6 Click Score 17  Consider Recommendation of Discharge To: Home with [facility]  PT Recommendation  Follow Up Recommendations Home health PT;Supervision for mobility/OOB  PT equipment None recommended by PT (has walking equipment at home)  Individuals Consulted  Consulted and Agree with Results and Recommendations Patient;Family member/caregiver  Family Member Consulted husband  Acute Rehab PT Goals  Patient Stated Goal to get her strength back  PT Goal Formulation With patient  Time For Goal Achievement 03/19/20  Potential to Achieve Goals Good  PT Time Calculation  PT Start Time (ACUTE ONLY) 1118  PT Stop Time (ACUTE ONLY) 1147  PT Time Calculation (min) (ACUTE ONLY) 29 min  PT General Charges  $$ ACUTE PT VISIT 1 Visit  PT Evaluation  $PT Eval Moderate Complexity 1 Mod  Written Expression  Dominant Hand Right    Mee Hives, PT MS Acute Rehab Dept. Number: [place] [facility] [place]

## 2020-03-05 NOTE — ED Notes (Signed)
Lunch Tray Ordered @ 1104.  

## 2020-03-05 NOTE — ED Provider Notes (Signed)
[facility] EMERGENCY DEPARTMENT Provider Note  CSN: [id number] Arrival date & time: 03/05/20 1308  Chief Complaint(s) Shortness of Breath  HPI Latasha [name] is a 64 y.o. female   HPI CC: SOB  Onset/Duration: 2 days, gradual Timing: constant, worsening Severity: moderate to severe Modifying Factors:  Improved by: rest  Worsened by: exertion, lying down,  Associated Signs/Symptoms:  Pertinent (+): increased peripheral edema, chest "wheezing"  Pertinent (-): chest pain, fevers, chills, N/V/D, abd pain Context: reports intermittently forgetting to take her medications at home.  Past Medical History Past Medical History:  Diagnosis Date  . BPPV (benign paroxysmal positional vertigo)   . Chronic congestive heart failure with left ventricular diastolic dysfunction ([place])   . CKD (chronic kidney disease), stage III ([place]) 05/15/2017  . Diabetes mellitus   . Hyperlipidemia   . Hypertension   . Left atrial enlargement   . Obesity   . Palpitations    Patient Active Problem List   Diagnosis Date Noted  . Chest pain, rule out acute myocardial infarction 03/07/2018  . CKD (chronic kidney disease) stage 4, GFR 15-29 ml/min (HCC) 03/07/2018  . Hyperphosphatemia 06/09/2017  . Elevated ALT measurement 06/08/2017  . Hypertensive heart disease with acute on chronic diastolic congestive heart failure ([place])   . Bilateral leg edema   . Chronic diastolic CHF (congestive heart failure) ([place]) 06/07/2017  . Acute on chronic diastolic CHF (congestive heart failure) ([place]) 06/07/2017  . Acute respiratory failure with hypoxia ([place]) 06/07/2017  . ARF (acute renal failure) ([place])   . Encephalopathy acute 05/15/2017  . Hyperosmolar non-ketotic state in patient with type 2 diabetes mellitus ([place]) 05/15/2017  . Chronic pain 05/15/2017  . Acute kidney injury superimposed on chronic kidney disease ([place])   . Hyperlipemia 01/19/2014  . Palpitations 01/19/2014  . PVC's (premature  ventricular contractions) 01/19/2014  . SCIATICA, RIGHT 12/12/2010  . GASTROESOPHAGEAL REFLUX DISEASE 02/09/2010  . LEG CRAMPS 02/09/2010  . Morbid obesity ([place]) 12/01/2009  . VITAMIN D DEFICIENCY 10/13/2009  . CONSTIPATION 10/13/2009  . LIVER FUNCTION TESTS, ABNORMAL, HX OF 10/13/2009  . Essential hypertension 08/24/2009  . SHOULDER PAIN, RIGHT, CHRONIC 08/24/2009  . Insulin-requiring or dependent type II diabetes mellitus ([place]) 10/08/1991   Home Medication(s) Prior to Admission medications   Medication Sig Start Date End Date Taking? Authorizing Provider  albuterol (PROVENTIL HFA;VENTOLIN HFA) 108 (90 Base) MCG/ACT inhaler Inhale 2 puffs into the lungs every 6 (six) hours as needed for wheezing or shortness of breath.   Yes [provider]  amLODipine (NORVASC) 10 MG tablet Take 1 tablet (10 mg total) by mouth daily. 05/18/17  Yes Eugenie Filler, MD  atorvastatin (LIPITOR) 40 MG tablet Take 40 mg by mouth daily.    Yes [provider]  carvedilol (COREG) 25 MG tablet Take 1 tablet (25 mg total) by mouth 2 (two) times daily with a meal. 03/10/18  Yes Florencia Reasons, MD  diphenhydrAMINE (BENADRYL) 25 mg capsule Take 1 capsule (25 mg total) by mouth every 6 (six) hours as needed for itching. 03/10/18  Yes Florencia Reasons, MD  furosemide (LASIX) 40 MG tablet Take 1 tablet (40 mg total) by mouth every evening. 06/10/17  Yes Sheikh, Omair Latif, DO  furosemide (LASIX) 80 MG tablet Take 1 tablet (80 mg total) by mouth every morning. Patient taking differently: Take 80 mg by mouth 2 (two) times daily.  06/11/17  Yes Sheikh, Omair Latif, DO  gabapentin (NEURONTIN) 100 MG capsule Take 100 mg by mouth  3 (three) times daily.    Yes [provider]  insulin aspart protamine- aspart (NOVOLOG MIX 70/30) (70-30) 100 UNIT/ML injection Inject 50-60 Units into the skin See admin instructions. Use 60 units every morning and use 50 units every evening   Yes [provider]  nitroGLYCERIN  (NITROSTAT) 0.4 MG SL tablet Place 1 tablet (0.4 mg total) under the tongue every 5 (five) minutes as needed for chest pain. 03/10/18  Yes Florencia Reasons, MD  potassium chloride SA (K-DUR,KLOR-CON) 20 MEQ tablet Take 1 tablet (20 mEq total) by mouth daily. 06/11/17  Yes Sheikh, Omair Latif, DO  hydrALAZINE (APRESOLINE) 25 MG tablet Take 1 tablet (25 mg total) by mouth every 8 (eight) hours. Patient not taking: Reported on 03/05/2020 03/10/18   Florencia Reasons, MD  HYDROcodone-acetaminophen (NORCO/VICODIN) 5-325 MG tablet Take 1 tablet by mouth every 6 (six) hours as needed for moderate pain. Patient not taking: Reported on 03/05/2020 03/10/18   Florencia Reasons, MD  meloxicam (MOBIC) 7.5 MG tablet Take 1 tablet (7.5 mg total) by mouth daily. Patient not taking: Reported on 03/05/2020 11/20/18   Arturo Morton                                                                                                                                    Past Surgical History Past Surgical History:  Procedure Laterality Date  . ABDOMINAL HYSTERECTOMY    . EYE SURGERY     cataract/left eye, right eye had bleeding in back  . KNEE SURGERY    . LEFT HEART CATH AND CORONARY ANGIOGRAPHY N/A 03/09/2018   Procedure: LEFT HEART CATH AND CORONARY ANGIOGRAPHY;  Surgeon: Lorretta Harp, MD;  Location: [facility];  Service: Cardiovascular;  Laterality: N/A;  . TRIGGER FINGER RELEASE     Family History Family History  Problem Relation Age of Onset  . Heart disease Father   . Hypertension Father   . Alzheimer's disease Mother   . Diabetes Mother   . Diabetes Sister   . Dementia Other     Social History Social History   Tobacco Use  . Smoking status: Never Smoker  . Smokeless tobacco: Never Used  Substance Use Topics  . Alcohol use: Yes    Comment:  rare  . Drug use: No   Allergies Patient has no known allergies.  Review of Systems Review of Systems All other systems are reviewed and are negative for acute change except as  noted in the HPI  Physical Exam Vital Signs  I have reviewed the triage vital signs BP (!) 150/60   Pulse 70   Temp 97.6 F (36.4 C) (Oral)   Resp 19   Ht 5\' 4"  (1.626 m)   Wt 118.4 kg   SpO2 97%   BMI 44.80 kg/m   Physical Exam Vitals reviewed.  Constitutional:      General: She is not in acute  distress.    Appearance: She is well-developed. She is not diaphoretic.  HENT:     Head: Normocephalic and atraumatic.     Nose: Nose normal.  Eyes:     General: No scleral icterus.       Right eye: No discharge.        Left eye: No discharge.     Conjunctiva/sclera: Conjunctivae normal.     Pupils: Pupils are equal, round, and reactive to light.  Cardiovascular:     Rate and Rhythm: Normal rate and regular rhythm.     Heart sounds: No murmur. No friction rub. No gallop.   Pulmonary:     Effort: Pulmonary effort is normal. No respiratory distress.     Breath sounds: Normal breath sounds. No stridor. No rales.  Abdominal:     General: There is no distension.     Palpations: Abdomen is soft.     Tenderness: There is no abdominal tenderness.  Musculoskeletal:        General: No tenderness.     Cervical back: Normal range of motion and neck supple.     Right lower leg: 1+ Pitting Edema present.     Left lower leg: 2+ Pitting Edema present.  Skin:    General: Skin is warm and dry.     Findings: No erythema or rash.  Neurological:     Mental Status: She is alert and oriented to person, place, and time.     ED Results and Treatments Labs (all labs ordered are listed, but only abnormal results are displayed) Labs Reviewed  CBC - Abnormal; Notable for the following components:      Result Value   RBC 3.69 (*)    Hemoglobin 9.8 (*)    HCT 32.8 (*)    MCHC 29.9 (*)    All other components within normal limits  COMPREHENSIVE METABOLIC PANEL - Abnormal; Notable for the following components:   Chloride 113 (*)    CO2 19 (*)    Glucose, Bld 149 (*)    BUN 104 (*)     Creatinine, Ser 3.12 (*)    Calcium 8.3 (*)    GFR calc non Af Amer 15 (*)    GFR calc Af Amer 18 (*)    All other components within normal limits  I-STAT CHEM 8, ED - Abnormal; Notable for the following components:   Chloride 113 (*)    BUN 98 (*)    Creatinine, Ser 3.40 (*)    Glucose, Bld 146 (*)    Calcium, Ion 1.01 (*)    TCO2 19 (*)    Hemoglobin 10.5 (*)    HCT 31.0 (*)    All other components within normal limits  SARS CORONAVIRUS 2 BY RT PCR (HOSPITAL ORDER, [facility])  MAGNESIUM  BRAIN NATRIURETIC PEPTIDE  EKG  EKG Interpretation  Date/Time:    Ventricular Rate:    PR Interval:    QRS Duration:   QT Interval:    QTC Calculation:   R Axis:     Text Interpretation:        Radiology DG Chest Portable 1 View  Result Date: 03/05/2020 CLINICAL DATA:  Shortness of breath EXAM: PORTABLE CHEST 1 VIEW COMPARISON:  03/07/2018 FINDINGS: Mild cardiomegaly. Faint interstitial opacities. No pleural effusion or focal consolidation. IMPRESSION: Mild cardiomegaly without overt pulmonary edema. Electronically Signed   By: Ulyses Jarred M.D.   On: 03/05/2020 04:48    Pertinent labs & imaging results that were available during my care of the patient were reviewed by me and considered in my medical decision making (see chart for details).  Medications Ordered in ED Medications  potassium chloride SA (KLOR-CON) CR tablet 20 mEq (20 mEq Oral Not Given 03/05/20 0430)  nitroGLYCERIN (NITROGLYN) 2 % ointment 1 inch (has no administration in time range)  furosemide (LASIX) injection 80 mg (80 mg Intravenous Given 03/05/20 0440)                                                                                                                                    Procedures Procedures  (including critical care time)  Medical Decision Making / ED  Course I have reviewed the nursing notes for this encounter and the patient's prior records (if available in EHR or on provided paperwork).   VENORA KAUTZMAN was evaluated in Emergency Department on 03/05/2020 for the symptoms described in the history of present illness. She was evaluated in the context of the global COVID-19 pandemic, which necessitated consideration that the patient might be at risk for infection with the SARS-CoV-2 virus that causes COVID-19. Institutional protocols and algorithms that pertain to the evaluation of patients at risk for COVID-19 are in a state of rapid change based on information released by regulatory bodies including the CDC and federal and state organizations. These policies and algorithms were followed during the patient's care in the ED.  Volume overload on exam concerning for CHF exacerbation, specially with noncompliance. satting 87% on RA and placed on 3LNC, now 99%. CXR with cardiomegaly, but not edema. EKG w/o acute changes.  Metabolic panel notable for worsening renal insufficiency. Hemoglobin stable.  No leukocytosis. Given a dose of IV Lasix, 60 mg.  Given the worsening renal function with hypoxia and need for diuresing, will consult medicine for admission.        Final Clinical Impression(s) / ED Diagnoses Final diagnoses:  Other hypervolemia  Hypoxia  AKI (acute kidney injury) ([place])      This chart was dictated using voice recognition software.  Despite best efforts to proofread,  errors can occur which can change the documentation meaning.   Fatima Blank, MD 03/05/20 [phone]

## 2020-03-05 NOTE — H&P (Signed)
History and Physical    Latasha [name] TML:[id number] DOB: 1956/07/18 DOA: 03/05/2020  PCP: Shirline Frees, MD Consultants:  Valetta Close - bariatric medicine; Hartford Poli - endocrinology; [facility] - has upcoming appointment, considering bariatric surgery; Johnney Ou - nephrology Patient coming from:  Home - lives with husband; NOKPandora Leiter, [phone]; daughter, [phone]  Chief Complaint:  SOB  HPI: Latasha [name] is a 64 y.o. female with medical history significant of morbid obesity (BMI 44.8); HTN; HLD; DM; stage 4 CKD; and chronic diastolic CHF presenting with SOB.  She reports that symptoms started Friday (2 days ago) - she went to short stay for an iron infusion.  It was too far to walk for the first time.   She returned home and was completely out of breath despite no exertion.  It became very hard to catch her breath. It was impossible to breathe when lying down.  Her son brought his CPAP over and that worked for a little while but she continued to have orthopnea.  Saturday, it was ongoing and she wasn't able to lift her left leg due to edema.  She woke up and couldn't breathe again overnight.  They started driving in and stopped at a fire station because she felt like she wouldn't make it.  They called 911 and brought her in.  With O2, she feels somewhat better.  Her leg is not as tight as it was.  She is diuresing. She is worried about her kidneys.     ED Course:  Diastolic CHF - on Lasix 91/63 but forgets to take it.  Volume up, 2 days of worsening SOB, edema, hypoxia to 87% and worse with exertion.  +orthopnea.  CXR ok.  Normal BNP, but worsening renal insufficiency.  On 2-3L O2 now and feeling better after diuresis.  Review of Systems: As per HPI; otherwise review of systems reviewed and negative.   Ambulatory Status:  Ambulates without assistance or with assistance if her vertigo is keeping her off balance  COVID Vaccine Status:   Complete on 4/9  Past Medical History:  Diagnosis Date  . BPPV  (benign paroxysmal positional vertigo)   . Chronic congestive heart failure with left ventricular diastolic dysfunction ([place])   . CKD (chronic kidney disease), stage IV ([place]) 05/15/2017  . Diabetes mellitus   . Hyperlipidemia   . Hypertension   . Left atrial enlargement   . Morbid obesity with BMI of 40.0-44.9, adult ([place])   . Palpitations     Past Surgical History:  Procedure Laterality Date  . ABDOMINAL HYSTERECTOMY    . EYE SURGERY     cataract/left eye, right eye had bleeding in back  . KNEE SURGERY    . LEFT HEART CATH AND CORONARY ANGIOGRAPHY N/A 03/09/2018   Procedure: LEFT HEART CATH AND CORONARY ANGIOGRAPHY;  Surgeon: Lorretta Harp, MD;  Location: [facility];  Service: Cardiovascular;  Laterality: N/A;  . TRIGGER FINGER RELEASE      Social History   Socioeconomic History  . Marital status: Married    Spouse name: Not on file  . Number of children: Not on file  . Years of education: Not on file  . Highest education level: Not on file  Occupational History  . Occupation: retired  Tobacco Use  . Smoking status: Never Smoker  . Smokeless tobacco: Never Used  Substance and Sexual Activity  . Alcohol use: Yes    Comment:  rare  . Drug use: No  . Sexual activity: Not on  file  Other Topics Concern  . Not on file  Social History Narrative  . Not on file   Social Determinants of Health   Financial Resource Strain:   . Difficulty of Paying Living Expenses:   Food Insecurity:   . Worried About Charity fundraiser in the Last Year:   . Arboriculturist in the Last Year:   Transportation Needs:   . Film/video editor (Medical):   Marland Kitchen Lack of Transportation (Non-Medical):   Physical Activity:   . Days of Exercise per Week:   . Minutes of Exercise per Session:   Stress:   . Feeling of Stress :   Social Connections:   . Frequency of Communication with Friends and Family:   . Frequency of Social Gatherings with Friends and Family:   . Attends Religious  Services:   . Active Member of Clubs or Organizations:   . Attends Archivist Meetings:   Marland Kitchen Marital Status:   Intimate Partner Violence:   . Fear of Current or Ex-Partner:   . Emotionally Abused:   Marland Kitchen Physically Abused:   . Sexually Abused:     No Known Allergies  Family History  Problem Relation Age of Onset  . Heart disease Father   . Hypertension Father   . Alzheimer's disease Mother   . Diabetes Mother   . Diabetes Sister   . Dementia Other     Prior to Admission medications   Medication Sig Start Date End Date Taking? Authorizing Provider  albuterol (PROVENTIL HFA;VENTOLIN HFA) 108 (90 Base) MCG/ACT inhaler Inhale 2 puffs into the lungs every 6 (six) hours as needed for wheezing or shortness of breath.   Yes [provider]  amLODipine (NORVASC) 10 MG tablet Take 1 tablet (10 mg total) by mouth daily. 05/18/17  Yes Eugenie Filler, MD  atorvastatin (LIPITOR) 40 MG tablet Take 40 mg by mouth daily.    Yes [provider]  carvedilol (COREG) 25 MG tablet Take 1 tablet (25 mg total) by mouth 2 (two) times daily with a meal. 03/10/18  Yes Florencia Reasons, MD  diphenhydrAMINE (BENADRYL) 25 mg capsule Take 1 capsule (25 mg total) by mouth every 6 (six) hours as needed for itching. 03/10/18  Yes Florencia Reasons, MD  furosemide (LASIX) 40 MG tablet Take 1 tablet (40 mg total) by mouth every evening. 06/10/17  Yes Sheikh, Omair Latif, DO  furosemide (LASIX) 80 MG tablet Take 1 tablet (80 mg total) by mouth every morning. Patient taking differently: Take 80 mg by mouth 2 (two) times daily.  06/11/17  Yes Sheikh, Omair Latif, DO  gabapentin (NEURONTIN) 100 MG capsule Take 100 mg by mouth 3 (three) times daily.    Yes [provider]  insulin aspart protamine- aspart (NOVOLOG MIX 70/30) (70-30) 100 UNIT/ML injection Inject 50-60 Units into the skin See admin instructions. Use 60 units every morning and use 50 units every evening   Yes [provider]    nitroGLYCERIN (NITROSTAT) 0.4 MG SL tablet Place 1 tablet (0.4 mg total) under the tongue every 5 (five) minutes as needed for chest pain. 03/10/18  Yes Florencia Reasons, MD  potassium chloride SA (K-DUR,KLOR-CON) 20 MEQ tablet Take 1 tablet (20 mEq total) by mouth daily. 06/11/17  Yes Sheikh, Omair Latif, DO  hydrALAZINE (APRESOLINE) 25 MG tablet Take 1 tablet (25 mg total) by mouth every 8 (eight) hours. Patient not taking: Reported on 03/05/2020 03/10/18   Florencia Reasons, MD  HYDROcodone-acetaminophen (  NORCO/VICODIN) 5-325 MG tablet Take 1 tablet by mouth every 6 (six) hours as needed for moderate pain. Patient not taking: Reported on 03/05/2020 03/10/18   Florencia Reasons, MD  meloxicam (MOBIC) 7.5 MG tablet Take 1 tablet (7.5 mg total) by mouth daily. Patient not taking: Reported on 03/05/2020 11/20/18   Arturo Morton    Physical Exam: Vitals:   03/05/20 1215 03/05/20 1230 03/05/20 1245 03/05/20 1300  BP: (!) 135/52 (!) 147/56 (!) 154/58 (!) 149/74  Pulse: 73 78 73 73  Resp: (!) 24 (!) 28 (!) 23 (!) 23  Temp:      TempSrc:      SpO2: 96% 94% 96% 96%  Weight:      Height:         . General:  Appears calm and comfortable and is NAD, on [place] O2 . Eyes:  PERRL, EOMI, normal lids, iris . ENT:  grossly normal hearing, lips & tongue, mmm . Neck:  no LAD, masses or thyromegaly . Cardiovascular:  RRR, no m/r/g. 2-3+ L > R LE edema.  Marland Kitchen Respiratory:   CTA bilaterally with no wheezes/rales/rhonchi.  Normal to mildly increased respiratory effort. . Abdomen:  soft, NT, ND, NABS, obese . Back:   normal alignment, no CVAT . Skin:  no rash or induration seen on limited exam . Musculoskeletal:  grossly normal tone BUE/BLE, good ROM, no bony abnormality . Psychiatric:  grossly normal mood and affect, speech fluent and appropriate, AOx3 . Neurologic:  CN 2-12 grossly intact, moves all extremities in coordinated fashion    Radiological Exams on Admission: DG Chest Portable 1 View  Result Date: 03/05/2020 CLINICAL DATA:   Shortness of breath EXAM: PORTABLE CHEST 1 VIEW COMPARISON:  03/07/2018 FINDINGS: Mild cardiomegaly. Faint interstitial opacities. No pleural effusion or focal consolidation. IMPRESSION: Mild cardiomegaly without overt pulmonary edema. Electronically Signed   By: Ulyses Jarred M.D.   On: 03/05/2020 04:48    EKG: Independently reviewed.  NSR with rate 78; nonspecific ST changes with no evidence of acute ischemia   Labs on Admission: I have personally reviewed the available labs and imaging studies at the time of the admission.  Pertinent labs:   Glucose 149 BUN 104/Creatinine 3.12/GFR 18; 56/2.47/23 on 5/10 BNP 66.5 WBC 7.3 Hgb 9.8   Assessment/Plan Principal Problem:   Cardiorenal syndrome Active Problems:   Morbid obesity ([place])   Essential hypertension   Hyperlipemia   Acute kidney injury superimposed on chronic kidney disease (HCC)   Acute on chronic diastolic CHF (congestive heart failure) (HCC)    Cardiorenal syndrome with acute respiratory failure -Patient with known chronic diastolic dysfunction and known stage IV CKD presenting with hypoxic respiratory failure and AKI -She does not appear to be currently in failure - suspect that this happened first and was the precipitant but has since improved -Regardless, with worsening edema and renal failure, will admit for further evaluation and treatment   Cardiorenal syndrome - stage IV CKD -Patient's renal dysfunction appears to significantly worse despite with Lasix -She is also edematous -Baseline elevated creatinine with stage 4 CKD, now stage 5 -Nephrology consult pending -Will observe on telemetry for now -She may need consideration of referral for AV fistula placement and initial HD planning -Hold Neurontin for now   Cardiorenal syndrome - acute on chronic diastolic CHF -Patient with anasarca  -Echo was performed in 05/2017 with preserved EF and grade 1 diastolic dysfunction; will repeat -BNP is normal and CXR does  not show edema and  so active exacerbation does not appear to be the underlying cause at this time -She has been treated with 40 mg PO Lasix in the AM and 80 mg nightly  at home and recently increased to 80 mg BID without improvement -Started on 80mg  IV BID here -Consider cardiology consult pending results of echo    HLD -Continue Lipitor   HTN -Continue Norvasc, Coreg   DM -Prior A1c indicated very poor control remotely; will recheck -She is on 70/30 at home qAM, will continue -Cover with moderate-scale SSI   Morbid obesity Body mass index is 44.8 kg/m.  -Weight loss should be encouraged -Outpatient PCP/bariatric medicine/bariatric surgery f/u encouraged     Note: This patient has been tested and is negative for the novel coronavirus COVID-19.   DVT prophylaxis: Heparin Code Status:  Full - confirmed with patient Family Communication: None present throughout evaluation, but her husband came in at the end of the visit and we reviewed the plan together Disposition Plan: Will depend on progression of renal disease - possibly as early as tomorrow depending on clinical course Consults called: Nephrology Admission status: It is my clinical opinion that referral for OBSERVATION is reasonable and necessary in this patient based on the above information provided. The aforementioned taken together are felt to place the patient at high risk for further clinical deterioration. However it is anticipated that the patient may be medically stable for discharge from the hospital within 24 to 48 hours.       Karmen Bongo MD [facility]   How to contact the [facility] Attending or Consulting provider [place] or covering provider during after hours [place], for this patient?  1. Check the care team in [facility] and look for a) attending/consulting TR[facility] provider listed and b) the [facility] team listed 2. Log into [email or web address] and use [place]'s universal password to access. If you do not have the  password, please contact the hospital operator. 3. Locate the [facility] provider you are looking for under [facility] and page to a number that you can be directly reached. 4. If you still have difficulty reaching the provider, please page the [facility] (Director on Call) for the Hospitalists listed on amion for assistance.   03/05/2020, 1:27 PM

## 2020-03-06 ENCOUNTER — Observation Stay (HOSPITAL_BASED_OUTPATIENT_CLINIC_OR_DEPARTMENT_OTHER): Payer: Medicare HMO

## 2020-03-06 DIAGNOSIS — I5023 Acute on chronic systolic (congestive) heart failure: Secondary | ICD-10-CM | POA: Diagnosis not present

## 2020-03-06 DIAGNOSIS — N184 Chronic kidney disease, stage 4 (severe): Secondary | ICD-10-CM | POA: Diagnosis not present

## 2020-03-06 DIAGNOSIS — I1311 Hypertensive heart and chronic kidney disease without heart failure, with stage 5 chronic kidney disease, or end stage renal disease: Secondary | ICD-10-CM

## 2020-03-06 DIAGNOSIS — E8770 Fluid overload, unspecified: Secondary | ICD-10-CM | POA: Diagnosis not present

## 2020-03-06 DIAGNOSIS — E1129 Type 2 diabetes mellitus with other diabetic kidney complication: Secondary | ICD-10-CM | POA: Diagnosis not present

## 2020-03-06 DIAGNOSIS — R0602 Shortness of breath: Secondary | ICD-10-CM | POA: Diagnosis not present

## 2020-03-06 DIAGNOSIS — I130 Hypertensive heart and chronic kidney disease with heart failure and stage 1 through stage 4 chronic kidney disease, or unspecified chronic kidney disease: Secondary | ICD-10-CM | POA: Diagnosis not present

## 2020-03-06 DIAGNOSIS — J9601 Acute respiratory failure with hypoxia: Secondary | ICD-10-CM | POA: Diagnosis not present

## 2020-03-06 DIAGNOSIS — N179 Acute kidney failure, unspecified: Secondary | ICD-10-CM | POA: Diagnosis not present

## 2020-03-06 DIAGNOSIS — I5030 Unspecified diastolic (congestive) heart failure: Secondary | ICD-10-CM | POA: Diagnosis not present

## 2020-03-06 LAB — BASIC METABOLIC PANEL
Anion gap: 11 (ref 5–15)
BUN: 96 mg/dL — ABNORMAL HIGH (ref 8–23)
CO2: 22 mmol/L (ref 22–32)
Calcium: 8.6 mg/dL — ABNORMAL LOW (ref 8.9–10.3)
Chloride: 109 mmol/L (ref 98–111)
Creatinine, Ser: 2.97 mg/dL — ABNORMAL HIGH (ref 0.44–1.00)
GFR calc Af Amer: 19 mL/min — ABNORMAL LOW (ref >60)
GFR calc non Af Amer: 16 mL/min — ABNORMAL LOW (ref >60)
Glucose, Bld: 132 mg/dL — ABNORMAL HIGH (ref 70–99)
Potassium: 4.4 mmol/L (ref 3.5–5.1)
Sodium: 142 mmol/L (ref 135–145)

## 2020-03-06 LAB — CBC WITH DIFFERENTIAL/PLATELET
Abs Immature Granulocytes: 0.02 10*3/uL (ref 0.00–0.07)
Basophils Absolute: 0 10*3/uL (ref 0.0–0.1)
Basophils Relative: 1 %
Eosinophils Absolute: 0.3 10*3/uL (ref 0.0–0.5)
Eosinophils Relative: 5 %
HCT: 30.3 % — ABNORMAL LOW (ref 36.0–46.0)
Hemoglobin: 9.3 g/dL — ABNORMAL LOW (ref 12.0–15.0)
Immature Granulocytes: 0 %
Lymphocytes Relative: 31 %
Lymphs Abs: 1.8 10*3/uL (ref 0.7–4.0)
MCH: 26.8 pg (ref 26.0–34.0)
MCHC: 30.7 g/dL (ref 30.0–36.0)
MCV: 87.3 fL (ref 80.0–100.0)
Monocytes Absolute: 0.5 10*3/uL (ref 0.1–1.0)
Monocytes Relative: 8 %
Neutro Abs: 3.1 10*3/uL (ref 1.7–7.7)
Neutrophils Relative %: 55 %
Platelets: 271 10*3/uL (ref 150–400)
RBC: 3.47 MIL/uL — ABNORMAL LOW (ref 3.87–5.11)
RDW: 15.2 % (ref 11.5–15.5)
WBC: 5.7 10*3/uL (ref 4.0–10.5)
nRBC: 0 % (ref 0.0–0.2)

## 2020-03-06 LAB — GLUCOSE, CAPILLARY
Glucose-Capillary: 119 mg/dL — ABNORMAL HIGH (ref 70–99)
Glucose-Capillary: 103 mg/dL — ABNORMAL HIGH (ref 70–99)

## 2020-03-06 LAB — ECHOCARDIOGRAM COMPLETE
Height: 64 in
Weight: 4120 oz

## 2020-03-06 MED ORDER — FUROSEMIDE 80 MG PO TABS: 80.0000 mg | ORAL_TABLET | Freq: Two times a day (BID) | ORAL | 0 refills | Status: DC

## 2020-03-06 NOTE — Progress Notes (Signed)
[facility] NEPHROLOGY PROGRESS NOTE  Assessment/ Plan: Pt is a 64 y.o. yo female  history of obesity, HTN, HLD, DM2, CKD 4, HFpEF presented to the hospital with worsening shortness of breath.  #AKI on CKD stage IV, nonoliguric: Likely cardiorenal syndrome causing fluid overload and worsening creatinine level.  She received IV Lasix with much improvement in her breathing and volume status.  Creatinine level trending down with increased urine output.  Apparently she was missing her afternoon Lasix dose. We will resume Lasix 80 mg twice a day.  I recommend salt and fluid restriction.  She is on room air and very eager to go home.  Okay to discharge and recommend to follow with Dr. Johnney Ou in 2 to 3 weeks.  #Acute hypoxic respiratory failure due to pulmonary edema: Improved with diuretics.  On room air now.  #Acute CHF exacerbation, diastolic: Due to missing Lasix dose.  Education provided.  Repeat echo with 65 to 70% EF and impaired LV relaxation.  #Hypertension: Resume home medication.  Monitor blood pressure.  #Anemia of CKD: Hemoglobin 10.5 on admission at goal for CKD.  May need iron and ESA as outpatient.  Discussed with the primary team and the patient's husband.  Subjective: Seen and examined at bedside.  Sitting on bed comfortable and wanted to go home.  She reported feeling much better.  Room air.  Denies nausea, vomiting, chest pain, shortness of breath.  Urine output recorded as 2.6 L.  Creatinine level trending down. Objective Vital signs in last 24 hours: Vitals:   03/05/20 2132 03/06/20 0521 03/06/20 0538 03/06/20 0902  BP: (!) 141/56 (!) 158/60  (!) 172/66  Pulse: 66 73  72  Resp: 18 18    Temp: 97.7 F (36.5 C) 98.1 F (36.7 C)    TempSrc: Oral Oral    SpO2: 96% 97%    Weight:   116.8 kg   Height:       Weight change: -1.588 kg  Intake/Output Summary (Last 24 hours) at 03/06/2020 1303 Last data filed at 03/06/2020 1203 Gross per 24 hour  Intake 550  ml  Output 2900 ml  Net -2350 ml       Labs: Basic Metabolic Panel: Recent Labs  Lab 03/05/20 0416 03/05/20 0425 03/06/20 0207  NA 143 143 142  K 5.1 5.0 4.4  CL 113* 113* 109  CO2 19*  --  22  GLUCOSE 149* 146* 132*  BUN 104* 98* 96*  CREATININE 3.12* 3.40* 2.97*  CALCIUM 8.3*  --  8.6*   Liver Function Tests: Recent Labs  Lab 03/05/20 0416  AST 27  ALT 34  ALKPHOS 81  BILITOT 0.5  PROT 7.4  ALBUMIN 3.7   No results for input(s): LIPASE, AMYLASE in the last 168 hours. No results for input(s): AMMONIA in the last 168 hours. CBC: Recent Labs  Lab 03/05/20 0416 03/05/20 0425 03/06/20 0207  WBC 7.3  --  5.7  NEUTROABS  --   --  3.1  HGB 9.8* 10.5* 9.3*  HCT 32.8* 31.0* 30.3*  MCV 88.9  --  87.3  PLT 282  --  271   Cardiac Enzymes: No results for input(s): CKTOTAL, CKMB, CKMBINDEX, TROPONINI in the last 168 hours. CBG: Recent Labs  Lab 03/05/20 1609 03/05/20 1848 03/05/20 2129 03/06/20 0600 03/06/20 1113  GLUCAP 232* 210* 170* 119* 103*    Iron Studies: No results for input(s): IRON, TIBC, TRANSFERRIN, FERRITIN in the last 72 hours. Studies/Results: DG Chest Portable 1 View  Result Date: 03/05/2020 CLINICAL DATA:  Shortness of breath EXAM: PORTABLE CHEST 1 VIEW COMPARISON:  03/07/2018 FINDINGS: Mild cardiomegaly. Faint interstitial opacities. No pleural effusion or focal consolidation. IMPRESSION: Mild cardiomegaly without overt pulmonary edema. Electronically Signed   By: Ulyses Jarred M.D.   On: 03/05/2020 04:48   ECHOCARDIOGRAM COMPLETE  Result Date: 03/06/2020    ECHOCARDIOGRAM REPORT   Patient Name:   Latasha [name] Date of Exam: 03/06/2020 Medical Rec #:  [id number]      Height:       64.0 in Accession #:    [id number]     Weight:       257.5 lb Date of Birth:  [birth year]      BSA:          2.178 m Patient Age:    10 years       BP:           158/60 mmHg Patient Gender: F              HR:           64 bpm. Exam Location:  Inpatient Procedure: 2D  Echo, Cardiac Doppler and Color Doppler Indications:    I50.23 Acute on chronic systolic (congestive) heart failure  History:        Patient has prior history of Echocardiogram examinations, most                 recent 05/15/2017. Risk Factors:Hypertension, Diabetes and                 Dyslipidemia. CKD.  Sonographer:    Jonelle Sidle Dance Referring Phys: [employer]  1. Left ventricular ejection fraction, by estimation, is 65 to 70%. The left ventricle has normal function. The left ventricle has no regional wall motion abnormalities. There is severe left ventricular hypertrophy. Left ventricular diastolic parameters  are consistent with Grade I diastolic dysfunction (impaired relaxation).  2. Right ventricular systolic function is normal. The right ventricular size is normal. Tricuspid regurgitation signal is inadequate for assessing PA pressure.  3. The mitral valve is normal in structure. Trivial mitral valve regurgitation. No evidence of mitral stenosis.  4. The aortic valve is tricuspid. Aortic valve regurgitation is not visualized. No aortic stenosis is present. Conclusion(s)/Recommendation(s): Normal coronary artery origins. FINDINGS  Left Ventricle: Left ventricular ejection fraction, by estimation, is 65 to 70%. The left ventricle has normal function. The left ventricle has no regional wall motion abnormalities. The left ventricular internal cavity size was normal in size. There is  severe left ventricular hypertrophy. Left ventricular diastolic parameters are consistent with Grade I diastolic dysfunction (impaired relaxation). Right Ventricle: The right ventricular size is normal. No increase in right ventricular wall thickness. Right ventricular systolic function is normal. Tricuspid regurgitation signal is inadequate for assessing PA pressure. Left Atrium: Left atrial size was normal in size. Right Atrium: Right atrial size was normal in size. Pericardium: A small pericardial effusion is  present. Mitral Valve: The mitral valve is normal in structure. Normal mobility of the mitral valve leaflets. Trivial mitral valve regurgitation. No evidence of mitral valve stenosis. Tricuspid Valve: The tricuspid valve is normal in structure. Tricuspid valve regurgitation is trivial. No evidence of tricuspid stenosis. Aortic Valve: The aortic valve is tricuspid. Aortic valve regurgitation is not visualized. No aortic stenosis is present. Pulmonic Valve: The pulmonic valve was normal in structure. Pulmonic valve regurgitation is trivial. No evidence of pulmonic stenosis. Aorta: The aortic root is normal  in size and structure. Venous: The inferior vena cava was not well visualized. IAS/Shunts: The interatrial septum was not well visualized. Additional Comments: LV apex foreshortened, endocardium at apex not well evaluated. Contrast was not given.  LEFT VENTRICLE PLAX 2D LVIDd:         4.72 cm  Diastology LVIDs:         2.13 cm  LV e' lateral:   5.11 cm/s LV PW:         1.70 cm  LV E/e' lateral: 18.8 LV IVS:        1.50 cm  LV e' medial:    5.55 cm/s LVOT diam:     2.20 cm  LV E/e' medial:  17.3 LV SV:         135 LV SV Index:   62 LVOT Area:     3.80 cm  RIGHT VENTRICLE             IVC RV Basal diam:  2.98 cm     IVC diam: 1.97 cm RV S prime:     15.40 cm/s TAPSE (M-mode): 1.7 cm LEFT ATRIUM             Index       RIGHT ATRIUM           Index LA diam:        5.00 cm 2.30 cm/m  RA Area:     15.90 cm LA Vol (A2C):   93.7 ml 43.02 ml/m RA Volume:   39.50 ml  18.13 ml/m LA Vol (A4C):   91.2 ml 41.87 ml/m LA Biplane Vol: 95.1 ml 43.66 ml/m  AORTIC VALVE LVOT Vmax:   131.00 cm/s LVOT Vmean:  98.000 cm/s LVOT VTI:    0.354 m  AORTA Ao Root diam: 3.20 cm Ao Asc diam:  3.40 cm MITRAL VALVE MV Area (PHT): 3.99 cm     SHUNTS MV Decel Time: 190 msec     Systemic VTI:  0.35 m MV E velocity: 96.20 cm/s   Systemic Diam: 2.20 cm MV A velocity: 109.00 cm/s MV E/A ratio:  0.88 Cherlynn Kaiser MD Electronically signed by  Cherlynn Kaiser MD Signature Date/Time: 03/06/2020/12:31:01 PM    Final     Medications: Infusions: . sodium chloride      Scheduled Medications: . atorvastatin  40 mg Oral Daily  . carvedilol  25 mg Oral BID WC  . docusate sodium  100 mg Oral BID  . furosemide  80 mg Intravenous Q12H  . heparin  5,000 Units Subcutaneous Q8H  . insulin aspart  0-15 Units Subcutaneous TID WC  . insulin aspart  0-5 Units Subcutaneous QHS  . insulin aspart protamine- aspart  50 Units Subcutaneous Q supper  . insulin aspart protamine- aspart  60 Units Subcutaneous Q breakfast  . nitroGLYCERIN  1 inch Topical Q6H  . sodium chloride flush  3 mL Intravenous Q12H    have reviewed scheduled and prn medications.  Physical Exam: General:NAD, comfortable Heart:RRR, s1s2 nl, no rubs Lungs:clear b/l, no crackle Abdomen:soft, Non-tender, non-distended Extremities: LE edema+ Neurology: Alert, awake, oriented, no asterixis  Keiarah Orlowski Tanna Furry 03/06/2020,1:03 PM  LOS: 0 days  Pager: [phone]

## 2020-03-06 NOTE — Progress Notes (Signed)
Nsg Discharge Note  Admit Date:  03/05/2020 Discharge date: 03/06/2020   Latasha [name] to be D/C'd Home per MD order.  AVS completed.  Copy for chart, and copy for patient signed, and dated. Patient/caregiver able to verbalize understanding.  Discharge Medication: Allergies as of 03/06/2020   No Known Allergies     Medication List    TAKE these medications   albuterol 108 (90 Base) MCG/ACT inhaler Commonly known as: VENTOLIN HFA Inhale 2 puffs into the lungs every 6 (six) hours as needed for wheezing or shortness of breath.   amLODipine 10 MG tablet Commonly known as: NORVASC Take 1 tablet (10 mg total) by mouth daily.   atorvastatin 40 MG tablet Commonly known as: LIPITOR Take 40 mg by mouth daily.   carvedilol 25 MG tablet Commonly known as: COREG Take 1 tablet (25 mg total) by mouth 2 (two) times daily with a meal.   diphenhydrAMINE 25 mg capsule Commonly known as: BENADRYL Take 1 capsule (25 mg total) by mouth every 6 (six) hours as needed for itching.   furosemide 80 MG tablet Commonly known as: LASIX Take 1 tablet (80 mg total) by mouth 2 (two) times daily. What changed:   when to take this  Another medication with the same name was removed. Continue taking this medication, and follow the directions you see here.   gabapentin 100 MG capsule Commonly known as: NEURONTIN Take 100 mg by mouth 3 (three) times daily.   insulin aspart protamine- aspart (70-30) 100 UNIT/ML injection Commonly known as: NOVOLOG MIX 70/30 Inject 50-60 Units into the skin See admin instructions. Use 60 units every morning and use 50 units every evening   nitroGLYCERIN 0.4 MG SL tablet Commonly known as: NITROSTAT Place 1 tablet (0.4 mg total) under the tongue every 5 (five) minutes as needed for chest pain.   potassium chloride SA 20 MEQ tablet Commonly known as: KLOR-CON Take 1 tablet (20 mEq total) by mouth daily.       Discharge Assessment: Vitals:   03/06/20 0902  03/06/20 1330  BP: (!) 172/66 (!) 151/65  Pulse: 72 66  Resp:  18  Temp:  98.2 F (36.8 C)  SpO2:  95%   Skin clean, dry and intact without evidence of skin break down, no evidence of skin tears noted. IV catheter discontinued intact. Site without signs and symptoms of complications - no redness or edema noted at insertion site, patient denies c/o pain - only slight tenderness at site.  Dressing with slight pressure applied.  D/c Instructions-Education: Discharge instructions given to patient/family with verbalized understanding. D/c education completed with patient/family including follow up instructions, medication list, d/c activities limitations if indicated, with other d/c instructions as indicated by MD - patient able to verbalize understanding, all questions fully answered. Patient instructed to return to ED, call 911, or call MD for any changes in condition.  Patient escorted via [place], and D/C home via private auto.  Erasmo Leventhal, RN 03/06/2020 1:45 PM

## 2020-03-06 NOTE — Discharge Summary (Signed)
Physician Discharge Summary  ERMAL BRZOZOWSKI SWN:[id number] DOB: September 03, 1956 DOA: 03/05/2020  PCP: Shirline Frees, MD  Admit date: 03/05/2020 Discharge date: 03/06/2020  Admitted From: Home Disposition: Home  Recommendations for Outpatient Follow-up:  1. Follow up with PCP in 1-2 weeks  2. Follow-up with nephrology per their recommendations 3. Please obtain BMP/CBC in one week 4. Please follow up with your PCP on the following pending results: Unresulted Labs (From admission, onward)    Start     Ordered   03/06/20 9381  Basic metabolic panel  Daily,   R     03/05/20 1020   03/05/20 1020  Urea nitrogen, urine  Once,   STAT     03/05/20 1019           Home Health: Yes Equipment/Devices: None  Discharge Condition: Stable CODE STATUS: Full code Diet recommendation: Renal  Subjective: Patient seen and examined.  Husband the bedside.  She feels " much better".  No shortness of breath.  She has been off of oxygen since before going to bed last night.  She can speak with me in full sentences.  She wants to go home.  HPI: Latasha [name] is a 64 y.o. female with medical history significant of morbid obesity (BMI 44.8); HTN; HLD; DM; stage 4 CKD; and chronic diastolic CHF presenting with SOB.  She reports that symptoms started Friday (2 days ago) - she went to short stay for an iron infusion.  It was too far to walk for the first time.   She returned home and was completely out of breath despite no exertion.  It became very hard to catch her breath. It was impossible to breathe when lying down.  Her son brought his CPAP over and that worked for a little while but she continued to have orthopnea.  Saturday, it was ongoing and she wasn't able to lift her left leg due to edema.  She woke up and couldn't breathe again overnight.  They started driving in and stopped at a fire station because she felt like she wouldn't make it.  They called 911 and brought her in.  With O2, she feels somewhat better.   Her leg is not as tight as it was.  She is diuresing. She is worried about her kidneys.     ED Course:  Diastolic CHF - on Lasix 82/99 but forgets to take it.  Volume up, 2 days of worsening SOB, edema, hypoxia to 87% and worse with exertion.  +orthopnea.  CXR ok.  Normal BNP, but worsening   Brief/Interim Summary: Patient was admitted under hospital service for acute on chronic diastolic congestive heart failure, cardiorenal syndrome and acute on chronic kidney disease stage IV.  She was started on Lasix IV and nephrology was consulted.  She had significant edema at the time of admission.  When seen this morning, she is feeling much better with no shortness of breath.  She can speak in complete sentences.  She has been off of oxygen since going to bed last night.  She still has +2 pitting edema but she tells me that it is at least 50% better than what it was at the time of admission.  Her renal function had improved but is still slightly above her baseline.  She was seen by nephrology again today per my request and they have cleared her to go home as well.  She knows that she was supposed to take 80 mg of Lasix p.o. twice daily however  she admits that she often forgets the evening dose and skips it.  She now promised that she would be very compliant with Lasix dose.  This was reinforced to her by nephrology as well.  She is being discharged in stable condition.  Follow-up with PCP and nephrology.  Discharge Diagnoses:  Principal Problem:   Cardiorenal syndrome Active Problems:   Morbid obesity ([place])   Essential hypertension   Hyperlipemia   Acute kidney injury superimposed on chronic kidney disease ([place])   Acute on chronic diastolic CHF (congestive heart failure) ([place])    Discharge Instructions   Allergies as of 03/06/2020   No Known Allergies     Medication List    TAKE these medications   albuterol 108 (90 Base) MCG/ACT inhaler Commonly known as: VENTOLIN HFA Inhale 2 puffs into  the lungs every 6 (six) hours as needed for wheezing or shortness of breath.   amLODipine 10 MG tablet Commonly known as: NORVASC Take 1 tablet (10 mg total) by mouth daily.   atorvastatin 40 MG tablet Commonly known as: LIPITOR Take 40 mg by mouth daily.   carvedilol 25 MG tablet Commonly known as: COREG Take 1 tablet (25 mg total) by mouth 2 (two) times daily with a meal.   diphenhydrAMINE 25 mg capsule Commonly known as: BENADRYL Take 1 capsule (25 mg total) by mouth every 6 (six) hours as needed for itching.   furosemide 80 MG tablet Commonly known as: LASIX Take 1 tablet (80 mg total) by mouth 2 (two) times daily. What changed:   when to take this  Another medication with the same name was removed. Continue taking this medication, and follow the directions you see here.   gabapentin 100 MG capsule Commonly known as: NEURONTIN Take 100 mg by mouth 3 (three) times daily.   insulin aspart protamine- aspart (70-30) 100 UNIT/ML injection Commonly known as: NOVOLOG MIX 70/30 Inject 50-60 Units into the skin See admin instructions. Use 60 units every morning and use 50 units every evening   nitroGLYCERIN 0.4 MG SL tablet Commonly known as: NITROSTAT Place 1 tablet (0.4 mg total) under the tongue every 5 (five) minutes as needed for chest pain.   potassium chloride SA 20 MEQ tablet Commonly known as: KLOR-CON Take 1 tablet (20 mEq total) by mouth daily.      Follow-up Information    Shirline Frees, MD Follow up in 1 week(s).   Specialty: Family Medicine Contact information: [place] [address] [phone]        Lorretta Harp, MD .   Specialties: Cardiology, Radiology Contact information: [address] [place] [address] [phone]          No Known Allergies  Consultations: Nephrology   Procedures/Studies: DG Chest Portable 1 View  Result Date: 03/05/2020 CLINICAL DATA:  Shortness of breath  EXAM: PORTABLE CHEST 1 VIEW COMPARISON:  03/07/2018 FINDINGS: Mild cardiomegaly. Faint interstitial opacities. No pleural effusion or focal consolidation. IMPRESSION: Mild cardiomegaly without overt pulmonary edema. Electronically Signed   By: Ulyses Jarred M.D.   On: 03/05/2020 04:48      Discharge Exam: Vitals:   03/06/20 0521 03/06/20 0902  BP: (!) 158/60 (!) 172/66  Pulse: 73 72  Resp: 18   Temp: 98.1 F (36.7 C)   SpO2: 97%    Vitals:   03/05/20 2132 03/06/20 0521 03/06/20 0538 03/06/20 0902  BP: (!) 141/56 (!) 158/60  (!) 172/66  Pulse: 66 73  72  Resp: 18 18    Temp: 97.7 F (36.5 C) 98.1 F (36.7 C)    TempSrc: Oral Oral    SpO2: 96% 97%    Weight:   116.8 kg   Height:        General: Pt is alert, awake, not in acute distress Cardiovascular: RRR, S1/S2 +, no rubs, no gallops Respiratory: CTA bilaterally, no wheezing, no rhonchi Abdominal: Soft, NT, ND, bowel sounds + Extremities: +2 pitting edema bilateral lower extremity, no cyanosis    The results of significant diagnostics from this hospitalization (including imaging, microbiology, ancillary and laboratory) are listed below for reference.     Microbiology: Recent Results (from the past 240 hour(s))  SARS Coronavirus 2 by RT PCR (hospital order, performed in [facility] hospital lab) Nasopharyngeal Nasopharyngeal Swab     Status: None   Collection Time: 03/05/20  7:43 AM   Specimen: Nasopharyngeal Swab  Result Value Ref Range Status   SARS Coronavirus 2 NEGATIVE NEGATIVE Final    Comment: (NOTE) SARS-CoV-2 target nucleic acids are NOT DETECTED. The SARS-CoV-2 RNA is generally detectable in upper and lower respiratory specimens during the acute phase of infection. The lowest concentration of SARS-CoV-2 viral copies this assay can detect is 250 copies / mL. A negative result does not preclude SARS-CoV-2 infection and should not be used as the sole basis for treatment or other patient management decisions.   A negative result may occur with improper specimen collection / handling, submission of specimen other than nasopharyngeal swab, presence of viral mutation(s) within the areas targeted by this assay, and inadequate number of viral copies (<250 copies / mL). A negative result must be combined with clinical observations, patient history, and epidemiological information. Fact Sheet for Patients:   StrictlyIdeas.no Fact Sheet for Healthcare Providers: BankingDealers.co.za This test is not yet approved or cleared  by the [place] FDA and has been authorized for detection and/or diagnosis of SARS-CoV-2 by FDA under an Emergency Use Authorization (EUA).  This EUA will remain in effect (meaning this test can be used) for the duration of the COVID-19 declaration under Section 564(b)(1) of the Act, 21 U.S.C. section 360bbb-3(b)(1), unless the authorization is terminated or revoked sooner. Performed at [facility], [place] [address] [place], [place] [address]      Labs: BNP (last 3 results) Recent Labs    03/05/20 0416  BNP 17.4   Basic Metabolic Panel: Recent Labs  Lab 03/05/20 0416 03/05/20 0425 03/06/20 0207  NA 143 143 142  K 5.1 5.0 4.4  CL 113* 113* 109  CO2 19*  --  22  GLUCOSE 149* 146* 132*  BUN 104* 98* 96*  CREATININE 3.12* 3.40* 2.97*  CALCIUM 8.3*  --  8.6*  MG 2.4  --   --    Liver Function Tests: Recent Labs  Lab 03/05/20 0416  AST 27  ALT 34  ALKPHOS 81  BILITOT 0.5  PROT 7.4  ALBUMIN 3.7   No results for input(s): LIPASE, AMYLASE in the last 168 hours. No results for input(s): AMMONIA in the last 168 hours. CBC: Recent Labs  Lab 03/05/20 0416 03/05/20 0425 03/06/20 0207  WBC 7.3  --  5.7  NEUTROABS  --   --  3.1  HGB 9.8* 10.5* 9.3*  HCT 32.8* 31.0* 30.3*  MCV 88.9  --  87.3  PLT 282  --  271   Cardiac Enzymes: No results for input(s): CKTOTAL, CKMB, CKMBINDEX, TROPONINI in the last  168 hours. BNP: Invalid input(s): POCBNP CBG: Recent Labs  Lab 03/05/20 1609 03/05/20 1848 03/05/20 2129 03/06/20 0600 03/06/20 1113  GLUCAP 232* 210* 170* 119* 103*   D-Dimer No results for input(s): DDIMER in the last 72 hours. Hgb A1c Recent Labs    03/05/20 1540  HGBA1C 7.8*   Lipid Profile No results for input(s): CHOL, HDL, LDLCALC, TRIG, CHOLHDL, LDLDIRECT in the last 72 hours. Thyroid function studies No results for input(s): TSH, T4TOTAL, T3FREE, THYROIDAB in the last 72 hours.  Invalid input(s): FREET3 Anemia work up No results for input(s): VITAMINB12, FOLATE, FERRITIN, TIBC, IRON, RETICCTPCT in the last 72 hours. Urinalysis    Component Value Date/Time   COLORURINE COLORLESS (A) 03/05/2020 1454   APPEARANCEUR CLEAR 03/05/2020 1454   LABSPEC 1.008 03/05/2020 1454   PHURINE 5.0 03/05/2020 1454   GLUCOSEU NEGATIVE 03/05/2020 1454   HGBUR SMALL (A) 03/05/2020 1454   BILIRUBINUR NEGATIVE 03/05/2020 1454   KETONESUR NEGATIVE 03/05/2020 1454   PROTEINUR 30 (A) 03/05/2020 1454   UROBILINOGEN 0.2 02/14/2012 1639   NITRITE NEGATIVE 03/05/2020 1454   LEUKOCYTESUR NEGATIVE 03/05/2020 1454   Sepsis Labs Invalid input(s): PROCALCITONIN,  WBC,  LACTICIDVEN Microbiology Recent Results (from the past 240 hour(s))  SARS Coronavirus 2 by RT PCR (hospital order, performed in [place] hospital lab) Nasopharyngeal Nasopharyngeal Swab     Status: None   Collection Time: 03/05/20  7:43 AM   Specimen: Nasopharyngeal Swab  Result Value Ref Range Status   SARS Coronavirus 2 NEGATIVE NEGATIVE Final    Comment: (NOTE) SARS-CoV-2 target nucleic acids are NOT DETECTED. The SARS-CoV-2 RNA is generally detectable in upper and lower respiratory specimens during the acute phase of infection. The lowest concentration of SARS-CoV-2 viral copies this assay can detect is 250 copies / mL. A negative result does not preclude SARS-CoV-2 infection and should not be used as the sole  basis for treatment or other patient management decisions.  A negative result may occur with improper specimen collection / handling, submission of specimen other than nasopharyngeal swab, presence of viral mutation(s) within the areas targeted by this assay, and inadequate number of viral copies (<250 copies / mL). A negative result must be combined with clinical observations, patient history, and epidemiological information. Fact Sheet for Patients:   StrictlyIdeas.no Fact Sheet for Healthcare Providers: BankingDealers.co.za This test is not yet approved or cleared  by the [place] FDA and has been authorized for detection and/or diagnosis of SARS-CoV-2 by FDA under an Emergency Use Authorization (EUA).  This EUA will remain in effect (meaning this test can be used) for the duration of the COVID-19 declaration under Section 564(b)(1) of the Act, 21 U.S.C. section 360bbb-3(b)(1), unless the authorization is terminated or revoked sooner. Performed at [facility], [place] [address] [place], [place] [address]      Time coordinating discharge: Over 30 minutes  SIGNED:   Darliss Cheney, MD  [facility] 03/06/2020, 12:01 PM  If 7PM-7AM, please contact night-coverage [email or web address]

## 2020-03-06 NOTE — Progress Notes (Signed)
  Echocardiogram 2D Echocardiogram has been performed.  Randa Lynn Dance 03/06/2020, 10:36 AM

## 2020-03-06 NOTE — Discharge Instructions (Signed)

## 2020-03-06 NOTE — Progress Notes (Signed)
PT Cancellation Note  Patient Details Name: HIAWATHA DRESSEL MRN: [id number] DOB: Feb 10, 1956   Cancelled Treatment:    Reason Eval/Treat Not Completed: Other (comment) Pt currently taking a bath and requesting to hold on PT. Will follow up as schedule allows.   Lou Miner, DPT  Acute Rehabilitation Services  Pager: [phone] Office: [phone]    Rudean Hitt 03/06/2020, 9:35 AM

## 2020-03-06 NOTE — TOC Transition Note (Signed)
Transition of Care [facility]) - CM/SW Discharge Note   Patient Details  Name: Latasha [name] MRN: [id number] Date of Birth: 1956-09-29  Transition of Care [facility]) CM/SW Contact:  Marilu Favre, RN Phone Number: 03/06/2020, 12:31 PM   Clinical Narrative:     Patient from home with husband.   HHPT arranged with [facility]   Final next level of care: [place] Barriers to Discharge: No Barriers Identified   Patient Goals and CMS Choice Patient states their goals for this hospitalization and ongoing recovery are:: to return to home CMS Medicare.gov Compare Post Acute Care list provided to:: Patient Choice offered to / list presented to : Patient  Discharge Placement                       Discharge Plan and Services   Discharge Planning Services: CM Consult Post Acute Care Choice: Home Health            DME Agency: NA       HH Arranged: PT HH Agency: [place] Date [place]: 03/06/20 Time HH Agency Contacted: [id number] Representative spoke with at [place]: [place] ([place]) Interventions     Readmission Risk Interventions No flowsheet data found.

## 2020-03-07 LAB — UREA NITROGEN, URINE: Urea Nitrogen, Ur: 357 mg/dL

## 2020-03-09 DIAGNOSIS — I129 Hypertensive chronic kidney disease with stage 1 through stage 4 chronic kidney disease, or unspecified chronic kidney disease: Secondary | ICD-10-CM | POA: Diagnosis not present

## 2020-03-09 DIAGNOSIS — E1169 Type 2 diabetes mellitus with other specified complication: Secondary | ICD-10-CM | POA: Diagnosis not present

## 2020-03-09 DIAGNOSIS — E1122 Type 2 diabetes mellitus with diabetic chronic kidney disease: Secondary | ICD-10-CM | POA: Diagnosis not present

## 2020-03-09 DIAGNOSIS — E1165 Type 2 diabetes mellitus with hyperglycemia: Secondary | ICD-10-CM | POA: Diagnosis not present

## 2020-03-09 DIAGNOSIS — E1149 Type 2 diabetes mellitus with other diabetic neurological complication: Secondary | ICD-10-CM | POA: Diagnosis not present

## 2020-03-09 DIAGNOSIS — E785 Hyperlipidemia, unspecified: Secondary | ICD-10-CM | POA: Diagnosis not present

## 2020-03-09 DIAGNOSIS — Z794 Long term (current) use of insulin: Secondary | ICD-10-CM | POA: Diagnosis not present

## 2020-03-09 DIAGNOSIS — N184 Chronic kidney disease, stage 4 (severe): Secondary | ICD-10-CM | POA: Diagnosis not present

## 2020-03-10 ENCOUNTER — Encounter (HOSPITAL_COMMUNITY)
Admission: RE | Admit: 2020-03-10 | Discharge: 2020-03-10 | Disposition: A | Discharge status: Home / Self Care | Payer: Medicare HMO | Source: Ambulatory Visit | Attending: Internal Medicine | Admitting: Internal Medicine

## 2020-03-10 DIAGNOSIS — D631 Anemia in chronic kidney disease: Secondary | ICD-10-CM | POA: Insufficient documentation

## 2020-03-10 DIAGNOSIS — N189 Chronic kidney disease, unspecified: Secondary | ICD-10-CM | POA: Insufficient documentation

## 2020-03-10 MED ORDER — SODIUM CHLORIDE 0.9 % IV SOLN
510.0000 mg | Freq: Once | INTRAVENOUS | Status: AC
  Administered 2020-03-10: 510 mg via INTRAVENOUS
  Filled 2020-03-10: qty 510

## 2020-03-12 DIAGNOSIS — I130 Hypertensive heart and chronic kidney disease with heart failure and stage 1 through stage 4 chronic kidney disease, or unspecified chronic kidney disease: Secondary | ICD-10-CM | POA: Diagnosis not present

## 2020-03-12 DIAGNOSIS — J9601 Acute respiratory failure with hypoxia: Secondary | ICD-10-CM | POA: Diagnosis not present

## 2020-03-12 DIAGNOSIS — E785 Hyperlipidemia, unspecified: Secondary | ICD-10-CM | POA: Diagnosis not present

## 2020-03-12 DIAGNOSIS — H8110 Benign paroxysmal vertigo, unspecified ear: Secondary | ICD-10-CM | POA: Diagnosis not present

## 2020-03-12 DIAGNOSIS — I5033 Acute on chronic diastolic (congestive) heart failure: Secondary | ICD-10-CM | POA: Diagnosis not present

## 2020-03-12 DIAGNOSIS — N185 Chronic kidney disease, stage 5: Secondary | ICD-10-CM | POA: Diagnosis not present

## 2020-03-12 DIAGNOSIS — I132 Hypertensive heart and chronic kidney disease with heart failure and with stage 5 chronic kidney disease, or end stage renal disease: Secondary | ICD-10-CM | POA: Diagnosis not present

## 2020-03-12 DIAGNOSIS — N179 Acute kidney failure, unspecified: Secondary | ICD-10-CM | POA: Diagnosis not present

## 2020-03-12 DIAGNOSIS — E1122 Type 2 diabetes mellitus with diabetic chronic kidney disease: Secondary | ICD-10-CM | POA: Diagnosis not present

## 2020-03-14 DIAGNOSIS — E785 Hyperlipidemia, unspecified: Secondary | ICD-10-CM | POA: Diagnosis not present

## 2020-03-14 DIAGNOSIS — I132 Hypertensive heart and chronic kidney disease with heart failure and with stage 5 chronic kidney disease, or end stage renal disease: Secondary | ICD-10-CM | POA: Diagnosis not present

## 2020-03-14 DIAGNOSIS — N179 Acute kidney failure, unspecified: Secondary | ICD-10-CM | POA: Diagnosis not present

## 2020-03-14 DIAGNOSIS — I5033 Acute on chronic diastolic (congestive) heart failure: Secondary | ICD-10-CM | POA: Diagnosis not present

## 2020-03-14 DIAGNOSIS — H8110 Benign paroxysmal vertigo, unspecified ear: Secondary | ICD-10-CM | POA: Diagnosis not present

## 2020-03-14 DIAGNOSIS — J9601 Acute respiratory failure with hypoxia: Secondary | ICD-10-CM | POA: Diagnosis not present

## 2020-03-14 DIAGNOSIS — N185 Chronic kidney disease, stage 5: Secondary | ICD-10-CM | POA: Diagnosis not present

## 2020-03-14 DIAGNOSIS — E1122 Type 2 diabetes mellitus with diabetic chronic kidney disease: Secondary | ICD-10-CM | POA: Diagnosis not present

## 2020-03-16 DIAGNOSIS — N179 Acute kidney failure, unspecified: Secondary | ICD-10-CM | POA: Diagnosis not present

## 2020-03-16 DIAGNOSIS — E785 Hyperlipidemia, unspecified: Secondary | ICD-10-CM | POA: Diagnosis not present

## 2020-03-16 DIAGNOSIS — H8110 Benign paroxysmal vertigo, unspecified ear: Secondary | ICD-10-CM | POA: Diagnosis not present

## 2020-03-16 DIAGNOSIS — I5033 Acute on chronic diastolic (congestive) heart failure: Secondary | ICD-10-CM | POA: Diagnosis not present

## 2020-03-16 DIAGNOSIS — J9601 Acute respiratory failure with hypoxia: Secondary | ICD-10-CM | POA: Diagnosis not present

## 2020-03-16 DIAGNOSIS — E1122 Type 2 diabetes mellitus with diabetic chronic kidney disease: Secondary | ICD-10-CM | POA: Diagnosis not present

## 2020-03-16 DIAGNOSIS — I132 Hypertensive heart and chronic kidney disease with heart failure and with stage 5 chronic kidney disease, or end stage renal disease: Secondary | ICD-10-CM | POA: Diagnosis not present

## 2020-03-16 DIAGNOSIS — N185 Chronic kidney disease, stage 5: Secondary | ICD-10-CM | POA: Diagnosis not present

## 2020-03-21 DIAGNOSIS — N179 Acute kidney failure, unspecified: Secondary | ICD-10-CM | POA: Diagnosis not present

## 2020-03-21 DIAGNOSIS — I5033 Acute on chronic diastolic (congestive) heart failure: Secondary | ICD-10-CM | POA: Diagnosis not present

## 2020-03-21 DIAGNOSIS — I132 Hypertensive heart and chronic kidney disease with heart failure and with stage 5 chronic kidney disease, or end stage renal disease: Secondary | ICD-10-CM | POA: Diagnosis not present

## 2020-03-21 DIAGNOSIS — N185 Chronic kidney disease, stage 5: Secondary | ICD-10-CM | POA: Diagnosis not present

## 2020-03-21 DIAGNOSIS — E1122 Type 2 diabetes mellitus with diabetic chronic kidney disease: Secondary | ICD-10-CM | POA: Diagnosis not present

## 2020-03-21 DIAGNOSIS — E785 Hyperlipidemia, unspecified: Secondary | ICD-10-CM | POA: Diagnosis not present

## 2020-03-21 DIAGNOSIS — J9601 Acute respiratory failure with hypoxia: Secondary | ICD-10-CM | POA: Diagnosis not present

## 2020-03-21 DIAGNOSIS — H8110 Benign paroxysmal vertigo, unspecified ear: Secondary | ICD-10-CM | POA: Diagnosis not present

## 2020-03-22 DIAGNOSIS — D649 Anemia, unspecified: Secondary | ICD-10-CM | POA: Diagnosis not present

## 2020-03-22 DIAGNOSIS — N184 Chronic kidney disease, stage 4 (severe): Secondary | ICD-10-CM | POA: Diagnosis not present

## 2020-03-22 DIAGNOSIS — R252 Cramp and spasm: Secondary | ICD-10-CM | POA: Diagnosis not present

## 2020-03-22 DIAGNOSIS — E1122 Type 2 diabetes mellitus with diabetic chronic kidney disease: Secondary | ICD-10-CM | POA: Diagnosis not present

## 2020-03-22 DIAGNOSIS — Z6841 Body Mass Index (BMI) 40.0 and over, adult: Secondary | ICD-10-CM | POA: Diagnosis not present

## 2020-03-22 DIAGNOSIS — I5030 Unspecified diastolic (congestive) heart failure: Secondary | ICD-10-CM | POA: Diagnosis not present

## 2020-03-22 DIAGNOSIS — I129 Hypertensive chronic kidney disease with stage 1 through stage 4 chronic kidney disease, or unspecified chronic kidney disease: Secondary | ICD-10-CM | POA: Diagnosis not present

## 2020-03-28 DIAGNOSIS — J9601 Acute respiratory failure with hypoxia: Secondary | ICD-10-CM | POA: Diagnosis not present

## 2020-03-28 DIAGNOSIS — E1122 Type 2 diabetes mellitus with diabetic chronic kidney disease: Secondary | ICD-10-CM | POA: Diagnosis not present

## 2020-03-28 DIAGNOSIS — I5033 Acute on chronic diastolic (congestive) heart failure: Secondary | ICD-10-CM | POA: Diagnosis not present

## 2020-03-28 DIAGNOSIS — H8110 Benign paroxysmal vertigo, unspecified ear: Secondary | ICD-10-CM | POA: Diagnosis not present

## 2020-03-28 DIAGNOSIS — N185 Chronic kidney disease, stage 5: Secondary | ICD-10-CM | POA: Diagnosis not present

## 2020-03-28 DIAGNOSIS — I132 Hypertensive heart and chronic kidney disease with heart failure and with stage 5 chronic kidney disease, or end stage renal disease: Secondary | ICD-10-CM | POA: Diagnosis not present

## 2020-03-28 DIAGNOSIS — N179 Acute kidney failure, unspecified: Secondary | ICD-10-CM | POA: Diagnosis not present

## 2020-03-28 DIAGNOSIS — E785 Hyperlipidemia, unspecified: Secondary | ICD-10-CM | POA: Diagnosis not present

## 2020-03-29 DIAGNOSIS — E1169 Type 2 diabetes mellitus with other specified complication: Secondary | ICD-10-CM | POA: Diagnosis not present

## 2020-03-29 DIAGNOSIS — E11319 Type 2 diabetes mellitus with unspecified diabetic retinopathy without macular edema: Secondary | ICD-10-CM | POA: Diagnosis not present

## 2020-03-29 DIAGNOSIS — E785 Hyperlipidemia, unspecified: Secondary | ICD-10-CM | POA: Diagnosis not present

## 2020-03-29 DIAGNOSIS — E1149 Type 2 diabetes mellitus with other diabetic neurological complication: Secondary | ICD-10-CM | POA: Diagnosis not present

## 2020-03-29 DIAGNOSIS — N184 Chronic kidney disease, stage 4 (severe): Secondary | ICD-10-CM | POA: Diagnosis not present

## 2020-03-29 DIAGNOSIS — I129 Hypertensive chronic kidney disease with stage 1 through stage 4 chronic kidney disease, or unspecified chronic kidney disease: Secondary | ICD-10-CM | POA: Diagnosis not present

## 2020-03-29 DIAGNOSIS — E1165 Type 2 diabetes mellitus with hyperglycemia: Secondary | ICD-10-CM | POA: Diagnosis not present

## 2020-03-29 DIAGNOSIS — Z794 Long term (current) use of insulin: Secondary | ICD-10-CM | POA: Diagnosis not present

## 2020-03-29 DIAGNOSIS — E1122 Type 2 diabetes mellitus with diabetic chronic kidney disease: Secondary | ICD-10-CM | POA: Diagnosis not present

## 2020-04-11 DIAGNOSIS — Z794 Long term (current) use of insulin: Secondary | ICD-10-CM | POA: Diagnosis not present

## 2020-04-11 DIAGNOSIS — J9601 Acute respiratory failure with hypoxia: Secondary | ICD-10-CM | POA: Diagnosis not present

## 2020-04-11 DIAGNOSIS — I132 Hypertensive heart and chronic kidney disease with heart failure and with stage 5 chronic kidney disease, or end stage renal disease: Secondary | ICD-10-CM | POA: Diagnosis not present

## 2020-04-11 DIAGNOSIS — N185 Chronic kidney disease, stage 5: Secondary | ICD-10-CM | POA: Diagnosis not present

## 2020-04-11 DIAGNOSIS — E1122 Type 2 diabetes mellitus with diabetic chronic kidney disease: Secondary | ICD-10-CM | POA: Diagnosis not present

## 2020-04-11 DIAGNOSIS — N184 Chronic kidney disease, stage 4 (severe): Secondary | ICD-10-CM | POA: Diagnosis not present

## 2020-04-11 DIAGNOSIS — E1149 Type 2 diabetes mellitus with other diabetic neurological complication: Secondary | ICD-10-CM | POA: Diagnosis not present

## 2020-04-11 DIAGNOSIS — E1165 Type 2 diabetes mellitus with hyperglycemia: Secondary | ICD-10-CM | POA: Diagnosis not present

## 2020-04-11 DIAGNOSIS — E785 Hyperlipidemia, unspecified: Secondary | ICD-10-CM | POA: Diagnosis not present

## 2020-04-11 DIAGNOSIS — H8110 Benign paroxysmal vertigo, unspecified ear: Secondary | ICD-10-CM | POA: Diagnosis not present

## 2020-04-11 DIAGNOSIS — N179 Acute kidney failure, unspecified: Secondary | ICD-10-CM | POA: Diagnosis not present

## 2020-04-11 DIAGNOSIS — I5033 Acute on chronic diastolic (congestive) heart failure: Secondary | ICD-10-CM | POA: Diagnosis not present

## 2020-04-12 ENCOUNTER — Ambulatory Visit: Payer: Medicare HMO | Admitting: Cardiovascular Disease

## 2020-04-12 ENCOUNTER — Encounter: Payer: Self-pay | Admitting: Cardiovascular Disease

## 2020-04-12 ENCOUNTER — Other Ambulatory Visit: Payer: Self-pay

## 2020-04-12 DIAGNOSIS — I10 Essential (primary) hypertension: Secondary | ICD-10-CM

## 2020-04-12 DIAGNOSIS — E782 Mixed hyperlipidemia: Secondary | ICD-10-CM

## 2020-04-12 DIAGNOSIS — I5032 Chronic diastolic (congestive) heart failure: Secondary | ICD-10-CM

## 2020-04-12 NOTE — Addendum Note (Signed)
Addended by: Alvina Filbert B on: 04/12/2020 05:38 PM   Modules accepted: Orders

## 2020-04-12 NOTE — Patient Instructions (Signed)
Medication Instructions:  Your physician recommends that you continue on your current medications as directed. Please refer to the Current Medication list given to you today.   *If you need a refill on your cardiac medications before your next appointment, please call your pharmacy*  Lab Work: BMET SOON   Testing/Procedures: NONE   Follow-Up: At [facility], you and your health needs are our priority.  As part of our continuing mission to provide you with exceptional heart care, we have created designated Provider Care Teams.  These Care Teams include your primary Cardiologist (physician) and Advanced Practice Providers (APPs -  Physician Assistants and Nurse Practitioners) who all work together to provide you with the care you need, when you need it.  We recommend signing up for the patient portal called "MyChart".  Sign up information is provided on this After Visit Summary.  MyChart is used to connect with patients for Virtual Visits (Telemedicine).  Patients are able to view lab/test results, encounter notes, upcoming appointments, etc.  Non-urgent messages can be sent to your provider as well.   To learn more about what you can do with MyChart, go to [email or web address].    Your next appointment:   Your physician recommends that you schedule a follow-up appointment in: [place] physician wants you to follow-up in: [place] will receive a reminder letter in the mail two months in advance. If you don't receive a letter, please call our office to schedule the follow-up appointment.

## 2020-04-12 NOTE — Assessment & Plan Note (Signed)
History of essential hypertension blood pressure measured today 127/64.  He is on amlodipine, carvedilol and lisinopril.

## 2020-04-12 NOTE — Progress Notes (Signed)
04/12/2020 Latasha [name]   Feb 25, 1956  [id number]  Primary Physician Shirline Frees, MD Primary Cardiologist: Lorretta Harp MD Lupe Carney, [place]  HPI:  Latasha [name] is a 64 y.o. 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  She was referred by the [facility] emergency room (Dr. Julianne Rice) for evaluation of palpitations. She has a history of hypertension, diabetes and hyperlipidemia. Her father did die at age 36 of a myocardial infarction. She has never had a heart attack or stroke. She denies chest pain but has had some dyspnea on exertion which she attributes increasing weight related to dietary indiscretion and inactivity. She does suffer from chronic vertigo. She stopped ingesting caffeine approximately 3 weeks ago. She does admit to having a lot of stress in her life both at home and at work. On 05/26/16 she was seen in the emergency room with palpitations. Her workup was negative. Interestingly, 2 years ago she was worked up by Dr. Fransico Him who did a 2-D echo that was normal and a Holter monitor that showed PVCs. Her palpitations resolved after 4 days.  I performed diagnostic coronary angiography on her 03/09/2018 after admission for chest pain and abnormal Myoview.  Her cath was clean suggesting false positive stress test.  She was admitted to [facility] 3/55/7322 with diastolic heart failure.  She was diuresed.  Her serum creatinine was elevated and she was discharged home the following day.  A 2D echo performed at the time of hospitalization revealed normal EF.  Since being home she feels clinically improved.  She denies chest pain or shortness of breath.  She does avoid salt.  She is exploring the possibility of bariatric surgery for weight reduction.   Current Meds  Medication Sig  . albuterol (PROVENTIL HFA;VENTOLIN HFA) 108 (90 Base) MCG/ACT  inhaler Inhale 2 puffs into the lungs every 6 (six) hours as needed for wheezing or shortness of breath.  Marland Kitchen amLODipine (NORVASC) 10 MG tablet Take 1 tablet (10 mg total) by mouth daily.  Marland Kitchen atorvastatin (LIPITOR) 40 MG tablet Take 40 mg by mouth daily.   . carvedilol (COREG) 25 MG tablet Take 1 tablet (25 mg total) by mouth 2 (two) times daily with a meal.  . diphenhydrAMINE (BENADRYL) 25 mg capsule Take 1 capsule (25 mg total) by mouth every 6 (six) hours as needed for itching.  . furosemide (LASIX) 80 MG tablet Take 1 tablet (80 mg total) by mouth 2 (two) times daily. (Patient taking differently: Take 80 mg by mouth daily. )  . gabapentin (NEURONTIN) 100 MG capsule Take 100 mg by mouth 3 (three) times daily.   . insulin aspart protamine- aspart (NOVOLOG MIX 70/30) (70-30) 100 UNIT/ML injection Inject 50-60 Units into the skin See admin instructions. Use 60 units every morning and use 50 units every evening  . lisinopril (ZESTRIL) 5 MG tablet Take 5 mg by mouth daily.  . potassium chloride SA (K-DUR,KLOR-CON) 20 MEQ tablet Take 1 tablet (20 mEq total) by mouth daily.  . tizanidine (ZANAFLEX) 2 MG capsule Take 2 mg by mouth daily as needed for muscle spasms.  . traMADol (ULTRAM) 50 MG tablet Take by mouth every 6 (six) hours as needed.     Allergies  Allergen Reactions  . Hydrocodone-Acetaminophen Other (See Comments)  . Oxycodone Hcl Other (See Comments)    Social History   Socioeconomic History  .  Marital status: Married    Spouse name: Not on file  . Number of children: Not on file  . Years of education: Not on file  . Highest education level: Not on file  Occupational History  . Occupation: retired  Tobacco Use  . Smoking status: Never Smoker  . Smokeless tobacco: Never Used  Substance and Sexual Activity  . Alcohol use: Yes    Comment:  rare  . Drug use: No  . Sexual activity: Not on file  Other Topics Concern  . Not on file  Social History Narrative  . Not on file    Social Determinants of Health   Financial Resource Strain:   . Difficulty of Paying Living Expenses:   Food Insecurity:   . Worried About Charity fundraiser in the Last Year:   . Arboriculturist in the Last Year:   Transportation Needs:   . Film/video editor (Medical):   Marland Kitchen Lack of Transportation (Non-Medical):   Physical Activity:   . Days of Exercise per Week:   . Minutes of Exercise per Session:   Stress:   . Feeling of Stress :   Social Connections:   . Frequency of Communication with Friends and Family:   . Frequency of Social Gatherings with Friends and Family:   . Attends Religious Services:   . Active Member of Clubs or Organizations:   . Attends Archivist Meetings:   Marland Kitchen Marital Status:   Intimate Partner Violence:   . Fear of Current or Ex-Partner:   . Emotionally Abused:   Marland Kitchen Physically Abused:   . Sexually Abused:      Review of Systems: General: negative for chills, fever, night sweats or weight changes.  Cardiovascular: negative for chest pain, dyspnea on exertion, edema, orthopnea, palpitations, paroxysmal nocturnal dyspnea or shortness of breath Dermatological: negative for rash Respiratory: negative for cough or wheezing Urologic: negative for hematuria Abdominal: negative for nausea, vomiting, diarrhea, bright red blood per rectum, melena, or hematemesis Neurologic: negative for visual changes, syncope, or dizziness All other systems reviewed and are otherwise negative except as noted above.    Blood pressure 127/64, pulse 61, height 5' 4.5" (1.638 m), weight 256 lb 12.8 oz (116.5 kg), SpO2 98 %.  General appearance: alert and no distress Neck: no adenopathy, no carotid bruit, no JVD, supple, symmetrical, trachea midline and thyroid not enlarged, symmetric, no tenderness/mass/nodules Lungs: clear to auscultation bilaterally Heart: regular rate and rhythm, S1, S2 normal, no murmur, click, rub or gallop Extremities: extremities normal,  atraumatic, no cyanosis or edema Pulses: 2+ and symmetric Skin: Skin color, texture, turgor normal. No rashes or lesions Neurologic: Alert and oriented X 3, normal strength and tone. Normal symmetric reflexes. Normal coordination and gait  EKG not performed today  ASSESSMENT AND PLAN:   Essential hypertension History of essential hypertension blood pressure measured today 127/64.  He is on amlodipine, carvedilol and lisinopril.  Chronic diastolic CHF (congestive heart failure) ([place]) Recent admission for progressive shortness of breath with 2D echo revealing EF of 65 to 70%.  She was diuresed and had moderate renal insufficiency.  She is on oral diuretics and avoid salt.  She has no peripheral edema and denies chest pain or shortness of breath.  Hyperlipemia History of hyperlipidemia on statin therapy with lipid profile performed 08/20/2019 revealing total cholesterol 115, LDL 49 HDL 36.      Lorretta Harp MD [facility], [facility] 04/12/2020 5:14 PM

## 2020-04-12 NOTE — Assessment & Plan Note (Signed)
History of hyperlipidemia on statin therapy with lipid profile performed 08/20/2019 revealing total cholesterol 115, LDL 49 HDL 36.

## 2020-04-12 NOTE — Assessment & Plan Note (Signed)
Recent admission for progressive shortness of breath with 2D echo revealing EF of 65 to 70%.  She was diuresed and had moderate renal insufficiency.  She is on oral diuretics and avoid salt.  She has no peripheral edema and denies chest pain or shortness of breath.

## 2020-04-14 ENCOUNTER — Telehealth: Payer: Self-pay | Admitting: Cardiovascular Disease

## 2020-04-14 NOTE — Telephone Encounter (Signed)
Called to schedule 3 month follow/up with an app pr Dr. Roxan Hockey mail was full

## 2020-04-19 ENCOUNTER — Encounter: Payer: Self-pay | Admitting: Cardiovascular Disease

## 2020-04-19 NOTE — Telephone Encounter (Signed)
Called patient's home number and cell phone number to schedule 3 month follow up with an APP---voice mail was full for both.  Will mail letter requesting patient to call to schedule

## 2020-04-20 DIAGNOSIS — N185 Chronic kidney disease, stage 5: Secondary | ICD-10-CM | POA: Diagnosis not present

## 2020-04-20 DIAGNOSIS — I10 Essential (primary) hypertension: Secondary | ICD-10-CM | POA: Diagnosis not present

## 2020-04-21 LAB — BASIC METABOLIC PANEL
BUN/Creatinine Ratio: 29 — ABNORMAL HIGH (ref 12–28)
BUN: 86 mg/dL (ref 8–27)
CO2: 21 mmol/L (ref 20–29)
Calcium: 8.8 mg/dL (ref 8.7–10.3)
Chloride: 103 mmol/L (ref 96–106)
Creatinine, Ser: 2.95 mg/dL — ABNORMAL HIGH (ref 0.57–1.00)
GFR calc Af Amer: 19 mL/min/{1.73_m2} — ABNORMAL LOW (ref >59)
GFR calc non Af Amer: 16 mL/min/{1.73_m2} — ABNORMAL LOW (ref >59)
Glucose: 129 mg/dL — ABNORMAL HIGH (ref 65–99)
Potassium: 5.2 mmol/L (ref 3.5–5.2)
Sodium: 139 mmol/L (ref 134–144)

## 2020-04-25 ENCOUNTER — Telehealth: Payer: Self-pay

## 2020-04-25 NOTE — Telephone Encounter (Addendum)
Tried calling patient on both her home and mobile numbers I could not leave a voice message for either number. Mailed out letter with comments to patient.    ----- Message from Lorretta Harp, MD sent at 04/23/2020  2:40 PM EDT ----- Renal FXN stable. The remainder of labs are unremarkable

## 2020-05-19 DIAGNOSIS — R404 Transient alteration of awareness: Secondary | ICD-10-CM | POA: Diagnosis not present

## 2020-05-19 DIAGNOSIS — R231 Pallor: Secondary | ICD-10-CM | POA: Diagnosis not present

## 2020-05-19 DIAGNOSIS — R001 Bradycardia, unspecified: Secondary | ICD-10-CM | POA: Diagnosis not present

## 2020-05-19 DIAGNOSIS — E162 Hypoglycemia, unspecified: Secondary | ICD-10-CM | POA: Diagnosis not present

## 2020-05-19 DIAGNOSIS — E161 Other hypoglycemia: Secondary | ICD-10-CM | POA: Diagnosis not present

## 2020-05-26 DIAGNOSIS — R0989 Other specified symptoms and signs involving the circulatory and respiratory systems: Secondary | ICD-10-CM | POA: Diagnosis not present

## 2020-05-26 DIAGNOSIS — I5030 Unspecified diastolic (congestive) heart failure: Secondary | ICD-10-CM | POA: Diagnosis not present

## 2020-05-26 DIAGNOSIS — N184 Chronic kidney disease, stage 4 (severe): Secondary | ICD-10-CM | POA: Diagnosis not present

## 2020-05-26 DIAGNOSIS — E11319 Type 2 diabetes mellitus with unspecified diabetic retinopathy without macular edema: Secondary | ICD-10-CM | POA: Diagnosis not present

## 2020-05-26 DIAGNOSIS — R600 Localized edema: Secondary | ICD-10-CM | POA: Diagnosis not present

## 2020-05-26 DIAGNOSIS — E1140 Type 2 diabetes mellitus with diabetic neuropathy, unspecified: Secondary | ICD-10-CM | POA: Diagnosis not present

## 2020-05-26 DIAGNOSIS — E7800 Pure hypercholesterolemia, unspecified: Secondary | ICD-10-CM | POA: Diagnosis not present

## 2020-05-26 DIAGNOSIS — I10 Essential (primary) hypertension: Secondary | ICD-10-CM | POA: Diagnosis not present

## 2020-05-26 DIAGNOSIS — E1165 Type 2 diabetes mellitus with hyperglycemia: Secondary | ICD-10-CM | POA: Diagnosis not present

## 2020-06-09 DIAGNOSIS — R350 Frequency of micturition: Secondary | ICD-10-CM | POA: Diagnosis not present

## 2020-06-09 DIAGNOSIS — I5030 Unspecified diastolic (congestive) heart failure: Secondary | ICD-10-CM | POA: Diagnosis not present

## 2020-06-09 DIAGNOSIS — N184 Chronic kidney disease, stage 4 (severe): Secondary | ICD-10-CM | POA: Diagnosis not present

## 2020-06-09 DIAGNOSIS — D649 Anemia, unspecified: Secondary | ICD-10-CM | POA: Diagnosis not present

## 2020-06-09 DIAGNOSIS — I129 Hypertensive chronic kidney disease with stage 1 through stage 4 chronic kidney disease, or unspecified chronic kidney disease: Secondary | ICD-10-CM | POA: Diagnosis not present

## 2020-06-09 DIAGNOSIS — E1122 Type 2 diabetes mellitus with diabetic chronic kidney disease: Secondary | ICD-10-CM | POA: Diagnosis not present

## 2020-06-09 DIAGNOSIS — Z6841 Body Mass Index (BMI) 40.0 and over, adult: Secondary | ICD-10-CM | POA: Diagnosis not present

## 2020-06-21 DIAGNOSIS — E113513 Type 2 diabetes mellitus with proliferative diabetic retinopathy with macular edema, bilateral: Secondary | ICD-10-CM | POA: Diagnosis not present

## 2020-06-21 DIAGNOSIS — H4311 Vitreous hemorrhage, right eye: Secondary | ICD-10-CM | POA: Diagnosis not present

## 2020-06-29 DIAGNOSIS — H4311 Vitreous hemorrhage, right eye: Secondary | ICD-10-CM | POA: Diagnosis not present

## 2020-06-29 DIAGNOSIS — E113511 Type 2 diabetes mellitus with proliferative diabetic retinopathy with macular edema, right eye: Secondary | ICD-10-CM | POA: Diagnosis not present

## 2020-07-13 ENCOUNTER — Encounter: Payer: Self-pay | Admitting: Cardiology

## 2020-07-14 DIAGNOSIS — E113513 Type 2 diabetes mellitus with proliferative diabetic retinopathy with macular edema, bilateral: Secondary | ICD-10-CM | POA: Diagnosis not present

## 2020-07-14 DIAGNOSIS — H4311 Vitreous hemorrhage, right eye: Secondary | ICD-10-CM | POA: Diagnosis not present

## 2020-07-24 NOTE — Progress Notes (Signed)
Virtual Visit via Telephone Note   This visit type was conducted due to national recommendations for restrictions regarding the COVID-19 Pandemic (e.g. social distancing) in an effort to limit this patient's exposure and mitigate transmission in our community.  Due to her co-morbid illnesses, this patient is at least at moderate risk for complications without adequate follow up.  This format is felt to be most appropriate for this patient at this time.  The patient did not have access to video technology/had technical difficulties with video requiring transitioning to audio format only (telephone).  All issues noted in this document were discussed and addressed.  No physical exam could be performed with this format.  Please refer to the patient's chart for her  consent to telehealth for [facility].  Evaluation Performed:  Follow-up visit  This visit type was conducted due to national recommendations for restrictions regarding the COVID-19 Pandemic (e.g. social distancing).  This format is felt to be most appropriate for this patient at this time.  All issues noted in this document were discussed and addressed.  No physical exam was performed (except for noted visual exam findings with Video Visits).  Please refer to the patient's chart (MyChart message for video visits and phone note for telephone visits) for the patient's consent to telehealth for [facility]  Date:  07/25/2020   ID:  Latasha [name], DOB [birth year], MRN [id number]  Patient Location:  [address] [place] [address]   Provider location:     [address] [place] [address] Office [phone] Fax [phone]   PCP:  Shirline Frees, MD  Cardiologist:  Quay Burow, MD  Electrophysiologist:  None   Chief Complaint: Follow-up for diastolic CHF  History of Present Illness:    Latasha [name] is a 64 y.o. female who presents via audio/video conferencing  for a telehealth visit today.  Patient verified DOB and address.  Latasha [name] is a PMH of HTN, chronic diastolic CHF, hyperlipidemia, diabetes and obesity.  She was last seen by Dr. [name] on 04/12/2020.  She had been referred to the [facility] emergency room for an evaluation of her palpitations.  She has a family history of MI with her father dying at age 37's.  During her visit she denied chest pain but did note some dyspnea with exertion.  This was attributed to increased weight which was related to dietary indiscretion and sedentary lifestyle.  She was noted to suffer from chronic vertigo.  She had stopped drinking caffeinated beverages.  She indicated that she had increased stress with both life and work.  She presented to the emergency room on 05/27/2016 with palpitations.  Her work-up was negative.  Of note 2 years prior Dr. Radford Pax had also done a 2D echo which showed normal results and Holter monitor that showed PVCs.  She noted that her palpitations resolved after about 4 days.  She underwent diagnostic cardiac catheterization 03/09/2018 after admission for chest pain and abnormal Myoview.  Her cath was clean.    She was admitted to the hospital 1/82/9937 with diastolic CHF.  She was diuresed.  Her serum creatinine was elevated she was discharged home after 1 night.  Her echocardiogram showed normal EF.  On follow-up she was feeling much better.  She denied chest pain and shortness of breath.  She was avoiding salty foods and was investigating the possibility of bariatric surgery.  She is seen today virtually and states she feels well.  She states that she continues to be physically active using her exercise bike 2 times per week for 10 to 12 minutes of the time.  She also walks inside her house several times per day.  She has not noticed any more episodes of palpitations.  Her main complaint today is her back pain which she feels limits her physical activity.  She does note some increased shortness of  breath with increased physical activity.  However she does not notice increased work of breathing with normal daily activities.  She does sleep elevated on pillows due to her vertigo.  She does state that she occasionally has increased lower extremity edema.  She may take an extra dose of furosemide with a weight increase of 3 pounds overnight or 5 pounds in 1 week.  Overall she feels well.  I will give her the salty 6 diet sheet, have her increase her physical activity as tolerated, have her continue to monitor her blood pressure, and follow-up with Dr. [name] in 6 months.  Today she denies chest pain, shortness of breath, lower extremity edema, fatigue, palpitations, melena, hematuria, hemoptysis, diaphoresis, weakness, presyncope, syncope, orthopnea, and PND.   The patient does not symptoms concerning for COVID-19 infection (fever, chills, cough, or new SHORTNESS OF BREATH).    Prior CV studies:   The following studies were reviewed today:  Echocardiogram 03/06/2020 IMPRESSIONS    1. Left ventricular ejection fraction, by estimation, is 65 to 70%. The  left ventricle has normal function. The left ventricle has no regional  wall motion abnormalities. There is severe left ventricular hypertrophy.  Left ventricular diastolic parameters  are consistent with Grade I diastolic dysfunction (impaired relaxation).  2. Right ventricular systolic function is normal. The right ventricular  size is normal. Tricuspid regurgitation signal is inadequate for assessing  PA pressure.  3. The mitral valve is normal in structure. Trivial mitral valve  regurgitation. No evidence of mitral stenosis.  4. The aortic valve is tricuspid. Aortic valve regurgitation is not  visualized. No aortic stenosis is present.   Conclusion(s)/Recommendation(s): Normal coronary artery origins.  Past Medical History:  Diagnosis Date  . BPPV (benign paroxysmal positional vertigo)   . Chronic congestive heart failure  with left ventricular diastolic dysfunction ([place])   . CKD (chronic kidney disease), stage IV ([place]) 05/15/2017  . Diabetes mellitus   . Hyperlipidemia   . Hypertension   . Left atrial enlargement   . Morbid obesity with BMI of 40.0-44.9, adult ([place])   . Palpitations    Past Surgical History:  Procedure Laterality Date  . ABDOMINAL HYSTERECTOMY    . EYE SURGERY     cataract/left eye, right eye had bleeding in back  . KNEE SURGERY    . LEFT HEART CATH AND CORONARY ANGIOGRAPHY N/A 03/09/2018   Procedure: LEFT HEART CATH AND CORONARY ANGIOGRAPHY;  Surgeon: Lorretta Harp, MD;  Location: [facility];  Service: Cardiovascular;  Laterality: N/A;  . TRIGGER FINGER RELEASE       Current Meds  Medication Sig  . amLODipine (NORVASC) 10 MG tablet Take 1 tablet (10 mg total) by mouth daily.  Marland Kitchen atorvastatin (LIPITOR) 40 MG tablet Take 40 mg by mouth daily.   . carvedilol (COREG) 25 MG tablet Take 1 tablet (25 mg total) by mouth 2 (two) times daily with a meal.  . diphenhydrAMINE (BENADRYL) 25 mg capsule Take 1 capsule (25 mg total) by mouth every 6 (six) hours as needed for itching.  . furosemide (LASIX)  80 MG tablet Take 1 tablet (80 mg total) by mouth 2 (two) times daily. (Patient taking differently: Take 80 mg by mouth daily. )  . gabapentin (NEURONTIN) 100 MG capsule Take 100 mg by mouth 3 (three) times daily.   . insulin aspart protamine- aspart (NOVOLOG MIX 70/30) (70-30) 100 UNIT/ML injection Inject 50-60 Units into the skin See admin instructions. Use 60 units every morning and use 50 units every evening  . lisinopril (ZESTRIL) 5 MG tablet Take 5 mg by mouth daily.  . nitroGLYCERIN (NITROSTAT) 0.4 MG SL tablet Place 1 tablet (0.4 mg total) under the tongue every 5 (five) minutes as needed for chest pain.  . potassium chloride SA (K-DUR,KLOR-CON) 20 MEQ tablet Take 1 tablet (20 mEq total) by mouth daily.     Allergies:   Hydrocodone-acetaminophen and Oxycodone hcl   Social History    Tobacco Use  . Smoking status: Never Smoker  . Smokeless tobacco: Never Used  Substance Use Topics  . Alcohol use: Yes    Comment:  rare  . Drug use: No     Family Hx: The patient's family history includes Alzheimer's disease in her mother; Dementia in an other family member; Diabetes in her mother and sister; Heart disease in her father; Hypertension in her father.  ROS:   Please see the history of present illness.     All other systems reviewed and are negative.   Labs/Other Tests and Data Reviewed:    Recent Labs: 03/05/2020: ALT 34; B Natriuretic Peptide 66.5; Magnesium 2.4 03/06/2020: Hemoglobin 9.3; Platelets 271 04/20/2020: BUN 86; Creatinine, Ser 2.95; Potassium 5.2; Sodium 139   Recent Lipid Panel Lab Results  Component Value Date/Time   CHOL 102 03/09/2018 04:03 AM   TRIG 117 03/09/2018 04:03 AM   HDL 26 (L) 03/09/2018 04:03 AM   CHOLHDL 3.9 03/09/2018 04:03 AM   LDLCALC 53 03/09/2018 04:03 AM    Wt Readings from Last 3 Encounters:  07/25/20 252 lb (114.3 kg)  04/12/20 256 lb 12.8 oz (116.5 kg)  03/06/20 257 lb 8 oz (116.8 kg)     Exam:    Vital Signs:  BP 123/68   Pulse 62   Wt 252 lb (114.3 kg)   BMI 42.59 kg/m    Well nourished, well developed female in no  acute distress.   ASSESSMENT & PLAN:    1.  Chronic diastolic CHF-no increased DOE or activity intolerance today.  Had hospital admission 03/05/2020 for acute on chronic diastolic CHF, received IV diuresis.  Repeat echocardiogram showed normal EF. Continue furosemide, carvedilol, lisinopril, potassium Daily weights-May take an extra dose of furosemide  with a weight increase of 3 pounds overnight or 5 pounds in 1 week. Heart healthy low-sodium diet-salty 6 given Increase physical activity as tolerated  Essential hypertension-BP today 123/68.  Well-controlled at home. Continue amlodipine, carvedilol, lisinopril, furosemide Heart healthy low-sodium diet-salty 6 given Increase physical  activity as tolerated  Hyperlipidemia-LDL 53 on 03/09/2018.  Patient will send copy of lipid panel from PCP. Heart healthy high-fiber diet Increase physical activity as tolerated  Disposition: Follow-up with Dr. [name] in 6 months.  COVID-19 Education: The signs and symptoms of COVID-19 were discussed with the patient and how to seek care for testing (follow up with PCP or arrange E-visit).  The importance of social distancing was discussed today.  Patient Risk:   After full review of this patients clinical status, I feel that they are at least moderate risk at this time.  Time:  Today, I have spent open minutes with the patient with telehealth technology discussing diet, exercise, medications, and medical history..  I spent greater than 20 minutes reviewing patient's cardiac history, prior cardiac tests, and medications.   Medication Adjustments/Labs and Tests Ordered: Current medicines are reviewed at length with the patient today.  Concerns regarding medicines are outlined above.   Tests Ordered: No orders of the defined types were placed in this encounter.  Medication Changes: No orders of the defined types were placed in this encounter.   Disposition:  in 6 month(s)  Signed, Jossie Ng. Alegandro Macnaughton NP-C    05/11/2019 11:58 AM    [address] Office [phone] Fax [phone]

## 2020-07-25 ENCOUNTER — Encounter: Payer: Self-pay | Admitting: General Practice

## 2020-07-25 ENCOUNTER — Telehealth (INDEPENDENT_AMBULATORY_CARE_PROVIDER_SITE_OTHER): Payer: Medicare HMO | Admitting: General Practice

## 2020-07-25 ENCOUNTER — Telehealth: Payer: Medicare HMO | Admitting: Cardiology

## 2020-07-25 VITALS — BP 123/68 | HR 62 | Wt 252.0 lb

## 2020-07-25 DIAGNOSIS — I5032 Chronic diastolic (congestive) heart failure: Secondary | ICD-10-CM | POA: Diagnosis not present

## 2020-07-25 DIAGNOSIS — I10 Essential (primary) hypertension: Secondary | ICD-10-CM

## 2020-07-25 DIAGNOSIS — E782 Mixed hyperlipidemia: Secondary | ICD-10-CM | POA: Diagnosis not present

## 2020-07-25 NOTE — Patient Instructions (Signed)
Medication Instructions:  The current medical regimen is effective;  continue present plan and medications as directed. Please refer to the Current Medication list given to you today. *If you need a refill on your cardiac medications before your next appointment, please call your pharmacy*  Lab Work: NONE If you have labs (blood work) drawn today and your tests are completely normal, you will receive your results only by:  [facility] (if you have MyChart) OR A paper copy in the mail.  If you have any lab test that is abnormal or we need to change your treatment, we will call you to review the results. You may go to any [facility] that is convenient for you however, we do have a lab in our office that is able to assist you. You DO NOT need an appointment for our lab. The lab is open 8:00am and closes at 4:00pm. Lunch 12:45 - 1:45pm.  Special Instructions CONTINUE DAILY WEIGHTS. OK TO TAKE EXTRA FUROSEMIDE IF YOUR WEIGHT IS >3 LBS IN A DAY -OR- >5 LBS IN A WEEK  PLEASE READ AND FOLLOW SALTY 6-ATTACHED  Follow-Up: Your next appointment:  6 month(s) In Person with Quay Burow, MD -[place], FNP-C Please call our office 2 months in advance to schedule this appointment   At [facility], you and your health needs are our priority.  As part of our continuing mission to provide you with exceptional heart care, we have created designated Provider Care Teams.  These Care Teams include your primary Cardiologist (physician) and Advanced Practice Providers (APPs -  Physician Assistants and Nurse Practitioners) who all work together to provide you with the care you need, when you need it.  We recommend signing up for the patient portal called "MyChart".  Sign up information is provided on this After Visit Summary.  MyChart is used to connect with patients for Virtual Visits (Telemedicine).  Patients are able to view lab/test results, encounter notes, upcoming appointments, etc.  Non-urgent  messages can be sent to your provider as well.   To learn more about what you can do with MyChart, go to [email or web address].

## 2020-09-08 DIAGNOSIS — N184 Chronic kidney disease, stage 4 (severe): Secondary | ICD-10-CM | POA: Diagnosis not present

## 2020-09-08 DIAGNOSIS — I129 Hypertensive chronic kidney disease with stage 1 through stage 4 chronic kidney disease, or unspecified chronic kidney disease: Secondary | ICD-10-CM | POA: Diagnosis not present

## 2020-09-08 DIAGNOSIS — H4312 Vitreous hemorrhage, left eye: Secondary | ICD-10-CM | POA: Diagnosis not present

## 2020-09-08 DIAGNOSIS — I5030 Unspecified diastolic (congestive) heart failure: Secondary | ICD-10-CM | POA: Diagnosis not present

## 2020-09-08 DIAGNOSIS — D649 Anemia, unspecified: Secondary | ICD-10-CM | POA: Diagnosis not present

## 2020-09-08 DIAGNOSIS — E113513 Type 2 diabetes mellitus with proliferative diabetic retinopathy with macular edema, bilateral: Secondary | ICD-10-CM | POA: Diagnosis not present

## 2020-09-08 DIAGNOSIS — E1122 Type 2 diabetes mellitus with diabetic chronic kidney disease: Secondary | ICD-10-CM | POA: Diagnosis not present

## 2020-09-08 DIAGNOSIS — Z6841 Body Mass Index (BMI) 40.0 and over, adult: Secondary | ICD-10-CM | POA: Diagnosis not present

## 2020-10-05 DIAGNOSIS — Z794 Long term (current) use of insulin: Secondary | ICD-10-CM | POA: Diagnosis not present

## 2020-10-05 DIAGNOSIS — E11319 Type 2 diabetes mellitus with unspecified diabetic retinopathy without macular edema: Secondary | ICD-10-CM | POA: Diagnosis not present

## 2020-10-05 DIAGNOSIS — E1149 Type 2 diabetes mellitus with other diabetic neurological complication: Secondary | ICD-10-CM | POA: Diagnosis not present

## 2020-10-05 DIAGNOSIS — R059 Cough, unspecified: Secondary | ICD-10-CM | POA: Diagnosis not present

## 2020-10-05 DIAGNOSIS — E1122 Type 2 diabetes mellitus with diabetic chronic kidney disease: Secondary | ICD-10-CM | POA: Diagnosis not present

## 2020-10-05 DIAGNOSIS — I129 Hypertensive chronic kidney disease with stage 1 through stage 4 chronic kidney disease, or unspecified chronic kidney disease: Secondary | ICD-10-CM | POA: Diagnosis not present

## 2020-10-05 DIAGNOSIS — E785 Hyperlipidemia, unspecified: Secondary | ICD-10-CM | POA: Diagnosis not present

## 2020-10-05 DIAGNOSIS — E1165 Type 2 diabetes mellitus with hyperglycemia: Secondary | ICD-10-CM | POA: Diagnosis not present

## 2020-10-05 DIAGNOSIS — E1169 Type 2 diabetes mellitus with other specified complication: Secondary | ICD-10-CM | POA: Diagnosis not present

## 2020-10-09 ENCOUNTER — Encounter (HOSPITAL_COMMUNITY): Payer: Self-pay | Admitting: Emergency Medicine

## 2020-10-09 ENCOUNTER — Other Ambulatory Visit: Payer: Self-pay

## 2020-10-09 ENCOUNTER — Emergency Department (HOSPITAL_COMMUNITY): Payer: Medicare HMO

## 2020-10-09 DIAGNOSIS — Z5321 Procedure and treatment not carried out due to patient leaving prior to being seen by health care provider: Secondary | ICD-10-CM | POA: Diagnosis not present

## 2020-10-09 DIAGNOSIS — M549 Dorsalgia, unspecified: Secondary | ICD-10-CM | POA: Diagnosis not present

## 2020-10-09 DIAGNOSIS — R42 Dizziness and giddiness: Secondary | ICD-10-CM | POA: Insufficient documentation

## 2020-10-09 DIAGNOSIS — R059 Cough, unspecified: Secondary | ICD-10-CM | POA: Diagnosis not present

## 2020-10-09 DIAGNOSIS — R062 Wheezing: Secondary | ICD-10-CM | POA: Insufficient documentation

## 2020-10-09 DIAGNOSIS — R0602 Shortness of breath: Secondary | ICD-10-CM | POA: Insufficient documentation

## 2020-10-09 NOTE — ED Triage Notes (Addendum)
Patient report cough and wheezing with SOB x1 week. Denies chest pain and fever. Reports taking doxycycline as prescribed by PCP.

## 2020-10-10 ENCOUNTER — Emergency Department (HOSPITAL_COMMUNITY)
Admission: EM | Admit: 2020-10-10 | Discharge: 2020-10-10 | Disposition: A | Discharge status: Home / Self Care | Payer: Medicare HMO | Attending: Emergency Medicine | Admitting: Emergency Medicine

## 2020-10-10 NOTE — ED Notes (Signed)
Pt left without speaking with staff

## 2020-10-22 ENCOUNTER — Inpatient Hospital Stay (HOSPITAL_COMMUNITY)
Admission: EM | Admit: 2020-10-22 | Discharge: 2020-11-01 | DRG: 177 | Disposition: A | Discharge status: Home Health | Payer: Medicare HMO | Attending: Internal Medicine | Admitting: Internal Medicine

## 2020-10-22 ENCOUNTER — Emergency Department (HOSPITAL_COMMUNITY): Payer: Medicare HMO

## 2020-10-22 ENCOUNTER — Other Ambulatory Visit: Payer: Self-pay

## 2020-10-22 ENCOUNTER — Encounter (HOSPITAL_COMMUNITY): Payer: Self-pay | Admitting: Emergency Medicine

## 2020-10-22 DIAGNOSIS — N19 Unspecified kidney failure: Secondary | ICD-10-CM

## 2020-10-22 DIAGNOSIS — I130 Hypertensive heart and chronic kidney disease with heart failure and stage 1 through stage 4 chronic kidney disease, or unspecified chronic kidney disease: Secondary | ICD-10-CM | POA: Diagnosis present

## 2020-10-22 DIAGNOSIS — I5032 Chronic diastolic (congestive) heart failure: Secondary | ICD-10-CM | POA: Diagnosis present

## 2020-10-22 DIAGNOSIS — R5381 Other malaise: Secondary | ICD-10-CM | POA: Diagnosis present

## 2020-10-22 DIAGNOSIS — Z6841 Body Mass Index (BMI) 40.0 and over, adult: Secondary | ICD-10-CM | POA: Diagnosis not present

## 2020-10-22 DIAGNOSIS — Z90710 Acquired absence of both cervix and uterus: Secondary | ICD-10-CM

## 2020-10-22 DIAGNOSIS — E861 Hypovolemia: Secondary | ICD-10-CM | POA: Diagnosis present

## 2020-10-22 DIAGNOSIS — M1711 Unilateral primary osteoarthritis, right knee: Secondary | ICD-10-CM | POA: Diagnosis not present

## 2020-10-22 DIAGNOSIS — G9341 Metabolic encephalopathy: Secondary | ICD-10-CM | POA: Diagnosis not present

## 2020-10-22 DIAGNOSIS — N184 Chronic kidney disease, stage 4 (severe): Secondary | ICD-10-CM | POA: Diagnosis present

## 2020-10-22 DIAGNOSIS — M898X9 Other specified disorders of bone, unspecified site: Secondary | ICD-10-CM | POA: Diagnosis present

## 2020-10-22 DIAGNOSIS — A084 Viral intestinal infection, unspecified: Secondary | ICD-10-CM | POA: Diagnosis present

## 2020-10-22 DIAGNOSIS — J1282 Pneumonia due to coronavirus disease 2019: Secondary | ICD-10-CM | POA: Diagnosis not present

## 2020-10-22 DIAGNOSIS — I959 Hypotension, unspecified: Secondary | ICD-10-CM | POA: Diagnosis not present

## 2020-10-22 DIAGNOSIS — M25561 Pain in right knee: Secondary | ICD-10-CM

## 2020-10-22 DIAGNOSIS — E1122 Type 2 diabetes mellitus with diabetic chronic kidney disease: Secondary | ICD-10-CM | POA: Diagnosis present

## 2020-10-22 DIAGNOSIS — E785 Hyperlipidemia, unspecified: Secondary | ICD-10-CM | POA: Diagnosis present

## 2020-10-22 DIAGNOSIS — R944 Abnormal results of kidney function studies: Secondary | ICD-10-CM | POA: Diagnosis not present

## 2020-10-22 DIAGNOSIS — E872 Acidosis: Secondary | ICD-10-CM | POA: Diagnosis present

## 2020-10-22 DIAGNOSIS — N186 End stage renal disease: Secondary | ICD-10-CM

## 2020-10-22 DIAGNOSIS — R600 Localized edema: Secondary | ICD-10-CM | POA: Diagnosis not present

## 2020-10-22 DIAGNOSIS — D631 Anemia in chronic kidney disease: Secondary | ICD-10-CM | POA: Diagnosis present

## 2020-10-22 DIAGNOSIS — Z833 Family history of diabetes mellitus: Secondary | ICD-10-CM

## 2020-10-22 DIAGNOSIS — J9621 Acute and chronic respiratory failure with hypoxia: Secondary | ICD-10-CM | POA: Diagnosis present

## 2020-10-22 DIAGNOSIS — Z794 Long term (current) use of insulin: Secondary | ICD-10-CM

## 2020-10-22 DIAGNOSIS — Z79891 Long term (current) use of opiate analgesic: Secondary | ICD-10-CM

## 2020-10-22 DIAGNOSIS — Z4901 Encounter for fitting and adjustment of extracorporeal dialysis catheter: Secondary | ICD-10-CM | POA: Diagnosis not present

## 2020-10-22 DIAGNOSIS — F419 Anxiety disorder, unspecified: Secondary | ICD-10-CM | POA: Diagnosis present

## 2020-10-22 DIAGNOSIS — E1165 Type 2 diabetes mellitus with hyperglycemia: Secondary | ICD-10-CM | POA: Diagnosis not present

## 2020-10-22 DIAGNOSIS — K219 Gastro-esophageal reflux disease without esophagitis: Secondary | ICD-10-CM | POA: Diagnosis present

## 2020-10-22 DIAGNOSIS — Z885 Allergy status to narcotic agent status: Secondary | ICD-10-CM

## 2020-10-22 DIAGNOSIS — R0602 Shortness of breath: Secondary | ICD-10-CM | POA: Diagnosis not present

## 2020-10-22 DIAGNOSIS — U071 COVID-19: Secondary | ICD-10-CM | POA: Diagnosis not present

## 2020-10-22 DIAGNOSIS — E875 Hyperkalemia: Secondary | ICD-10-CM | POA: Diagnosis present

## 2020-10-22 DIAGNOSIS — I517 Cardiomegaly: Secondary | ICD-10-CM | POA: Diagnosis not present

## 2020-10-22 DIAGNOSIS — N179 Acute kidney failure, unspecified: Secondary | ICD-10-CM | POA: Diagnosis present

## 2020-10-22 DIAGNOSIS — Z8249 Family history of ischemic heart disease and other diseases of the circulatory system: Secondary | ICD-10-CM

## 2020-10-22 DIAGNOSIS — N200 Calculus of kidney: Secondary | ICD-10-CM | POA: Diagnosis not present

## 2020-10-22 DIAGNOSIS — R0902 Hypoxemia: Secondary | ICD-10-CM | POA: Diagnosis not present

## 2020-10-22 DIAGNOSIS — D509 Iron deficiency anemia, unspecified: Secondary | ICD-10-CM | POA: Diagnosis present

## 2020-10-22 DIAGNOSIS — T380X5A Adverse effect of glucocorticoids and synthetic analogues, initial encounter: Secondary | ICD-10-CM | POA: Diagnosis not present

## 2020-10-22 DIAGNOSIS — R0689 Other abnormalities of breathing: Secondary | ICD-10-CM | POA: Diagnosis not present

## 2020-10-22 DIAGNOSIS — E119 Type 2 diabetes mellitus without complications: Secondary | ICD-10-CM

## 2020-10-22 DIAGNOSIS — J9601 Acute respiratory failure with hypoxia: Secondary | ICD-10-CM | POA: Diagnosis not present

## 2020-10-22 DIAGNOSIS — G8929 Other chronic pain: Secondary | ICD-10-CM | POA: Diagnosis present

## 2020-10-22 DIAGNOSIS — Z79899 Other long term (current) drug therapy: Secondary | ICD-10-CM

## 2020-10-22 DIAGNOSIS — J189 Pneumonia, unspecified organism: Secondary | ICD-10-CM | POA: Diagnosis not present

## 2020-10-22 DIAGNOSIS — I129 Hypertensive chronic kidney disease with stage 1 through stage 4 chronic kidney disease, or unspecified chronic kidney disease: Secondary | ICD-10-CM | POA: Diagnosis not present

## 2020-10-22 LAB — BASIC METABOLIC PANEL
Anion gap: 13 (ref 5–15)
BUN: 79 mg/dL — ABNORMAL HIGH (ref 8–23)
CO2: 18 mmol/L — ABNORMAL LOW (ref 22–32)
Calcium: 7.6 mg/dL — ABNORMAL LOW (ref 8.9–10.3)
Chloride: 106 mmol/L (ref 98–111)
Creatinine, Ser: 3.95 mg/dL — ABNORMAL HIGH (ref 0.44–1.00)
GFR, Estimated: 12 mL/min — ABNORMAL LOW (ref >60)
Glucose, Bld: 197 mg/dL — ABNORMAL HIGH (ref 70–99)
Potassium: 5.2 mmol/L — ABNORMAL HIGH (ref 3.5–5.1)
Sodium: 137 mmol/L (ref 135–145)

## 2020-10-22 LAB — CBC
HCT: 28.6 % — ABNORMAL LOW (ref 36.0–46.0)
Hemoglobin: 9.2 g/dL — ABNORMAL LOW (ref 12.0–15.0)
MCH: 29.4 pg (ref 26.0–34.0)
MCHC: 32.2 g/dL (ref 30.0–36.0)
MCV: 91.4 fL (ref 80.0–100.0)
Platelets: 169 10*3/uL (ref 150–400)
RBC: 3.13 MIL/uL — ABNORMAL LOW (ref 3.87–5.11)
RDW: 14.2 % (ref 11.5–15.5)
WBC: 6.9 10*3/uL (ref 4.0–10.5)
nRBC: 0 % (ref 0.0–0.2)

## 2020-10-22 LAB — URINALYSIS, ROUTINE W REFLEX MICROSCOPIC
Bilirubin Urine: NEGATIVE
Glucose, UA: NEGATIVE mg/dL
Ketones, ur: NEGATIVE mg/dL
Leukocytes,Ua: NEGATIVE
Nitrite: NEGATIVE
Protein, ur: 100 mg/dL — AB
Specific Gravity, Urine: 1.012 (ref 1.005–1.030)
pH: 5 (ref 5.0–8.0)

## 2020-10-22 LAB — FIBRINOGEN: Fibrinogen: 620 mg/dL — ABNORMAL HIGH (ref 210–475)

## 2020-10-22 LAB — C-REACTIVE PROTEIN: CRP: 22 mg/dL — ABNORMAL HIGH (ref <1.0)

## 2020-10-22 LAB — CBG MONITORING, ED: Glucose-Capillary: 182 mg/dL — ABNORMAL HIGH (ref 70–99)

## 2020-10-22 LAB — D-DIMER, QUANTITATIVE: D-Dimer, Quant: 1.19 ug/mL-FEU — ABNORMAL HIGH (ref 0.00–0.50)

## 2020-10-22 LAB — PROCALCITONIN: Procalcitonin: 0.81 ng/mL

## 2020-10-22 LAB — TRIGLYCERIDES: Triglycerides: 76 mg/dL (ref <150)

## 2020-10-22 LAB — FERRITIN: Ferritin: 1011 ng/mL — ABNORMAL HIGH (ref 11–307)

## 2020-10-22 LAB — POC SARS CORONAVIRUS 2 AG -  ED: SARS Coronavirus 2 Ag: POSITIVE — AB

## 2020-10-22 LAB — LACTIC ACID, PLASMA: Lactic Acid, Venous: 1 mmol/L (ref 0.5–1.9)

## 2020-10-22 LAB — LACTATE DEHYDROGENASE: LDH: 321 U/L — ABNORMAL HIGH (ref 98–192)

## 2020-10-22 MED ORDER — SODIUM CHLORIDE 0.9 % IV SOLN
1000.0000 mL | INTRAVENOUS | Status: DC
  Administered 2020-10-22: 1000 mL via INTRAVENOUS

## 2020-10-22 MED ORDER — ACETAMINOPHEN 325 MG PO TABS
650.0000 mg | ORAL_TABLET | Freq: Four times a day (QID) | ORAL | Status: DC | PRN
  Administered 2020-10-22 – 2020-10-31 (×14): 650 mg via ORAL
  Filled 2020-10-22 (×14): qty 2

## 2020-10-22 MED ORDER — ACETAMINOPHEN 650 MG RE SUPP: 650.0000 mg | Freq: Four times a day (QID) | RECTAL | Status: DC | PRN

## 2020-10-22 MED ORDER — ONDANSETRON HCL 4 MG/2ML IJ SOLN
4.0000 mg | Freq: Once | INTRAMUSCULAR | Status: AC
  Administered 2020-10-22: 4 mg via INTRAVENOUS
  Filled 2020-10-22: qty 2

## 2020-10-22 MED ORDER — NITROGLYCERIN 0.4 MG SL SUBL: 0.4000 mg | SUBLINGUAL_TABLET | SUBLINGUAL | Status: DC | PRN

## 2020-10-22 MED ORDER — SODIUM CHLORIDE 0.9 % IV SOLN
200.0000 mg | Freq: Once | INTRAVENOUS | Status: AC
  Administered 2020-10-22: 200 mg via INTRAVENOUS
  Filled 2020-10-22 (×2): qty 40

## 2020-10-22 MED ORDER — SODIUM CHLORIDE 0.9 % IV SOLN
100.0000 mg | Freq: Every day | INTRAVENOUS | Status: AC
  Administered 2020-10-23 – 2020-10-26 (×4): 100 mg via INTRAVENOUS
  Filled 2020-10-22 (×4): qty 20

## 2020-10-22 MED ORDER — DEXAMETHASONE SODIUM PHOSPHATE 10 MG/ML IJ SOLN
10.0000 mg | Freq: Once | INTRAMUSCULAR | Status: AC
  Administered 2020-10-22: 10 mg via INTRAVENOUS
  Filled 2020-10-22: qty 1

## 2020-10-22 NOTE — TOC Initial Note (Signed)
Transition of Care [facility]) - Initial/Assessment Note    Patient Details  Name: Latasha [name] MRN: [id number] Date of Birth: 04/26/1956  Transition of [facility]) CM/SW Contact:    Verdell Carmine, RN Phone Number: 10/22/2020, 12:39 PM  Clinical Narrative:                  65 YO female admitted for COVID pneumonia, has had falls at home due to weakness, sick for 8 days prior to hospitalization. Has had exposure to COVID with sick contacts in family. Currently on 5 LPM n/c patient may need Home Health and oxygen at home. CM will follow for needs  Expected Discharge Plan: [place] Barriers to Discharge: Continued Medical Work up   Patient Goals and CMS Choice        Expected Discharge Plan and Services Expected Discharge Plan: [place]   Discharge Planning Services: CM Consult   Living arrangements for the past 2 months: Single Family Home                                      Prior Living Arrangements/Services Living arrangements for the past 2 months: Single Family Home Lives with:: Spouse Patient language and need for interpreter reviewed:: Yes        Need for Family Participation in Patient Care: Yes (Comment) Care giver support system in place?: Yes (comment)   Criminal Activity/Legal Involvement Pertinent to Current Situation/Hospitalization: No - Comment as needed  Activities of Daily Living      Permission Sought/Granted                  Emotional Assessment       Orientation: : Oriented to Self,Oriented to Place,Oriented to  Time,Oriented to Situation Alcohol / Substance Use: Not Applicable Psych Involvement: No (comment)  Admission diagnosis:  COVID Symptoms; SOB Patient Active Problem List   Diagnosis Date Noted  . Pneumonia due to COVID-19 virus 10/22/2020  . Acute on chronic respiratory failure with hypoxia ([place]) 10/22/2020  . Cardiorenal syndrome 03/05/2020  . Chest pain, rule out acute  myocardial infarction 03/07/2018  . CKD (chronic kidney disease) stage 4, GFR 15-29 ml/min ([facility]) 03/07/2018  . Hyperphosphatemia 06/09/2017  . Elevated ALT measurement 06/08/2017  . Hypertensive heart disease with acute on chronic diastolic congestive heart failure ([place])   . Bilateral leg edema   . Chronic diastolic CHF (congestive heart failure) ([place]) 06/07/2017  . Acute on chronic diastolic CHF (congestive heart failure) ([place]) 06/07/2017  . Acute respiratory failure with hypoxia ([place]) 06/07/2017  . ARF (acute renal failure) ([place])   . Encephalopathy acute 05/15/2017  . Hyperosmolar non-ketotic state in patient with type 2 diabetes mellitus ([place]) 05/15/2017  . Chronic pain 05/15/2017  . Acute kidney injury superimposed on chronic kidney disease ([place])   . Hyperlipemia 01/19/2014  . Palpitations 01/19/2014  . PVC's (premature ventricular contractions) 01/19/2014  . SCIATICA, RIGHT 12/12/2010  . GASTROESOPHAGEAL REFLUX DISEASE 02/09/2010  . LEG CRAMPS 02/09/2010  . Morbid obesity ([place]) 12/01/2009  . VITAMIN D DEFICIENCY 10/13/2009  . CONSTIPATION 10/13/2009  . LIVER FUNCTION TESTS, ABNORMAL, HX OF 10/13/2009  . Essential hypertension 08/24/2009  . SHOULDER PAIN, RIGHT, CHRONIC 08/24/2009  . Insulin-requiring or dependent type II diabetes mellitus ([place]) 10/08/1991   PCP:  Shirline Frees, MD Pharmacy:   [facility] [place] [address] [place] ([place]) [place] [address] Phone: [phone] Fax: [phone]  [place], [place] [address] Phone: [phone] Fax: [phone]     Social Determinants of Health (SDOH) Interventions    Readmission Risk Interventions No flowsheet data found.

## 2020-10-22 NOTE — ED Notes (Signed)
[facility] SARS COV-2 results of POSITIVE reported to Dr. Maryan Rued.

## 2020-10-22 NOTE — H&P (Addendum)
History and Physical  Latasha [name]:[id number] DOB: 11/11/1955 DOA: 10/22/2020  Referring physician: Blanchie Dessert, MD PCP: Shirline Frees, MD  Outpatient Specialists:  Patient coming from: Home & is able to ambulate   Chief Complaint: Shortness of breath  HPI: Latasha [name] is a 64 y.o. female with medical history significant for chronic congestive heart failure with eleventh-grader diastolic dysfunction chronic kidney disease stage IV, diabetes mellitus hypertension morbid obesity who presented to the emergency department this morning with 8 to 9 days of cough fever malaise generalized weakness intermittent vomiting diarrhea and shortness of breath.  She had had an appointment with her primary care doctor via telehealth who prescribed antibiotics which she completed several days ago initially she felt that the antibiotics was helping but the last few days she just started feeling worse with coughing not eating well fatigued and worsening shortness of breath.  Also worsening swelling of her legs and feet.  Stated she takes her water pill regularly.  She received 2 doses of Pfizer vaccine last one was April 2021 but has not received her booster dose.  Patient stated that her son tested positive for COVID and she has been around him.  She got worse today and decided to call EMS on arrival of the EMS they found patient with a saturation of 76% on room air and initially placed on 6 L which increased oxygenation to 87 and then she had to have a nonrebreather oxygen which increased her oxygen to 96% currently she is on room air at 5 L/min in the emergency department.     ED Course: Patient in the emergency room was placed on 5 L/min with saturation of 91 to 95%.  She was found to have acute on chronic kidney disease her baseline creatinine is around 2.8 today is 3.9.  Her blood sugar was 100 she was started on remdesivir and Decadron IV in the emergency department.  Seen her in the emergency  department she says she is feeling much better than when she came in.  She had a low-grade temperature of 99.8 patient was started on IV fluids in the ED as well as received 1 dose of Zofran for nausea  Review of Systems:  Pt complains of fatigue generalized weakness poor p.o. intake mild nausea Denies  vomiting diarrhea .  Review of systems are otherwise negative   Past Medical History:  Diagnosis Date  . BPPV (benign paroxysmal positional vertigo)   . Chronic congestive heart failure with left ventricular diastolic dysfunction ([place])   . CKD (chronic kidney disease), stage IV ([place]) 05/15/2017  . Diabetes mellitus   . Hyperlipidemia   . Hypertension   . Left atrial enlargement   . Morbid obesity with BMI of 40.0-44.9, adult ([place])   . Palpitations    Past Surgical History:  Procedure Laterality Date  . ABDOMINAL HYSTERECTOMY    . EYE SURGERY     cataract/left eye, right eye had bleeding in back  . KNEE SURGERY    . LEFT HEART CATH AND CORONARY ANGIOGRAPHY N/A 03/09/2018   Procedure: LEFT HEART CATH AND CORONARY ANGIOGRAPHY;  Surgeon: Lorretta Harp, MD;  Location: [facility];  Service: Cardiovascular;  Laterality: N/A;  . TRIGGER FINGER RELEASE      Social History:  reports that she has never smoked. She has never used smokeless tobacco. She reports current alcohol use. She reports that she does not use drugs.   Allergies  Allergen Reactions  . Hydrocodone-Acetaminophen Other (  See Comments)  . Oxycodone Hcl Other (See Comments)    Family History  Problem Relation Age of Onset  . Heart disease Father   . Hypertension Father   . Alzheimer's disease Mother   . Diabetes Mother   . Diabetes Sister   . Dementia Other       Prior to Admission medications   Medication Sig Start Date End Date Taking? Authorizing Provider  albuterol (PROVENTIL HFA;VENTOLIN HFA) 108 (90 Base) MCG/ACT inhaler Inhale 2 puffs into the lungs every 6 (six) hours as needed for wheezing or  shortness of breath.    [provider]  amLODipine (NORVASC) 10 MG tablet Take 1 tablet (10 mg total) by mouth daily. 05/18/17   Eugenie Filler, MD  atorvastatin (LIPITOR) 40 MG tablet Take 40 mg by mouth daily.     [provider]  carvedilol (COREG) 25 MG tablet Take 1 tablet (25 mg total) by mouth 2 (two) times daily with a meal. 03/10/18   Florencia Reasons, MD  diphenhydrAMINE (BENADRYL) 25 mg capsule Take 1 capsule (25 mg total) by mouth every 6 (six) hours as needed for itching. 03/10/18   Florencia Reasons, MD  furosemide (LASIX) 80 MG tablet Take 1 tablet (80 mg total) by mouth 2 (two) times daily. Patient taking differently: Take 80 mg by mouth daily.  03/06/20 07/25/20  Darliss Cheney, MD  gabapentin (NEURONTIN) 100 MG capsule Take 100 mg by mouth 3 (three) times daily.     [provider]  insulin aspart protamine- aspart (NOVOLOG MIX 70/30) (70-30) 100 UNIT/ML injection Inject 50-60 Units into the skin See admin instructions. Use 60 units every morning and use 50 units every evening    [provider]  lisinopril (ZESTRIL) 5 MG tablet Take 5 mg by mouth daily.    [provider]  nitroGLYCERIN (NITROSTAT) 0.4 MG SL tablet Place 1 tablet (0.4 mg total) under the tongue every 5 (five) minutes as needed for chest pain. 03/10/18   Florencia Reasons, MD  potassium chloride SA (K-DUR,KLOR-CON) 20 MEQ tablet Take 1 tablet (20 mEq total) by mouth daily. 06/11/17   Raiford Noble Latif, DO  tizanidine (ZANAFLEX) 2 MG capsule Take 2 mg by mouth daily as needed for muscle spasms. Patient not taking: Reported on 07/25/2020    [provider]  traMADol (ULTRAM) 50 MG tablet Take by mouth every 6 (six) hours as needed. Patient not taking: Reported on 07/25/2020    [provider]    Physical Exam: BP (!) 136/54   Pulse 70   Temp 99.8 F (37.7 C) (Oral)   Resp (!) 29   SpO2 96%   Exam:  . General: 65 y.o. year-old female well developed well nourished in no acute  distress.  Alert and oriented x3.  Obese . Cardiovascular: Regular rate and rhythm with no rubs or gallops.  No thyromegaly or JVD noted.   Marland Kitchen Respiratory: Clear to auscultation with no wheezes or rales. Good inspiratory effort. . Abdomen: Soft nontender nondistended with normal bowel sounds x4 quadrants. . Musculoskeletal: 1+ lower extremity edema. 2/4 pulses in all 4 extremities. . Skin: No ulcerative lesions noted or rashes, . Psychiatry: Mood is appropriate for condition and setting           Labs on Admission:  Basic Metabolic Panel: Recent Labs  Lab 10/22/20 0945  NA 137  K 5.2*  CL 106  CO2 18*  GLUCOSE 197*  BUN 79*  CREATININE 3.95*  CALCIUM 7.6*  Liver Function Tests: No results for input(s): AST, ALT, ALKPHOS, BILITOT, PROT, ALBUMIN in the last 168 hours. No results for input(s): LIPASE, AMYLASE in the last 168 hours. No results for input(s): AMMONIA in the last 168 hours. CBC: Recent Labs  Lab 10/22/20 0945  WBC 6.9  HGB 9.2*  HCT 28.6*  MCV 91.4  PLT 169   Cardiac Enzymes: No results for input(s): CKTOTAL, CKMB, CKMBINDEX, TROPONINI in the last 168 hours.  BNP (last 3 results) Recent Labs    03/05/20 0416  BNP 66.5    ProBNP (last 3 results) No results for input(s): PROBNP in the last 8760 hours.  CBG: Recent Labs  Lab 10/22/20 0946  GLUCAP 182*    Radiological Exams on Admission: DG Chest Port 1 View  Result Date: 10/22/2020 CLINICAL DATA:  Shortness of breath/hypoxia. COVID-19 positive exposure. EXAM: PORTABLE CHEST 1 VIEW COMPARISON:  10/09/2020 FINDINGS: Lungs are adequately inflated with patchy bilateral airspace opacification likely multifocal infection and may be viral in origin. No effusion. Borderline stable cardiomegaly. Remainder of the exam is unchanged. IMPRESSION: Patchy bilateral airspace process likely multifocal pneumonia which may be viral in origin. Electronically Signed   By: Marin Olp M.D.   On: 10/22/2020 09:44     EKG: Independently reviewed.  No STEMI  Assessment/Plan Present on Admission: . Morbid obesity ([place]) . CKD (chronic kidney disease) stage 4, GFR 15-29 ml/min ([facility]) . Hyperlipemia . Bilateral leg edema . Pneumonia due to COVID-19 virus . Acute on chronic respiratory failure with hypoxia (HCC)  Principal Problem:   Pneumonia due to COVID-19 virus Active Problems:   Insulin-requiring or dependent type II diabetes mellitus ([place])   Morbid obesity (HCC)   Hyperlipemia   Bilateral leg edema   CKD (chronic kidney disease) stage 4, GFR 15-29 ml/min (HCC)   Acute on chronic respiratory failure with hypoxia ([place])   1 COVID-19 pneumonia.  Patient has been started on remdesivir and Decadron  2.  Hypoxia due to COVID-19 pneumonia patient is doing well on 5 L/min oxygen nasal cannula  3.  Acute on chronic renal failure.  Her baseline creatinine is usually 2.8 currently is 3.9.  This may be due to poor p.o. intake due to malaise and fatigue from the COVID-pneumonia infection.  Patient is on Lasix 80 mg daily which I continue due to history of congestive heart failure and she is receiving fluid gentle hydration with normal saline it will need to be monitored and discontinued when appropriate.  I will hold her lisinopril and gabapentin.  4.  Mild hyperkalemia of 5.2 we will hold potassium replacement monitor electrolytes and resume potassium replacement when needed  5.  Type type 2 diabetes mellitus continue home dose insulin and sliding scale monitor blood sugar and cover accordingly  6.  Mild anemia may be from chronic kidney disease  7.  Elevated D-dimer fibrinogen and ferritin likely secondary to COVID-19 infection patient is on anticoagulation and DVT prophylaxis  8.  Hypertension controlled continue current management except hold lisinopril due to acute renal failure worsening  9.  Severity of Illness: The appropriate patient status for this patient is INPATIENT. Inpatient status  is judged to be reasonable and necessary in order to provide the required intensity of service to ensure the patient's safety. The patient's presenting symptoms, physical exam findings, and initial radiographic and laboratory data in the context of their chronic comorbidities is felt to place them at high risk for further clinical deterioration. Furthermore, it is not anticipated that  the patient will be medically stable for discharge from the hospital within 2 midnights of admission. The following factors support the patient status of inpatient.   " The patient's presenting symptoms include respiratory distress with hypoxia requiring oxygen at 5 L/min. " The worrisome physical exam findings include fever shortness of breath. " The initial radiographic and laboratory data are worrisome because of COVID-19 pneumonia. " The chronic co-morbidities include end-stage renal disease hypertension diabetes CHF.   * I certify that at the point of admission it is my clinical judgment that the patient will require inpatient hospital care spanning beyond 2 midnights from the point of admission due to high intensity of service, high risk for further deterioration and high frequency of surveillance required.*    DVT prophylaxis: Lovenox renal dose  Code Status: Full  Family Communication: Patient  Disposition Plan: Home when stable  Consults called: None  Admission status: Patient telemetry    Cristal Deer MD [facility] Pager [phone]  If 7PM-7AM, please contact night-coverage [email or web address] Password [facility]  10/22/2020, 12:54 PM

## 2020-10-22 NOTE — ED Triage Notes (Signed)
Pt to triage via [facility] from home on 15 liters NRB.  Pt has +COVID contact at home.  Reports upper abd pain, SOB, nausea, vomiting, diarrhea, body aches, fever, and chills.  CBG 247.  Initial O2 sats for EMS was 76% on room air, 87% on 6 liters, and 96% on 15 liters.  Pt reports R knee pain from frequent falls.

## 2020-10-22 NOTE — ED Notes (Signed)
Urine sent to lab WITH a culture.

## 2020-10-22 NOTE — ED Provider Notes (Signed)
[facility] EMERGENC[facility] DEPARTMENT Provider Note   CSN: [id number] Arrival date & time: 10/22/20  0900     History Chief Complaint  Patient presents with  . Covid Exposure  . Shortness of Breath  . Abdominal Pain    Latasha [name] is a 65 y.o. female.  Patient is a 65 year old female with a history of hypertension, hyperlipidemia, diabetes, chronic kidney disease, morbid obesity who is presenting today with 8 or 9 days of cough, fever, malaise, generalized weakness, intermittent vomiting, diarrhea and shortness of breath.  She reports that she spoke with Dr. Kenton Kingfisher earlier this week via video call and was given antibiotics which she completed several days ago.  Initially she felt like the antibiotics were helping but in the last few days she has just been feeling worse.  She has had poor oral intake and feels like the shortness of breath has worsened over the last few days.  Any type of activity makes her shortness of breath worse and laying down.  She has not noticed any new swelling in her legs and has not otherwise changed any of her medications.  She did receive the 2 dose Pfizer vaccine in April of last year but did not receive booster.  She has also been around her son who tested positive for COVID.  Patient called EMS today and when they arrived she was found to be satting 76% on room air.  They initially placed her on 6 L which increased her oxygen saturation to 87% and then placed her on a nonrebreather with oxygen saturation of 96%.  Patient also reports that over the last week she has had frequent falls because she has been so globally weak.  The history is provided by the patient, the EMS personnel and medical records.       Past Medical History:  Diagnosis Date  . BPPV (benign paroxysmal positional vertigo)   . Chronic congestive heart failure with left ventricular diastolic dysfunction ([place])   . CKD (chronic kidney disease), stage IV ([place]) 05/15/2017  .  Diabetes mellitus   . Hyperlipidemia   . Hypertension   . Left atrial enlargement   . Morbid obesity with BMI of 40.0-44.9, adult ([place])   . Palpitations     Patient Active Problem List   Diagnosis Date Noted  . Cardiorenal syndrome 03/05/2020  . Chest pain, rule out acute myocardial infarction 03/07/2018  . CKD (chronic kidney disease) stage 4, GFR 15-29 ml/min (HCC) 03/07/2018  . Hyperphosphatemia 06/09/2017  . Elevated ALT measurement 06/08/2017  . Hypertensive heart disease with acute on chronic diastolic congestive heart failure ([place])   . Bilateral leg edema   . Chronic diastolic CHF (congestive heart failure) ([place]) 06/07/2017  . Acute on chronic diastolic CHF (congestive heart failure) ([place]) 06/07/2017  . Acute respiratory failure with hypoxia ([place]) 06/07/2017  . ARF (acute renal failure) ([place])   . Encephalopathy acute 05/15/2017  . Hyperosmolar non-ketotic state in patient with type 2 diabetes mellitus ([place]) 05/15/2017  . Chronic pain 05/15/2017  . Acute kidney injury superimposed on chronic kidney disease ([place])   . Hyperlipemia 01/19/2014  . Palpitations 01/19/2014  . PVC's (premature ventricular contractions) 01/19/2014  . SCIATICA, RIGHT 12/12/2010  . GASTROESOPHAGEAL REFLUX DISEASE 02/09/2010  . LEG CRAMPS 02/09/2010  . Morbid obesity ([place]) 12/01/2009  . VITAMIN D DEFICIENCY 10/13/2009  . CONSTIPATION 10/13/2009  . LIVER FUNCTION TESTS, ABNORMAL, HX OF 10/13/2009  . Essential hypertension 08/24/2009  . SHOULDER PAIN, RIGHT, CHRONIC 08/24/2009  .  Insulin-requiring or dependent type II diabetes mellitus ([place]) 10/08/1991    Past Surgical History:  Procedure Laterality Date  . ABDOMINAL HYSTERECTOMY    . EYE SURGERY     cataract/left eye, right eye had bleeding in back  . KNEE SURGERY    . LEFT HEART CATH AND CORONARY ANGIOGRAPHY N/A 03/09/2018   Procedure: LEFT HEART CATH AND CORONARY ANGIOGRAPHY;  Surgeon: Lorretta Harp, MD;  Location: [facility];   Service: Cardiovascular;  Laterality: N/A;  . TRIGGER FINGER RELEASE       OB History   No obstetric history on file.     Family History  Problem Relation Age of Onset  . Heart disease Father   . Hypertension Father   . Alzheimer's disease Mother   . Diabetes Mother   . Diabetes Sister   . Dementia Other     Social History   Tobacco Use  . Smoking status: Never Smoker  . Smokeless tobacco: Never Used  Substance Use Topics  . Alcohol use: Yes    Comment:  rare  . Drug use: No    Home Medications Prior to Admission medications   Medication Sig Start Date End Date Taking? Authorizing Provider  albuterol (PROVENTIL HFA;VENTOLIN HFA) 108 (90 Base) MCG/ACT inhaler Inhale 2 puffs into the lungs every 6 (six) hours as needed for wheezing or shortness of breath.    [provider]  amLODipine (NORVASC) 10 MG tablet Take 1 tablet (10 mg total) by mouth daily. 05/18/17   Eugenie Filler, MD  atorvastatin (LIPITOR) 40 MG tablet Take 40 mg by mouth daily.     [provider]  carvedilol (COREG) 25 MG tablet Take 1 tablet (25 mg total) by mouth 2 (two) times daily with a meal. 03/10/18   Florencia Reasons, MD  diphenhydrAMINE (BENADRYL) 25 mg capsule Take 1 capsule (25 mg total) by mouth every 6 (six) hours as needed for itching. 03/10/18   Florencia Reasons, MD  furosemide (LASIX) 80 MG tablet Take 1 tablet (80 mg total) by mouth 2 (two) times daily. Patient taking differently: Take 80 mg by mouth daily.  03/06/20 07/25/20  Darliss Cheney, MD  gabapentin (NEURONTIN) 100 MG capsule Take 100 mg by mouth 3 (three) times daily.     [provider]  insulin aspart protamine- aspart (NOVOLOG MIX 70/30) (70-30) 100 UNIT/ML injection Inject 50-60 Units into the skin See admin instructions. Use 60 units every morning and use 50 units every evening    [provider]  lisinopril (ZESTRIL) 5 MG tablet Take 5 mg by mouth daily.    [provider]  nitroGLYCERIN (NITROSTAT)  0.4 MG SL tablet Place 1 tablet (0.4 mg total) under the tongue every 5 (five) minutes as needed for chest pain. 03/10/18   Florencia Reasons, MD  potassium chloride SA (K-DUR,KLOR-CON) 20 MEQ tablet Take 1 tablet (20 mEq total) by mouth daily. 06/11/17   Raiford Noble Latif, DO  tizanidine (ZANAFLEX) 2 MG capsule Take 2 mg by mouth daily as needed for muscle spasms. Patient not taking: Reported on 07/25/2020    [provider]  traMADol (ULTRAM) 50 MG tablet Take by mouth every 6 (six) hours as needed. Patient not taking: Reported on 07/25/2020    [provider]    Allergies    Hydrocodone-acetaminophen and Oxycodone hcl  Review of Systems   Review of Systems  All other systems reviewed and are negative.   Physical Exam Updated Vital Signs BP Marland Kitchen)  142/68 (BP Location: Right Arm)   Pulse 72   Temp 99.8 F (37.7 C) (Oral)   Resp (!) 24   SpO2 96%   Physical Exam Vitals and nursing note reviewed.  Constitutional:      General: She is not in acute distress.    Appearance: She is well-developed and well-nourished. She is ill-appearing.  HENT:     Head: Normocephalic and atraumatic.     Nose: Nose normal.     Mouth/Throat:     Mouth: Mucous membranes are dry.  Eyes:     Extraocular Movements: EOM normal.     Pupils: Pupils are equal, round, and reactive to light.  Cardiovascular:     Rate and Rhythm: Normal rate and regular rhythm.     Pulses: Normal pulses and intact distal pulses.     Heart sounds: Normal heart sounds. No murmur heard. No friction rub.  Pulmonary:     Effort: Pulmonary effort is normal.     Breath sounds: Rales present. No wheezing.     Comments: Fine crackles noted in the bases of both lungs Abdominal:     General: Bowel sounds are normal. There is no distension.     Palpations: Abdomen is soft.     Tenderness: There is no abdominal tenderness. There is no guarding or rebound.  Musculoskeletal:        General: Tenderness present. Normal range of  motion.     Cervical back: Normal range of motion and neck supple.     Right lower leg: Edema present.     Left lower leg: Edema present.     Comments: Trace edema noted in bilateral tib-fib area.  Mild tenderness over the right patella but no significant ecchymosis or effusion.  Full ROM and pt is able to stand on it without difficulty  Skin:    General: Skin is warm and dry.     Findings: No rash.  Neurological:     General: No focal deficit present.     Mental Status: She is alert and oriented to person, place, and time. Mental status is at baseline.     Cranial Nerves: No cranial nerve deficit.  Psychiatric:        Mood and Affect: Mood and affect and mood normal.        Behavior: Behavior normal.        Thought Content: Thought content normal.     ED Results / Procedures / Treatments   Labs (all labs ordered are listed, but only abnormal results are displayed) Labs Reviewed  BASIC METABOLIC PANEL - Abnormal; Notable for the following components:      Result Value   Potassium 5.2 (*)    CO2 18 (*)    Glucose, Bld 197 (*)    BUN 79 (*)    Creatinine, Ser 3.95 (*)    Calcium 7.6 (*)    GFR, Estimated 12 (*)    All other components within normal limits  CBC - Abnormal; Notable for the following components:   RBC 3.13 (*)    Hemoglobin 9.2 (*)    HCT 28.6 (*)    All other components within normal limits  URINALYSIS, ROUTINE W REFLEX MICROSCOPIC - Abnormal; Notable for the following components:   APPearance CLOUDY (*)    Hgb urine dipstick SMALL (*)    Protein, ur 100 (*)    Bacteria, UA FEW (*)    All other components within normal limits  LACTATE DEHYDROGENASE - Abnormal;  Notable for the following components:   LDH 321 (*)    All other components within normal limits  C-REACTIVE PROTEIN - Abnormal; Notable for the following components:   CRP 22.0 (*)    All other components within normal limits  D-DIMER, QUANTITATIVE (NOT AT [facility]) - Abnormal; Notable for the  following components:   D-Dimer, Quant 1.19 (*)    All other components within normal limits  FERRITIN - Abnormal; Notable for the following components:   Ferritin 1,011 (*)    All other components within normal limits  FIBRINOGEN - Abnormal; Notable for the following components:   Fibrinogen 620 (*)    All other components within normal limits  CBG MONITORING, ED - Abnormal; Notable for the following components:   Glucose-Capillary 182 (*)    All other components within normal limits  POC SARS CORONAVIRUS 2 AG -  ED - Abnormal; Notable for the following components:   SARS Coronavirus 2 Ag POSITIVE (*)    All other components within normal limits  CULTURE, BLOOD (ROUTINE X 2)  CULTURE, BLOOD (ROUTINE X 2)  LACTIC ACID, PLASMA  TRIGLYCERIDES  LACTIC ACID, PLASMA  PROCALCITONIN    EKG EKG Interpretation  Date/Time:  Sunday October 22 2020 09:11:55 EST Ventricular Rate:  75 PR Interval:    QRS Duration: 86 QT Interval:  366 QTC Calculation: 408 R Axis:   1 Text Interpretation: Sinus rhythm Cannot rule out Anterior infarct , age undetermined Artifact No significant change since last tracing Confirmed by Romy Ipock ([id number]) on 10/22/2020 9:38:49 AM   Radiology DG Chest Port 1 View  Result Date: 10/22/2020 CLINICAL DATA:  Shortness of breath/hypoxia. COVID-19 positive exposure. EXAM: PORTABLE CHEST 1 VIEW COMPARISON:  10/09/2020 FINDINGS: Lungs are adequately inflated with patchy bilateral airspace opacification likely multifocal infection and may be viral in origin. No effusion. Borderline stable cardiomegaly. Remainder of the exam is unchanged. IMPRESSION: Patchy bilateral airspace process likely multifocal pneumonia which may be viral in origin. Electronically Signed   By: Daniel  Boyle M.D.   On: 10/22/2020 09:44    Procedures Procedures (including critical care time)  Medications Ordered in ED Medications  0.9 %  sodium chloride infusion (has no administration in  time range)  ondansetron (ZOFRAN) injection 4 mg (has no administration in time range)    ED Course  I have reviewed the triage vital signs and the nursing notes.  Pertinent labs & imaging results that were available during my care of the patient were reviewed by me and considered in my medical decision making (see chart for details).    MDM Rules/Calculators/A&P                          64  year old female with multiple medical problems presenting today with symptoms suggestive of COVID-pneumonia.  She is hypoxic and requiring approximately 5 L of oxygen to maintain sats between 90 and 94%.  She is mildly tachypneic and does have rales bilaterally.  She has no significant findings for fluid overload and low suspicion for ACS, CHF.  Patient has not been eating well and does have history of chronic kidney disease.  We will use fluids judiciously and patient given antiemetics.  COVID.  Labs are pending.  COVID antigen test is positive.  11:29 AM Patient's labs are consistent with COVID, UA without acute findings, lactic acid within normal limits, CRP elevated at 22, D-dimer elevated at 1.19, ferritin elevated at 1011, fibrinogen elevated at 620, patient  is displaying some AKI today with creatinine of 3.95 from baseline of 2.8.  This is most likely related to some dehydration given her poor oral intake and prerenal cause.  CBC with stable hemoglobin and normal white count.  Chest x-ray showing a patchy bilateral airspace process likely multifocal pneumonia.  Patient remains on 5 L seems to be tolerating well.  Patient given Decadron and will order remdesivir.  MDM Number of Diagnoses or Management Options   Amount and/or Complexity of Data Reviewed Clinical lab tests: ordered and reviewed Tests in the radiology section of CPT: ordered and reviewed Tests in the medicine section of CPT: ordered and reviewed Decide to obtain previous medical records or to obtain history from someone other than  the patient: yes Obtain history from someone other than the patient: yes Review and summarize past medical records: yes Discuss the patient with other providers: yes Independent visualization of images, tracings, or specimens: yes  Risk of Complications, Morbidity, and/or Mortality Presenting problems: high Diagnostic procedures: moderate Management options: moderate  Patient Progress Patient progress: improved  Latasha [name] was evaluated in Emergency Department on 10/22/2020 for the symptoms described in the history of present illness. She was evaluated in the context of the global COVID-19 pandemic, which necessitated consideration that the patient might be at risk for infection with the SARS-CoV-2 virus that causes COVID-19. Institutional protocols and algorithms that pertain to the evaluation of patients at risk for COVID-19 are in a state of rapid change based on information released by regulatory bodies including the CDC and federal and state organizations. These policies and algorithms were followed during the patient's care in the ED. CRITICAL CARE Performed by: Denelle Capurro Total critical care time: 30 minutes Critical care time was exclusive of separately billable procedures and treating other patients. Critical care was necessary to treat or prevent imminent or life-threatening deterioration. Critical care was time spent personally by me on the following activities: development of treatment plan with patient and/or surrogate as well as nursing, discussions with consultants, evaluation of patient's response to treatment, examination of patient, obtaining history from patient or surrogate, ordering and performing treatments and interventions, ordering and review of laboratory studies, ordering and review of radiographic studies, pulse oximetry and re-evaluation of patient's condition.  Final Clinical Impression(s) / ED Diagnoses Final diagnoses:  Pneumonia due to COVID-19 virus   Acute respiratory failure with hypoxia ([place])  AKI (acute kidney injury) [facility])    Rx / DC Orders ED Discharge Orders    None       Blanchie Dessert, MD 10/22/20 1131

## 2020-10-23 DIAGNOSIS — J1282 Pneumonia due to coronavirus disease 2019: Secondary | ICD-10-CM | POA: Diagnosis not present

## 2020-10-23 DIAGNOSIS — U071 COVID-19: Secondary | ICD-10-CM | POA: Diagnosis not present

## 2020-10-23 LAB — COMPREHENSIVE METABOLIC PANEL
ALT: 52 U/L — ABNORMAL HIGH (ref 0–44)
AST: 46 U/L — ABNORMAL HIGH (ref 15–41)
Albumin: 2.7 g/dL — ABNORMAL LOW (ref 3.5–5.0)
Alkaline Phosphatase: 121 U/L (ref 38–126)
Anion gap: 13 (ref 5–15)
BUN: 104 mg/dL — ABNORMAL HIGH (ref 8–23)
CO2: 16 mmol/L — ABNORMAL LOW (ref 22–32)
Calcium: 6.9 mg/dL — ABNORMAL LOW (ref 8.9–10.3)
Chloride: 106 mmol/L (ref 98–111)
Creatinine, Ser: 5.5 mg/dL — ABNORMAL HIGH (ref 0.44–1.00)
GFR, Estimated: 8 mL/min — ABNORMAL LOW (ref >60)
Glucose, Bld: 359 mg/dL — ABNORMAL HIGH (ref 70–99)
Potassium: 6 mmol/L — ABNORMAL HIGH (ref 3.5–5.1)
Sodium: 135 mmol/L (ref 135–145)
Total Bilirubin: 0.5 mg/dL (ref 0.3–1.2)
Total Protein: 6.6 g/dL (ref 6.5–8.1)

## 2020-10-23 LAB — CBG MONITORING, ED
Glucose-Capillary: 328 mg/dL — ABNORMAL HIGH (ref 70–99)
Glucose-Capillary: 321 mg/dL — ABNORMAL HIGH (ref 70–99)
Glucose-Capillary: 308 mg/dL — ABNORMAL HIGH (ref 70–99)

## 2020-10-23 LAB — CBC
HCT: 28.3 % — ABNORMAL LOW (ref 36.0–46.0)
Hemoglobin: 8.5 g/dL — ABNORMAL LOW (ref 12.0–15.0)
MCH: 28.5 pg (ref 26.0–34.0)
MCHC: 30 g/dL (ref 30.0–36.0)
MCV: 95 fL (ref 80.0–100.0)
Platelets: 179 10*3/uL (ref 150–400)
RBC: 2.98 MIL/uL — ABNORMAL LOW (ref 3.87–5.11)
RDW: 14 % (ref 11.5–15.5)
WBC: 6.6 10*3/uL (ref 4.0–10.5)
nRBC: 0 % (ref 0.0–0.2)

## 2020-10-23 LAB — PROTIME-INR
INR: 1.2 (ref 0.8–1.2)
Prothrombin Time: 14.3 seconds (ref 11.4–15.2)

## 2020-10-23 LAB — LACTIC ACID, PLASMA: Lactic Acid, Venous: 0.6 mmol/L (ref 0.5–1.9)

## 2020-10-23 LAB — HEMOGLOBIN A1C
Hgb A1c MFr Bld: 7.9 % — ABNORMAL HIGH (ref 4.8–5.6)
Mean Plasma Glucose: 180.03 mg/dL

## 2020-10-23 MED ORDER — ASPIRIN EC 81 MG PO TBEC
81.0000 mg | DELAYED_RELEASE_TABLET | Freq: Every day | ORAL | Status: DC
  Administered 2020-10-23 – 2020-11-01 (×10): 81 mg via ORAL
  Filled 2020-10-23 (×10): qty 1

## 2020-10-23 MED ORDER — BARICITINIB 1 MG PO TABS
1.0000 mg | ORAL_TABLET | Freq: Every day | ORAL | Status: DC
  Administered 2020-10-24 – 2020-10-25 (×2): 1 mg via ORAL
  Filled 2020-10-23 (×3): qty 1

## 2020-10-23 MED ORDER — ALBUTEROL SULFATE HFA 108 (90 BASE) MCG/ACT IN AERS
2.0000 | INHALATION_SPRAY | Freq: Four times a day (QID) | RESPIRATORY_TRACT | Status: DC | PRN
  Filled 2020-10-23: qty 6.7

## 2020-10-23 MED ORDER — ENOXAPARIN SODIUM 30 MG/0.3ML ~~LOC~~ SOLN
30.0000 mg | SUBCUTANEOUS | Status: DC
  Administered 2020-10-23 – 2020-10-25 (×3): 30 mg via SUBCUTANEOUS
  Filled 2020-10-23 (×3): qty 0.3

## 2020-10-23 MED ORDER — INSULIN ASPART PROT & ASPART (70-30 MIX) 100 UNIT/ML ~~LOC~~ SUSP
50.0000 [IU] | Freq: Every day | SUBCUTANEOUS | Status: DC
  Administered 2020-10-23 – 2020-10-26 (×4): 50 [IU] via SUBCUTANEOUS
  Filled 2020-10-23 (×2): qty 10

## 2020-10-23 MED ORDER — CARVEDILOL 25 MG PO TABS
25.0000 mg | ORAL_TABLET | Freq: Two times a day (BID) | ORAL | Status: DC
  Administered 2020-10-23 – 2020-11-01 (×17): 25 mg via ORAL
  Filled 2020-10-23 (×11): qty 1
  Filled 2020-10-23: qty 8
  Filled 2020-10-23 (×5): qty 1

## 2020-10-23 MED ORDER — SODIUM CHLORIDE 0.9 % IV SOLN
1000.0000 mL | INTRAVENOUS | Status: DC
  Administered 2020-10-23: 1000 mL via INTRAVENOUS

## 2020-10-23 MED ORDER — FUROSEMIDE 80 MG PO TABS
80.0000 mg | ORAL_TABLET | Freq: Every day | ORAL | Status: DC
  Administered 2020-10-23 – 2020-10-24 (×2): 80 mg via ORAL
  Filled 2020-10-23: qty 4
  Filled 2020-10-23: qty 1

## 2020-10-23 MED ORDER — INSULIN ASPART PROT & ASPART (70-30 MIX) 100 UNIT/ML ~~LOC~~ SUSP
60.0000 [IU] | Freq: Every day | SUBCUTANEOUS | Status: DC
  Administered 2020-10-23 – 2020-10-27 (×3): 60 [IU] via SUBCUTANEOUS
  Filled 2020-10-23 (×2): qty 10

## 2020-10-23 MED ORDER — ATORVASTATIN CALCIUM 40 MG PO TABS
40.0000 mg | ORAL_TABLET | Freq: Every day | ORAL | Status: DC
  Administered 2020-10-23 – 2020-10-24 (×2): 40 mg via ORAL
  Filled 2020-10-23 (×2): qty 1

## 2020-10-23 MED ORDER — DEXAMETHASONE 4 MG PO TABS
6.0000 mg | ORAL_TABLET | Freq: Every day | ORAL | Status: DC
  Administered 2020-10-23 – 2020-10-24 (×2): 6 mg via ORAL
  Filled 2020-10-23 (×2): qty 2

## 2020-10-23 MED ORDER — INSULIN ASPART 100 UNIT/ML ~~LOC~~ SOLN
0.0000 [IU] | Freq: Three times a day (TID) | SUBCUTANEOUS | Status: DC
  Administered 2020-10-23 (×3): 11 [IU] via SUBCUTANEOUS

## 2020-10-23 MED ORDER — AMLODIPINE BESYLATE 10 MG PO TABS
10.0000 mg | ORAL_TABLET | Freq: Every day | ORAL | Status: DC
  Administered 2020-10-23 – 2020-11-01 (×9): 10 mg via ORAL
  Filled 2020-10-23 (×7): qty 1
  Filled 2020-10-23: qty 2
  Filled 2020-10-23 (×2): qty 1

## 2020-10-23 MED ORDER — BARICITINIB 2 MG PO TABS
4.0000 mg | ORAL_TABLET | Freq: Every day | ORAL | Status: DC
  Administered 2020-10-23: 4 mg via ORAL
  Filled 2020-10-23: qty 2

## 2020-10-23 NOTE — ED Notes (Signed)
Lunch Tray Ordered @ 1109. °

## 2020-10-23 NOTE — Progress Notes (Signed)
PROGRESS NOTE    JAYLEIGH NOTARIANNI  NLG:[id number] DOB: 02-20-1956 DOA: 10/22/2020 PCP: Shirline Frees, MD   Brief Narrative:  Latasha [name] is a 65 y.o. female with medical history significant for chronic congestive heart failure with diastolic dysfunction, chronic kidney disease stage IV, diabetes mellitus hypertension morbid obesity who presented to the emergency department this morning with 8 to 9 days of cough fever malaise generalized weakness intermittent vomiting diarrhea and shortness of breath.  She had had an appointment with her primary care doctor via telehealth who prescribed antibiotics which she completed several days ago initially she felt that the antibiotics was helping but the last few days she just started feeling worse with coughing not eating well fatigued and worsening shortness of breath.  Also worsening swelling of her legs and feet.  Stated she takes her water pill regularly.  She received 2 doses of Pfizer vaccine last one was April 2021 but has not received her booster dose.  Patient stated that her son tested positive for COVID and she has been around him.  She got worse today and decided to call EMS on arrival of the EMS they found patient with a saturation of 76% on room air and initially placed on 6 L which increased oxygenation to 87 and then she had to have a nonrebreather oxygen which increased her oxygen to 96% currently she is on room air at 5 L/min in the emergency department. In the emergency room was placed on 5 L/min with saturation of 91 to 95%.  She was found to have acute on chronic kidney disease her baseline creatinine is around 2.8 today is 3.9.  Her blood sugar was 100 she was started on remdesivir and Decadron IV in the emergency department.  Seen her in the emergency department she says she is feeling much better than when she came in.  She had a low-grade temperature of 99.8 patient was started on IV fluids in the ED as well as received 1 dose of Zofran for  nausea  Assessment & Plan:   Principal Problem:   Pneumonia due to COVID-19 virus Active Problems:   Insulin-requiring or dependent type II diabetes mellitus ([place])   Morbid obesity ([place])   Hyperlipemia   Bilateral leg edema   CKD (chronic kidney disease) stage 4, GFR 15-29 ml/min (HCC)   Acute on chronic respiratory failure with hypoxia (HCC)   Acute hypoxic respiratory failure secondary to covid 19 pneumonia, POA SpO2: 90 % O2 Flow Rate (L/min): 5 L/min Continue remdesivir, barcitinib, steroids per protocol Early ambulation/prone/incentive spriometry/flutter as tolerated Patient has 2 vaccines but no booster - more than 6 months since second vaccine Recent Labs    10/22/20 0945 10/22/20 0953  DDIMER  --  1.19*  FERRITIN  --  1,011*  LDH 321*  --   CRP  --  22.0*   AKI on CKD4 Likely secondary to poor PO intake due to covid infection Increase PO intake as tolerated  IDDM2 - uncontrolled with hyperglycemia Continue to monitor closely while on steroids as above Sliding scale insulin/hypoglycemic protocol  Chronic anemia of CKD, stable  DVT prophylaxis: Lovenox Code Status: Full Family Communication: None - patient to update  Status is: INPT  Dispo: The patient is from: home              Anticipated d/c is to: home              Anticipated d/c date is: 15-72h  Patient currently NOT medically stable for discharge  Consultants:   None  Procedures:   None  Antimicrobials:  Remdesivir   Subjective: No acute issues/events overnight, shortness of breath improving  Objective: Vitals:   10/22/20 2230 10/23/20 0130 10/23/20 0200 10/23/20 0230  BP:  (!) 142/102 (!) 124/48 (!) 119/45  Pulse: (!) 58 64 (!) 58 (!) 58  Resp: 15 18 13 13   Temp:      TempSrc:      SpO2: 96% 96% 95% 97%    Intake/Output Summary (Last 24 hours) at 10/23/2020 0630 Last data filed at 10/22/2020 1507 Gross per 24 hour  Intake --  Output 2 ml  Net -2 ml   There  were no vitals filed for this visit.  Examination:  General exam: Appears calm and comfortable  Respiratory system: Clear to auscultation. Respiratory effort normal. Cardiovascular system: S1 & S2 heard, RRR. No JVD, murmurs, rubs, gallops or clicks. No pedal edema. Gastrointestinal system: Abdomen is nondistended, soft and nontender. No organomegaly or masses felt. Normal bowel sounds heard. Central nervous system: Alert and oriented. No focal neurological deficits. Extremities: Symmetric 5 x 5 power. Skin: No rashes, lesions or ulcers Psychiatry: Judgement and insight appear normal. Mood & affect appropriate.   Data Reviewed: I have personally reviewed following labs and imaging studies  CBC: Recent Labs  Lab 10/22/20 0945  WBC 6.9  HGB 9.2*  HCT 28.6*  MCV 91.4  PLT 160   Basic Metabolic Panel: Recent Labs  Lab 10/22/20 0945  NA 137  K 5.2*  CL 106  CO2 18*  GLUCOSE 197*  BUN 79*  CREATININE 3.95*  CALCIUM 7.6*   GFR: CrCl cannot be calculated (Unknown ideal weight.). Liver Function Tests: No results for input(s): AST, ALT, ALKPHOS, BILITOT, PROT, ALBUMIN in the last 168 hours. No results for input(s): LIPASE, AMYLASE in the last 168 hours. No results for input(s): AMMONIA in the last 168 hours. Coagulation Profile: Recent Labs  Lab 10/23/20 0335  INR 1.2   Cardiac Enzymes: No results for input(s): CKTOTAL, CKMB, CKMBINDEX, TROPONINI in the last 168 hours. BNP (last 3 results) No results for input(s): PROBNP in the last 8760 hours. HbA1C: No results for input(s): HGBA1C in the last 72 hours. CBG: Recent Labs  Lab 10/22/20 0946  GLUCAP 182*   Lipid Profile: Recent Labs    10/22/20 0945  TRIG 76   Thyroid Function Tests: No results for input(s): TSH, T4TOTAL, FREET4, T3FREE, THYROIDAB in the last 72 hours. Anemia Panel: Recent Labs    10/22/20 0953  FERRITIN 1,011*   Sepsis Labs: Recent Labs  Lab 10/22/20 0945 10/22/20 0953  10/23/20 0335  PROCALCITON 0.81  --   --   LATICACIDVEN  --  1.0 0.6    No results found for this or any previous visit (from the past 240 hour(s)).   Radiology Studies: DG Chest Port 1 View  Result Date: 10/22/2020 CLINICAL DATA:  Shortness of breath/hypoxia. COVID-19 positive exposure. EXAM: PORTABLE CHEST 1 VIEW COMPARISON:  10/09/2020 FINDINGS: Lungs are adequately inflated with patchy bilateral airspace opacification likely multifocal infection and may be viral in origin. No effusion. Borderline stable cardiomegaly. Remainder of the exam is unchanged. IMPRESSION: Patchy bilateral airspace process likely multifocal pneumonia which may be viral in origin. Electronically Signed   By: Marin Olp M.D.   On: 10/22/2020 09:44   Scheduled Meds: . baricitinib  4 mg Oral Daily   Continuous Infusions: . sodium chloride 1,000 mL (10/22/20 0956)  .  remdesivir 100 mg in NS 100 mL       LOS: 1 day    Time spent: 41min    Shavonna Corella C Tylah Mancillas, DO [facility]  If 7PM-7AM, please contact night-coverage [email or web address]  10/23/2020, 6:34 AM

## 2020-10-23 NOTE — ED Notes (Signed)
Breakfast Ordered 

## 2020-10-24 ENCOUNTER — Inpatient Hospital Stay (HOSPITAL_COMMUNITY): Payer: Medicare HMO

## 2020-10-24 DIAGNOSIS — U071 COVID-19: Secondary | ICD-10-CM | POA: Diagnosis not present

## 2020-10-24 DIAGNOSIS — J1282 Pneumonia due to coronavirus disease 2019: Secondary | ICD-10-CM | POA: Diagnosis not present

## 2020-10-24 LAB — BASIC METABOLIC PANEL
Anion gap: 12 (ref 5–15)
Anion gap: 15 (ref 5–15)
Anion gap: 14 (ref 5–15)
BUN: 125 mg/dL — ABNORMAL HIGH (ref 8–23)
BUN: 127 mg/dL — ABNORMAL HIGH (ref 8–23)
BUN: 131 mg/dL — ABNORMAL HIGH (ref 8–23)
CO2: 16 mmol/L — ABNORMAL LOW (ref 22–32)
CO2: 16 mmol/L — ABNORMAL LOW (ref 22–32)
CO2: 15 mmol/L — ABNORMAL LOW (ref 22–32)
Calcium: 6.5 mg/dL — ABNORMAL LOW (ref 8.9–10.3)
Calcium: 6.7 mg/dL — ABNORMAL LOW (ref 8.9–10.3)
Calcium: 6.4 mg/dL — CL (ref 8.9–10.3)
Chloride: 103 mmol/L (ref 98–111)
Chloride: 102 mmol/L (ref 98–111)
Chloride: 102 mmol/L (ref 98–111)
Creatinine, Ser: 6.83 mg/dL — ABNORMAL HIGH (ref 0.44–1.00)
Creatinine, Ser: 7.09 mg/dL — ABNORMAL HIGH (ref 0.44–1.00)
Creatinine, Ser: 7.34 mg/dL — ABNORMAL HIGH (ref 0.44–1.00)
GFR, Estimated: 6 mL/min — ABNORMAL LOW (ref >60)
GFR, Estimated: 6 mL/min — ABNORMAL LOW (ref >60)
GFR, Estimated: 6 mL/min — ABNORMAL LOW (ref >60)
Glucose, Bld: 279 mg/dL — ABNORMAL HIGH (ref 70–99)
Glucose, Bld: 328 mg/dL — ABNORMAL HIGH (ref 70–99)
Glucose, Bld: 353 mg/dL — ABNORMAL HIGH (ref 70–99)
Potassium: 6.7 mmol/L (ref 3.5–5.1)
Potassium: 6.3 mmol/L (ref 3.5–5.1)
Potassium: 6 mmol/L — ABNORMAL HIGH (ref 3.5–5.1)
Sodium: 131 mmol/L — ABNORMAL LOW (ref 135–145)
Sodium: 133 mmol/L — ABNORMAL LOW (ref 135–145)
Sodium: 131 mmol/L — ABNORMAL LOW (ref 135–145)

## 2020-10-24 LAB — RENAL FUNCTION PANEL
Albumin: 2.3 g/dL — ABNORMAL LOW (ref 3.5–5.0)
Anion gap: 15 (ref 5–15)
BUN: 134 mg/dL — ABNORMAL HIGH (ref 8–23)
CO2: 14 mmol/L — ABNORMAL LOW (ref 22–32)
Calcium: 6.2 mg/dL — CL (ref 8.9–10.3)
Chloride: 104 mmol/L (ref 98–111)
Creatinine, Ser: 7.3 mg/dL — ABNORMAL HIGH (ref 0.44–1.00)
GFR, Estimated: 6 mL/min — ABNORMAL LOW (ref >60)
Glucose, Bld: 347 mg/dL — ABNORMAL HIGH (ref 70–99)
Phosphorus: 8.8 mg/dL — ABNORMAL HIGH (ref 2.5–4.6)
Potassium: 5.8 mmol/L — ABNORMAL HIGH (ref 3.5–5.1)
Sodium: 133 mmol/L — ABNORMAL LOW (ref 135–145)

## 2020-10-24 LAB — CBC
HCT: 26.8 % — ABNORMAL LOW (ref 36.0–46.0)
Hemoglobin: 8 g/dL — ABNORMAL LOW (ref 12.0–15.0)
MCH: 28.1 pg (ref 26.0–34.0)
MCHC: 29.9 g/dL — ABNORMAL LOW (ref 30.0–36.0)
MCV: 94 fL (ref 80.0–100.0)
Platelets: 182 10*3/uL (ref 150–400)
RBC: 2.85 MIL/uL — ABNORMAL LOW (ref 3.87–5.11)
RDW: 14.2 % (ref 11.5–15.5)
WBC: 5.9 10*3/uL (ref 4.0–10.5)
nRBC: 0.5 % — ABNORMAL HIGH (ref 0.0–0.2)

## 2020-10-24 LAB — SODIUM, URINE, RANDOM: Sodium, Ur: <10 mmol/L

## 2020-10-24 LAB — CK: Total CK: 1198 U/L — ABNORMAL HIGH (ref 38–234)

## 2020-10-24 LAB — CREATININE, URINE, RANDOM: Creatinine, Urine: 250.15 mg/dL

## 2020-10-24 LAB — D-DIMER, QUANTITATIVE: D-Dimer, Quant: 0.98 ug/mL-FEU — ABNORMAL HIGH (ref 0.00–0.50)

## 2020-10-24 LAB — GLUCOSE, CAPILLARY
Glucose-Capillary: 310 mg/dL — ABNORMAL HIGH (ref 70–99)
Glucose-Capillary: 345 mg/dL — ABNORMAL HIGH (ref 70–99)
Glucose-Capillary: 284 mg/dL — ABNORMAL HIGH (ref 70–99)

## 2020-10-24 LAB — C-REACTIVE PROTEIN: CRP: 18.5 mg/dL — ABNORMAL HIGH (ref <1.0)

## 2020-10-24 LAB — CBG MONITORING, ED
Glucose-Capillary: 296 mg/dL — ABNORMAL HIGH (ref 70–99)
Glucose-Capillary: 300 mg/dL — ABNORMAL HIGH (ref 70–99)

## 2020-10-24 MED ORDER — SODIUM BICARBONATE 650 MG PO TABS
1300.0000 mg | ORAL_TABLET | Freq: Two times a day (BID) | ORAL | Status: DC
  Administered 2020-10-24 – 2020-10-26 (×5): 1300 mg via ORAL
  Filled 2020-10-24 (×5): qty 2

## 2020-10-24 MED ORDER — INSULIN ASPART 100 UNIT/ML ~~LOC~~ SOLN
0.0000 [IU] | Freq: Three times a day (TID) | SUBCUTANEOUS | Status: DC
  Administered 2020-10-24 (×2): 15 [IU] via SUBCUTANEOUS
  Administered 2020-10-25: 4 [IU] via SUBCUTANEOUS
  Administered 2020-10-25: 7 [IU] via SUBCUTANEOUS
  Administered 2020-10-27: 3 [IU] via SUBCUTANEOUS

## 2020-10-24 MED ORDER — SODIUM ZIRCONIUM CYCLOSILICATE 10 G PO PACK
10.0000 g | PACK | Freq: Once | ORAL | Status: AC
  Administered 2020-10-24: 10 g via ORAL
  Filled 2020-10-24: qty 1

## 2020-10-24 MED ORDER — INSULIN ASPART 100 UNIT/ML ~~LOC~~ SOLN
0.0000 [IU] | Freq: Every day | SUBCUTANEOUS | Status: DC
  Administered 2020-10-24: 4 [IU] via SUBCUTANEOUS

## 2020-10-24 MED ORDER — SODIUM BICARBONATE 8.4 % IV SOLN
25.0000 meq | Freq: Once | INTRAVENOUS | Status: AC
  Administered 2020-10-24: 25 meq via INTRAVENOUS
  Filled 2020-10-24: qty 50

## 2020-10-24 MED ORDER — DEXTROSE 50 % IV SOLN
1.0000 | Freq: Once | INTRAVENOUS | Status: AC
  Administered 2020-10-24: 50 mL via INTRAVENOUS
  Filled 2020-10-24: qty 50

## 2020-10-24 MED ORDER — SODIUM ZIRCONIUM CYCLOSILICATE 10 G PO PACK
10.0000 g | PACK | ORAL | Status: AC
  Administered 2020-10-24: 10 g via ORAL
  Filled 2020-10-24: qty 1

## 2020-10-24 MED ORDER — SODIUM CHLORIDE 0.9 % IV BOLUS
500.0000 mL | Freq: Once | INTRAVENOUS | Status: AC
  Administered 2020-10-24: 500 mL via INTRAVENOUS

## 2020-10-24 MED ORDER — INSULIN ASPART 100 UNIT/ML ~~LOC~~ SOLN
10.0000 [IU] | Freq: Once | SUBCUTANEOUS | Status: AC
  Administered 2020-10-24: 10 [IU] via SUBCUTANEOUS

## 2020-10-24 MED ORDER — SODIUM CHLORIDE 0.9 % IV SOLN
1000.0000 mL | INTRAVENOUS | Status: DC
  Administered 2020-10-24 – 2020-10-30 (×2): 1000 mL via INTRAVENOUS

## 2020-10-24 MED ORDER — CHLORHEXIDINE GLUCONATE CLOTH 2 % EX PADS
6.0000 | MEDICATED_PAD | Freq: Every day | CUTANEOUS | Status: DC
  Administered 2020-10-25 – 2020-11-01 (×8): 6 via TOPICAL

## 2020-10-24 MED ORDER — CALCIUM GLUCONATE-NACL 1-0.675 GM/50ML-% IV SOLN
1.0000 g | Freq: Once | INTRAVENOUS | Status: AC
  Administered 2020-10-24: 1000 mg via INTRAVENOUS
  Filled 2020-10-24: qty 50

## 2020-10-24 MED ORDER — SODIUM ZIRCONIUM CYCLOSILICATE 10 G PO PACK
10.0000 g | PACK | Freq: Three times a day (TID) | ORAL | Status: DC
  Administered 2020-10-24 – 2020-10-26 (×6): 10 g via ORAL
  Filled 2020-10-24 (×6): qty 1

## 2020-10-24 NOTE — Progress Notes (Signed)
Alerted MD Avon Gully that patient unable to void. Advised that per patient she is more edematous as well. Patient was previously straight cathed in emergency room due to lack of voiding. Per Dr. Avon Gully inserted foley in order to monitor urine production.

## 2020-10-24 NOTE — Progress Notes (Signed)
Inpatient Diabetes Program Recommendations  AACE/ADA: New Consensus Statement on Inpatient Glycemic Control (2015)  Target Ranges:  Prepandial:   less than 140 mg/dL      Peak postprandial:   less than 180 mg/dL (1-2 hours)      Critically ill patients:  140 - 180 mg/dL   Results for Latasha [name], Latasha [name] (MRN [id number]) as of 10/24/2020 09:48  Ref. Range 10/23/2020 08:02 10/23/2020 11:54 10/23/2020 17:15  Glucose-Capillary Latest Ref Range: 70 - 99 mg/dL 328 (H)  11 units NOVOLOG  60 units 70/30 Insulin  321 (H)  11 units NOVOLOG  308 (H)  11 units NOVOLOG  50 units 70/30 Insulin    Results for Latasha [name], Latasha [name] (MRN [id number]) as of 10/24/2020 09:48  Ref. Range 10/24/2020 08:09  Glucose-Capillary Latest Ref Range: 70 - 99 mg/dL  10 units Novolog + 47m D50% given 6am  300 (H)    Home DM Meds: 70/30 Insulin 60 units AM/ 50 units PM  Current Orders: 70/30 Insulin 60 units AM/ 50 units PM      Novolog 0-20 units TID AC +HS    Decadron 6 mg Daily    MD- Note CBG 300 this AM.  CBGs in the 300s all day yesterday.  Please consider stopping 70/30 Insulin for now and using Levemir and Novolog in hospital (can resume 70/30 at time of d/c home)  1. Stop 70/30 Insulin  2. Start Levemir 25 units BID (0.45 units/kg)  2. Increase frequency of the Novolog SSi to qQ4 hours    --Will follow patient during hospitalization--  JWyn QuakerRN, MSN, CDE Diabetes Coordinator Inpatient Glycemic Control Team Team Pager: [phone](8a-5p)

## 2020-10-24 NOTE — ED Notes (Signed)
Please call daughter Terald Sleeper @ [phone] states her mom keeps calling her crying saying she has a pure wick but still can not pee.  Daughter would like someone to go and coach her on how to use the pure wick.  She also states her mom is cold--I told RN Karsten Ro

## 2020-10-24 NOTE — Progress Notes (Signed)
Potassium 6.2. Dr. Avon Gully alerted.
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
## 2020-10-24 NOTE — ED Notes (Signed)
Attempted to call report; RN unable to take report at this time.

## 2020-10-24 NOTE — ED Notes (Signed)
Patient paged out asking for help. Pt cannot specifically pin point what she needs help with. Lower patient's HOB. Pt states she feels better. Pt also stated she has to urinate. Bladder scanned patient. Bladder has 45m of urine in the bladder. Purewick remains in place. Urine 2572mof urine in the suction canister. Pt in NAD. Call bell within reach.

## 2020-10-24 NOTE — Progress Notes (Signed)
HOSPITAL MEDICINE OVERNIGHT EVENT NOTE    Notified by nursing that patient's chemistry this morning reveals significant hyperkalemia of 6.7.  This is associated with a rising creatinine, initially 3.95 on date of admission with gradual increasing day by day now at 6.83 with a BUN of 125.  The significant hyperkalemia and gradually progressing renal injury are despite continuous intravenous normal saline infusion at 75 cc an hour.  Stat EKG obtained.  While there is some evidence of peaked T waves and the QRS complexes are reassuring against any hyperkalemia related EKG changes.  Initiating intravenous sodium bicarbonate, intravenous dextrose, intravenous insulin, intravenous calcium gluconate and Lokelma.  Increasing intravenous fluids to 125 cc an hour.  Nursing does report the patient is exhibiting some urine output.  I have additionally asked for a post void straight cath to ensure patient is not suffering from any degree of urinary retention.  Approximately 100 cc was drained from the bladder post void.  Additionally I am ordering urine urea, urine sodium, urine creatinine and renal ultrasound.  In the absence of indications of emergent dialysis, we will notify nephrology on day shift to request their assistance in consultation.  Vernelle Emerald  MD [facility]

## 2020-10-24 NOTE — ED Notes (Signed)
Attempted to report critical potassium to admitting MD. Unable to reach MD. Will try again at a different time.

## 2020-10-24 NOTE — ED Notes (Signed)
Tele  Breakfast Ordered 

## 2020-10-24 NOTE — Progress Notes (Addendum)
PROGRESS NOTE    Latasha [name]  [id number] DOB: 1956/03/31 DOA: 10/22/2020 PCP: Shirline Frees, MD   Brief Narrative:  Latasha [name] is a 66 y.o. female with medical history significant for chronic congestive heart failure with diastolic dysfunction, chronic kidney disease stage IV, diabetes mellitus hypertension morbid obesity who presented to the emergency department this morning with 8 to 9 days of cough fever malaise generalized weakness intermittent vomiting diarrhea and shortness of breath.  She had had an appointment with her primary care doctor via telehealth who prescribed antibiotics which she completed several days ago initially she felt that the antibiotics was helping but the last few days she just started feeling worse with coughing not eating well fatigued and worsening shortness of breath.  Also worsening swelling of her legs and feet.  Stated she takes her water pill regularly.  She received 2 doses of Pfizer vaccine last one was April 2021 but has not received her booster dose.  Patient stated that her son tested positive for COVID and she has been around him.  She got worse today and decided to call EMS on arrival of the EMS they found patient with a saturation of 76% on room air and initially placed on 6 L which increased oxygenation to 87 and then she had to have a nonrebreather oxygen which increased her oxygen to 96% currently she is on room air at 5 L/min in the emergency department. In the emergency room was placed on 5 L/min with saturation of 91 to 95%.  She was found to have acute on chronic kidney disease her baseline creatinine is around 2.8 today is 3.9.  Her blood sugar was 100 she was started on remdesivir and Decadron IV in the emergency department.  Seen her in the emergency department she says she is feeling much better than when she came in.  She had a low-grade temperature of 99.8 patient was started on IV fluids in the ED as well as received 1 dose of Zofran for  nausea  Assessment & Plan:   Principal Problem:   Pneumonia due to COVID-19 virus Active Problems:   Insulin-requiring or dependent type II diabetes mellitus ([place])   Morbid obesity ([place])   Hyperlipemia   Bilateral leg edema   CKD (chronic kidney disease) stage 4, GFR 15-29 ml/min (HCC)   Acute on chronic respiratory failure with hypoxia (HCC)   Acute metabolic encephalopathy, multifactorial  -Likely in the setting of worsening kidney function and elevated uremia  -Concurrently acute hypoxic respiratory failure and COVID-19 pneumonia likely playing a small role as well  Acute hypoxic respiratory failure secondary to covid 19 pneumonia, POA SpO2: 98 % O2 Flow Rate (L/min): 5 L/min - Continue remdesivir, barcitinib, per protocol -Steroids discontinued early given hyperglycemia, mental status changes and rapidly improving COVID pneumonia and hypoxia - Early ambulation/prone/incentive spriometry/flutter as tolerated - Patient has 2 vaccines but no booster - more than 6 months since second vaccine Recent Labs    10/22/20 0945 10/22/20 0953 10/24/20 0312  DDIMER  --  1.19* 0.98*  FERRITIN  --  1,011*  --   LDH 321*  --   --   CRP  --  22.0* 18.5*   AKI on CKD4, markedly worsening overnight - Likely secondary to poor PO intake due to covid infection - Increase PO intake as tolerated/IVF per nephrology - Nephrology consulted given profound worsening in creatinine/potassium - appreciate insight/recommendations -Renal ultrasound unremarkable  Hyperkalemia, symptomatic - With notable peaked T waves  overnight on EKG- hyperkalemia protocol  - Repeat potassium downtrending but still elevated, repeat dose of Lokelma given this morning - Nephrology following as above, IV fluids ongoing -potassium approaching normal limits, no clear indication for urgent or emergent dialysis.  IDDM2 - uncontrolled with hyperglycemia - Continue to monitor closely while on steroids as above - Sliding  scale insulin/hypoglycemic protocol  Chronic anemia of CKD, stable -Continue to follow morning labs  DVT prophylaxis: Lovenox Code Status: Full Family Communication: None - patient to update  Status is: INPT  Dispo: The patient is from: home              Anticipated d/c is to: home              Anticipated d/c date is: 48-72h              Patient currently NOT medically stable for discharge  Consultants:   Nephrology  Procedures:   None  Antimicrobials:  Remdesivir   Subjective: Creatinine, potassium markedly elevated overnight, EKG overnight shows peaked T waves, patient questionably symptomatic with confusion, hyperkalemia protocol overnight, bicarb, glucose, insulin and Lokelma given. This morning review of systems somewhat limited given patient's confusion, denies nausea vomiting diarrhea constipation headache fevers or chills. She does endorse ongoing shortness of breath as well as dyspnea with exertion.  Objective: Vitals:   10/24/20 0415 10/24/20 0600 10/24/20 0800 10/24/20 0949  BP:  (!) 125/54 125/63 137/68  Pulse: (!) 55 (!) 58 (!) 57 63  Resp: '18 20 16 20  '$ Temp:    98.6 F (37 C)  TempSrc:      SpO2: 97% 96% 97% 98%   No intake or output data in the 24 hours ending 10/24/20 1412 There were no vitals filed for this visit.  Examination:  General exam: Appears calm and comfortable, ANO x3, sluggish to answer but appropriate responses Respiratory system: Clear to auscultation. Respiratory effort normal. Cardiovascular system: S1 & S2 heard, RRR. No JVD, murmurs, rubs, gallops or clicks. No pedal edema. Gastrointestinal system: Abdomen is nondistended, soft and nontender. No organomegaly or masses felt. Normal bowel sounds heard. Central nervous system: Alert and oriented. No focal neurological deficits. Extremities: Symmetric 5 x 5 power. Skin: No rashes, lesions or ulcers Psychiatry: Judgement and insight appear normal. Mood & affect appropriate.   Data  Reviewed: I have personally reviewed following labs and imaging studies  CBC: Recent Labs  Lab 10/22/20 0945 10/23/20 0941 10/24/20 0312  WBC 6.9 6.6 5.9  HGB 9.2* 8.5* 8.0*  HCT 28.6* 28.3* 26.8*  MCV 91.4 95.0 94.0  PLT 169 179 [id number]   Basic Metabolic Panel: Recent Labs  Lab 10/22/20 0945 10/23/20 0941 10/24/20 0312 10/24/20 0842  NA 137 135 131* 133*  K 5.2* 6.0* 6.7* 6.3*  CL 106 106 103 102  CO2 18* 16* 16* 16*  GLUCOSE 197* 359* 279* 328*  BUN 79* 104* 125* 127*  CREATININE 3.95* 5.50* 6.83* 7.09*  CALCIUM 7.6* 6.9* 6.5* 6.7*   GFR: CrCl cannot be calculated (Unknown ideal weight.). Liver Function Tests: Recent Labs  Lab 10/23/20 0941  AST 46*  ALT 52*  ALKPHOS 121  BILITOT 0.5  PROT 6.6  ALBUMIN 2.7*   No results for input(s): LIPASE, AMYLASE in the last 168 hours. No results for input(s): AMMONIA in the last 168 hours. Coagulation Profile: Recent Labs  Lab 10/23/20 0335  INR 1.2   Cardiac Enzymes: No results for input(s): CKTOTAL, CKMB, CKMBINDEX, TROPONINI in the last 168  hours. BNP (last 3 results) No results for input(s): PROBNP in the last 8760 hours. HbA1C: Recent Labs    10/23/20 0941  HGBA1C 7.9*   CBG: Recent Labs  Lab 10/23/20 1154 10/23/20 1715 10/24/20 0751 10/24/20 0809 10/24/20 1209  GLUCAP 321* 308* 296* 300* 310*   Lipid Profile: Recent Labs    10/22/20 0945  TRIG 76   Thyroid Function Tests: No results for input(s): TSH, T4TOTAL, FREET4, T3FREE, THYROIDAB in the last 72 hours. Anemia Panel: Recent Labs    10/22/20 0953  FERRITIN 1,011*   Sepsis Labs: Recent Labs  Lab 10/22/20 0945 10/22/20 0953 10/23/20 0335  PROCALCITON 0.81  --   --   LATICACIDVEN  --  1.0 0.6    Recent Results (from the past 240 hour(s))  Blood Culture (routine x 2)     Status: None (Preliminary result)   Collection Time: 10/22/20  9:30 AM   Specimen: BLOOD  Result Value Ref Range Status   Specimen Description BLOOD SITE NOT  SPECIFIED  Final   Special Requests   Final    BOTTLES DRAWN AEROBIC AND ANAEROBIC Blood Culture adequate volume   Culture   Final    NO GROWTH 2 DAYS Performed at [facility], [place] [address] [place], [place] [address]    Report Status PENDING  Incomplete  Blood Culture (routine x 2)     Status: None (Preliminary result)   Collection Time: 10/22/20 10:01 AM   Specimen: BLOOD RIGHT HAND  Result Value Ref Range Status   Specimen Description BLOOD RIGHT HAND  Final   Special Requests   Final    BOTTLES DRAWN AEROBIC AND ANAEROBIC Blood Culture results may not be optimal due to an inadequate volume of blood received in culture bottles   Culture   Final    NO GROWTH 2 DAYS Performed at [facility], [place] [address], [place], [place] [address]    Report Status PENDING  Incomplete     Radiology Studies: US RENAL  Result Date: 10/24/2020 CLINICAL DATA:  Renal failure EXAM: RENAL / URINARY TRACT ULTRASOUND COMPLETE COMPARISON:  Renal stone CT 05/01/2017 Ultrasound kidneys 05/16/2017 FINDINGS: Right Kidney: Renal measurements: 12.2 x 4.8 x 5.2 cm = volume: 159 mL. Echogenicity within normal limits. No mass or hydronephrosis visualized. Left Kidney: Renal measurements: 12.9 x 5.9 x 5.9 cm = volume: 229 mL. Echogenicity within normal limits. No mass or hydronephrosis visualized. Visualization of the left kidney is limited. Bladder: Not visualized.  Decompressed. Other: None. IMPRESSION: No acute abnormality of the kidneys. Electronically Signed   By: Miachel Roux M.D.   On: 10/24/2020 07:45   Scheduled Meds: . amLODipine  10 mg Oral Daily  . aspirin EC  81 mg Oral Daily  . atorvastatin  40 mg Oral Daily  . baricitinib  1 mg Oral Daily  . carvedilol  25 mg Oral BID WC  . dexamethasone  6 mg Oral Daily  . enoxaparin (LOVENOX) injection  30 mg Subcutaneous Q24H  . insulin aspart  0-20 Units Subcutaneous TID WC  . insulin aspart  0-5 Units Subcutaneous QHS  . insulin aspart  protamine- aspart  50 Units Subcutaneous Q supper  . insulin aspart protamine- aspart  60 Units Subcutaneous Q breakfast   Continuous Infusions: . sodium chloride 125 mL/hr at 10/24/20 0550  . remdesivir 100 mg in NS 100 mL 100 mg (10/24/20 1018)     LOS: 2 days    Time spent: 67mn  Little Ishikawa, DO [facility]  If 7PM-7AM, please contact night-coverage [email or web address]  10/24/2020, 2:12 PM

## 2020-10-24 NOTE — ED Notes (Signed)
Attempted to call report, RN unable to take report at this time.

## 2020-10-24 NOTE — ED Notes (Signed)
Straight cath'd patient per MD request. Pt had 170m of urine output.

## 2020-10-25 DIAGNOSIS — J1282 Pneumonia due to coronavirus disease 2019: Secondary | ICD-10-CM | POA: Diagnosis not present

## 2020-10-25 DIAGNOSIS — U071 COVID-19: Secondary | ICD-10-CM | POA: Diagnosis not present

## 2020-10-25 LAB — CBC
HCT: 25.2 % — ABNORMAL LOW (ref 36.0–46.0)
Hemoglobin: 7.9 g/dL — ABNORMAL LOW (ref 12.0–15.0)
MCH: 28.7 pg (ref 26.0–34.0)
MCHC: 31.3 g/dL (ref 30.0–36.0)
MCV: 91.6 fL (ref 80.0–100.0)
Platelets: 194 10*3/uL (ref 150–400)
RBC: 2.75 MIL/uL — ABNORMAL LOW (ref 3.87–5.11)
RDW: 14.3 % (ref 11.5–15.5)
WBC: 7.3 10*3/uL (ref 4.0–10.5)
nRBC: 1.1 % — ABNORMAL HIGH (ref 0.0–0.2)

## 2020-10-25 LAB — BASIC METABOLIC PANEL
Anion gap: 14 (ref 5–15)
Anion gap: 15 (ref 5–15)
BUN: 141 mg/dL — ABNORMAL HIGH (ref 8–23)
BUN: 152 mg/dL — ABNORMAL HIGH (ref 8–23)
CO2: 15 mmol/L — ABNORMAL LOW (ref 22–32)
CO2: 14 mmol/L — ABNORMAL LOW (ref 22–32)
Calcium: 6.5 mg/dL — ABNORMAL LOW (ref 8.9–10.3)
Calcium: 6.4 mg/dL — CL (ref 8.9–10.3)
Chloride: 105 mmol/L (ref 98–111)
Chloride: 106 mmol/L (ref 98–111)
Creatinine, Ser: 7.72 mg/dL — ABNORMAL HIGH (ref 0.44–1.00)
Creatinine, Ser: 7.98 mg/dL — ABNORMAL HIGH (ref 0.44–1.00)
GFR, Estimated: 5 mL/min — ABNORMAL LOW (ref >60)
GFR, Estimated: 5 mL/min — ABNORMAL LOW (ref >60)
Glucose, Bld: 259 mg/dL — ABNORMAL HIGH (ref 70–99)
Glucose, Bld: 165 mg/dL — ABNORMAL HIGH (ref 70–99)
Potassium: 5.7 mmol/L — ABNORMAL HIGH (ref 3.5–5.1)
Potassium: 5.6 mmol/L — ABNORMAL HIGH (ref 3.5–5.1)
Sodium: 134 mmol/L — ABNORMAL LOW (ref 135–145)
Sodium: 135 mmol/L (ref 135–145)

## 2020-10-25 LAB — GLUCOSE, CAPILLARY
Glucose-Capillary: 234 mg/dL — ABNORMAL HIGH (ref 70–99)
Glucose-Capillary: 171 mg/dL — ABNORMAL HIGH (ref 70–99)
Glucose-Capillary: 152 mg/dL — ABNORMAL HIGH (ref 70–99)
Glucose-Capillary: 175 mg/dL — ABNORMAL HIGH (ref 70–99)

## 2020-10-25 LAB — D-DIMER, QUANTITATIVE: D-Dimer, Quant: 1.1 ug/mL-FEU — ABNORMAL HIGH (ref 0.00–0.50)

## 2020-10-25 LAB — UREA NITROGEN, URINE: Urea Nitrogen, Ur: 330 mg/dL

## 2020-10-25 LAB — C-REACTIVE PROTEIN: CRP: 12.8 mg/dL — ABNORMAL HIGH (ref <1.0)

## 2020-10-25 MED ORDER — HEPARIN SODIUM (PORCINE) 10000 UNIT/ML IJ SOLN
7500.0000 [IU] | Freq: Three times a day (TID) | INTRAMUSCULAR | Status: DC
  Administered 2020-10-26 – 2020-11-01 (×16): 7500 [IU] via SUBCUTANEOUS
  Filled 2020-10-25 (×21): qty 1

## 2020-10-25 NOTE — Progress Notes (Addendum)
[facility] Progress Note   Assessment/ Plan:   1. AKI on CKD stage 4      Hyperkalemia, NAGMA     - Followed by Dr.Kruska. Last office visit (12/3), Cr 2.78 and BUN of 101     - CKD has been attributed to DM2 and HTN      - Kidney function continues to worsen, Cr 7.3 today, BUN 134. 200cc this morning in foley bag, No urine output recorded overnight.     - Acute kidney injury in setting of n/v/d with Covid-19 infection. Likely prerenal injury a potential intrinsic disease     related to Covid-19. Supported by patient appearing dry on physical exam and undetectable urine sodium. UA without     significant hematuria or features of GN.      - Given fluids overnight, but continued progressive renal failure. Orders in for CV line and Dr.Sanford to place HD orders. Patient aware .  2. Acute hypoxic respiratory failure in setting of Covid-19 Pneumonia      - Sat 94-98% on 6 L HFNC     - On Remdesivir and Barcitinib, steroids discontinued  3. Hyperkalemia      - improved overnight, 5.7      - Continue Lokelma 10g TID  4. HFpEF     - Coreg      - holding lasix 5.  Anemia of CKD       - Stable , 7.9      6. Nutrition: NPO for line placement  7. Hypertension:        - Normotensive on amlodipine 10 mg      See attending addendum for final recommendations.  Subjective:   Latasha [name] is a 65 y.o. with PMH of HTN,HLD,DM2,HFpEF, and CKD4 admitted for acute hypoxic respiratory failure 2/2 Covid-19 pneumonia on hospital day 3. Seeing in consult for AKI on CKD 4.   Patient feels worse this morning overall. She says her hands are shaky. Dr. Joelyn Oms present and discussed starting HD with patient. She is aware of plan for line placement today and HD after, all according to availability.   All questions and concerns addressed.  Objective:   BP (!) 118/47 (BP Location: Left Arm)   Pulse (!) 58   Temp 98.1 F (36.7 C) (Oral)   Resp 19   Wt 130.9 kg   SpO2 94%   BMI 48.77 kg/m    Physical Exam: Gen: ill appearing , NAD CVS:RRR, no murmurs, rubs, or gallops Resp:Effort normal, no wheezing , rales , or rhonchi [id number] sounds present, soft [id number], no edema, no deformities , asterixis   Labs: BMET Recent Labs  Lab 10/22/20 0945 10/23/20 0941 10/24/20 0312 10/24/20 0842 10/24/20 1338 10/24/20 1729 10/25/20 0551  NA 137 135 131* 133* 131* 133* 134*  K 5.2* 6.0* 6.7* 6.3* 6.0* 5.8* 5.7*  CL 106 106 103 102 102 104 105  CO2 18* 16* 16* 16* 15* 14* 15*  GLUCOSE 197* 359* 279* 328* 353* 347* 259*  BUN 79* 104* 125* 127* 131* 134* 141*  CREATININE 3.95* 5.50* 6.83* 7.09* 7.34* 7.30* 7.72*  CALCIUM 7.6* 6.9* 6.5* 6.7* 6.4* 6.2* 6.5*  PHOS  --   --   --   --   --  8.8*  --    CBC Recent Labs  Lab 10/22/20 0945 10/23/20 0941 10/24/20 0312 10/25/20 0551  WBC 6.9 6.6 5.9 7.3  HGB 9.2* 8.5* 8.0* 7.9*  HCT 28.6* 28.3* 26.8* 25.2*  MCV 91.4 95.0 94.0 91.6  PLT 169 179 182 194      Medications:    . amLODipine  10 mg Oral Daily  . aspirin EC  81 mg Oral Daily  . baricitinib  1 mg Oral Daily  . carvedilol  25 mg Oral BID WC  . Chlorhexidine Gluconate Cloth  6 each Topical Daily  . enoxaparin (LOVENOX) injection  30 mg Subcutaneous Q24H  . insulin aspart  0-20 Units Subcutaneous TID WC  . insulin aspart  0-5 Units Subcutaneous QHS  . insulin aspart protamine- aspart  50 Units Subcutaneous Q supper  . insulin aspart protamine- aspart  60 Units Subcutaneous Q breakfast  . sodium bicarbonate  1,300 mg Oral BID  . sodium zirconium cyclosilicate  10 g Oral TID     Tamsen Snider, MD PGY2 IM 10/25/2020, 7:05 AM

## 2020-10-25 NOTE — Progress Notes (Signed)
PROGRESS NOTE    Latasha [name]  [id number] DOB: Jul 20, 1956 DOA: 10/22/2020 PCP: Shirline Frees, MD   Brief Narrative:  Latasha [name] is a 65 y.o. female with medical history significant for chronic congestive heart failure with diastolic dysfunction, chronic kidney disease stage IV, diabetes mellitus hypertension morbid obesity who presented to the emergency department this morning with 8 to 9 days of cough fever malaise generalized weakness intermittent vomiting diarrhea and shortness of breath.  She had had an appointment with her primary care doctor via telehealth who prescribed antibiotics which she completed several days ago initially she felt that the antibiotics was helping but the last few days she just started feeling worse with coughing not eating well fatigued and worsening shortness of breath.  Also worsening swelling of her legs and feet.  Stated she takes her water pill regularly.  She received 2 doses of Pfizer vaccine last one was April 2021 but has not received her booster dose.  Patient stated that her son tested positive for COVID and she has been around him.  She got worse today and decided to call EMS on arrival of the EMS they found patient with a saturation of 76% on room air and initially placed on 6 L which increased oxygenation to 87 and then she had to have a nonrebreather oxygen which increased her oxygen to 96% currently she is on room air at 5 L/min in the emergency department. In the emergency room was placed on 5 L/min with saturation of 91 to 95%.  She was found to have acute on chronic kidney disease her baseline creatinine is around 2.8 today is 3.9.  Her blood sugar was 100 she was started on remdesivir and Decadron IV in the emergency department.  Seen her in the emergency department she says she is feeling much better than when she came in.  She had a low-grade temperature of 99.8 patient was started on IV fluids in the ED as well as received 1 dose of Zofran for  nausea.   Assessment & Plan:   Principal Problem:   Pneumonia due to COVID-19 virus Active Problems:   Insulin-requiring or dependent type II diabetes mellitus ([place])   Morbid obesity ([place])   Hyperlipemia   Bilateral leg edema   CKD (chronic kidney disease) stage 4, GFR 15-29 ml/min (HCC)   Acute on chronic respiratory failure with hypoxia (HCC)   Acute metabolic encephalopathy, multifactorial  - Likely in the setting of worsening kidney function and elevated uremia  - Concurrently acute hypoxic respiratory failure and COVID-19 pneumonia likely playing a small role as well  Acute hypoxic respiratory failure secondary to covid 19 pneumonia, POA SpO2: 92 % O2 Flow Rate (L/min): 2 L/min - Continue remdesivir, barcitinib, per protocol - Steroids discontinued early given hyperglycemia, mental status changes and rapidly improving COVID pneumonia and hypoxia - Early ambulation/prone/incentive spriometry/flutter as tolerated - Patient has 2 vaccines but no booster - more than 6 months since second vaccine Recent Labs    10/24/20 0312 10/25/20 0551  DDIMER 0.98* 1.10*  CRP 18.5* 12.8*   AKI on CKD4, likely ESRD now - on iHD - Likely secondary to poor PO intake due to covid infection on advanced chronic disease - Increase PO intake as tolerated/IVF per nephrology - Nephrology following appreciate insight/recommendations - Renal ultrasound unremarkable  Hyperkalemia, improving - Repeat potassium downtrending but still elevated, repeated doses of Lokelma per nephro - Initiating dialysis as above  IDDM2 - uncontrolled with hyperglycemia - Steroids  discontinued as above - Sliding scale insulin/hypoglycemic protocol - Moderately improving overnight  Chronic anemia of CKD, stable -Continue to follow morning labs  DVT prophylaxis: Lovenox Code Status: Full Family Communication: None - patient to update  Status is: INPT  Dispo: The patient is from: home               Anticipated d/c is to: home              Anticipated d/c date is: 72+ hours              Patient currently NOT medically stable for discharge  Consultants:   Nephrology  Procedures:   None  Antimicrobials:  Remdesivir   Subjective: No acute issues or events overnight, potassium downtrending, patient's confusion improving, lengthy discussion this morning about need to initiate dialysis per nephrology.  Patient is agreeable for line placement and dialysis, only complaint today is low lumbar back pain which appears to be chronic, improved with position changes(sitting upright) worse with lying supine.  Otherwise denies nausea vomiting diarrhea constipation headache fevers or chills.  Objective: Vitals:   10/24/20 2137 10/25/20 0530 10/25/20 0534 10/25/20 0800  BP: (!) 109/51 (!) 118/47    Pulse: (!) 57 (!) 58    Resp: '20 19  20  '$ Temp: 97.8 F (36.6 C) 98.1 F (36.7 C)    TempSrc: Oral Oral    SpO2: 97% 94%  92%  Weight:   130.9 kg     Intake/Output Summary (Last 24 hours) at 10/25/2020 1404 Last data filed at 10/24/2020 1648 Gross per 24 hour  Intake 620 ml  Output --  Net 620 ml   Filed Weights   10/25/20 0534  Weight: 130.9 kg    Examination:  General:  Pleasantly resting in bed, No acute distress. HEENT:  Normocephalic atraumatic.  Sclerae nonicteric, noninjected.  Extraocular movements intact bilaterally. Neck:  Without mass or deformity.  Trachea is midline. Lungs: Diminished bilaterally without rhonchi, wheeze, or rales. Heart:  Regular rate and rhythm.  Without murmurs, rubs, or gallops. Abdomen:  Soft, obese, nontender, nondistended.  Without guarding or rebound. Extremities: Without cyanosis, clubbing, edema, or obvious deformity. Vascular:  Dorsalis pedis and posterior tibial pulses palpable bilaterally. Skin:  Warm and dry, no erythema, no ulcerations.  Data Reviewed: I have personally reviewed following labs and imaging studies  CBC: Recent Labs  Lab  10/22/20 0945 10/23/20 0941 10/24/20 0312 10/25/20 0551  WBC 6.9 6.6 5.9 7.3  HGB 9.2* 8.5* 8.0* 7.9*  HCT 28.6* 28.3* 26.8* 25.2*  MCV 91.4 95.0 94.0 91.6  PLT 169 179 182 [id number]   Basic Metabolic Panel: Recent Labs  Lab 10/24/20 0312 10/24/20 0842 10/24/20 1338 10/24/20 1729 10/25/20 0551  NA 131* 133* 131* 133* 134*  K 6.7* 6.3* 6.0* 5.8* 5.7*  CL 103 102 102 104 105  CO2 16* 16* 15* 14* 15*  GLUCOSE 279* 328* 353* 347* 259*  BUN 125* 127* 131* 134* 141*  CREATININE 6.83* 7.09* 7.34* 7.30* 7.72*  CALCIUM 6.5* 6.7* 6.4* 6.2* 6.5*  PHOS  --   --   --  8.8*  --    GFR: CrCl cannot be calculated (Unknown ideal weight.). Liver Function Tests: Recent Labs  Lab 10/23/20 0941 10/24/20 1729  AST 46*  --   ALT 52*  --   ALKPHOS 121  --   BILITOT 0.5  --   PROT 6.6  --   ALBUMIN 2.7* 2.3*   No results for input(s):  LIPASE, AMYLASE in the last 168 hours. No results for input(s): AMMONIA in the last 168 hours. Coagulation Profile: Recent Labs  Lab 10/23/20 0335  INR 1.2   Cardiac Enzymes: Recent Labs  Lab 10/24/20 1338  CKTOTAL 1,198*   BNP (last 3 results) No results for input(s): PROBNP in the last 8760 hours. HbA1C: Recent Labs    10/23/20 0941  HGBA1C 7.9*   CBG: Recent Labs  Lab 10/24/20 1209 10/24/20 1641 10/24/20 2137 10/25/20 0810 10/25/20 1226  GLUCAP 310* 345* 284* 234* 171*   Lipid Profile: No results for input(s): CHOL, HDL, LDLCALC, TRIG, CHOLHDL, LDLDIRECT in the last 72 hours. Thyroid Function Tests: No results for input(s): TSH, T4TOTAL, FREET4, T3FREE, THYROIDAB in the last 72 hours. Anemia Panel: No results for input(s): VITAMINB12, FOLATE, FERRITIN, TIBC, IRON, RETICCTPCT in the last 72 hours. Sepsis Labs: Recent Labs  Lab 10/22/20 0945 10/22/20 0953 10/23/20 0335  PROCALCITON 0.81  --   --   LATICACIDVEN  --  1.0 0.6    Recent Results (from the past 240 hour(s))  Blood Culture (routine x 2)     Status: None  (Preliminary result)   Collection Time: 10/22/20  9:30 AM   Specimen: BLOOD  Result Value Ref Range Status   Specimen Description BLOOD SITE NOT SPECIFIED  Final   Special Requests   Final    BOTTLES DRAWN AEROBIC AND ANAEROBIC Blood Culture adequate volume   Culture   Final    NO GROWTH 3 DAYS Performed at [facility], [address], [place], [place] [address]    Report Status PENDING  Incomplete  Blood Culture (routine x 2)     Status: None (Preliminary result)   Collection Time: 10/22/20 10:01 AM   Specimen: BLOOD RIGHT HAND  Result Value Ref Range Status   Specimen Description BLOOD RIGHT HAND  Final   Special Requests   Final    BOTTLES DRAWN AEROBIC AND ANAEROBIC Blood Culture results may not be optimal due to an inadequate volume of blood received in culture bottles   Culture   Final    NO GROWTH 3 DAYS Performed at [facility], [place] [address], [place], [place] [address]    Report Status PENDING  Incomplete     Radiology Studies: US RENAL  Result Date: 10/24/2020 CLINICAL DATA:  Renal failure EXAM: RENAL / URINARY TRACT ULTRASOUND COMPLETE COMPARISON:  Renal stone CT 05/01/2017 Ultrasound kidneys 05/16/2017 FINDINGS: Right Kidney: Renal measurements: 12.2 x 4.8 x 5.2 cm = volume: 159 mL. Echogenicity within normal limits. No mass or hydronephrosis visualized. Left Kidney: Renal measurements: 12.9 x 5.9 x 5.9 cm = volume: 229 mL. Echogenicity within normal limits. No mass or hydronephrosis visualized. Visualization of the left kidney is limited. Bladder: Not visualized.  Decompressed. Other: None. IMPRESSION: No acute abnormality of the kidneys. Electronically Signed   By: Miachel Roux M.D.   On: 10/24/2020 07:45   Scheduled Meds: . amLODipine  10 mg Oral Daily  . aspirin EC  81 mg Oral Daily  . carvedilol  25 mg Oral BID WC  . Chlorhexidine Gluconate Cloth  6 each Topical Daily  . [START ON 10/26/2020] heparin injection (subcutaneous)  7,500 Units Subcutaneous  Q8H  . insulin aspart  0-20 Units Subcutaneous TID WC  . insulin aspart  0-5 Units Subcutaneous QHS  . insulin aspart protamine- aspart  50 Units Subcutaneous Q supper  . insulin aspart protamine- aspart  60 Units Subcutaneous Q breakfast  .  sodium bicarbonate  1,300 mg Oral BID  . sodium zirconium cyclosilicate  10 g Oral TID   Continuous Infusions: . sodium chloride 1,000 mL (10/24/20 1648)  . remdesivir 100 mg in NS 100 mL 100 mg (10/25/20 0840)     LOS: 3 days    Time spent: 73mn  Alazar Cherian C Woodfin Kiss, DO [facility]  If 7PM-7AM, please contact night-coverage [email or web address]  10/25/2020, 2:04 PM

## 2020-10-26 ENCOUNTER — Inpatient Hospital Stay (HOSPITAL_COMMUNITY): Payer: Medicare HMO

## 2020-10-26 DIAGNOSIS — U071 COVID-19: Secondary | ICD-10-CM | POA: Diagnosis not present

## 2020-10-26 DIAGNOSIS — J1282 Pneumonia due to coronavirus disease 2019: Secondary | ICD-10-CM | POA: Diagnosis not present

## 2020-10-26 HISTORY — PX: IR FLUORO GUIDE CV LINE RIGHT: IMG2283

## 2020-10-26 HISTORY — PX: IR US GUIDE VASC ACCESS RIGHT: IMG2390

## 2020-10-26 LAB — RENAL FUNCTION PANEL
Albumin: 2.6 g/dL — ABNORMAL LOW (ref 3.5–5.0)
Anion gap: 18 — ABNORMAL HIGH (ref 5–15)
BUN: 161 mg/dL — ABNORMAL HIGH (ref 8–23)
CO2: 14 mmol/L — ABNORMAL LOW (ref 22–32)
Calcium: 6.6 mg/dL — ABNORMAL LOW (ref 8.9–10.3)
Chloride: 106 mmol/L (ref 98–111)
Creatinine, Ser: 8.22 mg/dL — ABNORMAL HIGH (ref 0.44–1.00)
GFR, Estimated: 5 mL/min — ABNORMAL LOW (ref >60)
Glucose, Bld: 126 mg/dL — ABNORMAL HIGH (ref 70–99)
Phosphorus: 10.4 mg/dL — ABNORMAL HIGH (ref 2.5–4.6)
Potassium: 5.2 mmol/L — ABNORMAL HIGH (ref 3.5–5.1)
Sodium: 138 mmol/L (ref 135–145)

## 2020-10-26 LAB — GLUCOSE, CAPILLARY
Glucose-Capillary: 109 mg/dL — ABNORMAL HIGH (ref 70–99)
Glucose-Capillary: 116 mg/dL — ABNORMAL HIGH (ref 70–99)
Glucose-Capillary: 84 mg/dL (ref 70–99)
Glucose-Capillary: 51 mg/dL — ABNORMAL LOW (ref 70–99)
Glucose-Capillary: 55 mg/dL — ABNORMAL LOW (ref 70–99)
Glucose-Capillary: 104 mg/dL — ABNORMAL HIGH (ref 70–99)

## 2020-10-26 LAB — CBC
HCT: 26.2 % — ABNORMAL LOW (ref 36.0–46.0)
Hemoglobin: 8 g/dL — ABNORMAL LOW (ref 12.0–15.0)
MCH: 28.1 pg (ref 26.0–34.0)
MCHC: 30.5 g/dL (ref 30.0–36.0)
MCV: 91.9 fL (ref 80.0–100.0)
Platelets: 216 10*3/uL (ref 150–400)
RBC: 2.85 MIL/uL — ABNORMAL LOW (ref 3.87–5.11)
RDW: 14.3 % (ref 11.5–15.5)
WBC: 8.6 10*3/uL (ref 4.0–10.5)
nRBC: 0.7 % — ABNORMAL HIGH (ref 0.0–0.2)

## 2020-10-26 LAB — D-DIMER, QUANTITATIVE: D-Dimer, Quant: 2.05 ug/mL-FEU — ABNORMAL HIGH (ref 0.00–0.50)

## 2020-10-26 LAB — HEPATITIS B SURFACE ANTIGEN: Hepatitis B Surface Ag: NONREACTIVE

## 2020-10-26 LAB — HEPATITIS B CORE ANTIBODY, IGM: Hep B C IgM: NONREACTIVE

## 2020-10-26 LAB — C-REACTIVE PROTEIN: CRP: 9.4 mg/dL — ABNORMAL HIGH (ref <1.0)

## 2020-10-26 MED ORDER — SODIUM CHLORIDE 0.9 % IV SOLN: 100.0000 mL | INTRAVENOUS | Status: DC | PRN

## 2020-10-26 MED ORDER — CYCLOSPORINE 0.05 % OP EMUL
1.0000 [drp] | Freq: Two times a day (BID) | OPHTHALMIC | Status: DC
  Administered 2020-10-27 – 2020-10-31 (×7): 1 [drp] via OPHTHALMIC
  Filled 2020-10-26 (×13): qty 1

## 2020-10-26 MED ORDER — GELATIN ABSORBABLE 12-7 MM EX MISC
CUTANEOUS | Status: AC
  Filled 2020-10-26: qty 1

## 2020-10-26 MED ORDER — ALTEPLASE 2 MG IJ SOLR: 2.0000 mg | Freq: Once | INTRAMUSCULAR | Status: DC | PRN

## 2020-10-26 MED ORDER — HEPARIN SODIUM (PORCINE) 1000 UNIT/ML IJ SOLN
INTRAMUSCULAR | Status: AC
  Filled 2020-10-26: qty 1

## 2020-10-26 MED ORDER — CEFAZOLIN SODIUM-DEXTROSE 2-4 GM/100ML-% IV SOLN
2.0000 g | INTRAVENOUS | Status: AC
  Filled 2020-10-26: qty 100

## 2020-10-26 MED ORDER — DEXTROSE 50 % IV SOLN
INTRAVENOUS | Status: AC
  Filled 2020-10-26: qty 50

## 2020-10-26 MED ORDER — SODIUM CHLORIDE 0.9 % IV SOLN
INTRAVENOUS | Status: AC | PRN
  Administered 2020-10-26: 10 mL/h via INTRAVENOUS

## 2020-10-26 MED ORDER — DEXTROSE 50 % IV SOLN
INTRAVENOUS | Status: AC
  Administered 2020-10-26: 25 mL
  Filled 2020-10-26: qty 50

## 2020-10-26 MED ORDER — LIDOCAINE HCL 1 % IJ SOLN
INTRAMUSCULAR | Status: AC
  Filled 2020-10-26: qty 20

## 2020-10-26 MED ORDER — CEFAZOLIN SODIUM-DEXTROSE 2-4 GM/100ML-% IV SOLN
INTRAVENOUS | Status: AC
  Administered 2020-10-26: 2 g via INTRAVENOUS
  Filled 2020-10-26: qty 100

## 2020-10-26 MED ORDER — HEPARIN SODIUM (PORCINE) 1000 UNIT/ML DIALYSIS: 20.0000 [IU]/kg | INTRAMUSCULAR | Status: DC | PRN

## 2020-10-26 MED ORDER — HEPARIN SODIUM (PORCINE) 1000 UNIT/ML DIALYSIS
1000.0000 [IU] | INTRAMUSCULAR | Status: DC | PRN
  Administered 2020-10-26: 1000 [IU] via INTRAVENOUS_CENTRAL

## 2020-10-26 MED ORDER — MIDAZOLAM HCL 2 MG/2ML IJ SOLN
INTRAMUSCULAR | Status: AC | PRN
  Administered 2020-10-26: 1 mg via INTRAVENOUS

## 2020-10-26 MED ORDER — FENTANYL CITRATE (PF) 100 MCG/2ML IJ SOLN
INTRAMUSCULAR | Status: AC | PRN
  Administered 2020-10-26: 50 ug via INTRAVENOUS

## 2020-10-26 MED ORDER — MIDAZOLAM HCL 2 MG/2ML IJ SOLN
INTRAMUSCULAR | Status: AC
  Filled 2020-10-26: qty 2

## 2020-10-26 MED ORDER — LIDOCAINE HCL (PF) 1 % IJ SOLN
INTRAMUSCULAR | Status: AC | PRN
  Administered 2020-10-26: 15 mL

## 2020-10-26 MED ORDER — FENTANYL CITRATE (PF) 100 MCG/2ML IJ SOLN
INTRAMUSCULAR | Status: AC
  Filled 2020-10-26: qty 2

## 2020-10-26 NOTE — Progress Notes (Signed)
Renal Navigator aware of patient's need for outpatient HD referral for ESRD treatment, however, we are still evaluating where to place patient at this time as outpatient HD seats are unfortunately extremely limited at this time. Patient's COVID positive status is adding to difficult placement. Navigator working on referral and will update as information is available.   Alphonzo Cruise, [facility]igator [phone]

## 2020-10-26 NOTE — Procedures (Signed)
I was present at this dialysis session. I have reviewed the session itself and made appropriate changes.   HD#1. 2K, 1L UF, 2.5h. Tolerating well. Tentative next HD either tomorrow on COVID shift if spot available or on Saturday.     Filed Weights   10/25/20 0534 10/26/20 0528 10/26/20 1345  Weight: 130.9 kg 128.7 kg 133.3 kg    Recent Labs  Lab 10/26/20 0805  NA 138  K 5.2*  CL 106  CO2 14*  GLUCOSE 126*  BUN 161*  CREATININE 8.22*  CALCIUM 6.6*  PHOS 10.4*    Recent Labs  Lab 10/24/20 0312 10/25/20 0551 10/26/20 0718  WBC 5.9 7.3 8.6  HGB 8.0* 7.9* 8.0*  HCT 26.8* 25.2* 26.2*  MCV 94.0 91.6 91.9  PLT 182 194 216    Scheduled Meds: . amLODipine  10 mg Oral Daily  . aspirin EC  81 mg Oral Daily  . carvedilol  25 mg Oral BID WC  . Chlorhexidine Gluconate Cloth  6 each Topical Daily  . gelatin adsorbable      . heparin injection (subcutaneous)  7,500 Units Subcutaneous Q8H  . heparin sodium (porcine)      . insulin aspart  0-20 Units Subcutaneous TID WC  . insulin aspart  0-5 Units Subcutaneous QHS  . insulin aspart protamine- aspart  50 Units Subcutaneous Q supper  . insulin aspart protamine- aspart  60 Units Subcutaneous Q breakfast  . lidocaine       Continuous Infusions: . sodium chloride 1,000 mL (10/24/20 1648)  . sodium chloride    . sodium chloride     PRN Meds:.sodium chloride, sodium chloride, acetaminophen **OR** acetaminophen, albuterol, alteplase, heparin, heparin, nitroGLYCERIN   Pearson Grippe  MD 10/26/2020, 3:18 PM

## 2020-10-26 NOTE — Progress Notes (Signed)
[facility] Progress Note   Assessment/ Plan:   1. AKI on CKD stage 4      Hyperkalemia, NAGMA     - Followed by Dr.Kruska. Last office visit (12/3), Cr 2.78 and BUN of 101     - CKD has been attributed to DM2 and HTN      - CKD 4 has progressed in setting of n/v/d with Covid-19 infection. Supported by patient appearing dry on physical  exam and                  undetectable urine sodium. UA without  significant hematuria or features of GN. Patients kidney function       did not improve with IV fluids.       - Patient had tunneled cath placed today and currently undergoing HD#1       - discontinued Sodium Bicarbonate     2. Acute hypoxic respiratory failure in setting of Covid-19 Pneumonia      - Improving supplemental oxygen requirement, Sat 92-94% on 2 L HFNC     - Day 5 of Remdesivir and Barcitinib, steroids discontinued after 3 days for hyperglycemia  3. Hyperkalemia      - improved 5.2 , HD #1 today       - discontinued Lokelma   4. HFpEF     - Coreg      - holding lasix  5.  Anemia of CKD       - Stable    6. CK-MBD : Ca Corrects to 7.7, Phos 10.4  7. Hypertension:        - Normotensive on amlodipine 10 mg   8. Nutrition: Renal/Carb Modified     See attending addendum for final recommendations.  Subjective:   Latasha [name] is a 65 y.o. with PMH of HTN,HLD,DM2,HFpEF, and CKD4 admitted for acute hypoxic respiratory failure 2/2 Covid-19 pneumonia on hospital day 4. Seeing in consult for AKI on CKD 4.   Patient currently in HD. Had increase in edema overnight, but no other acute concerns.     Objective:   BP (!) 119/59 (BP Location: Left Wrist)   Pulse 62   Temp 98.5 F (36.9 C) (Oral)   Resp 18   Wt 130.9 kg   SpO2 94%   BMI 48.77 kg/m   Physical Exam: Gen: chronically ill appearing , NAD CVS:RRR, no murmurs, rubs, or gallops.  Resp:Effort normal, no wheezing , rales , or rhonchi [id number] sounds present, soft [id number], no edema, no  deformities , asterixis   Labs: BMET Recent Labs  Lab 10/23/20 0941 10/24/20 0312 10/24/20 0842 10/24/20 1338 10/24/20 1729 10/25/20 0551 10/25/20 1739  NA 135 131* 133* 131* 133* 134* 135  K 6.0* 6.7* 6.3* 6.0* 5.8* 5.7* 5.6*  CL 106 103 102 102 104 105 106  CO2 16* 16* 16* 15* 14* 15* 14*  GLUCOSE 359* 279* 328* 353* 347* 259* 165*  BUN 104* 125* 127* 131* 134* 141* 152*  CREATININE 5.50* 6.83* 7.09* 7.34* 7.30* 7.72* 7.98*  CALCIUM 6.9* 6.5* 6.7* 6.4* 6.2* 6.5* 6.4*  PHOS  --   --   --   --  8.8*  --   --    CBC Recent Labs  Lab 10/22/20 0945 10/23/20 0941 10/24/20 0312 10/25/20 0551  WBC 6.9 6.6 5.9 7.3  HGB 9.2* 8.5* 8.0* 7.9*  HCT 28.6* 28.3* 26.8* 25.2*  MCV 91.4 95.0 94.0 91.6  PLT 169 179 182 194  Medications:    . amLODipine  10 mg Oral Daily  . aspirin EC  81 mg Oral Daily  . carvedilol  25 mg Oral BID WC  . Chlorhexidine Gluconate Cloth  6 each Topical Daily  . heparin injection (subcutaneous)  7,500 Units Subcutaneous Q8H  . insulin aspart  0-20 Units Subcutaneous TID WC  . insulin aspart  0-5 Units Subcutaneous QHS  . insulin aspart protamine- aspart  50 Units Subcutaneous Q supper  . insulin aspart protamine- aspart  60 Units Subcutaneous Q breakfast  . sodium bicarbonate  1,300 mg Oral BID  . sodium zirconium cyclosilicate  10 g Oral TID     Tamsen Snider, MD PGY2 IM 10/26/2020, 5:58 AM

## 2020-10-26 NOTE — Progress Notes (Signed)
PROGRESS NOTE    [name]  [id number] DOB: 1955-12-22 DOA: 10/22/2020 PCP: Shirline Frees, MD   Brief Narrative:  Latasha [name] is a 65 y.o. female with medical history significant for chronic congestive heart failure with diastolic dysfunction, chronic kidney disease stage IV, diabetes mellitus hypertension morbid obesity who presented to the emergency department this morning with 8 to 9 days of cough fever malaise generalized weakness intermittent vomiting diarrhea and shortness of breath.  She had had an appointment with her primary care doctor via telehealth who prescribed antibiotics which she completed several days ago initially she felt that the antibiotics was helping but the last few days she just started feeling worse with coughing not eating well fatigued and worsening shortness of breath.  Also worsening swelling of her legs and feet.  Stated she takes her water pill regularly.  She received 2 doses of Pfizer vaccine last one was April 2021 but has not received her booster dose.  Patient stated that her son tested positive for COVID and she has been around him.  She got worse today and decided to call EMS on arrival of the EMS they found patient with a saturation of 76% on room air and initially placed on 6 L which increased oxygenation to 87 and then she had to have a nonrebreather oxygen which increased her oxygen to 96% currently she is on room air at 5 L/min in the emergency department. In the emergency room was placed on 5 L/min with saturation of 91 to 95%.  She was found to have acute on chronic kidney disease her baseline creatinine is around 2.8 today is 3.9.  Her blood sugar was 100 she was started on remdesivir and Decadron IV in the emergency department.  Seen her in the emergency department she says she is feeling much better than when she came in.  She had a low-grade temperature of 99.8 patient was started on IV fluids in the ED as well as received 1 dose of Zofran for  nausea.  Assessment & Plan:   Principal Problem:   Pneumonia due to COVID-19 virus Active Problems:   Insulin-requiring or dependent type II diabetes mellitus ([place])   Morbid obesity ([place])   Hyperlipemia   Bilateral leg edema   CKD (chronic kidney disease) stage 4, GFR 15-29 ml/min (HCC)   Acute on chronic respiratory failure with hypoxia (H[facility])   Acute metabolic encephalopathy, multifactorial  - Likely in the setting of worsening kidney function and elevated uremia  - Concurrently acute hypoxic respiratory failure and COVID-19 pneumonia likely playing a small role as well  Acute hypoxic respiratory failure secondary to covid 19 pneumonia, POA SpO2: 96 % O2 Flow Rate (L/min): 2 L/min - Continue remdesivir, barcitinib, per protocol - Steroids discontinued early given hyperglycemia, mental status changes and rapidly improving COVID pneumonia and hypoxia - Early ambulation/prone/incentive spriometry/flutter as tolerated - Patient has 2 vaccines but no booster - more than 6 months since second vaccine Recent Labs    10/24/20 0312 10/25/20 0551 10/26/20 0718  DDIMER 0.98* 1.10* 2.05*  CRP 18.5* 12.8* 9.4*   AKI on CKD4, likely ESRD now - on iHD - Likely secondary to poor PO intake due to covid infection on advanced chronic disease - Increase PO intake as tolerated/IVF per nephrology - Nephrology following appreciate insight/recommendations - Renal ultrasound unremarkable  Hyperkalemia, improving - Repeat potassium downtrending but still elevated, repeated doses of Lokelma per nephro - Initiating dialysis as above  IDDM2 - uncontrolled with  hyperglycemia - Steroids discontinued as above - Sliding scale insulin/hypoglycemic protocol - Moderately improving overnight  Chronic anemia of CKD, stable -Continue to follow morning labs  DVT prophylaxis: Lovenox Code Status: Full Family Communication: None - patient to update  Status is: INPT  Dispo: The patient is from:  home              Anticipated d/c is to: home              Anticipated d/c date is: 72+ hours              Patient currently NOT medically stable for discharge  Consultants:   Nephrology  Procedures:   None  Antimicrobials:  Remdesivir   Subjective: No acute issues or events overnight, potassium downtrending, patient's confusion minimally improving, lengthy discussion about procedure/dialysis this am. Back pain which appears to be chronic, improved with position changes(sitting upright) worse with lying supine.  Otherwise denies nausea vomiting diarrhea constipation headache fevers or chills.  Objective: Vitals:   10/26/20 1017 10/26/20 1345 10/26/20 1350 10/26/20 1400  BP: (!) 109/50 (!) 104/50 (!) 99/52 (!) 114/55  Pulse: (!) 58 (!) 54 (!) 55 (!) 56  Resp: 17     Temp:  (!) 97.5 F (36.4 C)    TempSrc:  Axillary    SpO2: 96% 96%    Weight:  133.3 kg      Intake/Output Summary (Last 24 hours) at 10/26/2020 1416 Last data filed at 10/25/2020 1645 Gross per 24 hour  Intake 0 ml  Output --  Net 0 ml   Filed Weights   10/25/20 0534 10/26/20 0528 10/26/20 1345  Weight: 130.9 kg 128.7 kg 133.3 kg    Examination:  General:  Pleasantly resting in bed, No acute distress. HEENT:  Normocephalic atraumatic.  Sclerae nonicteric, noninjected.  Extraocular movements intact bilaterally. Neck:  Without mass or deformity.  Trachea is midline. Lungs: Diminished bilaterally without rhonchi, wheeze, or rales. Heart:  Regular rate and rhythm.  Without murmurs, rubs, or gallops. Abdomen:  Soft, obese, nontender, nondistended.  Without guarding or rebound. Extremities: Without cyanosis, clubbing, edema, or obvious deformity. Vascular:  Dorsalis pedis and posterior tibial pulses palpable bilaterally. Skin:  Warm and dry, no erythema, no ulcerations.  Data Reviewed: I have personally reviewed following labs and imaging studies  CBC: Recent Labs  Lab 10/22/20 0945 10/23/20 0941  10/24/20 0312 10/25/20 0551 10/26/20 0718  WBC 6.9 6.6 5.9 7.3 8.6  HGB 9.2* 8.5* 8.0* 7.9* 8.0*  HCT 28.6* 28.3* 26.8* 25.2* 26.2*  MCV 91.4 95.0 94.0 91.6 91.9  PLT 169 179 182 194 [id number]   Basic Metabolic Panel: Recent Labs  Lab 10/24/20 1338 10/24/20 1729 10/25/20 0551 10/25/20 1739 10/26/20 0805  NA 131* 133* 134* 135 138  K 6.0* 5.8* 5.7* 5.6* 5.2*  CL 102 104 105 106 106  CO2 15* 14* 15* 14* 14*  GLUCOSE 353* 347* 259* 165* 126*  BUN 131* 134* 141* 152* 161*  CREATININE 7.34* 7.30* 7.72* 7.98* 8.22*  CALCIUM 6.4* 6.2* 6.5* 6.4* 6.6*  PHOS  --  8.8*  --   --  10.4*   GFR: CrCl cannot be calculated (Unknown ideal weight.). Liver Function Tests: Recent Labs  Lab 10/23/20 0941 10/24/20 1729 10/26/20 0805  AST 46*  --   --   ALT 52*  --   --   ALKPHOS 121  --   --   BILITOT 0.5  --   --   PROT 6.6  --   --  ALBUMIN 2.7* 2.3* 2.6*   No results for input(s): LIPASE, AMYLASE in the last 168 hours. No results for input(s): AMMONIA in the last 168 hours. Coagulation Profile: Recent Labs  Lab 10/23/20 0335  INR 1.2   Cardiac Enzymes: Recent Labs  Lab 10/24/20 1338  CKTOTAL 1,198*   BNP (last 3 results) No results for input(s): PROBNP in the last 8760 hours. HbA1C: No results for input(s): HGBA1C in the last 72 hours. CBG: Recent Labs  Lab 10/25/20 1226 10/25/20 1653 10/25/20 2057 10/26/20 0820 10/26/20 1150  GLUCAP 171* 152* 175* 109* 116*   Lipid Profile: No results for input(s): CHOL, HDL, LDLCALC, TRIG, CHOLHDL, LDLDIRECT in the last 72 hours. Thyroid Function Tests: No results for input(s): TSH, T4TOTAL, FREET4, T3FREE, THYROIDAB in the last 72 hours. Anemia Panel: No results for input(s): VITAMINB12, FOLATE, FERRITIN, TIBC, IRON, RETICCTPCT in the last 72 hours. Sepsis Labs: Recent Labs  Lab 10/22/20 0945 10/22/20 0953 10/23/20 0335  PROCALCITON 0.81  --   --   LATICACIDVEN  --  1.0 0.6    Recent Results (from the past 240 hour(s))   Blood Culture (routine x 2)     Status: None (Preliminary result)   Collection Time: 10/22/20  9:30 AM   Specimen: BLOOD  Result Value Ref Range Status   Specimen Description BLOOD SITE NOT SPECIFIED  Final   Special Requests   Final    BOTTLES DRAWN AEROBIC AND ANAEROBIC Blood Culture adequate volume   Culture   Final    NO GROWTH 4 DAYS Performed at [facility], [address] [place], [place] [address]    Report Status PENDING  Incomplete  Blood Culture (routine x 2)     Status: None (Preliminary result)   Collection Time: 10/22/20 10:01 AM   Specimen: BLOOD RIGHT HAND  Result Value Ref Range Status   Specimen Description BLOOD RIGHT HAND  Final   Special Requests   Final    BOTTLES DRAWN AEROBIC AND ANAEROBIC Blood Culture results may not be optimal due to an inadequate volume of blood received in culture bottles   Culture   Final    NO GROWTH 4 DAYS Performed at [facility], [place] [address] [place], [place] [address]    Report Status PENDING  Incomplete     Radiology Studies: IR Fluoro Guide CV Line Right  Result Date: 10/26/2020 INDICATION: 65 year old with end-stage renal disease and COVID-19. Patient needs access for hemodialysis. EXAM: FLUOROSCOPIC AND ULTRASOUND GUIDED PLACEMENT OF A TUNNELED DIALYSIS CATHETER Physician: Stephan Minister. Anselm Pancoast, MD MEDICATIONS: Ancef 2 g; The antibiotic was administered within an appropriate time interval prior to skin puncture. ANESTHESIA/SEDATION: Versed 1.0 mg IV; Fentanyl 50 mcg IV; Moderate Sedation Time:  32 minutes The patient was continuously monitored during the procedure by the interventional radiology nurse under my direct supervision. FLUOROSCOPY TIME:  Fluoroscopy Time: 24 seconds, 14 mGy COMPLICATIONS: None immediate. PROCEDURE: The procedure was explained to the patient. The risks and benefits of the procedure were discussed and the patient's questions were addressed. Informed consent was obtained from the patient. The  patient was placed supine on the interventional table. Ultrasound confirmed a patent right internal jugular vein. Ultrasound images were obtained for documentation. The right neck and chest was prepped and draped in a sterile fashion. The right neck was anesthetized with 1% lidocaine. Maximal barrier sterile technique was utilized including caps, mask, sterile gowns, sterile gloves, sterile drape, hand hygiene and skin antiseptic. A small incision  was made with #11 blade scalpel. A 21 gauge needle directed into the right internal jugular vein with ultrasound guidance. A micropuncture dilator set was placed. A 23 cm tip to cuff Palindrome catheter was selected. The skin below the right clavicle was anesthetized and a small incision was made with an #11 blade scalpel. A subcutaneous tunnel was formed to the vein dermatotomy site. The catheter was brought through the tunnel. The vein dermatotomy site was dilated to accommodate a peel-away sheath. The catheter was placed through the peel-away sheath and directed into the central venous structures. The tip of the catheter was placed in the upper right atrium with fluoroscopy. Fluoroscopic images were obtained for documentation. Both lumens were found to aspirate and flush well. The proper amount of heparin was flushed in both lumens. The vein dermatotomy site was closed using a single layer of absorbable suture and Dermabond. Gel-Foam was placed in subcutaneous tract. The catheter was secured to the skin using Prolene suture. IMPRESSION: Successful placement of a right jugular tunneled dialysis catheter using ultrasound and fluoroscopic guidance. Electronically Signed   By: Markus Daft M.D.   On: 10/26/2020 10:41   IR US Guide Vasc Access Right  Result Date: 10/26/2020 INDICATION: 65 year old with end-stage renal disease and COVID-19. Patient needs access for hemodialysis. EXAM: FLUOROSCOPIC AND ULTRASOUND GUIDED PLACEMENT OF A TUNNELED DIALYSIS CATHETER Physician:  Stephan Minister. Anselm Pancoast, MD MEDICATIONS: Ancef 2 g; The antibiotic was administered within an appropriate time interval prior to skin puncture. ANESTHESIA/SEDATION: Versed 1.0 mg IV; Fentanyl 50 mcg IV; Moderate Sedation Time:  32 minutes The patient was continuously monitored during the procedure by the interventional radiology nurse under my direct supervision. FLUOROSCOPY TIME:  Fluoroscopy Time: 24 seconds, 14 mGy COMPLICATIONS: None immediate. PROCEDURE: The procedure was explained to the patient. The risks and benefits of the procedure were discussed and the patient's questions were addressed. Informed consent was obtained from the patient. The patient was placed supine on the interventional table. Ultrasound confirmed a patent right internal jugular vein. Ultrasound images were obtained for documentation. The right neck and chest was prepped and draped in a sterile fashion. The right neck was anesthetized with 1% lidocaine. Maximal barrier sterile technique was utilized including caps, mask, sterile gowns, sterile gloves, sterile drape, hand hygiene and skin antiseptic. A small incision was made with #11 blade scalpel. A 21 gauge needle directed into the right internal jugular vein with ultrasound guidance. A micropuncture dilator set was placed. A 23 cm tip to cuff Palindrome catheter was selected. The skin below the right clavicle was anesthetized and a small incision was made with an #11 blade scalpel. A subcutaneous tunnel was formed to the vein dermatotomy site. The catheter was brought through the tunnel. The vein dermatotomy site was dilated to accommodate a peel-away sheath. The catheter was placed through the peel-away sheath and directed into the central venous structures. The tip of the catheter was placed in the upper right atrium with fluoroscopy. Fluoroscopic images were obtained for documentation. Both lumens were found to aspirate and flush well. The proper amount of heparin was flushed in both lumens.  The vein dermatotomy site was closed using a single layer of absorbable suture and Dermabond. Gel-Foam was placed in subcutaneous tract. The catheter was secured to the skin using Prolene suture. IMPRESSION: Successful placement of a right jugular tunneled dialysis catheter using ultrasound and fluoroscopic guidance. Electronically Signed   By: Markus Daft M.D.   On: 10/26/2020 10:41   Scheduled Meds: .  amLODipine  10 mg Oral Daily  . aspirin EC  81 mg Oral Daily  . carvedilol  25 mg Oral BID WC  . Chlorhexidine Gluconate Cloth  6 each Topical Daily  . gelatin adsorbable      . heparin injection (subcutaneous)  7,500 Units Subcutaneous Q8H  . heparin sodium (porcine)      . insulin aspart  0-20 Units Subcutaneous TID WC  . insulin aspart  0-5 Units Subcutaneous QHS  . insulin aspart protamine- aspart  50 Units Subcutaneous Q supper  . insulin aspart protamine- aspart  60 Units Subcutaneous Q breakfast  . lidocaine       Continuous Infusions: . sodium chloride 1,000 mL (10/24/20 1648)     LOS: 4 days    Time spent: 71mn  Claryce Friel C Garrison Michie, DO [facility]  If 7PM-7AM, please contact night-coverage [email or web address]  10/26/2020, 2:16 PM

## 2020-10-26 NOTE — Consult Note (Signed)
Chief Complaint: Patient was seen in consultation today for  Tunneled dialysis catheter  Chief Complaint  Patient presents with  . Covid Exposure  . Shortness of Breath  . Abdominal Pain   at the request of Dr Kerby Less  Referring Physician(s): Dr Alfonso Ellis  Supervising Physician: Markus Daft  Patient Status: [facility] - In-pt  History of Present Illness: Latasha [name] is a 65 y.o. female   Dr Joelyn Oms note:  Pt with persistent / worsening renal failure, hyperkalemia, acidosis, and today with ongoing malaise and asterixus.  Rec initiation of HD which she agreed with.  IR consulted for tunneled HD cath, HD#1 thereafter: 2K, thigh heparin, 300/500, 1L UF.    Suspect will be ESRD.    Scheduled now for tunneled HD catheter in IR COVID +  Past Medical History:  Diagnosis Date  . BPPV (benign paroxysmal positional vertigo)   . Chronic congestive heart failure with left ventricular diastolic dysfunction ([place])   . CKD (chronic kidney disease), stage IV ([place]) 05/15/2017  . Diabetes mellitus   . Hyperlipidemia   . Hypertension   . Left atrial enlargement   . Morbid obesity with BMI of 40.0-44.9, adult ([place])   . Palpitations     Past Surgical History:  Procedure Laterality Date  . ABDOMINAL HYSTERECTOMY    . EYE SURGERY     cataract/left eye, right eye had bleeding in back  . KNEE SURGERY    . LEFT HEART CATH AND CORONARY ANGIOGRAPHY N/A 03/09/2018   Procedure: LEFT HEART CATH AND CORONARY ANGIOGRAPHY;  Surgeon: Lorretta Harp, MD;  Location: [facility];  Service: Cardiovascular;  Laterality: N/A;  . TRIGGER FINGER RELEASE      Allergies: Hydrocodone-acetaminophen and Oxycodone hcl  Medications: Prior to Admission medications   Medication Sig Start Date End Date Taking? Authorizing Provider  acetaminophen (TYLENOL) 500 MG tablet Take 500 mg by mouth every 6 (six) hours as needed for mild pain, fever or headache.   Yes [provider]  albuterol  (PROVENTIL HFA;VENTOLIN HFA) 108 (90 Base) MCG/ACT inhaler Inhale 2 puffs into the lungs every 6 (six) hours as needed for wheezing or shortness of breath.   Yes [provider]  amLODipine (NORVASC) 10 MG tablet Take 1 tablet (10 mg total) by mouth daily. 05/18/17  Yes Eugenie Filler, MD  atorvastatin (LIPITOR) 40 MG tablet Take 40 mg by mouth daily.    Yes [provider]  carvedilol (COREG) 25 MG tablet Take 1 tablet (25 mg total) by mouth 2 (two) times daily with a meal. 03/10/18  Yes Florencia Reasons, MD  furosemide (LASIX) 80 MG tablet Take 1 tablet (80 mg total) by mouth 2 (two) times daily. Patient taking differently: Take 80 mg by mouth daily. 03/06/20 07/25/20 Yes Pahwani, Einar Grad, MD  gabapentin (NEURONTIN) 100 MG capsule Take 100 mg by mouth 3 (three) times daily.   Yes [provider]  insulin aspart protamine- aspart (NOVOLOG MIX 70/30) (70-30) 100 UNIT/ML injection Inject 50-60 Units into the skin See admin instructions. Use 60 units every morning and use 50 units every evening   Yes [provider]  lisinopril (ZESTRIL) 5 MG tablet Take 5 mg by mouth daily.   Yes [provider]  nitroGLYCERIN (NITROSTAT) 0.4 MG SL tablet Place 1 tablet (0.4 mg total) under the tongue every 5 (five) minutes as needed for chest pain. 03/10/18  Yes Florencia Reasons, MD  potassium chloride SA (K-DUR,KLOR-CON) 20 MEQ tablet Take  1 tablet (20 mEq total) by mouth daily. 06/11/17  Yes Kerney Elbe, DO     Family History  Problem Relation Age of Onset  . Heart disease Father   . Hypertension Father   . Alzheimer's disease Mother   . Diabetes Mother   . Diabetes Sister   . Dementia Other     Social History   Socioeconomic History  . Marital status: Married    Spouse name: Not on file  . Number of children: Not on file  . Years of education: Not on file  . Highest education level: Not on file  Occupational History  . Occupation: retired  Tobacco Use  . Smoking  status: Never Smoker  . Smokeless tobacco: Never Used  Substance and Sexual Activity  . Alcohol use: Yes    Comment:  rare  . Drug use: No  . Sexual activity: Not on file  Other Topics Concern  . Not on file  Social History Narrative  . Not on file   Social Determinants of Health   Financial Resource Strain: Not on file  Food Insecurity: Not on file  Transportation Needs: Not on file  Physical Activity: Not on file  Stress: Not on file  Social Connections: Not on file    Review of Systems: A 12 point ROS discussed and pertinent positives are indicated in the HPI above.  All other systems are negative.  Review of Systems  Constitutional: Positive for activity change, appetite change and fatigue. Negative for fever.  Respiratory: Positive for cough and shortness of breath.   Cardiovascular: Negative for chest pain.  Gastrointestinal: Positive for diarrhea, nausea and vomiting.  Neurological: Positive for weakness.  Psychiatric/Behavioral: Negative for behavioral problems and confusion.    Vital Signs: BP (!) 119/59 (BP Location: Left Wrist)   Pulse 62   Temp 98.5 F (36.9 C) (Oral)   Resp 18   Wt 283 lb 11.2 oz (128.7 kg)   SpO2 94%   BMI 47.94 kg/m   Physical Exam Vitals reviewed.  Cardiovascular:     Rate and Rhythm: Normal rate and regular rhythm.  Pulmonary:     Effort: Pulmonary effort is normal.     Breath sounds: Wheezing present.  Abdominal:     Palpations: Abdomen is soft.  Musculoskeletal:        General: Normal range of motion.  Skin:    General: Skin is warm.  Neurological:     Mental Status: She is alert and oriented to person, place, and time.  Psychiatric:        Behavior: Behavior normal.     Imaging: DG Chest 2 View  Result Date: 10/09/2020 CLINICAL DATA:  Short of breath since 10/06/2020, wheezing EXAM: CHEST - 2 VIEW COMPARISON:  03/05/2020 FINDINGS: Frontal and lateral views of the chest demonstrates stable enlargement of the cardiac  silhouette. There is stable central vascular prominence without airspace disease, effusion, or pneumothorax. No acute bony abnormalities. IMPRESSION: 1. Chronic central vascular prominence.  No acute airspace disease. Electronically Signed   By: Randa Ngo M.D.   On: 10/09/2020 22:33   US RENAL  Result Date: 10/24/2020 CLINICAL DATA:  Renal failure EXAM: RENAL / URINARY TRACT ULTRASOUND COMPLETE COMPARISON:  Renal stone CT 05/01/2017 Ultrasound kidneys 05/16/2017 FINDINGS: Right Kidney: Renal measurements: 12.2 x 4.8 x 5.2 cm = volume: 159 mL. Echogenicity within normal limits. No mass or hydronephrosis visualized. Left Kidney: Renal measurements: 12.9 x 5.9 x 5.9 cm = volume: 229 mL. Echogenicity  within normal limits. No mass or hydronephrosis visualized. Visualization of the left kidney is limited. Bladder: Not visualized.  Decompressed. Other: None. IMPRESSION: No acute abnormality of the kidneys. Electronically Signed   By: Miachel Roux M.D.   On: 10/24/2020 07:45   DG Chest Port 1 View  Result Date: 10/22/2020 CLINICAL DATA:  Shortness of breath/hypoxia. COVID-19 positive exposure. EXAM: PORTABLE CHEST 1 VIEW COMPARISON:  10/09/2020 FINDINGS: Lungs are adequately inflated with patchy bilateral airspace opacification likely multifocal infection and may be viral in origin. No effusion. Borderline stable cardiomegaly. Remainder of the exam is unchanged. IMPRESSION: Patchy bilateral airspace process likely multifocal pneumonia which may be viral in origin. Electronically Signed   By: Marin Olp M.D.   On: 10/22/2020 09:44    Labs:  CBC: Recent Labs    10/22/20 0945 10/23/20 0941 10/24/20 0312 10/25/20 0551  WBC 6.9 6.6 5.9 7.3  HGB 9.2* 8.5* 8.0* 7.9*  HCT 28.6* 28.3* 26.8* 25.2*  PLT 169 179 182 194    COAGS: Recent Labs    10/23/20 0335  INR 1.2    BMP: Recent Labs    03/05/20 0416 03/05/20 0425 03/06/20 0207 04/20/20 1417 10/22/20 0945 10/24/20 1338 10/24/20 1729  10/25/20 0551 10/25/20 1739  NA 143   < > 142 139   < > 131* 133* 134* 135  K 5.1   < > 4.4 5.2   < > 6.0* 5.8* 5.7* 5.6*  CL 113*   < > 109 103   < > 102 104 105 106  CO2 19*  --  22 21   < > 15* 14* 15* 14*  GLUCOSE 149*   < > 132* 129*   < > 353* 347* 259* 165*  BUN 104*   < > 96* 86*   < > 131* 134* 141* 152*  CALCIUM 8.3*  --  8.6* 8.8   < > 6.4* 6.2* 6.5* 6.4*  CREATININE 3.12*   < > 2.97* 2.95*   < > 7.34* 7.30* 7.72* 7.98*  GFRNONAA 15*  --  16* 16*   < > 6* 6* 5* 5*  GFRAA 18*  --  19* 19*  --   --   --   --   --    < > = values in this interval not displayed.    LIVER FUNCTION TESTS: Recent Labs    03/05/20 0416 10/23/20 0941 10/24/20 1729  BILITOT 0.5 0.5  --   AST 27 46*  --   ALT 34 52*  --   ALKPHOS 81 121  --   PROT 7.4 6.6  --   ALBUMIN 3.7 2.7* 2.3*    TUMOR MARKERS: No results for input(s): AFPTM, CEA, CA199, CHROMGRNA in the last 8760 hours.  Assessment and Plan:  Acute renal failure Worsening renal function Scheduled to initiate dialysis per Nephrology For tunneled HD cath in IR today Risks and benefits discussed with the patient including, but not limited to bleeding, infection, vascular injury, pneumothorax which may require chest tube placement, air embolism or even death  All of the patient's questions were answered, patient is agreeable to proceed. Consent signed and in chart.   Thank you for this interesting consult.  I greatly enjoyed meeting ERYNN [name] and look forward to participating in their care.  A copy of this report was sent to the requesting provider on this date.  Electronically Signed: Lavonia Drafts, PA-C 10/26/2020, 8:04 AM   I spent a total of 20  Minutes    in face to face in clinical consultation, greater than 50% of which was counseling/coordinating care for tunn HD Cath placement

## 2020-10-26 NOTE — Procedures (Signed)
Interventional Radiology Procedure:   Indications: ESRD and starting dialysis  Procedure: Tunneled dialysis catheter  Findings: Right IJ Palindrome, tip in upper right atrium  Complications: None     EBL: less than 10 ml  Plan: Dialysis catheter is ready to use.    Latasha [name] R. Latasha Pancoast, MD  Pager: [phone]

## 2020-10-26 NOTE — Progress Notes (Signed)
Hypoglycemic Event  CBG: 55  Treatment: 57m Dextrose 50  Symptoms: none  Follow-up CBG: Time: 2304 CBG Result: 104  Possible Reasons for Event:patient did not eat dinner  Comments/MD notified:     Latasha [name], ABlondell Reveal

## 2020-10-27 DIAGNOSIS — U071 COVID-19: Secondary | ICD-10-CM | POA: Diagnosis not present

## 2020-10-27 DIAGNOSIS — J1282 Pneumonia due to coronavirus disease 2019: Secondary | ICD-10-CM | POA: Diagnosis not present

## 2020-10-27 LAB — CBC
HCT: 25.7 % — ABNORMAL LOW (ref 36.0–46.0)
Hemoglobin: 8.2 g/dL — ABNORMAL LOW (ref 12.0–15.0)
MCH: 27.9 pg (ref 26.0–34.0)
MCHC: 31.9 g/dL (ref 30.0–36.0)
MCV: 87.4 fL (ref 80.0–100.0)
Platelets: 225 10*3/uL (ref 150–400)
RBC: 2.94 MIL/uL — ABNORMAL LOW (ref 3.87–5.11)
RDW: 13.9 % (ref 11.5–15.5)
WBC: 8.2 10*3/uL (ref 4.0–10.5)
nRBC: 0.6 % — ABNORMAL HIGH (ref 0.0–0.2)

## 2020-10-27 LAB — D-DIMER, QUANTITATIVE: D-Dimer, Quant: 5.59 ug/mL-FEU — ABNORMAL HIGH (ref 0.00–0.50)

## 2020-10-27 LAB — BASIC METABOLIC PANEL
Anion gap: 13 (ref 5–15)
BUN: 99 mg/dL — ABNORMAL HIGH (ref 8–23)
CO2: 20 mmol/L — ABNORMAL LOW (ref 22–32)
Calcium: 6.8 mg/dL — ABNORMAL LOW (ref 8.9–10.3)
Chloride: 107 mmol/L (ref 98–111)
Creatinine, Ser: 5.05 mg/dL — ABNORMAL HIGH (ref 0.44–1.00)
GFR, Estimated: 9 mL/min — ABNORMAL LOW (ref >60)
Glucose, Bld: 55 mg/dL — ABNORMAL LOW (ref 70–99)
Potassium: 3.8 mmol/L (ref 3.5–5.1)
Sodium: 140 mmol/L (ref 135–145)

## 2020-10-27 LAB — GLUCOSE, CAPILLARY
Glucose-Capillary: 91 mg/dL (ref 70–99)
Glucose-Capillary: 124 mg/dL — ABNORMAL HIGH (ref 70–99)
Glucose-Capillary: 16 mg/dL — CL (ref 70–99)
Glucose-Capillary: 85 mg/dL (ref 70–99)
Glucose-Capillary: 37 mg/dL — CL (ref 70–99)
Glucose-Capillary: 46 mg/dL — ABNORMAL LOW (ref 70–99)
Glucose-Capillary: 76 mg/dL (ref 70–99)

## 2020-10-27 LAB — CULTURE, BLOOD (ROUTINE X 2)
Culture: NO GROWTH
Culture: NO GROWTH
Special Requests: ADEQUATE

## 2020-10-27 LAB — C-REACTIVE PROTEIN: CRP: 7.5 mg/dL — ABNORMAL HIGH (ref <1.0)

## 2020-10-27 MED ORDER — CALCIUM ACETATE (PHOS BINDER) 667 MG PO CAPS
1334.0000 mg | ORAL_CAPSULE | Freq: Three times a day (TID) | ORAL | Status: DC
  Administered 2020-10-27 – 2020-11-01 (×15): 1334 mg via ORAL
  Filled 2020-10-27 (×15): qty 2

## 2020-10-27 MED ORDER — INSULIN ASPART 100 UNIT/ML ~~LOC~~ SOLN
0.0000 [IU] | Freq: Three times a day (TID) | SUBCUTANEOUS | Status: DC
  Administered 2020-10-28: 2 [IU] via SUBCUTANEOUS
  Administered 2020-10-28: 1 [IU] via SUBCUTANEOUS
  Administered 2020-10-29 – 2020-11-01 (×10): 2 [IU] via SUBCUTANEOUS

## 2020-10-27 MED ORDER — GLUCOSE 40 % PO GEL
ORAL | Status: AC
  Filled 2020-10-27: qty 1

## 2020-10-27 MED ORDER — DARBEPOETIN ALFA 60 MCG/0.3ML IJ SOSY
60.0000 ug | PREFILLED_SYRINGE | INTRAMUSCULAR | Status: DC
  Filled 2020-10-27: qty 0.3

## 2020-10-27 MED ORDER — DEXTROSE 50 % IV SOLN
INTRAVENOUS | Status: AC
  Administered 2020-10-27: 50 mL
  Filled 2020-10-27: qty 50

## 2020-10-27 MED ORDER — CHLORHEXIDINE GLUCONATE CLOTH 2 % EX PADS
6.0000 | MEDICATED_PAD | Freq: Every day | CUTANEOUS | Status: DC
  Administered 2020-10-28 – 2020-11-01 (×4): 6 via TOPICAL

## 2020-10-27 NOTE — Progress Notes (Signed)
Inpatient Diabetes Program Recommendations  AACE/ADA: New Consensus Statement on Inpatient Glycemic Control (2015)  Target Ranges:  Prepandial:   less than 140 mg/dL      Peak postprandial:   less than 180 mg/dL (1-2 hours)      Critically ill patients:  140 - 180 mg/dL   Lab Results  Component Value Date   GLUCAP 91 10/27/2020   HGBA1C 7.9 (H) 10/23/2020    Review of Glycemic Control Results for Latasha [name], Latasha [name] (MRN [id number]) as of 10/27/2020 09:01  Ref. Range 10/26/2020 21:21 10/26/2020 22:25 10/26/2020 23:04 10/27/2020 07:25  Glucose-Capillary Latest Ref Range: 70 - 99 mg/dL 51 (L) 55 (L) 104 (H) 91   Diabetes history: Type 2 DM Outpatient Diabetes medications: Novolog 70/30 60 units QAM, 50 units QPM Current orders for Inpatient glycemic control: Novolog 0-20 units TID, Novolog 0-5 units QHS,  Novolog 70/30 60 units QAM, 50 units QPM   Inpatient Diabetes Program Recommendations:    Noted hypoglycemia following 70/30. With current renal status, consider reducing Novolog 70/30 doses to 15 units BID and reducing correction to Novolog 0-6 units TID. Secure chat sent to MD.  Thanks, Bronson Curb, MSN, RNC-OB Diabetes Coordinator [phone] (8a-5p)

## 2020-10-27 NOTE — Plan of Care (Signed)

## 2020-10-27 NOTE — Progress Notes (Signed)
[facility] Progress Note   Assessment/ Plan:   1. ESRD     NAGMA     - Followed by Dr.Kruska. Last office visit (12/3), Cr 2.78 and BUN of 101     - CKD has been attributed to DM2 and HTN      - CKD 4 has progressed in setting of n/v/d with Covid-19 infection to ESRD on admission.       - Patient had tunneled cath placed 1/20 and underwent HD#1       - Patient has pain at tunneled cath, but no sign of infection. No report of problems with HD.        - No HD today, likely HD#2 tomorrow    2. Acute hypoxic respiratory failure in setting of Covid-19 Pneumonia      - Stable , Sating well on room air during exam.      - Completed Remdesivir and Barcitinib, steroids discontinued after 3 days for hyperglycemia  3. HFpEF     - Coreg      - holding lasix  4.  Anemia of CKD       - Stable , Hgb 8.2  5. . Hypertension:        -  on amlodipine 10 mg and Coreg as above         -  At hospital goal  6.. Nutrition: Renal/Carb Modified     See attending addendum for final recommendations.  Subjective:   Latasha [name] is a 65 y.o. with PMH of HTN,HLD,DM2,HFpEF, and CKD4 admitted for acute hypoxic respiratory failure 2/2 Covid-19 pneumonia on hospital day 5. Seeing in consult for AKI on CKD 4.   Patient currently in HD. Had increase in edema overnight, but no other acute concerns.     Objective:   BP (!) 159/68 (BP Location: Left Arm)   Pulse 66   Temp 98 F (36.7 C) (Oral)   Resp 19   Wt (!) 138.9 kg   SpO2 91%   BMI 51.75 kg/m   Physical Exam: Gen: chronically ill appearing , obese CVS:RRR, no murmurs, rubs, or gallops.  Resp: CTA anteriorly, decreased bilateral bases, no wheezing  Abd:Bowel sounds present, soft [id number] and well perfused   Labs: BMET Recent Labs  Lab 10/24/20 0842 10/24/20 1338 10/24/20 1729 10/25/20 0551 10/25/20 1739 10/26/20 0805 10/27/20 0328  NA 133* 131* 133* 134* 135 138 140  K 6.3* 6.0* 5.8* 5.7* 5.6* 5.2* 3.8  CL 102 102  104 105 106 106 107  CO2 16* 15* 14* 15* 14* 14* 20*  GLUCOSE 328* 353* 347* 259* 165* 126* 55*  BUN 127* 131* 134* 141* 152* 161* 99*  CREATININE 7.09* 7.34* 7.30* 7.72* 7.98* 8.22* 5.05*  CALCIUM 6.7* 6.4* 6.2* 6.5* 6.4* 6.6* 6.8*  PHOS  --   --  8.8*  --   --  10.4*  --    CBC Recent Labs  Lab 10/24/20 0312 10/25/20 0551 10/26/20 0718 10/27/20 0328  WBC 5.9 7.3 8.6 8.2  HGB 8.0* 7.9* 8.0* 8.2*  HCT 26.8* 25.2* 26.2* 25.7*  MCV 94.0 91.6 91.9 87.4  PLT 182 194 216 225      Medications:    . amLODipine  10 mg Oral Daily  . aspirin EC  81 mg Oral Daily  . carvedilol  25 mg Oral BID WC  . Chlorhexidine Gluconate Cloth  6 each Topical Daily  . cycloSPORINE  1 drop Both Eyes BID  . heparin  injection (subcutaneous)  7,500 Units Subcutaneous Q8H  . insulin aspart  0-20 Units Subcutaneous TID WC  . insulin aspart  0-5 Units Subcutaneous QHS  . insulin aspart protamine- aspart  50 Units Subcutaneous Q supper  . insulin aspart protamine- aspart  60 Units Subcutaneous Q breakfast     Tamsen Snider, MD PGY2 IM 10/27/2020, 9:59 AM

## 2020-10-27 NOTE — Progress Notes (Signed)
PROGRESS NOTE    Latasha [name]  [id number] DOB: 29-May-1956 DOA: 10/22/2020 PCP: Shirline Frees, MD   Brief Narrative:  Latasha [name] is a 65 y.o. female with medical history significant for chronic congestive heart failure with diastolic dysfunction, chronic kidney disease stage IV, diabetes mellitus hypertension morbid obesity who presented to the emergency department this morning with 8 to 9 days of cough fever malaise generalized weakness intermittent vomiting diarrhea and shortness of breath.  She had had an appointment with her primary care doctor via telehealth who prescribed antibiotics which she completed several days ago initially she felt that the antibiotics was helping but the last few days she just started feeling worse with coughing not eating well fatigued and worsening shortness of breath.  Also worsening swelling of her legs and feet.  Stated she takes her water pill regularly.  She received 2 doses of Pfizer vaccine last one was April 2021 but has not received her booster dose.  Patient stated that her son tested positive for COVID and she has been around him.  She got worse today and decided to call EMS on arrival of the EMS they found patient with a saturation of 76% on room air and initially placed on 6 L which increased oxygenation to 87 and then she had to have a nonrebreather oxygen which increased her oxygen to 96% currently she is on room air at 5 L/min in the emergency department. In the emergency room was placed on 5 L/min with saturation of 91 to 95%.  She was found to have acute on chronic kidney disease her baseline creatinine is around 2.8 today is 3.9.  Her blood sugar was 100 she was started on remdesivir and Decadron IV in the emergency department.  Seen her in the emergency department she says she is feeling much better than when she came in.  She had a low-grade temperature of 99.8 patient was started on IV fluids in the ED as well as received 1 dose of Zofran for  nausea.  Assessment & Plan:   Principal Problem:   Pneumonia due to COVID-19 virus Active Problems:   Insulin-requiring or dependent type II diabetes mellitus ([place])   Morbid obesity ([place])   Hyperlipemia   Bilateral leg edema   CKD (chronic kidney disease) stage 4, GFR 15-29 ml/min (HCC)   Acute on chronic respiratory failure with hypoxia (HCC)   Acute metabolic encephalopathy, multifactorial, resolving - Likely in the setting of worsening kidney function and elevated uremia  - Concurrently acute hypoxic respiratory failure and COVID-19 pneumonia likely playing a small role as well  Acute hypoxic respiratory failure secondary to covid 19 pneumonia, POA SpO2: 91 % O2 Flow Rate (L/min): 2 L/min - Continue remdesivir, barcitinib, per protocol - Steroids discontinued early given hyperglycemia, mental status changes and rapidly improving COVID pneumonia and hypoxia - Early ambulation/prone/incentive spriometry/flutter as tolerated - Patient has 2 vaccines but no booster - more than 6 months since second vaccine. Recent Labs    10/25/20 0551 10/26/20 0718 10/27/20 0328  DDIMER 1.10* 2.05* 5.59*  CRP 12.8* 9.4* 7.5*   AKI on CKD4, likely ESRD now - on iHD - Likely secondary to poor PO intake due to covid infection on advanced chronic disease - Increase PO intake as tolerated/IVF per nephrology - Nephrology following appreciate insight/recommendations - Renal ultrasound unremarkable - UOP appears to be minimally improving over the past 24h  Hyperkalemia, resolved - Repeat potassium downtrending within normal limits - Nephrology managing with dialysis  IDDM2 - uncontrolled with hyperglycemia - Steroids discontinued as above given mental status changes - Sliding scale insulin/hypoglycemic protocol - Decreasing glucose levels now that steroids are off - titrate insulin accordingly  Chronic anemia of CKD, stable -Continue to follow morning labs  DVT prophylaxis: Lovenox Code  Status: Full Family Communication: Husband and son updated over the phone  Status is: INPT  Dispo: The patient is from: Home              Anticipated d/c is to: TBD - likely home pending clinical course              Anticipated d/c date is: 72+ hours              Patient currently NOT medically stable for discharge  Consultants:   Nephrology  Procedures:   None  Antimicrobials:  Remdesivir   Subjective: No acute issues or events overnight, patient's confusion improving drastically, only complaint this morning was back pain, requesting to get out of bed to chair and ambulate which is certainly reasonable. She otherwise denies fevers, chills, chest pain, shortness of breath, nausea, vomiting, diarrhea, constipation.  Objective: Vitals:   10/26/20 1625 10/26/20 2119 10/27/20 0500 10/27/20 0930  BP: 140/66 (!) 133/57  (!) 159/68  Pulse:  65  66  Resp:  20  19  Temp: 97.6 F (36.4 C) 98.1 F (36.7 C)  98 F (36.7 C)  TempSrc: Axillary Oral  Oral  SpO2:  97%  91%  Weight:   (!) 138.9 kg     Intake/Output Summary (Last 24 hours) at 10/27/2020 1210 Last data filed at 10/26/2020 2250 Gross per 24 hour  Intake 340 ml  Output 1850 ml  Net -1510 ml   Filed Weights   10/26/20 1345 10/26/20 1620 10/27/20 0500  Weight: 133.3 kg 132.3 kg (!) 138.9 kg    Examination:  General:  Pleasantly resting in bed, No acute distress. HEENT:  Normocephalic atraumatic.  Sclerae nonicteric, noninjected.  Extraocular movements intact bilaterally. Neck:  Without mass or deformity.  Trachea is midline. Lungs: Diminished bilaterally without rhonchi, wheeze, or rales. Heart:  Regular rate and rhythm.  Without murmurs, rubs, or gallops. Abdomen:  Soft, obese, nontender, nondistended.  Without guarding or rebound. Extremities: Without cyanosis, clubbing, edema, or obvious deformity. Vascular:  Dorsalis pedis and posterior tibial pulses palpable bilaterally. Skin:  Warm and dry, no erythema, no  ulcerations.  Data Reviewed: I have personally reviewed following labs and imaging studies  CBC: Recent Labs  Lab 10/23/20 0941 10/24/20 0312 10/25/20 0551 10/26/20 0718 10/27/20 0328  WBC 6.6 5.9 7.3 8.6 8.2  HGB 8.5* 8.0* 7.9* 8.0* 8.2*  HCT 28.3* 26.8* 25.2* 26.2* 25.7*  MCV 95.0 94.0 91.6 91.9 87.4  PLT 179 182 194 [id number]   Basic Metabolic Panel: Recent Labs  Lab 10/24/20 1729 10/25/20 0551 10/25/20 1739 10/26/20 0805 10/27/20 0328  NA 133* 134* 135 138 140  K 5.8* 5.7* 5.6* 5.2* 3.8  CL 104 105 106 106 107  CO2 14* 15* 14* 14* 20*  GLUCOSE 347* 259* 165* 126* 55*  BUN 134* 141* 152* 161* 99*  CREATININE 7.30* 7.72* 7.98* 8.22* 5.05*  CALCIUM 6.2* 6.5* 6.4* 6.6* 6.8*  PHOS 8.8*  --   --  10.4*  --    GFR: CrCl cannot be calculated (Unknown ideal weight.). Liver Function Tests: Recent Labs  Lab 10/23/20 0941 10/24/20 1729 10/26/20 0805  AST 46*  --   --   ALT 52*  --   --  ALKPHOS 121  --   --   BILITOT 0.5  --   --   PROT 6.6  --   --   ALBUMIN 2.7* 2.3* 2.6*   No results for input(s): LIPASE, AMYLASE in the last 168 hours. No results for input(s): AMMONIA in the last 168 hours. Coagulation Profile: Recent Labs  Lab 10/23/20 0335  INR 1.2   Cardiac Enzymes: Recent Labs  Lab 10/24/20 1338  CKTOTAL 1,198*   BNP (last 3 results) No results for input(s): PROBNP in the last 8760 hours. HbA1C: No results for input(s): HGBA1C in the last 72 hours. CBG: Recent Labs  Lab 10/26/20 2121 10/26/20 2225 10/26/20 2304 10/27/20 0725 10/27/20 1143  GLUCAP 51* 55* 104* 91 124*   Lipid Profile: No results for input(s): CHOL, HDL, LDLCALC, TRIG, CHOLHDL, LDLDIRECT in the last 72 hours. Thyroid Function Tests: No results for input(s): TSH, T4TOTAL, FREET4, T3FREE, THYROIDAB in the last 72 hours. Anemia Panel: No results for input(s): VITAMINB12, FOLATE, FERRITIN, TIBC, IRON, RETICCTPCT in the last 72 hours. Sepsis Labs: Recent Labs  Lab  10/22/20 0945 10/22/20 0953 10/23/20 0335  PROCALCITON 0.81  --   --   LATICACIDVEN  --  1.0 0.6    Recent Results (from the past 240 hour(s))  Blood Culture (routine x 2)     Status: None   Collection Time: 10/22/20  9:30 AM   Specimen: BLOOD  Result Value Ref Range Status   Specimen Description BLOOD SITE NOT SPECIFIED  Final   Special Requests   Final    BOTTLES DRAWN AEROBIC AND ANAEROBIC Blood Culture adequate volume   Culture   Final    NO GROWTH 5 DAYS Performed at [facility], [address], [place], [place] [address]    Report Status 10/27/2020 FINAL  Final  Blood Culture (routine x 2)     Status: None   Collection Time: 10/22/20 10:01 AM   Specimen: BLOOD RIGHT HAND  Result Value Ref Range Status   Specimen Description BLOOD RIGHT HAND  Final   Special Requests   Final    BOTTLES DRAWN AEROBIC AND ANAEROBIC Blood Culture results may not be optimal due to an inadequate volume of blood received in culture bottles   Culture   Final    NO GROWTH 5 DAYS Performed at [facility], [place] [address] [place], [place] [address]    Report Status 10/27/2020 FINAL  Final     Radiology Studies: IR Fluoro Guide CV Line Right  Result Date: 10/26/2020 INDICATION: 65 year old with end-stage renal disease and COVID-19. Patient needs access for hemodialysis. EXAM: FLUOROSCOPIC AND ULTRASOUND GUIDED PLACEMENT OF A TUNNELED DIALYSIS CATHETER Physician: Stephan Minister. Anselm Pancoast, MD MEDICATIONS: Ancef 2 g; The antibiotic was administered within an appropriate time interval prior to skin puncture. ANESTHESIA/SEDATION: Versed 1.0 mg IV; Fentanyl 50 mcg IV; Moderate Sedation Time:  32 minutes The patient was continuously monitored during the procedure by the interventional radiology nurse under my direct supervision. FLUOROSCOPY TIME:  Fluoroscopy Time: 24 seconds, 14 mGy COMPLICATIONS: None immediate. PROCEDURE: The procedure was explained to the patient. The risks and benefits of the  procedure were discussed and the patient's questions were addressed. Informed consent was obtained from the patient. The patient was placed supine on the interventional table. Ultrasound confirmed a patent right internal jugular vein. Ultrasound images were obtained for documentation. The right neck and chest was prepped and draped in a sterile fashion. The right neck was anesthetized with  1% lidocaine. Maximal barrier sterile technique was utilized including caps, mask, sterile gowns, sterile gloves, sterile drape, hand hygiene and skin antiseptic. A small incision was made with #11 blade scalpel. A 21 gauge needle directed into the right internal jugular vein with ultrasound guidance. A micropuncture dilator set was placed. A 23 cm tip to cuff Palindrome catheter was selected. The skin below the right clavicle was anesthetized and a small incision was made with an #11 blade scalpel. A subcutaneous tunnel was formed to the vein dermatotomy site. The catheter was brought through the tunnel. The vein dermatotomy site was dilated to accommodate a peel-away sheath. The catheter was placed through the peel-away sheath and directed into the central venous structures. The tip of the catheter was placed in the upper right atrium with fluoroscopy. Fluoroscopic images were obtained for documentation. Both lumens were found to aspirate and flush well. The proper amount of heparin was flushed in both lumens. The vein dermatotomy site was closed using a single layer of absorbable suture and Dermabond. Gel-Foam was placed in subcutaneous tract. The catheter was secured to the skin using Prolene suture. IMPRESSION: Successful placement of a right jugular tunneled dialysis catheter using ultrasound and fluoroscopic guidance. Electronically Signed   By: Markus Daft M.D.   On: 10/26/2020 10:41   IR US Guide Vasc Access Right  Result Date: 10/26/2020 INDICATION: 65 year old with end-stage renal disease and COVID-19. Patient needs  access for hemodialysis. EXAM: FLUOROSCOPIC AND ULTRASOUND GUIDED PLACEMENT OF A TUNNELED DIALYSIS CATHETER Physician: Stephan Minister. Anselm Pancoast, MD MEDICATIONS: Ancef 2 g; The antibiotic was administered within an appropriate time interval prior to skin puncture. ANESTHESIA/SEDATION: Versed 1.0 mg IV; Fentanyl 50 mcg IV; Moderate Sedation Time:  32 minutes The patient was continuously monitored during the procedure by the interventional radiology nurse under my direct supervision. FLUOROSCOPY TIME:  Fluoroscopy Time: 24 seconds, 14 mGy COMPLICATIONS: None immediate. PROCEDURE: The procedure was explained to the patient. The risks and benefits of the procedure were discussed and the patient's questions were addressed. Informed consent was obtained from the patient. The patient was placed supine on the interventional table. Ultrasound confirmed a patent right internal jugular vein. Ultrasound images were obtained for documentation. The right neck and chest was prepped and draped in a sterile fashion. The right neck was anesthetized with 1% lidocaine. Maximal barrier sterile technique was utilized including caps, mask, sterile gowns, sterile gloves, sterile drape, hand hygiene and skin antiseptic. A small incision was made with #11 blade scalpel. A 21 gauge needle directed into the right internal jugular vein with ultrasound guidance. A micropuncture dilator set was placed. A 23 cm tip to cuff Palindrome catheter was selected. The skin below the right clavicle was anesthetized and a small incision was made with an #11 blade scalpel. A subcutaneous tunnel was formed to the vein dermatotomy site. The catheter was brought through the tunnel. The vein dermatotomy site was dilated to accommodate a peel-away sheath. The catheter was placed through the peel-away sheath and directed into the central venous structures. The tip of the catheter was placed in the upper right atrium with fluoroscopy. Fluoroscopic images were obtained for  documentation. Both lumens were found to aspirate and flush well. The proper amount of heparin was flushed in both lumens. The vein dermatotomy site was closed using a single layer of absorbable suture and Dermabond. Gel-Foam was placed in subcutaneous tract. The catheter was secured to the skin using Prolene suture. IMPRESSION: Successful placement of a right jugular tunneled dialysis  catheter using ultrasound and fluoroscopic guidance. Electronically Signed   By: Markus Daft M.D.   On: 10/26/2020 10:41   Scheduled Meds: . amLODipine  10 mg Oral Daily  . aspirin EC  81 mg Oral Daily  . calcium acetate  1,334 mg Oral TID WC  . carvedilol  25 mg Oral BID WC  . Chlorhexidine Gluconate Cloth  6 each Topical Daily  . Chlorhexidine Gluconate Cloth  6 each Topical Q0600  . cycloSPORINE  1 drop Both Eyes BID  . [START ON 10/28/2020] darbepoetin (ARANESP) injection - DIALYSIS  60 mcg Intravenous Q Sat-HD  . heparin injection (subcutaneous)  7,500 Units Subcutaneous Q8H  . insulin aspart  0-20 Units Subcutaneous TID WC  . insulin aspart  0-5 Units Subcutaneous QHS  . insulin aspart protamine- aspart  50 Units Subcutaneous Q supper  . insulin aspart protamine- aspart  60 Units Subcutaneous Q breakfast   Continuous Infusions: . sodium chloride 1,000 mL (10/24/20 1648)     LOS: 5 days    Time spent: 14mn  Ka Bench C Janazia Schreier, DO [facility]  If 7PM-7AM, please contact night-coverage [email or web address]  10/27/2020, 12:10 PM

## 2020-10-27 NOTE — Progress Notes (Signed)
Hypoglycemic Event  CBG: 37  Treatment: 8oz  Symptoms: cold, shaky  Follow-up CBG: Time: 10:15 PM  CBG Result: 76  Possible Reasons for Event: Dinner tray untouched  Comments/MD notified: intially climbed to 46, patient only drank 4oz    Latasha [name], Blondell Reveal

## 2020-10-27 NOTE — Progress Notes (Signed)
Patient has been saying she feels a foreign object in her eye.  No foreign object on assessment. The on call provider was made aware and orders were made.

## 2020-10-27 NOTE — Progress Notes (Signed)
All documents have been faxed for complete outpatient HD referral. Navigator continuing to follow.  Alphonzo Cruise, [place] [facility] Navigator [phone]

## 2020-10-28 DIAGNOSIS — U071 COVID-19: Secondary | ICD-10-CM | POA: Diagnosis not present

## 2020-10-28 DIAGNOSIS — J1282 Pneumonia due to coronavirus disease 2019: Secondary | ICD-10-CM | POA: Diagnosis not present

## 2020-10-28 LAB — GLUCOSE, CAPILLARY
Glucose-Capillary: 74 mg/dL (ref 70–99)
Glucose-Capillary: 126 mg/dL — ABNORMAL HIGH (ref 70–99)
Glucose-Capillary: 195 mg/dL — ABNORMAL HIGH (ref 70–99)
Glucose-Capillary: 165 mg/dL — ABNORMAL HIGH (ref 70–99)

## 2020-10-28 LAB — CBC
HCT: 27.7 % — ABNORMAL LOW (ref 36.0–46.0)
Hemoglobin: 8.9 g/dL — ABNORMAL LOW (ref 12.0–15.0)
MCH: 28.3 pg (ref 26.0–34.0)
MCHC: 32.1 g/dL (ref 30.0–36.0)
MCV: 87.9 fL (ref 80.0–100.0)
Platelets: 274 10*3/uL (ref 150–400)
RBC: 3.15 MIL/uL — ABNORMAL LOW (ref 3.87–5.11)
RDW: 13.9 % (ref 11.5–15.5)
WBC: 9.8 10*3/uL (ref 4.0–10.5)
nRBC: 0.3 % — ABNORMAL HIGH (ref 0.0–0.2)

## 2020-10-28 LAB — BASIC METABOLIC PANEL
Anion gap: 15 (ref 5–15)
BUN: 105 mg/dL — ABNORMAL HIGH (ref 8–23)
CO2: 20 mmol/L — ABNORMAL LOW (ref 22–32)
Calcium: 7 mg/dL — ABNORMAL LOW (ref 8.9–10.3)
Chloride: 106 mmol/L (ref 98–111)
Creatinine, Ser: 4.72 mg/dL — ABNORMAL HIGH (ref 0.44–1.00)
GFR, Estimated: 10 mL/min — ABNORMAL LOW (ref >60)
Glucose, Bld: 99 mg/dL (ref 70–99)
Potassium: 4.3 mmol/L (ref 3.5–5.1)
Sodium: 141 mmol/L (ref 135–145)

## 2020-10-28 LAB — FERRITIN: Ferritin: 604 ng/mL — ABNORMAL HIGH (ref 11–307)

## 2020-10-28 LAB — D-DIMER, QUANTITATIVE: D-Dimer, Quant: 3.99 ug/mL-FEU — ABNORMAL HIGH (ref 0.00–0.50)

## 2020-10-28 LAB — IRON AND TIBC
Iron: 25 ug/dL — ABNORMAL LOW (ref 28–170)
Saturation Ratios: 13 % (ref 10.4–31.8)
TIBC: 186 ug/dL — ABNORMAL LOW (ref 250–450)
UIBC: 161 ug/dL

## 2020-10-28 LAB — C-REACTIVE PROTEIN: CRP: 12 mg/dL — ABNORMAL HIGH (ref <1.0)

## 2020-10-28 MED ORDER — ONDANSETRON HCL 4 MG/2ML IJ SOLN: 4.0000 mg | Freq: Four times a day (QID) | INTRAMUSCULAR | Status: DC

## 2020-10-28 MED ORDER — HYDROXYZINE HCL 10 MG PO TABS
10.0000 mg | ORAL_TABLET | Freq: Once | ORAL | Status: AC
  Administered 2020-10-28: 10 mg via ORAL
  Filled 2020-10-28: qty 1

## 2020-10-28 MED ORDER — SODIUM CHLORIDE 0.9 % IV SOLN
250.0000 mg | Freq: Every day | INTRAVENOUS | Status: AC
  Administered 2020-10-28 – 2020-10-29 (×2): 250 mg via INTRAVENOUS
  Filled 2020-10-28 (×3): qty 20

## 2020-10-28 MED ORDER — ONDANSETRON HCL 4 MG/2ML IJ SOLN
4.0000 mg | Freq: Four times a day (QID) | INTRAMUSCULAR | Status: DC | PRN
  Administered 2020-10-28 – 2020-10-31 (×4): 4 mg via INTRAVENOUS
  Filled 2020-10-28 (×4): qty 2

## 2020-10-28 NOTE — Progress Notes (Addendum)
PROGRESS NOTE    ROSYLN RAPOZA  [id number] DOB: 11-02-1955 DOA: 10/22/2020 PCP: Shirline Frees, MD   Brief Narrative:  Latasha [name] is a 65 y.o. female with medical history significant for chronic congestive heart failure with diastolic dysfunction, chronic kidney disease stage IV, diabetes mellitus hypertension morbid obesity who presented to the emergency department this morning with 8 to 9 days of cough fever malaise generalized weakness intermittent vomiting diarrhea and shortness of breath - started feeling worse with coughing not eating well fatigued and worsening shortness of breath, worsening swelling of her legs and feet. Has received 2 doses of Pfizer vaccine last vaccine April 2021 but has not received her booster dose. Patient stated that her son tested positive for COVID and she has been around him. EMS they found patient with a saturation of 76% on room air and initially placed on 6 L which increased oxygenation to 87 and then she had to have a nonrebreather oxygen which increased her oxygen to 96% currently she is on room air at 5 L/min in the emergency department.  Assessment & Plan:   Principal Problem:   Pneumonia due to COVID-19 virus Active Problems:   Insulin-requiring or dependent type II diabetes mellitus ([place])   Morbid obesity ([place])   Hyperlipemia   Bilateral leg edema   CKD (chronic kidney disease) stage 4, GFR 15-29 ml/min (HCC)   Acute on chronic respiratory failure with hypoxia (HCC)   Acute metabolic encephalopathy, multifactorial, resolving - Likely in the setting of worsening kidney function and elevated uremia -improving with dialysis - Concurrently acute hypoxic respiratory failure and COVID-19 pneumonia likely playing a small role as well  Acute hypoxic respiratory failure secondary to covid 19 pneumonia/volume overload, POA SpO2: 93 % O2 Flow Rate (L/min): 2 L/min - Continue remdesivir, barcitinib, per protocol - Steroids discontinued early  given hyperglycemia and mental status changes and rapidly improving COVID pneumonia and hypoxia - Early ambulation/prone/incentive spriometry/flutter as tolerated - Patient has 2 vaccines but no booster - more than 6 months since second vaccine. - Inflammatory markers somewhat labile in the setting of steroid discontinuation Recent Labs    10/26/20 0718 10/27/20 0328 10/28/20 0205  DDIMER 2.05* 5.59* 3.99*  FERRITIN  --   --  604*  CRP 9.4* 7.5* 12.0*   AKI on CKD4, likely ESRD now - on iHD - Likely secondary to poor PO intake due to covid infection on advanced chronic disease - Increase PO intake as tolerated/IVF per nephrology - Nephrology following appreciate insight/recommendations - Dialysis planned later this morning - Renal ultrasound unremarkable - UOP appears to be minimally improving over the past 24h  IDDM2 - uncontrolled with hyperglycemia - Steroids discontinued as above given mental status changes - Sliding scale insulin/hypoglycemic protocol - Stopped 70/30 insulin 1/22 given recurrent hypoglycemia despite decreasing sliding scale - due to very poor PO intake - Will likely need to transition back up on insulin over the next 48h as patient's appetite improves  Chronic anemia of CKD, stable Concurrent iron deficiency anemia - Continue to follow morning labs -Nephrology to initiate IV iron  DVT prophylaxis: Lovenox Code Status: Full Family Communication: Husband and son updated over the phone  Status is: INPT  Dispo: The patient is from: Home              Anticipated d/c is to: TBD - likely home pending clinical course              Anticipated d/c date is: 72+  hours              Patient currently NOT medically stable for discharge  Consultants:   Nephrology  Procedures:   None  Antimicrobials:  Remdesivir   Subjective: No acute issues or events overnight, patient's confusion improving drastically, only complaint this morning was back pain,  requesting to get out of bed to chair and ambulate which is certainly reasonable. She otherwise denies fevers, chills, chest pain, shortness of breath, nausea, vomiting, diarrhea, constipation.  Objective: Vitals:   10/27/20 1539 10/27/20 2204 10/28/20 0508 10/28/20 0836  BP: 133/60 (!) 125/59 (!) 144/61 (!) 152/71  Pulse: (!) 56 (!) 54 (!) 58 67  Resp: '19 18 20 19  '$ Temp: 98 F (36.7 C) 98 F (36.7 C) 98.5 F (36.9 C) 98 F (36.7 C)  TempSrc: Oral Oral Oral Oral  SpO2: 90% 94% 98% 93%  Weight:        Intake/Output Summary (Last 24 hours) at 10/28/2020 1421 Last data filed at 10/28/2020 0400 Gross per 24 hour  Intake 714 ml  Output 1475 ml  Net -761 ml   Filed Weights   10/26/20 1345 10/26/20 1620 10/27/20 0500  Weight: 133.3 kg 132.3 kg (!) 138.9 kg    Examination:  General:  Pleasantly resting in bed, No acute distress.  Awake, alert to person place and situation. HEENT:  Normocephalic atraumatic.  Sclerae nonicteric, noninjected.  Extraocular movements intact bilaterally. Neck:  Without mass or deformity.  Trachea is midline. Lungs: Diminished bilaterally without rhonchi, wheeze, or rales. Heart:  Regular rate and rhythm.  Without murmurs, rubs, or gallops. Abdomen:  Soft, obese, nontender, nondistended.  Without guarding or rebound. Extremities: Without cyanosis, clubbing, edema, or obvious deformity. Vascular:  Dorsalis pedis and posterior tibial pulses palpable bilaterally. Skin:  Warm and dry, no erythema, no ulcerations.  Data Reviewed: I have personally reviewed following labs and imaging studies  CBC: Recent Labs  Lab 10/24/20 0312 10/25/20 0551 10/26/20 0718 10/27/20 0328 10/28/20 0205  WBC 5.9 7.3 8.6 8.2 9.8  HGB 8.0* 7.9* 8.0* 8.2* 8.9*  HCT 26.8* 25.2* 26.2* 25.7* 27.7*  MCV 94.0 91.6 91.9 87.4 87.9  PLT 182 194 216 225 [id number]   Basic Metabolic Panel: Recent Labs  Lab 10/24/20 1729 10/25/20 0551 10/25/20 1739 10/26/20 0805 10/27/20 0328  10/28/20 0205  NA 133* 134* 135 138 140 141  K 5.8* 5.7* 5.6* 5.2* 3.8 4.3  CL 104 105 106 106 107 106  CO2 14* 15* 14* 14* 20* 20*  GLUCOSE 347* 259* 165* 126* 55* 99  BUN 134* 141* 152* 161* 99* 105*  CREATININE 7.30* 7.72* 7.98* 8.22* 5.05* 4.72*  CALCIUM 6.2* 6.5* 6.4* 6.6* 6.8* 7.0*  PHOS 8.8*  --   --  10.4*  --   --    GFR: CrCl cannot be calculated (Unknown ideal weight.). Liver Function Tests: Recent Labs  Lab 10/23/20 0941 10/24/20 1729 10/26/20 0805  AST 46*  --   --   ALT 52*  --   --   ALKPHOS 121  --   --   BILITOT 0.5  --   --   PROT 6.6  --   --   ALBUMIN 2.7* 2.3* 2.6*   No results for input(s): LIPASE, AMYLASE in the last 168 hours. No results for input(s): AMMONIA in the last 168 hours. Coagulation Profile: Recent Labs  Lab 10/23/20 0335  INR 1.2   Cardiac Enzymes: Recent Labs  Lab 10/24/20 1338  CKTOTAL 1,198*  BNP (last 3 results) No results for input(s): PROBNP in the last 8760 hours. HbA1C: No results for input(s): HGBA1C in the last 72 hours. CBG: Recent Labs  Lab 10/27/20 2111 10/27/20 2139 10/27/20 2214 10/28/20 0832 10/28/20 1137  GLUCAP 37* 46* 76 74 126*   Lipid Profile: No results for input(s): CHOL, HDL, LDLCALC, TRIG, CHOLHDL, LDLDIRECT in the last 72 hours. Thyroid Function Tests: No results for input(s): TSH, T4TOTAL, FREET4, T3FREE, THYROIDAB in the last 72 hours. Anemia Panel: Recent Labs    10/28/20 0205  FERRITIN 604*  TIBC 186*  IRON 25*   Sepsis Labs: Recent Labs  Lab 10/22/20 0945 10/22/20 0953 10/23/20 0335  PROCALCITON 0.81  --   --   LATICACIDVEN  --  1.0 0.6    Recent Results (from the past 240 hour(s))  Blood Culture (routine x 2)     Status: None   Collection Time: 10/22/20  9:30 AM   Specimen: BLOOD  Result Value Ref Range Status   Specimen Description BLOOD SITE NOT SPECIFIED  Final   Special Requests   Final    BOTTLES DRAWN AEROBIC AND ANAEROBIC Blood Culture adequate volume    Culture   Final    NO GROWTH 5 DAYS Performed at [facility], [place] [address] [place], [place] [address]    Report Status 10/27/2020 FINAL  Final  Blood Culture (routine x 2)     Status: None   Collection Time: 10/22/20 10:01 AM   Specimen: BLOOD RIGHT HAND  Result Value Ref Range Status   Specimen Description BLOOD RIGHT HAND  Final   Special Requests   Final    BOTTLES DRAWN AEROBIC AND ANAEROBIC Blood Culture results may not be optimal due to an inadequate volume of blood received in culture bottles   Culture   Final    NO GROWTH 5 DAYS Performed at [facility], [place] [address], [place], [place] [address]    Report Status 10/27/2020 FINAL  Final     Radiology Studies: No results found. Scheduled Meds: . amLODipine  10 mg Oral Daily  . aspirin EC  81 mg Oral Daily  . calcium acetate  1,334 mg Oral TID WC  . carvedilol  25 mg Oral BID WC  . Chlorhexidine Gluconate Cloth  6 each Topical Daily  . Chlorhexidine Gluconate Cloth  6 each Topical Q0600  . cycloSPORINE  1 drop Both Eyes BID  . darbepoetin (ARANESP) injection - DIALYSIS  60 mcg Intravenous Q Sat-HD  . heparin injection (subcutaneous)  7,500 Units Subcutaneous Q8H  . insulin aspart  0-5 Units Subcutaneous QHS  . insulin aspart  0-9 Units Subcutaneous TID WC   Continuous Infusions: . sodium chloride 1,000 mL (10/24/20 1648)  . ferric gluconate (FERRLECIT/NULECIT) IV       LOS: 6 days    Time spent: 58mn  Jordy Verba C Hennesy Sobalvarro, DO [facility]  If 7PM-7AM, please contact night-coverage [email or web address]  10/28/2020, 2:21 PM

## 2020-10-28 NOTE — Evaluation (Signed)
Physical Therapy Evaluation Patient Details Name: Latasha [name] MRN: [id number] DOB: 30-Oct-1955 Today's Date: 10/28/2020   History of Present Illness  65 y.o. female admitted on 10/22/20 forSOB, fatigue/weakness, increase swelling in bil LEs.  Pt dx with acute hypoxic respiratory failure due to COVID 19 PNA.  AKI on CKD stage IV now on iHD s/p vascular access R chest on 10/26/20, hyperkalemia.  Pt with significant PMH of morbid obesity, HTN, DM, CHF, BPPV, knee surgery.  Clinical Impression  Pt sat EOB for >20 mins wanting to sit up due to low back pain.  EOB pt is tremorous (mostly in her L UE which she states is new), vomited x 2 and did not feel up to being OOB in the recliner chair for a while.  She reports she has been over there before (so I know she can physically do it).  She is weak and reports feeling "awful".  I am hopeful that she can progress gait and mobility for return home at d/c (she is not ready yet).  I talked with her about SNF rehab as she reports her husband won't help her much at home, but she wants to go home and do therapy at home.   PT to follow acutely for deficits listed below.    Follow Up Recommendations Home health PT;Other (comment) (as long as she progresses with gait/mobility, discussed SNF and pt did not want that, she wants home.)    Equipment Recommendations  Other (comment) ([place] aide if able)    Recommendations for Other Services       Precautions / Restrictions Precautions Precautions: Fall;Other (comment) Precaution Comments: vomited      Mobility  Bed Mobility Overal bed mobility: Needs Assistance Bed Mobility: Supine to Sit;Sit to Supine     Supine to sit: Min assist;HOB elevated Sit to supine: Min assist;HOB elevated   General bed mobility comments: Heavy use of railing for transitions up and down and pt nearly fell out of the bed coming up.  Likely poor awareness of the edge due to poor vision.    Transfers                 General  transfer comment: Pt sat EOB for a long time, did not feel up for OOB to chair today, vomited mucus x 2.  Ambulation/Gait                Stairs            Wheelchair Mobility    Modified Rankin (Stroke Patients Only)       Balance Overall balance assessment: Needs assistance Sitting-balance support: Feet supported;Bilateral upper extremity supported;No upper extremity supported;Single extremity supported Sitting balance-Leahy Scale: Fair                                       Pertinent Vitals/Pain Pain Assessment: Faces Faces Pain Scale: Hurts even more Pain Location: chronic low back pain Pain Descriptors / Indicators: Grimacing;Guarding Pain Intervention(s): Limited activity within patient's tolerance;Monitored during session;Repositioned    Home Living Family/patient expects to be discharged to:: Private residence Living Arrangements: Spouse/significant other Available Help at Discharge: Family;Available 24 hours/day (however, pt reports spouse is unlikely to help much, supervision) Type of Home: House Home Access: Stairs to enter Entrance Stairs-Rails: Right;Left;Can reach both Entrance Stairs-Number of Steps: 3 Home Layout: One level Home Equipment: Walker - 2 wheels;Walker - 4 wheels;Cane -  single point;Shower seat      Prior Function Level of Independence: Independent         Comments: recent fall which is why her husband called the ambulance to be admitted, pt does not drive to to very poor vision     Hand Dominance   Dominant Hand: Right    Extremity/Trunk Assessment   Upper Extremity Assessment Upper Extremity Assessment: Defer to OT evaluation (L UE tremor is new)    Lower Extremity Assessment Lower Extremity Assessment: Generalized weakness;RLE deficits/detail;LLE deficits/detail (3+-4/5 per seated MMT) RLE Sensation: history of peripheral neuropathy (significantly worse) LLE Sensation: history of peripheral  neuropathy (significantly worse)    Cervical / Trunk Assessment Cervical / Trunk Assessment: Other exceptions Cervical / Trunk Exceptions: pt reports h/o low back arthritis  Communication   Communication: No difficulties  Cognition Arousal/Alertness: Awake/alert Behavior During Therapy:  (tearful throughout session) Overall Cognitive Status: Within Functional Limits for tasks assessed                                 General Comments: not specifically tested, but history questions matched what was already in the chart.      General Comments General comments (skin integrity, edema, etc.): Pt on 1 L O2 [place], reports she does not use O2 at home, did not do HD before this admission.    Exercises     Assessment/Plan    PT Assessment Patient needs continued PT services  PT Problem List Decreased strength;Decreased activity tolerance;Decreased balance;Decreased mobility;Decreased knowledge of use of DME;Cardiopulmonary status limiting activity;Obesity;Impaired sensation;Pain       PT Treatment Interventions DME instruction;Gait training;Stair training;Functional mobility training;Therapeutic activities;Therapeutic exercise;Balance training;Patient/family education    PT Goals (Current goals can be found in the Care Plan section)  Acute Rehab PT Goals Patient Stated Goal: to feel better, get stronger and go home. PT Goal Formulation: With patient Time For Goal Achievement: 11/11/20 Potential to Achieve Goals: Good    Frequency Min 3X/week   Barriers to discharge        Co-evaluation               AM-PAC PT "6 Clicks" Mobility  Outcome Measure Help needed turning from your back to your side while in a flat bed without using bedrails?: A Little Help needed moving from lying on your back to sitting on the side of a flat bed without using bedrails?: A Little Help needed moving to and from a bed to a chair (including a wheelchair)?: A Little Help needed standing  up from a chair using your arms (e.g., wheelchair or bedside chair)?: A Little Help needed to walk in hospital room?: A Lot Help needed climbing 3-5 steps with a railing? : A Lot 6 Click Score: 16    End of Session Equipment Utilized During Treatment: Oxygen Activity Tolerance: Patient limited by fatigue;Patient limited by pain;Other (comment) (limited by N/V, did not want meds from RN) Patient left: in bed;with call bell/phone within reach;with bed alarm set   PT Visit Diagnosis: Muscle weakness (generalized) (M62.81);Difficulty in walking, not elsewhere classified (R26.2);Pain Pain - Right/Left:  (lower) Pain - part of body:  (back)    Time: [id number] PT Time Calculation (min) (ACUTE ONLY): 26 min   Charges:   PT Evaluation $PT Eval Moderate Complexity: 1 Mod PT Treatments $Therapeutic Activity: 8-22 mins        Verdene Lennert, PT, DPT  Acute Rehabilitation #[phone] pager #[phone] office

## 2020-10-28 NOTE — Progress Notes (Addendum)
[facility] Progress Note   Assessment/ Plan:   1. ESRD     NAGMA     - Followed by Dr.Kruska. Last office visit (12/3), Cr 2.78 and BUN of 101     - CKD has been attributed to DM2 and HTN      - CKD 4 has progressed in setting of n/v/d with Covid-19 infection to ESRD on admission.       - Patient had tunneled cath placed 1/20 and underwent HD#1      - Patient with good approximately a 1 L UO yesterday, BUN 99 >105 , 5.05 > 4.72       - Plan for HD #2 today  2. Acute hypoxic respiratory failure in setting of Covid-19 Pneumonia      - Stable , Sating well on room air during exam.      - Completed Remdesivir and Barcitinib, steroids discontinued after 3 days for hyperglycemia  3.  Anemia of CKD - notable labs : MCV 87, Hgb 8.9, Iron 25, TIBC 186  TSAT 13, Ferritin 604 - Iron/TIBC 13%  - hold ESA ,  recommend iron supplementation. Orders per Dr.Bhandari  4. CKD-MBD:       - Continue Phoslo   5. Anxiety - Patient has baseline anxiety. Unstable anxiety this morning on my exam. -  Hydroxizine 10 mg once , started at 50% dose in setting of ESRD -  Will defer to primary team for further evaluation and treatment.   6.. Nutrition: Renal/Carb Modified     See attending addendum for final recommendations.  Subjective:   Latasha [name] is a 65 y.o. with PMH of HTN,HLD,DM2,HFpEF, and CKD4 admitted for acute hypoxic respiratory failure 2/2 Covid-19 pneumonia on hospital day 6. Seeing in consult for AKI on CKD 4.   Patient very anxious on exam. Her chronic back pain continues to hurt and she is experiencing nausea. Her anxiety make conversation difficult. She is aware of plan for HD today.     Objective:   BP (!) 144/61 (BP Location: Right Arm)   Pulse (!) 58   Temp 98.5 F (36.9 C) (Oral)   Resp 20   Wt (!) 138.9 kg   SpO2 98%   BMI 51.75 kg/m   Physical Exam: Gen: chronically ill appearing , obese CVS:RRR, no murmurs, rubs, or gallops.  Resp: CTA anteriorly,  decreased bilateral bases, no wheezing  Abd:Bowel sounds present, soft [id number] and well perfused  Psych: Mood anxious, affect anxious   Labs: BMET Recent Labs  Lab 10/24/20 1338 10/24/20 1729 10/25/20 0551 10/25/20 1739 10/26/20 0805 10/27/20 0328 10/28/20 0205  NA 131* 133* 134* 135 138 140 141  K 6.0* 5.8* 5.7* 5.6* 5.2* 3.8 4.3  CL 102 104 105 106 106 107 106  CO2 15* 14* 15* 14* 14* 20* 20*  GLUCOSE 353* 347* 259* 165* 126* 55* 99  BUN 131* 134* 141* 152* 161* 99* 105*  CREATININE 7.34* 7.30* 7.72* 7.98* 8.22* 5.05* 4.72*  CALCIUM 6.4* 6.2* 6.5* 6.4* 6.6* 6.8* 7.0*  PHOS  --  8.8*  --   --  10.4*  --   --    CBC Recent Labs  Lab 10/25/20 0551 10/26/20 0718 10/27/20 0328 10/28/20 0205  WBC 7.3 8.6 8.2 9.8  HGB 7.9* 8.0* 8.2* 8.9*  HCT 25.2* 26.2* 25.7* 27.7*  MCV 91.6 91.9 87.4 87.9  PLT 194 216 225 274      Medications:    . amLODipine  10  mg Oral Daily  . aspirin EC  81 mg Oral Daily  . calcium acetate  1,334 mg Oral TID WC  . carvedilol  25 mg Oral BID WC  . Chlorhexidine Gluconate Cloth  6 each Topical Daily  . Chlorhexidine Gluconate Cloth  6 each Topical Q0600  . cycloSPORINE  1 drop Both Eyes BID  . darbepoetin (ARANESP) injection - DIALYSIS  60 mcg Intravenous Q Sat-HD  . heparin injection (subcutaneous)  7,500 Units Subcutaneous Q8H  . insulin aspart  0-5 Units Subcutaneous QHS  . insulin aspart  0-9 Units Subcutaneous TID WC     Tamsen Snider, MD PGY2 IM 10/28/2020, 8:02 AM

## 2020-10-29 DIAGNOSIS — U071 COVID-19: Secondary | ICD-10-CM | POA: Diagnosis not present

## 2020-10-29 DIAGNOSIS — J1282 Pneumonia due to coronavirus disease 2019: Secondary | ICD-10-CM | POA: Diagnosis not present

## 2020-10-29 LAB — CBC
HCT: 24.2 % — ABNORMAL LOW (ref 36.0–46.0)
Hemoglobin: 8 g/dL — ABNORMAL LOW (ref 12.0–15.0)
MCH: 28.8 pg (ref 26.0–34.0)
MCHC: 33.1 g/dL (ref 30.0–36.0)
MCV: 87.1 fL (ref 80.0–100.0)
Platelets: 283 10*3/uL (ref 150–400)
RBC: 2.78 MIL/uL — ABNORMAL LOW (ref 3.87–5.11)
RDW: 14 % (ref 11.5–15.5)
WBC: 10.9 10*3/uL — ABNORMAL HIGH (ref 4.0–10.5)
nRBC: 0 % (ref 0.0–0.2)

## 2020-10-29 LAB — BASIC METABOLIC PANEL
Anion gap: 12 (ref 5–15)
BUN: 98 mg/dL — ABNORMAL HIGH (ref 8–23)
CO2: 22 mmol/L (ref 22–32)
Calcium: 7.2 mg/dL — ABNORMAL LOW (ref 8.9–10.3)
Chloride: 107 mmol/L (ref 98–111)
Creatinine, Ser: 4.03 mg/dL — ABNORMAL HIGH (ref 0.44–1.00)
GFR, Estimated: 12 mL/min — ABNORMAL LOW (ref >60)
Glucose, Bld: 185 mg/dL — ABNORMAL HIGH (ref 70–99)
Potassium: 4.3 mmol/L (ref 3.5–5.1)
Sodium: 141 mmol/L (ref 135–145)

## 2020-10-29 LAB — C-REACTIVE PROTEIN: CRP: 10.3 mg/dL — ABNORMAL HIGH (ref <1.0)

## 2020-10-29 LAB — GLUCOSE, CAPILLARY
Glucose-Capillary: 158 mg/dL — ABNORMAL HIGH (ref 70–99)
Glucose-Capillary: 137 mg/dL — ABNORMAL HIGH (ref 70–99)
Glucose-Capillary: 162 mg/dL — ABNORMAL HIGH (ref 70–99)
Glucose-Capillary: 165 mg/dL — ABNORMAL HIGH (ref 70–99)
Glucose-Capillary: 148 mg/dL — ABNORMAL HIGH (ref 70–99)

## 2020-10-29 LAB — D-DIMER, QUANTITATIVE: D-Dimer, Quant: 6.57 ug/mL-FEU — ABNORMAL HIGH (ref 0.00–0.50)

## 2020-10-29 MED ORDER — ZOLPIDEM TARTRATE 5 MG PO TABS
2.5000 mg | ORAL_TABLET | Freq: Once | ORAL | Status: AC
  Administered 2020-10-29: 2.5 mg via ORAL
  Filled 2020-10-29: qty 1

## 2020-10-29 MED ORDER — LORAZEPAM 0.5 MG PO TABS
0.5000 mg | ORAL_TABLET | Freq: Once | ORAL | Status: AC
  Administered 2020-10-29: 0.5 mg via ORAL
  Filled 2020-10-29: qty 1

## 2020-10-29 NOTE — Progress Notes (Signed)
[facility] NEPHROLOGY PROGRESS NOTE  Assessment/ Plan: Pt is a 65 y.o. yo female with PMH of HTN,HLD,DM2,HFpEF, and CKD4 admitted for acute hypoxic respiratory failure 2/2 Covid-19 pneumonia, consulted for AKI on CKD 4.    #AKI on CKD stage IV: Probably due to COVID-pneumonia.  CKD was thought to be due to DM and hypertension and followed by Dr. Johnney Ou, last visit on 12/3 with creatinine level 2.78 and BUN 101.  She had tunneled HD catheter placed by IR on 1/20 and started dialysis. Urine output 1.8 L and creatinine level trending down after dialysis.  It seems like she is having renal recovery therefore holding further dialysis.  Strict ins and out, daily weight and watch for renal recovery.  #Acute hypoxic respiratory failure in the setting of COVID-pneumonia: Completed remdesivir and baricitinib.  Now is stable and in room air.  #Anemia of CKD: Iron saturation 13% therefore starting IV iron and ESA.  Monitor hemoglobin.  #CKD MBD: Continue PhosLo.  #Hypertension/volume: BP acceptable continue current antihypertensive medication.  Some edema, expect increase urination as with renal recovery.  Subjective: Seen and examined at bedside.  Reports hungry and feeling cold.  Urine output 1.8 L.  The creatinine level trending down to 4 today.  No nausea, vomiting, dysgeusia, chest pain or shortness of breath. Objective Vital signs in last 24 hours: Vitals:   10/28/20 0836 10/28/20 1710 10/28/20 2100 10/29/20 0500  BP: (!) 152/71 (!) 154/69 (!) 139/58 (!) 148/63  Pulse: 67 70 65 67  Resp: 19 19    Temp: 98 F (36.7 C) 97.9 F (36.6 C) 97.7 F (36.5 C) 98.2 F (36.8 C)  TempSrc: Oral Oral Oral Oral  SpO2: 93% 91% 93% 93%  Weight:       Weight change:   Intake/Output Summary (Last 24 hours) at 10/29/2020 0936 Last data filed at 10/29/2020 0600 Gross per 24 hour  Intake 600 ml  Output 1800 ml  Net -1200 ml       Labs: Basic Metabolic Panel: Recent Labs  Lab  10/24/20 1729 10/25/20 0551 10/26/20 0805 10/27/20 0328 10/28/20 0205 10/29/20 0200  NA 133*   < > 138 140 141 141  K 5.8*   < > 5.2* 3.8 4.3 4.3  CL 104   < > 106 107 106 107  CO2 14*   < > 14* 20* 20* 22  GLUCOSE 347*   < > 126* 55* 99 185*  BUN 134*   < > 161* 99* 105* 98*  CREATININE 7.30*   < > 8.22* 5.05* 4.72* 4.03*  CALCIUM 6.2*   < > 6.6* 6.8* 7.0* 7.2*  PHOS 8.8*  --  10.4*  --   --   --    < > = values in this interval not displayed.   Liver Function Tests: Recent Labs  Lab 10/23/20 0941 10/24/20 1729 10/26/20 0805  AST 46*  --   --   ALT 52*  --   --   ALKPHOS 121  --   --   BILITOT 0.5  --   --   PROT 6.6  --   --   ALBUMIN 2.7* 2.3* 2.6*   No results for input(s): LIPASE, AMYLASE in the last 168 hours. No results for input(s): AMMONIA in the last 168 hours. CBC: Recent Labs  Lab 10/25/20 0551 10/26/20 0718 10/27/20 0328 10/28/20 0205 10/29/20 0200  WBC 7.3 8.6 8.2 9.8 10.9*  HGB 7.9* 8.0* 8.2* 8.9* 8.0*  HCT 25.2* 26.2*  25.7* 27.7* 24.2*  MCV 91.6 91.9 87.4 87.9 87.1  PLT 194 216 225 274 283   Cardiac Enzymes: Recent Labs  Lab 10/24/20 1338  CKTOTAL 1,198*   CBG: Recent Labs  Lab 10/28/20 1137 10/28/20 1538 10/28/20 2111 10/29/20 0547 10/29/20 0912  GLUCAP 126* 195* 165* 158* 137*    Iron Studies:  Recent Labs    10/28/20 0205  IRON 25*  TIBC 186*  FERRITIN 604*   Studies/Results: No results found.  Medications: Infusions: . sodium chloride 1,000 mL (10/24/20 1648)  . ferric gluconate (FERRLECIT/NULECIT) IV 250 mg (10/28/20 1437)    Scheduled Medications: . amLODipine  10 mg Oral Daily  . aspirin EC  81 mg Oral Daily  . calcium acetate  1,334 mg Oral TID WC  . carvedilol  25 mg Oral BID WC  . Chlorhexidine Gluconate Cloth  6 each Topical Daily  . Chlorhexidine Gluconate Cloth  6 each Topical Q0600  . cycloSPORINE  1 drop Both Eyes BID  . darbepoetin (ARANESP) injection - DIALYSIS  60 mcg Intravenous Q Sat-HD  .  heparin injection (subcutaneous)  7,500 Units Subcutaneous Q8H  . insulin aspart  0-5 Units Subcutaneous QHS  . insulin aspart  0-9 Units Subcutaneous TID WC    have reviewed scheduled and prn medications.  Physical Exam: General:NAD, comfortable Heart:RRR, s1s2 nl Lungs:clear b/l, no crackle Abdomen:soft, Non-tender, non-distended Extremities: Lower extremity edema present Dialysis Access: Right IJ TDC  Sarrinah Gardin Prasad Sari Cogan 10/29/2020,9:36 AM  LOS: 7 days

## 2020-10-29 NOTE — Progress Notes (Signed)
PROGRESS NOTE    Latasha [name]  [id number] DOB: [birth year] DOA: 10/22/2020 PCP: Shirline Frees, MD   Brief Narrative:   Latasha [name] a 65 y.o.femalewith medical history significant forchronic congestive heart failure with diastolic dysfunction, chronic kidney disease stage IV, diabetes mellitus hypertension morbid obesity who presented to the emergency department this morning with 8 to 9 days of cough fever malaise generalized weakness intermittent vomiting diarrhea and shortness of breath - started feeling worse with coughing not eating well fatigued and worsening shortness of breath, worsening swelling of her legs and feet. Has received 2 doses of Pfizer vaccine last vaccine April 2021 but has not received her booster dose. Patient stated that her son tested positive for COVID and she has been around him. EMS they found patient with a saturation of 76% on room air and initially placed on 6 L which increased oxygenation to 87 and then she had to have a nonrebreather oxygen which increased her oxygen to 96% currently she is on room air at 5 L/min in the emergency department.  10/29/20: No acute events ON. She states that she is feeling much better. She's on RA and satting well. SCr downtrending. Appreciate nephro help.    Assessment & Plan:  Acute metabolic encephalopathy, multifactorial, resolving     - secondary to uremia for renal failure and hypoxia from COVID 19  Acute hypoxic respiratory failure secondary to covid 19 pneumonia COVID 19 PNA     - completed remdesivir 1/20; continue barcitinib, per protocol     - steroids d/c d/t hyperglycemia, mentation change and rapidly improving COVID     - ambulation, proning, IS, FV     - pt has 2 vaccines but no booster - more than 6 months since second vaccine.     - now on RA     - d-dimer is trending up, but her respiratory status is improving; follow  AKI on CKD4     - nephro onboard, has had iHD, watching for renal recovery  today     - renal US ok     - per nephrology  IDDM2 - uncontrolled with hyperglycemia     - steroids d/c'd as above given mental status changes     - SSI, DM diet, glucose checks     - stopped 70/30 insulin 1/22 given recurrent hypoglycemia despite decreasing sliding scale: due to very poor PO intake     - glucose is acceptable today  Chronic anemia of CKD Iron deficiency anemia     - getting iron  Generalized weakness     - PT recs HHPT, will ask TOC to arrange  DVT prophylaxis: heparin Code Status: FULL Family Communication: none at bedside   Status is: Inpatient  Remains inpatient appropriate because:Inpatient level of care appropriate due to severity of illness   Dispo: The patient is from: Home              Anticipated d/c is to: Home              Anticipated d/c date is: 3 days              Patient currently is not medically stable to d/c.   Difficult to place patient No  Consultants:   nephrology  Procedures:   none  Antimicrobials:  . remdesivir   Subjective: "I feel better."  Objective: Vitals:   10/28/20 0836 10/28/20 1710 10/28/20 2100 10/29/20 0500  BP: (!) 152/71 (!) 154/69 (!) 139/58 Marland Kitchen)  148/63  Pulse: 67 70 65 67  Resp: 19 19    Temp: 98 F (36.7 C) 97.9 F (36.6 C) 97.7 F (36.5 C) 98.2 F (36.8 C)  TempSrc: Oral Oral Oral Oral  SpO2: 93% 91% 93% 93%  Weight:        Intake/Output Summary (Last 24 hours) at 10/29/2020 0957 Last data filed at 10/29/2020 0600 Gross per 24 hour  Intake 600 ml  Output 1800 ml  Net -1200 ml   Filed Weights   10/26/20 1345 10/26/20 1620 10/27/20 0500  Weight: 133.3 kg 132.3 kg (!) 138.9 kg    Examination:  General: 65 y.o. female resting in bed in NAD Eyes: PERRL, normal sclera ENMT: Nares patent w/o discharge, orophaynx clear, dentition normal, ears w/o discharge/lesions/ulcers Neck: Supple, trachea midline Cardiovascular: RRR, +S1, S2, no m/g/r, equal pulses throughout Respiratory: CTABL, no  w/r/r, normal WOB GI: BS+, NDNT, no masses noted, no organomegaly noted MSK: No e/c/c Skin: No rashes, bruises, ulcerations noted Neuro: A&O x 3, no focal deficits Psyc: Appropriate interaction and affect, calm/cooperative   Data Reviewed: I have personally reviewed following labs and imaging studies.  CBC: Recent Labs  Lab 10/25/20 0551 10/26/20 0718 10/27/20 0328 10/28/20 0205 10/29/20 0200  WBC 7.3 8.6 8.2 9.8 10.9*  HGB 7.9* 8.0* 8.2* 8.9* 8.0*  HCT 25.2* 26.2* 25.7* 27.7* 24.2*  MCV 91.6 91.9 87.4 87.9 87.1  PLT 194 216 225 274 [id number]   Basic Metabolic Panel: Recent Labs  Lab 10/24/20 1729 10/25/20 0551 10/25/20 1739 10/26/20 0805 10/27/20 0328 10/28/20 0205 10/29/20 0200  NA 133*   < > 135 138 140 141 141  K 5.8*   < > 5.6* 5.2* 3.8 4.3 4.3  CL 104   < > 106 106 107 106 107  CO2 14*   < > 14* 14* 20* 20* 22  GLUCOSE 347*   < > 165* 126* 55* 99 185*  BUN 134*   < > 152* 161* 99* 105* 98*  CREATININE 7.30*   < > 7.98* 8.22* 5.05* 4.72* 4.03*  CALCIUM 6.2*   < > 6.4* 6.6* 6.8* 7.0* 7.2*  PHOS 8.8*  --   --  10.4*  --   --   --    < > = values in this interval not displayed.   GFR: CrCl cannot be calculated (Unknown ideal weight.). Liver Function Tests: Recent Labs  Lab 10/23/20 0941 10/24/20 1729 10/26/20 0805  AST 46*  --   --   ALT 52*  --   --   ALKPHOS 121  --   --   BILITOT 0.5  --   --   PROT 6.6  --   --   ALBUMIN 2.7* 2.3* 2.6*   No results for input(s): LIPASE, AMYLASE in the last 168 hours. No results for input(s): AMMONIA in the last 168 hours. Coagulation Profile: Recent Labs  Lab 10/23/20 0335  INR 1.2   Cardiac Enzymes: Recent Labs  Lab 10/24/20 1338  CKTOTAL 1,198*   BNP (last 3 results) No results for input(s): PROBNP in the last 8760 hours. HbA1C: No results for input(s): HGBA1C in the last 72 hours. CBG: Recent Labs  Lab 10/28/20 1137 10/28/20 1538 10/28/20 2111 10/29/20 0547 10/29/20 0912  GLUCAP 126* 195* 165*  158* 137*   Lipid Profile: No results for input(s): CHOL, HDL, LDLCALC, TRIG, CHOLHDL, LDLDIRECT in the last 72 hours. Thyroid Function Tests: No results for input(s): TSH, T4TOTAL, FREET4, T3FREE, THYROIDAB in the  last 72 hours. Anemia Panel: Recent Labs    10/28/20 0205  FERRITIN 604*  TIBC 186*  IRON 25*   Sepsis Labs: Recent Labs  Lab 10/23/20 0335  LATICACIDVEN 0.6    Recent Results (from the past 240 hour(s))  Blood Culture (routine x 2)     Status: None   Collection Time: 10/22/20  9:30 AM   Specimen: BLOOD  Result Value Ref Range Status   Specimen Description BLOOD SITE NOT SPECIFIED  Final   Special Requests   Final    BOTTLES DRAWN AEROBIC AND ANAEROBIC Blood Culture adequate volume   Culture   Final    NO GROWTH 5 DAYS Performed at [facility], [address], [place], [place] [address]    Report Status 10/27/2020 FINAL  Final  Blood Culture (routine x 2)     Status: None   Collection Time: 10/22/20 10:01 AM   Specimen: BLOOD RIGHT HAND  Result Value Ref Range Status   Specimen Description BLOOD RIGHT HAND  Final   Special Requests   Final    BOTTLES DRAWN AEROBIC AND ANAEROBIC Blood Culture results may not be optimal due to an inadequate volume of blood received in culture bottles   Culture   Final    NO GROWTH 5 DAYS Performed at [facility], [place] [address] [place],  [address]    Report Status 10/27/2020 FINAL  Final      Radiology Studies: No results found.   Scheduled Meds: . amLODipine  10 mg Oral Daily  . aspirin EC  81 mg Oral Daily  . calcium acetate  1,334 mg Oral TID WC  . carvedilol  25 mg Oral BID WC  . Chlorhexidine Gluconate Cloth  6 each Topical Daily  . Chlorhexidine Gluconate Cloth  6 each Topical Q0600  . cycloSPORINE  1 drop Both Eyes BID  . darbepoetin (ARANESP) injection - DIALYSIS  60 mcg Intravenous Q Sat-HD  . heparin injection (subcutaneous)  7,500 Units Subcutaneous Q8H  . insulin aspart  0-5  Units Subcutaneous QHS  . insulin aspart  0-9 Units Subcutaneous TID WC   Continuous Infusions: . sodium chloride 1,000 mL (10/24/20 1648)  . ferric gluconate (FERRLECIT/NULECIT) IV 250 mg (10/28/20 1437)     LOS: 7 days    Time spent: 35 minutes spent in the coordination of care today.   Jonnie Finner, DO [facility]  If 7PM-7AM, please contact night-coverage [email or web address] 10/29/2020, 9:57 AM

## 2020-10-29 NOTE — Progress Notes (Signed)
Pt had an unwitnessed fall, NT and RN assisted pt to chair then bed. No injury noted, pt has no complaints, stated she was trying to move the footrest of her recliner when she fell. Marylyn Ishihara, MD notified.

## 2020-10-29 NOTE — Plan of Care (Signed)
  Problem: Respiratory: Goal: Will maintain a patent airway Outcome: Progressing   Problem: Clinical Measurements: Goal: Respiratory complications will improve Outcome: Progressing   Problem: Coping: Goal: Level of anxiety will decrease Outcome: Progressing   Problem: Pain Managment: Goal: General experience of comfort will improve Outcome: Progressing

## 2020-10-30 ENCOUNTER — Inpatient Hospital Stay (HOSPITAL_COMMUNITY): Payer: Medicare HMO

## 2020-10-30 DIAGNOSIS — J1282 Pneumonia due to coronavirus disease 2019: Secondary | ICD-10-CM | POA: Diagnosis not present

## 2020-10-30 DIAGNOSIS — U071 COVID-19: Secondary | ICD-10-CM | POA: Diagnosis not present

## 2020-10-30 LAB — GLUCOSE, CAPILLARY
Glucose-Capillary: 18 mg/dL — CL (ref 70–99)
Glucose-Capillary: 181 mg/dL — ABNORMAL HIGH (ref 70–99)
Glucose-Capillary: 158 mg/dL — ABNORMAL HIGH (ref 70–99)
Glucose-Capillary: 108 mg/dL — ABNORMAL HIGH (ref 70–99)
Glucose-Capillary: 166 mg/dL — ABNORMAL HIGH (ref 70–99)

## 2020-10-30 LAB — BASIC METABOLIC PANEL
Anion gap: 11 (ref 5–15)
BUN: 84 mg/dL — ABNORMAL HIGH (ref 8–23)
CO2: 22 mmol/L (ref 22–32)
Calcium: 7.6 mg/dL — ABNORMAL LOW (ref 8.9–10.3)
Chloride: 109 mmol/L (ref 98–111)
Creatinine, Ser: 3.53 mg/dL — ABNORMAL HIGH (ref 0.44–1.00)
GFR, Estimated: 14 mL/min — ABNORMAL LOW (ref >60)
Glucose, Bld: 149 mg/dL — ABNORMAL HIGH (ref 70–99)
Potassium: 4.4 mmol/L (ref 3.5–5.1)
Sodium: 142 mmol/L (ref 135–145)

## 2020-10-30 LAB — CBC
HCT: 23.9 % — ABNORMAL LOW (ref 36.0–46.0)
Hemoglobin: 7.6 g/dL — ABNORMAL LOW (ref 12.0–15.0)
MCH: 28.6 pg (ref 26.0–34.0)
MCHC: 31.8 g/dL (ref 30.0–36.0)
MCV: 89.8 fL (ref 80.0–100.0)
Platelets: 296 10*3/uL (ref 150–400)
RBC: 2.66 MIL/uL — ABNORMAL LOW (ref 3.87–5.11)
RDW: 14.2 % (ref 11.5–15.5)
WBC: 11.7 10*3/uL — ABNORMAL HIGH (ref 4.0–10.5)
nRBC: 0 % (ref 0.0–0.2)

## 2020-10-30 LAB — D-DIMER, QUANTITATIVE: D-Dimer, Quant: 3.81 ug/mL-FEU — ABNORMAL HIGH (ref 0.00–0.50)

## 2020-10-30 LAB — C-REACTIVE PROTEIN: CRP: 10.2 mg/dL — ABNORMAL HIGH (ref <1.0)

## 2020-10-30 MED ORDER — HEPARIN SODIUM (PORCINE) 1000 UNIT/ML IJ SOLN
INTRAMUSCULAR | Status: AC
  Filled 2020-10-30: qty 4

## 2020-10-30 MED ORDER — DIPHENHYDRAMINE HCL 25 MG PO CAPS
25.0000 mg | ORAL_CAPSULE | Freq: Three times a day (TID) | ORAL | Status: DC | PRN
  Administered 2020-10-30 – 2020-10-31 (×3): 25 mg via ORAL
  Filled 2020-10-30 (×3): qty 1

## 2020-10-30 NOTE — Progress Notes (Signed)
[facility] NEPHROLOGY PROGRESS NOTE  Assessment/ Plan: Pt is a 66 y.o. yo female with PMH of HTN,HLD,DM2,HFpEF, and CKD4 admitted for acute hypoxic respiratory failure 2/2 Covid-19 pneumonia, consulted for AKI on CKD 4.    #AKI on CKD stage IV: Presumed secondary to to COVID-pneumonia.  CKD from DM and hypertension and followed by Dr. Johnney Ou (myself), last visit on 12/3 with creatinine level 2.78 and BUN 101.  She had tunneled HD catheter placed by IR on 1/20 and started dialysis. Urine output 1.9 L and creatinine level trending down after dialysis.  It seems like she is having renal recovery therefore will cont holding further dialysis.  Strict ins and out, daily weight and watch for renal recovery. D/C IVF today to ensure she can maintain PO intake and to limit further edema.  Leave TDC for now but if renal function continues to improve and maintains normal electrolytes and volume can d/c it in the coming days.   #Acute hypoxic respiratory failure in the setting of COVID-pneumonia: Completed remdesivir and baricitinib.  Now is stable and in room air.  #Anemia of CKD: Iron saturation 13% therefore starting IV iron and ESA.  Monitor hemoglobin - currently mid 7s.  #CKD MBD: Phos was 10.4 = started PhosLo.  Check PTH.   #Hypertension/volume: BP acceptable continue current antihypertensive medication.  Some edema, expect increase urination as with renal recovery.  Dispo - she is quite anxious this am saying just let her go home to die -- I explained to her that it doesn't seem that death is imminent and I recommended watching her another few days in the hospital.  She is agreeable.   Subjective: Seen and examined at bedside.  Reports wanting to go home. Urine output 1.975 L.  The creatinine level trending down to 3.5 today.  No nausea, vomiting, dysgeusia, chest pain or shortness of breath.  Objective Vital signs in last 24 hours: Vitals:   10/28/20 0836 10/28/20 1710 10/28/20  2100 10/29/20 0500  BP: (!) 152/71 (!) 154/69 (!) 139/58 (!) 148/63  Pulse: 67 70 65 67  Resp: 19 19    Temp: 98 F (36.7 C) 97.9 F (36.6 C) 97.7 F (36.5 C) 98.2 F (36.8 C)  TempSrc: Oral Oral Oral Oral  SpO2: 93% 91% 93% 93%  Weight:       Weight change:   Intake/Output Summary (Last 24 hours) at 10/30/2020 0946 Last data filed at 10/30/2020 0528 Gross per 24 hour  Intake 480 ml  Output 1975 ml  Net -1495 ml       Labs: Basic Metabolic Panel: Recent Labs  Lab 10/24/20 1729 10/25/20 0551 10/26/20 0805 10/27/20 0328 10/28/20 0205 10/29/20 0200 10/30/20 0414  NA 133*   < > 138   < > 141 141 142  K 5.8*   < > 5.2*   < > 4.3 4.3 4.4  CL 104   < > 106   < > 106 107 109  CO2 14*   < > 14*   < > 20* 22 22  GLUCOSE 347*   < > 126*   < > 99 185* 149*  BUN 134*   < > 161*   < > 105* 98* 84*  CREATININE 7.30*   < > 8.22*   < > 4.72* 4.03* 3.53*  CALCIUM 6.2*   < > 6.6*   < > 7.0* 7.2* 7.6*  PHOS 8.8*  --  10.4*  --   --   --   --    < > =  values in this interval not displayed.   Liver Function Tests: Recent Labs  Lab 10/24/20 1729 10/26/20 0805  ALBUMIN 2.3* 2.6*   No results for input(s): LIPASE, AMYLASE in the last 168 hours. No results for input(s): AMMONIA in the last 168 hours. CBC: Recent Labs  Lab 10/26/20 0718 10/27/20 0328 10/28/20 0205 10/29/20 0200 10/30/20 0414  WBC 8.6 8.2 9.8 10.9* 11.7*  HGB 8.0* 8.2* 8.9* 8.0* 7.6*  HCT 26.2* 25.7* 27.7* 24.2* 23.9*  MCV 91.9 87.4 87.9 87.1 89.8  PLT 216 225 274 283 296   Cardiac Enzymes: Recent Labs  Lab 10/24/20 1338  CKTOTAL 1,198*   CBG: Recent Labs  Lab 10/29/20 0912 10/29/20 1337 10/29/20 1729 10/29/20 2046 10/30/20 0834  GLUCAP 137* 162* 165* 148* 181*    Iron Studies:  Recent Labs    10/28/20 0205  IRON 25*  TIBC 186*  FERRITIN 604*   Studies/Results: No results found.  Medications: Infusions: . sodium chloride 1,000 mL (10/30/20 [id number])    Scheduled Medications: .  amLODipine  10 mg Oral Daily  . aspirin EC  81 mg Oral Daily  . calcium acetate  1,334 mg Oral TID WC  . carvedilol  25 mg Oral BID WC  . Chlorhexidine Gluconate Cloth  6 each Topical Daily  . Chlorhexidine Gluconate Cloth  6 each Topical Q0600  . cycloSPORINE  1 drop Both Eyes BID  . darbepoetin (ARANESP) injection - DIALYSIS  60 mcg Intravenous Q Sat-HD  . heparin injection (subcutaneous)  7,500 Units Subcutaneous Q8H  . insulin aspart  0-5 Units Subcutaneous QHS  . insulin aspart  0-9 Units Subcutaneous TID WC    have reviewed scheduled and prn medications.  Physical Exam: General:NAD, comfortable Heart:RRR, s1s2 nl Lungs:clear b/l, no crackle Abdomen:soft, Non-tender, non-distended Extremities: Lower extremity edema present - 1+ to thigh Dialysis Access: Right IJ TDC c/d/i  Justin Mend 10/30/2020,9:46 AM  LOS: 8 days

## 2020-10-30 NOTE — Evaluation (Signed)
Occupational Therapy Evaluation Patient Details Name: Latasha [name] MRN: [id number] DOB: Feb 03, 1956 Today's Date: 10/30/2020    History of Present Illness 65 y.o. female admitted on 10/22/20 forSOB, fatigue/weakness, increase swelling in bil LEs.  Pt dx with acute hypoxic respiratory failure due to COVID 19 PNA.  AKI on CKD stage IV now on iHD s/p vascular access R chest on 10/26/20, hyperkalemia.  Pt with significant PMH of morbid obesity, HTN, DM, CHF, BPPV, knee surgery.   Clinical Impression   PTA, pt lives with spouse and reports Modified Independence with ADLs, IADLs in the home with use of Rollator. Pt presents now with deficits in cardiopulmonary tolerance, dynamic standing balance and knowledge of appropriate DME use. Pt received on RA with quick desats during mobility, so ultimately pt left on 1 L O2. Pt Min A for mobility to/from bathroom using RW - assistance needed to turn, maneuver and avoid obstacles with RW. Pt requires Setup for UB ADLs and Mod A for LB ADLs (uses AE for LB dressing at home). Pt determined to return home and recover from [place]. Educated on energy conservation strategies and plan to provide handout during next session. Pt reports her husband, daughter can assist her at home though unsure of their COVID status. Recommend HHOT and 24/7 at this time. If pt slow to progress, consider SNF for short term rehab.   Pt received on RA, SpO2 90% at rest. Desats to 80% after first bout of mobility, 76% after second bout of mobility. Pt requires > 3 minutes seated rest break to recover. Sustained 83% at end of session, so applied 1 L O2 with return to 90% <1 minute.     Follow Up Recommendations  Home health OT;Supervision/Assistance - 24 hour (consider SNF if 24/7 assist not available)    Equipment Recommendations  3 in 1 bedside commode    Recommendations for Other Services       Precautions / Restrictions Precautions Precautions: Fall;Other (comment) Precaution  Comments: monitor O2 Restrictions Weight Bearing Restrictions: No      Mobility Bed Mobility Overal bed mobility: Needs Assistance Bed Mobility: Sit to Supine       Sit to supine: Supervision   General bed mobility comments: Supervision for line safety    Transfers Overall transfer level: Needs assistance Equipment used: Rolling walker (2 wheeled) Transfers: Sit to/from [facility] Sit to Stand: Min guard Stand pivot transfers: Min assist       General transfer comment: min guard for sit to stand, increased time/effort and heavy use of grab bar in bathroom. Min A for turning with RW, assistance to manuever and navigate obstacles. Cues to keep B UE on RW as pt tending to attempt to furniture walk and use RW at same time    Balance Overall balance assessment: Needs assistance Sitting-balance support: Feet supported;Bilateral upper extremity supported;No upper extremity supported;Single extremity supported Sitting balance-Leahy Scale: Fair     Standing balance support: Single extremity supported;Bilateral upper extremity supported;During functional activity Standing balance-Leahy Scale: Poor Standing balance comment: reliant on at least one UE in standing, B UE for dynamic tasks                           ADL either performed or assessed with clinical judgement   ADL Overall ADL's : Needs assistance/impaired Eating/Feeding: Sitting;Independent   Grooming: Min guard;Standing   Upper Body Bathing: Set up;Sitting   Lower Body Bathing: Moderate assistance;Sit to/from stand  Upper Body Dressing : Set up;Sitting   Lower Body Dressing: Moderate assistance;Sit to/from stand Lower Body Dressing Details (indicate cue type and reason): Assistance for socks, reports using sock aide at home for task Toilet Transfer: Minimal assistance;Ambulation;BSC;Regular Toilet;RW Toilet Transfer Details (indicate cue type and reason): Min A for ambulation to/from  bathroom with RW, Assistance needed to [place] in small spaces, turning and avoiding obstacles Toileting- Clothing Manipulation and Hygiene: Minimal assistance;Sit to/from stand Toileting - Clothing Manipulation Details (indicate cue type and reason): Assist for clothing mgmt     Functional mobility during ADLs: Minimal assistance;Rolling walker General ADL Comments: Pt with deficits in cardiopulmonary tolerance, use of AD for mobility and requires rest breaks during activities. Motivated to return home     Vision Baseline Vision/History: Wears glasses Patient Visual Report: Other (comment) (hx of poor vision and no longer driving, pt did not specify) Additional Comments: Reports poor vision and not driving due to this. Did not specify visual deficits     Perception     Praxis      Pertinent Vitals/Pain Pain Assessment: Faces Faces Pain Scale: Hurts a little bit Pain Location: headache Pain Descriptors / Indicators: Headache Pain Intervention(s): Monitored during session;Other (comment) (RN notified)     Hand Dominance Right   Extremity/Trunk Assessment Upper Extremity Assessment Upper Extremity Assessment: Generalized weakness   Lower Extremity Assessment Lower Extremity Assessment: Defer to PT evaluation RLE Sensation: history of peripheral neuropathy LLE Sensation: history of peripheral neuropathy   Cervical / Trunk Assessment Cervical / Trunk Assessment: Other exceptions Cervical / Trunk Exceptions: pt reports h/o low back arthritis   Communication Communication Communication: No difficulties   Cognition Arousal/Alertness: Awake/alert Behavior During Therapy: WFL for tasks assessed/performed Overall Cognitive Status: Within Functional Limits for tasks assessed                                 General Comments: A&Ox4, did not formally test but follows all directions. tearful at times but pleasant and motivated to walk   General Comments  Pt  received on RA sitting EOB. Pt not connected to O2 monitoring so personal pulse ox used during session. 90% on RA at rest, desats to 80% after mobility to bathroom (increased to 88% with seated rest break > 3 min), 76% after mobility back to bed (increased to 0000000 but application of 1 L O2 needed to return to 90%). pt required one rest break during each bout of short mobility. Educated on energy conservation strategies    Exercises     Shoulder Instructions      Home Living Family/patient expects to be discharged to:: Private residence Living Arrangements: Spouse/significant other Available Help at Discharge: Family;Available 24 hours/day Type of Home: House Home Access: Stairs to enter [employer] of Steps: 3 Entrance Stairs-Rails: Right;Left;Can reach both Home Layout: One level     Bathroom Shower/Tub: Teacher, early years/pre: Handicapped height (BSC or toilet riser over toilet)     Home Equipment: Walker - 2 wheels;Walker - 4 wheels;Cane - single point;Shower seat;Adaptive equipment Adaptive Equipment: Reacher;Sock aid        Prior Functioning/Environment Level of Independence: Independent with assistive device(s)        Comments: recent fall which is why her husband called the ambulance to be admitted, pt does not drive to to very poor vision. Pt reports using Rollator for mobility in the home, able to complete ADLs (  uses AE for LB ADLs)        OT Problem List: Decreased strength;Decreased activity tolerance;Impaired balance (sitting and/or standing);Decreased knowledge of use of DME or AE;Cardiopulmonary status limiting activity;Decreased knowledge of precautions      OT Treatment/Interventions: Self-care/ADL training;Therapeutic exercise;Energy conservation;DME and/or AE instruction;Therapeutic activities;Patient/family education;Balance training    OT Goals(Current goals can be found in the care plan section) Acute Rehab OT Goals Patient Stated  Goal: be able to get better and go home, recover at home OT Goal Formulation: With patient Time For Goal Achievement: 11/13/20 Potential to Achieve Goals: Good ADL Goals Pt Will Perform Grooming: with modified independence;standing Pt Will Perform Lower Body Bathing: with modified independence;sit to/from stand;sitting/lateral leans Pt Will Transfer to Toilet: with modified independence;ambulating Pt Will Perform Toileting - Clothing Manipulation and hygiene: with modified independence;sitting/lateral leans;sit to/from stand Additional ADL Goal #1: Pt to verbalize at least 2 energy conservation strategies to implement during ADLs/IADLs Additional ADL Goal #2: Pt to demo ability to independently monitor SpO2 and implement pursed lip breathing to maintain O2 >88%  OT Frequency: Min 2X/week   Barriers to D/C:            Co-evaluation              AM-PAC OT "6 Clicks" Daily Activity     Outcome Measure Help from another person eating meals?: None Help from another person taking care of personal grooming?: A Little Help from another person toileting, which includes using toliet, bedpan, or urinal?: A Little Help from another person bathing (including washing, rinsing, drying)?: A Lot Help from another person to put on and taking off regular upper body clothing?: A Little Help from another person to put on and taking off regular lower body clothing?: A Lot 6 Click Score: 17   End of Session Equipment Utilized During Treatment: Gait belt;Rolling walker;Oxygen Nurse Communication: Mobility status;Other (comment) (O2 sats, need for O2 monitoring and humidifier with need for more solution)  Activity Tolerance: Patient tolerated treatment well Patient left: in bed;with call bell/phone within reach;with bed alarm set;Other (comment) (with xray tech)  OT Visit Diagnosis: Unsteadiness on feet (R26.81);Other abnormalities of gait and mobility (R26.89);Muscle weakness (generalized) (M62.81)                 Time: [id number] OT Time Calculation (min): 32 min Charges:  OT General Charges $OT Visit: 1 Visit OT Evaluation $OT Eval Moderate Complexity: 1 Mod OT Treatments $Self Care/Home Management : 8-22 mins  Layla Maw, OTR/L  Layla Maw 10/30/2020, 11:31 AM

## 2020-10-30 NOTE — Progress Notes (Signed)
PROGRESS NOTE    RUKIYA METTE  [id number] DOB: 30-Dec-1955 DOA: 10/22/2020 PCP: Shirline Frees, MD   Brief Narrative:   MILLISSA MURNANE a 65 y.o.femalewith medical history significant forchronic congestive heart failure with diastolic dysfunction, chronic kidney disease stage IV, diabetes mellitus hypertension morbid obesity who presented to the emergency department this morning with 8 to 9 days of cough fever malaise generalized weakness intermittent vomiting diarrhea and shortness of breath-started feeling worse with coughing not eating well fatigued and worsening shortness of breath,worsening swelling of her legs and feet. Has received2 doses of Pfizer vaccine last vaccineApril 2021 but has not received her booster dose. Patient stated that her son tested positive for COVID and she has been around him. EMS they found patient with a saturation of 76% on room air and initially placed on 6 L which increased oxygenation to 87 and then she had to have a nonrebreather oxygen which increased her oxygen to 96% currently she is on room air at 5 L/min in the emergency department.  1/24: Renal fxn continues to improve. Had a fall last night unwitness. XR of knee is good.   Assessment & Plan: Acute metabolic encephalopathy, multifactorial, resolving     - secondary to uremia for renal failure and hypoxia from COVID 19  Acute hypoxic respiratory failure secondary to covid 19 pneumonia COVID 19 PNA     - completed remdesivir 1/20; continue barcitinib, per protocol     - steroids d/c d/t hyperglycemia, mentation change and rapidly improving COVID     - ambulation, proning, IS, FV     - pt has 2 vaccines but no booster - more than 6 months since second vaccine.     - now on RA     - 1/24: continue barcitinib, respiratory status is good.  AKI on CKD4     - nephro onboard, has had iHD, watching for renal recovery     - renal US ok     - per nephrology     - 1/24: SCr and BUN down  today; good UOP  IDDM2 - uncontrolled with hyperglycemia     - steroids d/c'd as above given mental status changes     - SSI, DM diet, glucose checks     - stopped 70/30 insulin 1/22 given recurrent hypoglycemia despite decreasing sliding scale: due to very poor PO intake     - 1/24: glucose looks fine today  Chronic anemia of CKD Iron deficiency anemia     - getting iron     - 1/24: he Hgb continues to drop, no signs of bleed, follow  Generalized weakness     - PT recs HHPT, will ask TOC to arrange  DVT prophylaxis: heparin Code Status: FULL Family Communication: spoke with son by phone   Status is: Inpatient  Remains inpatient appropriate because:Inpatient level of care appropriate due to severity of illness   Dispo: The patient is from: Home              Anticipated d/c is to: Home              Anticipated d/c date is: 1 day              Patient currently is not medically stable to d/c.   Difficult to place patient No  Consultants:   Nephrology  Antimicrobials:  . Remdesivir   Subjective: Don't leave me."  Objective: Vitals:   10/28/20 0836 10/28/20 1710 10/28/20 2100 10/29/20 0500  BP: (!) 152/71 (!) 154/69 (!) 139/58 (!) 148/63  Pulse: 67 70 65 67  Resp: 19 19    Temp: 98 F (36.7 C) 97.9 F (36.6 C) 97.7 F (36.5 C) 98.2 F (36.8 C)  TempSrc: Oral Oral Oral Oral  SpO2: 93% 91% 93% 93%  Weight:        Intake/Output Summary (Last 24 hours) at 10/30/2020 [id number] Last data filed at 10/30/2020 [id number] Gross per 24 hour  Intake 600 ml  Output 1975 ml  Net -1375 ml   Filed Weights   10/26/20 1345 10/26/20 1620 10/27/20 0500  Weight: 133.3 kg 132.3 kg (!) 138.9 kg    Examination:  General: 65 y.o. female resting in bed in NAD Eyes: PERRL, normal sclera ENMT: Nares patent w/o discharge, orophaynx clear, dentition normal, ears w/o discharge/lesions/ulcers Neck: Supple, trachea midline Cardiovascular: RRR, +S1, S2, no m/g/r, equal pulses  throughout Respiratory: CTABL, no w/r/r, normal WOB GI: BS+, NDNT, no masses noted, no organomegaly noted MSK: No e/c/c Skin: No rashes, bruises, ulcerations noted Neuro: A&O x 3, no focal deficits Psyc: Anxious, but cooperative   Data Reviewed: I have personally reviewed following labs and imaging studies.  CBC: Recent Labs  Lab 10/26/20 0718 10/27/20 0328 10/28/20 0205 10/29/20 0200 10/30/20 0414  WBC 8.6 8.2 9.8 10.9* 11.7*  HGB 8.0* 8.2* 8.9* 8.0* 7.6*  HCT 26.2* 25.7* 27.7* 24.2* 23.9*  MCV 91.9 87.4 87.9 87.1 89.8  PLT 216 225 274 283 0000000   Basic Metabolic Panel: Recent Labs  Lab 10/24/20 1729 10/25/20 0551 10/26/20 0805 10/27/20 0328 10/28/20 0205 10/29/20 0200 10/30/20 0414  NA 133*   < > 138 140 141 141 142  K 5.8*   < > 5.2* 3.8 4.3 4.3 4.4  CL 104   < > 106 107 106 107 109  CO2 14*   < > 14* 20* 20* 22 22  GLUCOSE 347*   < > 126* 55* 99 185* 149*  BUN 134*   < > 161* 99* 105* 98* 84*  CREATININE 7.30*   < > 8.22* 5.05* 4.72* 4.03* 3.53*  CALCIUM 6.2*   < > 6.6* 6.8* 7.0* 7.2* 7.6*  PHOS 8.8*  --  10.4*  --   --   --   --    < > = values in this interval not displayed.   GFR: CrCl cannot be calculated (Unknown ideal weight.). Liver Function Tests: Recent Labs  Lab 10/23/20 0941 10/24/20 1729 10/26/20 0805  AST 46*  --   --   ALT 52*  --   --   ALKPHOS 121  --   --   BILITOT 0.5  --   --   PROT 6.6  --   --   ALBUMIN 2.7* 2.3* 2.6*   No results for input(s): LIPASE, AMYLASE in the last 168 hours. No results for input(s): AMMONIA in the last 168 hours. Coagulation Profile: No results for input(s): INR, PROTIME in the last 168 hours. Cardiac Enzymes: Recent Labs  Lab 10/24/20 1338  CKTOTAL 1,198*   BNP (last 3 results) No results for input(s): PROBNP in the last 8760 hours. HbA1C: No results for input(s): HGBA1C in the last 72 hours. CBG: Recent Labs  Lab 10/29/20 0547 10/29/20 0912 10/29/20 1337 10/29/20 1729 10/29/20 2046   GLUCAP 158* 137* 162* 165* 148*   Lipid Profile: No results for input(s): CHOL, HDL, LDLCALC, TRIG, CHOLHDL, LDLDIRECT in the last 72 hours. Thyroid Function Tests: No results for input(s): TSH,  T4TOTAL, FREET4, T3FREE, THYROIDAB in the last 72 hours. Anemia Panel: Recent Labs    10/28/20 0205  FERRITIN 604*  TIBC 186*  IRON 25*   Sepsis Labs: No results for input(s): PROCALCITON, LATICACIDVEN in the last 168 hours.  Recent Results (from the past 240 hour(s))  Blood Culture (routine x 2)     Status: None   Collection Time: 10/22/20  9:30 AM   Specimen: BLOOD  Result Value Ref Range Status   Specimen Description BLOOD SITE NOT SPECIFIED  Final   Special Requests   Final    BOTTLES DRAWN AEROBIC AND ANAEROBIC Blood Culture adequate volume   Culture   Final    NO GROWTH 5 DAYS Performed at [facility], [address], [place], [place] [address]    Report Status 10/27/2020 FINAL  Final  Blood Culture (routine x 2)     Status: None   Collection Time: 10/22/20 10:01 AM   Specimen: BLOOD RIGHT HAND  Result Value Ref Range Status   Specimen Description BLOOD RIGHT HAND  Final   Special Requests   Final    BOTTLES DRAWN AEROBIC AND ANAEROBIC Blood Culture results may not be optimal due to an inadequate volume of blood received in culture bottles   Culture   Final    NO GROWTH 5 DAYS Performed at [facility], [place] [address] [place], [place] [address]    Report Status 10/27/2020 FINAL  Final      Radiology Studies: No results found.   Scheduled Meds: . amLODipine  10 mg Oral Daily  . aspirin EC  81 mg Oral Daily  . calcium acetate  1,334 mg Oral TID WC  . carvedilol  25 mg Oral BID WC  . Chlorhexidine Gluconate Cloth  6 each Topical Daily  . Chlorhexidine Gluconate Cloth  6 each Topical Q0600  . cycloSPORINE  1 drop Both Eyes BID  . darbepoetin (ARANESP) injection - DIALYSIS  60 mcg Intravenous Q Sat-HD  . heparin injection (subcutaneous)  7,500 Units  Subcutaneous Q8H  . insulin aspart  0-5 Units Subcutaneous QHS  . insulin aspart  0-9 Units Subcutaneous TID WC   Continuous Infusions: . sodium chloride 1,000 mL (10/30/20 0643)     LOS: 8 days    Time spent: 25 minutes spent in the coordination of care.   Jonnie Finner, DO [facility]  If 7PM-7AM, please contact night-coverage [email or web address] 10/30/2020, 7:12 AM

## 2020-10-31 DIAGNOSIS — J1282 Pneumonia due to coronavirus disease 2019: Secondary | ICD-10-CM | POA: Diagnosis not present

## 2020-10-31 DIAGNOSIS — U071 COVID-19: Secondary | ICD-10-CM | POA: Diagnosis not present

## 2020-10-31 LAB — CBC
HCT: 23.5 % — ABNORMAL LOW (ref 36.0–46.0)
Hemoglobin: 7.6 g/dL — ABNORMAL LOW (ref 12.0–15.0)
MCH: 28.9 pg (ref 26.0–34.0)
MCHC: 32.3 g/dL (ref 30.0–36.0)
MCV: 89.4 fL (ref 80.0–100.0)
Platelets: 286 10*3/uL (ref 150–400)
RBC: 2.63 MIL/uL — ABNORMAL LOW (ref 3.87–5.11)
RDW: 14.4 % (ref 11.5–15.5)
WBC: 9.5 10*3/uL (ref 4.0–10.5)
nRBC: 0 % (ref 0.0–0.2)

## 2020-10-31 LAB — GLUCOSE, CAPILLARY
Glucose-Capillary: 169 mg/dL — ABNORMAL HIGH (ref 70–99)
Glucose-Capillary: 169 mg/dL — ABNORMAL HIGH (ref 70–99)
Glucose-Capillary: 170 mg/dL — ABNORMAL HIGH (ref 70–99)
Glucose-Capillary: 182 mg/dL — ABNORMAL HIGH (ref 70–99)

## 2020-10-31 LAB — BASIC METABOLIC PANEL
Anion gap: 11 (ref 5–15)
BUN: 47 mg/dL — ABNORMAL HIGH (ref 8–23)
CO2: 24 mmol/L (ref 22–32)
Calcium: 7.8 mg/dL — ABNORMAL LOW (ref 8.9–10.3)
Chloride: 104 mmol/L (ref 98–111)
Creatinine, Ser: 2.61 mg/dL — ABNORMAL HIGH (ref 0.44–1.00)
GFR, Estimated: 20 mL/min — ABNORMAL LOW (ref >60)
Glucose, Bld: 170 mg/dL — ABNORMAL HIGH (ref 70–99)
Potassium: 4.3 mmol/L (ref 3.5–5.1)
Sodium: 139 mmol/L (ref 135–145)

## 2020-10-31 MED ORDER — FUROSEMIDE 80 MG PO TABS
80.0000 mg | ORAL_TABLET | Freq: Every day | ORAL | Status: DC
  Administered 2020-10-31 – 2020-11-01 (×2): 80 mg via ORAL
  Filled 2020-10-31 (×2): qty 1

## 2020-10-31 NOTE — Progress Notes (Signed)
PROGRESS NOTE    STEWART GOSSMAN  [id number] DOB: 18-Jul-1956 DOA: 10/22/2020 PCP: Shirline Frees, MD   Brief Narrative:   ADAMARI GOULETTE a 65 y.o.femalewith medical history significant forchronic congestive heart failure with diastolic dysfunction, chronic kidney disease stage IV, diabetes mellitus hypertension morbid obesity who presented to the emergency department this morning with 8 to 9 days of cough fever malaise generalized weakness intermittent vomiting diarrhea and shortness of breath-started feeling worse with coughing not eating well fatigued and worsening shortness of breath,worsening swelling of her legs and feet. Has received2 doses of Pfizer vaccine last vaccineApril 2021 but has not received her booster dose. Patient stated that her son tested positive for COVID and she has been around him. EMS they found patient with a saturation of 76% on room air and initially placed on 6 L which increased oxygenation to 87 and then she had to have a nonrebreather oxygen which increased her oxygen to 96% currently she is on room air at 5 L/min in the emergency department.  1/25: Scr continues to improve. Good UOP today. Appreciate nephro assistance. D/c foley today. Follow AM labs. Will await final recs from nephro. Possible d/c to home tomorrow.   Assessment & Plan: Acute metabolic encephalopathy, multifactorial, resolving - secondary to uremia for renal failure and hypoxia from COVID 19  Acute hypoxic respiratory failure secondary to covid 19 pneumonia COVID 19 PNA - completedremdesivir 1/20; continuebarcitinib, per protocol - steroids d/c d/t hyperglycemia, mentation change and rapidly improving COVID - ambulation, proning, IS, FV - pt has 2 vaccines but no booster - more than 6 months since second vaccine. - now on RA - 1/25: on baricitinib; respiratory status is good  AKI on CKD4 - nephro onboard, has had iHD, watching for renal  recovery - renal US ok - per nephrology     - 1/25: Scr continues to improve; appreciate nephrology assistance; will await final recs  IDDM2 - steroids d/c'd as above given mental status changes - SSI, DM diet, glucose checks - stopped 70/30 insulin 1/22 given recurrent hypoglycemia despite decreasing sliding scale: due to very poor PO intake - 1/25: glucose is acceptable; follow  Chronic anemia of CKD Iron deficiency anemia - getting iron     - 1/25: Hgb is stable, no signs of bleed, follow  Generalized weakness - PT recs HHPT, TOC consulted  DVT prophylaxis: heparin Code Status: FULL Family Communication: None at bedside   Status is: Inpatient  Remains inpatient appropriate because:Inpatient level of care appropriate due to severity of illness   Dispo: The patient is from: Home              Anticipated d/c is to: Home              Anticipated d/c date is: 1 day              Patient currently is not medically stable to d/c.   Difficult to place patient No   Consultants:   Nephrology  Subjective: "When can it come out?"  Objective: Vitals:   10/30/20 2205 10/31/20 0519 10/31/20 0800 10/31/20 0844  BP: (!) 175/76 (!) 149/58  (!) 167/56  Pulse: 76 67  75  Resp: '19 20  17  '$ Temp: 97.8 F (36.6 C) (!) 97 F (36.1 C)  98.5 F (36.9 C)  TempSrc: Oral Oral    SpO2: 95% 95% 95% 90%  Weight:        Intake/Output Summary (Last 24 hours) at 10/31/2020  1408 Last data filed at 10/31/2020 0800 Gross per 24 hour  Intake 120 ml  Output 2198 ml  Net -2078 ml   Filed Weights   10/26/20 1620 10/27/20 0500 10/30/20 1400  Weight: 132.3 kg (!) 138.9 kg 126.7 kg    Examination:  General: 65 y.o. female resting in bed in NAD Eyes: PERRL, normal sclera ENMT: Nares patent w/o discharge, orophaynx clear, dentition normal, ears w/o discharge/lesions/ulcers Neck: Supple, trachea midline Cardiovascular: RRR, +S1, S2, no m/g/r, equal pulses  throughout Respiratory: CTABL, no w/r/r, normal WOB GI: BS+, NDNT, no masses noted, no organomegaly noted MSK: No e/c/c Skin: No rashes, bruises, ulcerations noted Neuro: A&O x 3, no focal deficits Psyc: Anxious but cooperative   Data Reviewed: I have personally reviewed following labs and imaging studies.  CBC: Recent Labs  Lab 10/27/20 0328 10/28/20 0205 10/29/20 0200 10/30/20 0414 10/31/20 0425  WBC 8.2 9.8 10.9* 11.7* 9.5  HGB 8.2* 8.9* 8.0* 7.6* 7.6*  HCT 25.7* 27.7* 24.2* 23.9* 23.5*  MCV 87.4 87.9 87.1 89.8 89.4  PLT 225 274 283 296 [id number]   Basic Metabolic Panel: Recent Labs  Lab 10/24/20 1729 10/25/20 0551 10/26/20 0805 10/27/20 0328 10/28/20 0205 10/29/20 0200 10/30/20 0414 10/31/20 0425  NA 133*   < > 138 140 141 141 142 139  K 5.8*   < > 5.2* 3.8 4.3 4.3 4.4 4.3  CL 104   < > 106 107 106 107 109 104  CO2 14*   < > 14* 20* 20* '22 22 24  '$ GLUCOSE 347*   < > 126* 55* 99 185* 149* 170*  BUN 134*   < > 161* 99* 105* 98* 84* 47*  CREATININE 7.30*   < > 8.22* 5.05* 4.72* 4.03* 3.53* 2.61*  CALCIUM 6.2*   < > 6.6* 6.8* 7.0* 7.2* 7.6* 7.8*  PHOS 8.8*  --  10.4*  --   --   --   --   --    < > = values in this interval not displayed.   GFR: CrCl cannot be calculated (Unknown ideal weight.). Liver Function Tests: Recent Labs  Lab 10/24/20 1729 10/26/20 0805  ALBUMIN 2.3* 2.6*   No results for input(s): LIPASE, AMYLASE in the last 168 hours. No results for input(s): AMMONIA in the last 168 hours. Coagulation Profile: No results for input(s): INR, PROTIME in the last 168 hours. Cardiac Enzymes: No results for input(s): CKTOTAL, CKMB, CKMBINDEX, TROPONINI in the last 168 hours. BNP (last 3 results) No results for input(s): PROBNP in the last 8760 hours. HbA1C: No results for input(s): HGBA1C in the last 72 hours. CBG: Recent Labs  Lab 10/30/20 1233 10/30/20 1619 10/30/20 2201 10/31/20 0839 10/31/20 1202  GLUCAP 158* 108* 166* 169* 169*   Lipid  Profile: No results for input(s): CHOL, HDL, LDLCALC, TRIG, CHOLHDL, LDLDIRECT in the last 72 hours. Thyroid Function Tests: No results for input(s): TSH, T4TOTAL, FREET4, T3FREE, THYROIDAB in the last 72 hours. Anemia Panel: No results for input(s): VITAMINB12, FOLATE, FERRITIN, TIBC, IRON, RETICCTPCT in the last 72 hours. Sepsis Labs: No results for input(s): PROCALCITON, LATICACIDVEN in the last 168 hours.  Recent Results (from the past 240 hour(s))  Blood Culture (routine x 2)     Status: None   Collection Time: 10/22/20  9:30 AM   Specimen: BLOOD  Result Value Ref Range Status   Specimen Description BLOOD SITE NOT SPECIFIED  Final   Special Requests   Final    BOTTLES DRAWN AEROBIC  AND ANAEROBIC Blood Culture adequate volume   Culture   Final    NO GROWTH 5 DAYS Performed at [facility], [place] [address], [place], [place] [address]    Report Status 10/27/2020 FINAL  Final  Blood Culture (routine x 2)     Status: None   Collection Time: 10/22/20 10:01 AM   Specimen: BLOOD RIGHT HAND  Result Value Ref Range Status   Specimen Description BLOOD RIGHT HAND  Final   Special Requests   Final    BOTTLES DRAWN AEROBIC AND ANAEROBIC Blood Culture results may not be optimal due to an inadequate volume of blood received in culture bottles   Culture   Final    NO GROWTH 5 DAYS Performed at [facility], [place] [address] [place], [place] [address]    Report Status 10/27/2020 FINAL  Final      Radiology Studies: DG Knee Right Port  Result Date: 10/30/2020 CLINICAL DATA:  Chronic right knee pain. EXAM: PORTABLE RIGHT KNEE - 1-2 VIEW COMPARISON:  None. FINDINGS: Moderate to marked medial joint space narrowing. Moderate medial, mild to moderate patellofemoral and minimal lateral spur formation. No effusion. IMPRESSION: Tricompartmental degenerative changes, most pronounced in the medial compartment. Electronically Signed   By: Claudie Revering M.D.   On: 10/30/2020 12:03      Scheduled Meds: . amLODipine  10 mg Oral Daily  . aspirin EC  81 mg Oral Daily  . calcium acetate  1,334 mg Oral TID WC  . carvedilol  25 mg Oral BID WC  . Chlorhexidine Gluconate Cloth  6 each Topical Daily  . Chlorhexidine Gluconate Cloth  6 each Topical Q0600  . cycloSPORINE  1 drop Both Eyes BID  . darbepoetin (ARANESP) injection - DIALYSIS  60 mcg Intravenous Q Sat-HD  . furosemide  80 mg Oral Daily  . heparin injection (subcutaneous)  7,500 Units Subcutaneous Q8H  . insulin aspart  0-5 Units Subcutaneous QHS  . insulin aspart  0-9 Units Subcutaneous TID WC   Continuous Infusions:   LOS: 9 days    Time spent: 25 minutes spent in the coordination of care today.   Jonnie Finner, DO [facility]  If 7PM-7AM, please contact night-coverage [email or web address] 10/31/2020, 2:08 PM

## 2020-10-31 NOTE — Plan of Care (Signed)

## 2020-10-31 NOTE — TOC Initial Note (Addendum)
Transition of Care [facility]) - Initial/Assessment Note    Patient Details  Name: Latasha [name] MRN: [id number] Date of Birth: 1955-11-18  [facility] CM/SW Contact:    Joanne Chars, LCSW Phone Number: 10/31/2020, 9:07 AM  Clinical Narrative:  CSW completed phone assessment with pt who is agreeable to recommendation for [facility] RN provided pt with choice document. Pt reports no preference in [facility] agency. Permission given to speak with husband Marcello Moores and daughter Elwin Sleight [phone])  Pt reports that her husband does work and is gone during the day but her daughter Elwin Sleight is available to be with her during the day and she will have 24/7 support at home.  Pt is vaccinated for covid.  PCP in place.  Current equipment in home: walker, shower chair, cane.  Pt declines recommendation for bedside commode.  CSW attempted to reach daughter Tamika by phone (voicemail full) and text.     1510: phone call from daughter Elwin Sleight.   Correct phone number is: [phone].  She confirmed that she will be available to be with her mother during the day and is planning to pick her up at discharge tomorrow.                 Expected Discharge Plan: [place] Barriers to Discharge: No Barriers Identified   Patient Goals and CMS Choice Patient states their goals for this hospitalization and ongoing recovery are:: "get back to being able to be with my grandchildren" CMS Medicare.gov Compare Post Acute Care list provided to:: Patient Choice offered to / list presented to : Patient  Expected Discharge Plan and Services Expected Discharge Plan: [place] In-house Referral: Clinical Social Work Discharge Planning Services: CM Consult Post Acute Care Choice: [place] arrangements for the past 2 months: Single Family Home                 DME Arranged: N/A DME Agency: NA                  Prior Living Arrangements/Services Living arrangements for  the past 2 months: Single Family Home Lives with:: Spouse Patient language and need for interpreter reviewed:: Yes Do you feel safe going back to the place where you live?: Yes      Need for Family Participation in Patient Care: Yes (Comment) Care giver support system in place?: Yes (comment) Current home services: Other (comment) (none) Criminal Activity/Legal Involvement Pertinent to Current Situation/Hospitalization: No - Comment as needed  Activities of Daily Living Home Assistive Devices/Equipment: Cane (specify quad or straight),Walker (specify type) ADL Screening (condition at time of admission) Patient's cognitive ability adequate to safely complete daily activities?: Yes Is the patient deaf or have difficulty hearing?: No Does the patient have difficulty seeing, even when wearing glasses/contacts?: Yes Does the patient have difficulty concentrating, remembering, or making decisions?: No Patient able to express need for assistance with ADLs?: Yes Does the patient have difficulty dressing or bathing?: No Independently performs ADLs?: No Communication: Independent Dressing (OT): Needs assistance Is this a change from baseline?: Change from baseline, expected to last <3days Grooming: Needs assistance Is this a change from baseline?: Change from baseline, expected to last >3 days Feeding: Independent Bathing: Needs assistance Is this a change from baseline?: Change from baseline, expected to last >3 days Toileting: Needs assistance Is this a change from baseline?: Change from baseline, expected to last >3days In/Out Bed: Needs assistance Is this a change from  baseline?: Change from baseline, expected to last >3 days Walks in Home: Needs assistance Is this a change from baseline?: Change from baseline, expected to last >3 days Does the patient have difficulty walking or climbing stairs?: Yes Weakness of Legs: Both Weakness of Arms/Hands: None  Permission  Sought/Granted Permission sought to share information with : Family Supports Permission granted to share information with : Yes, Verbal Permission Granted  Share Information with NAME: husband, Marcello Moores.  Daughter, Elwin Sleight  Permission granted to share info w AGENCY: HH        Emotional Assessment Appearance:: Other (Comment Required (phone assessment) Attitude/Demeanor/Rapport: Engaged Affect (typically observed): Appropriate,Pleasant Orientation: : Oriented to Self,Oriented to Place,Oriented to  Time,Oriented to Situation Alcohol / Substance Use: Not Applicable Psych Involvement: No (comment)  Admission diagnosis:  Renal failure [N19] Acute respiratory failure with hypoxia ([place]) [J96.01] AKI (acute kidney injury) ([place]) [N17.9] Pneumonia due to COVID-19 virus [U07.1, J12.82] Patient Active Problem List   Diagnosis Date Noted  . Pneumonia due to COVID-19 virus 10/22/2020  . Acute on chronic respiratory failure with hypoxia ([place]) 10/22/2020  . Cardiorenal syndrome 03/05/2020  . Chest pain, rule out acute myocardial infarction 03/07/2018  . CKD (chronic kidney disease) stage 4, GFR 15-29 ml/min (HCC) 03/07/2018  . Hyperphosphatemia 06/09/2017  . Elevated ALT measurement 06/08/2017  . Hypertensive heart disease with acute on chronic diastolic congestive heart failure ([place])   . Bilateral leg edema   . Chronic diastolic CHF (congestive heart failure) ([place]) 06/07/2017  . Acute on chronic diastolic CHF (congestive heart failure) ([place]) 06/07/2017  . Acute respiratory failure with hypoxia ([place]) 06/07/2017  . ARF (acute renal failure) ([place])   . Encephalopathy acute 05/15/2017  . Hyperosmolar non-ketotic state in patient with type 2 diabetes mellitus ([place]) 05/15/2017  . Chronic pain 05/15/2017  . Acute kidney injury superimposed on chronic kidney disease ([place])   . Hyperlipemia 01/19/2014  . Palpitations 01/19/2014  . PVC's (premature ventricular contractions) 01/19/2014  . SCIATICA, RIGHT  12/12/2010  . GASTROESOPHAGEAL REFLUX DISEASE 02/09/2010  . LEG CRAMPS 02/09/2010  . Morbid obesity ([place]) 12/01/2009  . VITAMIN D DEFICIENCY 10/13/2009  . CONSTIPATION 10/13/2009  . LIVER FUNCTION TESTS, ABNORMAL, HX OF 10/13/2009  . Essential hypertension 08/24/2009  . SHOULDER PAIN, RIGHT, CHRONIC 08/24/2009  . Insulin-requiring or dependent type II diabetes mellitus ([place]) 10/08/1991   PCP:  Shirline Frees, MD Pharmacy:   [facility] [address]), [place] [id number] [place] ([place]) [place] [address] Phone: [phone] Fax: [place], [place] [address] Phone: [phone] Fax: [phone]     Social Determinants of Health (SDOH) Interventions    Readmission Risk Interventions No flowsheet data found.

## 2020-10-31 NOTE — Progress Notes (Signed)
[facility] NEPHROLOGY PROGRESS NOTE  Assessment/ Plan: Pt is a 65 y.o. yo female with PMH of HTN,HLD,DM2,HFpEF, and CKD4 admitted for acute hypoxic respiratory failure 2/2 Covid-19 pneumonia, consulted for AKI on CKD 4.    #AKI on CKD stage IV: Presumed secondary to to COVID-pneumonia.  CKD from DM and hypertension and followed by Dr. Johnney Ou (myself), last visit on 12/3 with creatinine level 2.78 and BUN 101.  She had tunneled HD catheter placed by IR on 1/20 and started dialysis. Urine output 2.3 L and creatinine level trending down.  It seems like she is having renal recovery therefore will cont holding further dialysis.  Strict ins and out, daily weight and watch for renal recovery. D/C foley today and check PVR x 1.  Plan is to look at AM labs and if Cr </= 3.5 ok to d/c with close f/u with me.  Depending on labs I may elect to leave [facility] in place pending f/u.    #Acute hypoxic respiratory failure in the setting of COVID-pneumonia: Completed remdesivir and baricitinib.  Now is stable and in room air.  #Anemia of CKD: Iron saturation 13% therefore starting IV iron and ESA.  Monitor hemoglobin - currently mid 7s.  #CKD MBD: Phos was 10.4 = started PhosLo.  Check PTH.   #Hypertension/volume: BP acceptable continue current antihypertensive medication.  Some edema, expect increase urination as with renal recovery but add back home lasix 80 daily today as well   Subjective: Seen and examined at bedside.  Reports wanting to go home. Urine output 2.3 L.  Rec'd 1 hr of HD yesterday due to orders transferring over from missed HD Sunday - as soon as we realized we stopped the treatment.  No nausea, vomiting, dysgeusia, chest pain or shortness of breath.  Objective Vital signs in last 24 hours: Vitals:   10/30/20 1700 10/30/20 2205 10/31/20 0519 10/31/20 0844  BP:  (!) 175/76 (!) 149/58 (!) 167/56  Pulse:  76 67 75  Resp:  '19 20 17  '$ Temp:  97.8 F (36.6 C) (!) 97 F (36.1 C)  98.5 F (36.9 C)  TempSrc:  Oral Oral   SpO2: 90% 95% 95% 90%  Weight:       Weight change:   Intake/Output Summary (Last 24 hours) at 10/31/2020 1104 Last data filed at 10/31/2020 0500 Gross per 24 hour  Intake 645.46 ml  Output 2598 ml  Net -1952.54 ml       Labs: Basic Metabolic Panel: Recent Labs  Lab 10/24/20 1729 10/25/20 0551 10/26/20 0805 10/27/20 0328 10/29/20 0200 10/30/20 0414 10/31/20 0425  NA 133*   < > 138   < > 141 142 139  K 5.8*   < > 5.2*   < > 4.3 4.4 4.3  CL 104   < > 106   < > 107 109 104  CO2 14*   < > 14*   < > '22 22 24  '$ GLUCOSE [id number]*   < > 126*   < > 185* 149* 170*  BUN 134*   < > 161*   < > 98* 84* 47*  CREATININE 7.30*   < > 8.22*   < > 4.03* 3.53* 2.61*  CALCIUM 6.2*   < > 6.6*   < > 7.2* 7.6* 7.8*  PHOS 8.8*  --  10.4*  --   --   --   --    < > = values in this interval not displayed.   Liver Function Tests:  Recent Labs  Lab 10/24/20 1729 10/26/20 0805  ALBUMIN 2.3* 2.6*   No results for input(s): LIPASE, AMYLASE in the last 168 hours. No results for input(s): AMMONIA in the last 168 hours. CBC: Recent Labs  Lab 10/27/20 0328 10/28/20 0205 10/29/20 0200 10/30/20 0414 10/31/20 0425  WBC 8.2 9.8 10.9* 11.7* 9.5  HGB 8.2* 8.9* 8.0* 7.6* 7.6*  HCT 25.7* 27.7* 24.2* 23.9* 23.5*  MCV 87.4 87.9 87.1 89.8 89.4  PLT 225 274 283 296 286   Cardiac Enzymes: Recent Labs  Lab 10/24/20 1338  CKTOTAL 1,198*   CBG: Recent Labs  Lab 10/30/20 0834 10/30/20 1233 10/30/20 1619 10/30/20 2201 10/31/20 0839  GLUCAP 181* 158* 108* 166* 169*    Iron Studies:  No results for input(s): IRON, TIBC, TRANSFERRIN, FERRITIN in the last 72 hours. Studies/Results: DG Knee Right Port  Result Date: 10/30/2020 CLINICAL DATA:  Chronic right knee pain. EXAM: PORTABLE RIGHT KNEE - 1-2 VIEW COMPARISON:  None. FINDINGS: Moderate to marked medial joint space narrowing. Moderate medial, mild to moderate patellofemoral and minimal lateral spur  formation. No effusion. IMPRESSION: Tricompartmental degenerative changes, most pronounced in the medial compartment. Electronically Signed   By: Claudie Revering M.D.   On: 10/30/2020 12:03    Medications: Infusions:   Scheduled Medications: . amLODipine  10 mg Oral Daily  . aspirin EC  81 mg Oral Daily  . calcium acetate  1,334 mg Oral TID WC  . carvedilol  25 mg Oral BID WC  . Chlorhexidine Gluconate Cloth  6 each Topical Daily  . Chlorhexidine Gluconate Cloth  6 each Topical Q0600  . cycloSPORINE  1 drop Both Eyes BID  . darbepoetin (ARANESP) injection - DIALYSIS  60 mcg Intravenous Q Sat-HD  . furosemide  80 mg Oral Daily  . heparin injection (subcutaneous)  7,500 Units Subcutaneous Q8H  . insulin aspart  0-5 Units Subcutaneous QHS  . insulin aspart  0-9 Units Subcutaneous TID WC    have reviewed scheduled and prn medications.  Physical Exam: General:NAD, comfortable Heart:RRR, s1s2 nl Lungs:clear b/l, no crackle Abdomen:soft, Non-tender, non-distended Extremities: Lower extremity edema present - 1+ to thigh Dialysis Access: Right IJ TDC c/d/i  Justin Mend 10/31/2020,11:04 AM  LOS: 9 days

## 2020-11-01 ENCOUNTER — Other Ambulatory Visit (HOSPITAL_COMMUNITY): Payer: Self-pay | Admitting: Internal Medicine

## 2020-11-01 DIAGNOSIS — U071 COVID-19: Secondary | ICD-10-CM | POA: Diagnosis not present

## 2020-11-01 DIAGNOSIS — N179 Acute kidney failure, unspecified: Secondary | ICD-10-CM

## 2020-11-01 DIAGNOSIS — R600 Localized edema: Secondary | ICD-10-CM | POA: Diagnosis not present

## 2020-11-01 DIAGNOSIS — J9601 Acute respiratory failure with hypoxia: Secondary | ICD-10-CM

## 2020-11-01 LAB — CBC
HCT: 23.4 % — ABNORMAL LOW (ref 36.0–46.0)
Hemoglobin: 7.1 g/dL — ABNORMAL LOW (ref 12.0–15.0)
MCH: 28.1 pg (ref 26.0–34.0)
MCHC: 30.3 g/dL (ref 30.0–36.0)
MCV: 92.5 fL (ref 80.0–100.0)
Platelets: 264 10*3/uL (ref 150–400)
RBC: 2.53 MIL/uL — ABNORMAL LOW (ref 3.87–5.11)
RDW: 14.4 % (ref 11.5–15.5)
WBC: 9.4 10*3/uL (ref 4.0–10.5)
nRBC: 0 % (ref 0.0–0.2)

## 2020-11-01 LAB — BASIC METABOLIC PANEL
Anion gap: 8 (ref 5–15)
BUN: 42 mg/dL — ABNORMAL HIGH (ref 8–23)
CO2: 26 mmol/L (ref 22–32)
Calcium: 7.8 mg/dL — ABNORMAL LOW (ref 8.9–10.3)
Chloride: 106 mmol/L (ref 98–111)
Creatinine, Ser: 2.82 mg/dL — ABNORMAL HIGH (ref 0.44–1.00)
GFR, Estimated: 18 mL/min — ABNORMAL LOW (ref >60)
Glucose, Bld: 190 mg/dL — ABNORMAL HIGH (ref 70–99)
Potassium: 4.1 mmol/L (ref 3.5–5.1)
Sodium: 140 mmol/L (ref 135–145)

## 2020-11-01 LAB — GLUCOSE, CAPILLARY
Glucose-Capillary: 168 mg/dL — ABNORMAL HIGH (ref 70–99)
Glucose-Capillary: 169 mg/dL — ABNORMAL HIGH (ref 70–99)

## 2020-11-01 LAB — PARATHYROID HORMONE, INTACT (NO CA): PTH: 111 pg/mL — ABNORMAL HIGH (ref 15–65)

## 2020-11-01 MED ORDER — FUROSEMIDE 80 MG PO TABS
80.0000 mg | ORAL_TABLET | Freq: Every day | ORAL | 0 refills | Status: DC
Start: 2020-11-02 — End: 2020-11-02

## 2020-11-01 NOTE — Care Management Important Message (Signed)
Important Message  Patient Details  Name: Latasha [name] MRN: [name] Date of Birth: Mar 22, 1956   Medicare Important Message Given:  Yes - Important Message mailed due to current National Emergency  Verbal consent obtained due to current National Emergency  Relationship to patient: Self Contact Name: Azelle Balbach Call Date: 11/01/20  Time: 1421 Phone: [phone] Outcome: No Answer/Busy Important Message mailed to: Patient address on file    Delorse Lek 11/01/2020, 2:22 PM

## 2020-11-01 NOTE — Plan of Care (Signed)
  Problem: Education: Goal: Knowledge of risk factors and measures for prevention of condition will improve Outcome: Adequate for Discharge   

## 2020-11-01 NOTE — TOC Transition Note (Signed)
Transition of Care [facility]) - CM/SW Discharge Note   Patient Details  Name: Latasha [name] MRN: [id number] Date of Birth: 1956-05-18  [facility] CM/SW Contact:  Joanne Chars, LCSW Phone Number: 11/01/2020, 2:26 PM   Clinical Narrative:   Pt discharging home with [facility].  No equipment needs.  Daughter will transport home.  No other needs identified.     Final next level of care: [place] Barriers to Discharge: No Barriers Identified   Patient Goals and CMS Choice Patient states their goals for this hospitalization and ongoing recovery are:: "get back to being able to be with my grandchildren" CMS Medicare.gov Compare Post Acute Care list provided to:: Patient Choice offered to / list presented to : Patient  Discharge Placement                       Discharge Plan and Services In-house Referral: Clinical Social Work Discharge Planning Services: CM Consult Post Acute Care Choice: Home Health          DME Arranged: N/A DME Agency: NA       HH Arranged: PT,OT [place] Agency: [facility] Date [place] Agency Contacted: 10/31/20 Time [place]: [id number] Representative spoke with at [place]: [place]  Social Determinants of Health ([place]) Interventions     Readmission Risk Interventions No flowsheet data found.

## 2020-11-01 NOTE — Discharge Summary (Signed)
Physician Discharge Summary  Latasha [name] [id number] DOB: [birth year] DOA: 10/22/2020  PCP: Shirline Frees, MD  Admit date: 10/22/2020 Discharge date: 11/01/2020  Admitted From: Home Disposition:  Home  Recommendations for Outpatient Follow-up:  1. Follow up with PCP in 1-2 weeks 2. Follow up with [facility] on [place] on Monday for catheter care, at 11 am  Discharge Condition:Improved CODE STATUS:Full Diet recommendation: Renal, diabetic   Brief/Interim Summary: 65 y.o.femalewith medical history significant forchronic congestive heart failure with diastolic dysfunction, chronic kidney disease stage IV, diabetes mellitus hypertension morbid obesity who presented to the emergency department this morning with 8 to 9 days of cough fever malaise generalized weakness intermittent vomiting diarrhea and shortness of breath-started feeling worse with coughing not eating well fatigued and worsening shortness of breath,worsening swelling of her legs and feet. Has received2 doses of Pfizer vaccine last vaccineApril 2021 but has not received her booster dose. Patient stated that her son tested positive for COVID and she has been around him. EMS they found patient with a saturation of 76% on room air and initially placed on 6 L which increased oxygenation to 87 and then she had to have a nonrebreather oxygen which increased her oxygen to 96% currently she is on room air at 5 L/min in the emergency department.  Discharge Diagnoses:  Principal Problem:   Pneumonia due to COVID-19 virus Active Problems:   Insulin-requiring or dependent type II diabetes mellitus ([place])   Morbid obesity ([place])   Hyperlipemia   Bilateral leg edema   CKD (chronic kidney disease) stage 4, GFR 15-29 ml/min (HCC)   Acute on chronic respiratory failure with hypoxia (HCC)  Acute metabolic encephalopathy, multifactorial, resolving - secondary to uremia for renal failure and hypoxia from COVID 19  Acute hypoxic  respiratory failure secondary to covid 19 pneumonia COVID 19 PNA - completedremdesivir 1/20; continuebarcitinib, per protocol - steroids d/c d/t hyperglycemia, mentation change and rapidly improving COVID - ambulation, proning, IS, FV - pt has 2 vaccines but no booster - more than 6 months since second vaccine. - now on RA -1/25:on baricitinib; respiratory status stable  AKI on CKD4 - nephro onboard, has had iHD, watching for renal recovery - renal US ok - per nephrology - 1/25: Scr continues to improve; discussed with Nephrology. OK to d/c today with close outpt follow up  IDDM2 - steroids d/c'd as above given mental status changes - SSI, DM diet, glucose checks - stopped 70/30 insulin 1/22 given recurrent hypoglycemia despite decreasing sliding scale: due to very poor PO intake -1/25: glucose is acceptable; follow  Chronic anemia of CKD Iron deficiency anemia - getting iron - 1/25: Hgb is stable, no signs of bleed, follow  Generalized weakness - PT recs HHPT, TOC consulted  Discharge Instructions   Allergies as of 11/01/2020      Reactions   Hydrocodone-acetaminophen Other (See Comments)   Unknown reaction   Oxycodone Hcl Other (See Comments)   Unknown reaction      Medication List    STOP taking these medications   gabapentin 100 MG capsule Commonly known as: NEURONTIN   lisinopril 5 MG tablet Commonly known as: ZESTRIL   potassium chloride SA 20 MEQ tablet Commonly known as: KLOR-CON     TAKE these medications   acetaminophen 500 MG tablet Commonly known as: TYLENOL Take 500 mg by mouth every 6 (six) hours as needed for mild pain, fever or headache.   albuterol 108 (90 Base) MCG/ACT inhaler Commonly known as: VENTOLIN  HFA Inhale 2 puffs into the lungs every 6 (six) hours as needed for wheezing or shortness of breath.   amLODipine 10 MG tablet Commonly known as: NORVASC Take  1 tablet (10 mg total) by mouth daily.   atorvastatin 40 MG tablet Commonly known as: LIPITOR Take 40 mg by mouth daily.   carvedilol 25 MG tablet Commonly known as: COREG Take 1 tablet (25 mg total) by mouth 2 (two) times daily with a meal.   furosemide 80 MG tablet Commonly known as: LASIX Take 1 tablet (80 mg total) by mouth daily. Start taking on: November 02, 2020   insulin aspart protamine- aspart (70-30) 100 UNIT/ML injection Commonly known as: NOVOLOG MIX 70/30 Inject 50-60 Units into the skin See admin instructions. Use 60 units every morning and use 50 units every evening   nitroGLYCERIN 0.4 MG SL tablet Commonly known as: NITROSTAT Place 1 tablet (0.4 mg total) under the tongue every 5 (five) minutes as needed for chest pain.       [place] Follow up.   Specialty: Home Health Services Why: [facility] home health will call you to schedule and complete your first appointment within 24 hours. Contact information: [place] [address] [phone]        Justin Mend, MD Follow up on 11/06/2020.   Specialty: Internal Medicine Why: as scheduled at [facility] information: [place] [phone]              Allergies  Allergen Reactions  . Hydrocodone-Acetaminophen Other (See Comments)    Unknown reaction  . Oxycodone Hcl Other (See Comments)    Unknown reaction    Consultations:  nephrology  Procedures/Studies: DG Chest 2 View  Result Date: 10/09/2020 CLINICAL DATA:  Short of breath since 10/06/2020, wheezing EXAM: CHEST - 2 VIEW COMPARISON:  03/05/2020 FINDINGS: Frontal and lateral views of the chest demonstrates stable enlargement of the cardiac silhouette. There is stable central vascular prominence without airspace disease, effusion, or pneumothorax. No acute bony abnormalities. IMPRESSION: 1. Chronic central vascular prominence.  No acute  airspace disease. Electronically Signed   By: Randa Ngo M.D.   On: 10/09/2020 22:33   US RENAL  Result Date: 10/24/2020 CLINICAL DATA:  Renal failure EXAM: RENAL / URINARY TRACT ULTRASOUND COMPLETE COMPARISON:  Renal stone CT 05/01/2017 Ultrasound kidneys 05/16/2017 FINDINGS: Right Kidney: Renal measurements: 12.2 x 4.8 x 5.2 cm = volume: 159 mL. Echogenicity within normal limits. No mass or hydronephrosis visualized. Left Kidney: Renal measurements: 12.9 x 5.9 x 5.9 cm = volume: 229 mL. Echogenicity within normal limits. No mass or hydronephrosis visualized. Visualization of the left kidney is limited. Bladder: Not visualized.  Decompressed. Other: None. IMPRESSION: No acute abnormality of the kidneys. Electronically Signed   By: Miachel Roux M.D.   On: 10/24/2020 07:45   IR Fluoro Guide CV Line Right  Result Date: 10/26/2020 INDICATION: 65 year old with end-stage renal disease and COVID-19. Patient needs access for hemodialysis. EXAM: FLUOROSCOPIC AND ULTRASOUND GUIDED PLACEMENT OF A TUNNELED DIALYSIS CATHETER Physician: Stephan Minister. Anselm Pancoast, MD MEDICATIONS: Ancef 2 g; The antibiotic was administered within an appropriate time interval prior to skin puncture. ANESTHESIA/SEDATION: Versed 1.0 mg IV; Fentanyl 50 mcg IV; Moderate Sedation Time:  32 minutes The patient was continuously monitored during the procedure by the interventional radiology nurse under my direct supervision. FLUOROSCOPY TIME:  Fluoroscopy Time: 24 seconds, 14 mGy COMPLICATIONS: None  immediate. PROCEDURE: The procedure was explained to the patient. The risks and benefits of the procedure were discussed and the patient's questions were addressed. Informed consent was obtained from the patient. The patient was placed supine on the interventional table. Ultrasound confirmed a patent right internal jugular vein. Ultrasound images were obtained for documentation. The right neck and chest was prepped and draped in a sterile fashion. The right neck  was anesthetized with 1% lidocaine. Maximal barrier sterile technique was utilized including caps, mask, sterile gowns, sterile gloves, sterile drape, hand hygiene and skin antiseptic. A small incision was made with #11 blade scalpel. A 21 gauge needle directed into the right internal jugular vein with ultrasound guidance. A micropuncture dilator set was placed. A 23 cm tip to cuff Palindrome catheter was selected. The skin below the right clavicle was anesthetized and a small incision was made with an #11 blade scalpel. A subcutaneous tunnel was formed to the vein dermatotomy site. The catheter was brought through the tunnel. The vein dermatotomy site was dilated to accommodate a peel-away sheath. The catheter was placed through the peel-away sheath and directed into the central venous structures. The tip of the catheter was placed in the upper right atrium with fluoroscopy. Fluoroscopic images were obtained for documentation. Both lumens were found to aspirate and flush well. The proper amount of heparin was flushed in both lumens. The vein dermatotomy site was closed using a single layer of absorbable suture and Dermabond. Gel-Foam was placed in subcutaneous tract. The catheter was secured to the skin using Prolene suture. IMPRESSION: Successful placement of a right jugular tunneled dialysis catheter using ultrasound and fluoroscopic guidance. Electronically Signed   By: Markus Daft M.D.   On: 10/26/2020 10:41   IR US Guide Vasc Access Right  Result Date: 10/26/2020 INDICATION: 65 year old with end-stage renal disease and COVID-19. Patient needs access for hemodialysis. EXAM: FLUOROSCOPIC AND ULTRASOUND GUIDED PLACEMENT OF A TUNNELED DIALYSIS CATHETER Physician: Stephan Minister. Anselm Pancoast, MD MEDICATIONS: Ancef 2 g; The antibiotic was administered within an appropriate time interval prior to skin puncture. ANESTHESIA/SEDATION: Versed 1.0 mg IV; Fentanyl 50 mcg IV; Moderate Sedation Time:  32 minutes The patient was  continuously monitored during the procedure by the interventional radiology nurse under my direct supervision. FLUOROSCOPY TIME:  Fluoroscopy Time: 24 seconds, 14 mGy COMPLICATIONS: None immediate. PROCEDURE: The procedure was explained to the patient. The risks and benefits of the procedure were discussed and the patient's questions were addressed. Informed consent was obtained from the patient. The patient was placed supine on the interventional table. Ultrasound confirmed a patent right internal jugular vein. Ultrasound images were obtained for documentation. The right neck and chest was prepped and draped in a sterile fashion. The right neck was anesthetized with 1% lidocaine. Maximal barrier sterile technique was utilized including caps, mask, sterile gowns, sterile gloves, sterile drape, hand hygiene and skin antiseptic. A small incision was made with #11 blade scalpel. A 21 gauge needle directed into the right internal jugular vein with ultrasound guidance. A micropuncture dilator set was placed. A 23 cm tip to cuff Palindrome catheter was selected. The skin below the right clavicle was anesthetized and a small incision was made with an #11 blade scalpel. A subcutaneous tunnel was formed to the vein dermatotomy site. The catheter was brought through the tunnel. The vein dermatotomy site was dilated to accommodate a peel-away sheath. The catheter was placed through the peel-away sheath and directed into the central venous structures. The tip of the catheter was  placed in the upper right atrium with fluoroscopy. Fluoroscopic images were obtained for documentation. Both lumens were found to aspirate and flush well. The proper amount of heparin was flushed in both lumens. The vein dermatotomy site was closed using a single layer of absorbable suture and Dermabond. Gel-Foam was placed in subcutaneous tract. The catheter was secured to the skin using Prolene suture. IMPRESSION: Successful placement of a right  jugular tunneled dialysis catheter using ultrasound and fluoroscopic guidance. Electronically Signed   By: Markus Daft M.D.   On: 10/26/2020 10:41   DG Chest Port 1 View  Result Date: 10/22/2020 CLINICAL DATA:  Shortness of breath/hypoxia. COVID-19 positive exposure. EXAM: PORTABLE CHEST 1 VIEW COMPARISON:  10/09/2020 FINDINGS: Lungs are adequately inflated with patchy bilateral airspace opacification likely multifocal infection and may be viral in origin. No effusion. Borderline stable cardiomegaly. Remainder of the exam is unchanged. IMPRESSION: Patchy bilateral airspace process likely multifocal pneumonia which may be viral in origin. Electronically Signed   By: Marin Olp M.D.   On: 10/22/2020 09:44   DG Knee Right Port  Result Date: 10/30/2020 CLINICAL DATA:  Chronic right knee pain. EXAM: PORTABLE RIGHT KNEE - 1-2 VIEW COMPARISON:  None. FINDINGS: Moderate to marked medial joint space narrowing. Moderate medial, mild to moderate patellofemoral and minimal lateral spur formation. No effusion. IMPRESSION: Tricompartmental degenerative changes, most pronounced in the medial compartment. Electronically Signed   By: Claudie Revering M.D.   On: 10/30/2020 12:03     Subjective: Eager to go home today  Discharge Exam: Vitals:   10/31/20 2200 11/01/20 0541  BP: (!) 146/63 (!) 155/50  Pulse: 70 77  Resp: 18 19  Temp: 99.2 F (37.3 C) 98.5 F (36.9 C)  SpO2: 92% 90%   Vitals:   10/31/20 1834 10/31/20 2200 11/01/20 0500 11/01/20 0541  BP: (!) 157/57 (!) 146/63  (!) 155/50  Pulse: 75 70  77  Resp: '17 18  19  '$ Temp: 98.5 F (36.9 C) 99.2 F (37.3 C)  98.5 F (36.9 C)  TempSrc: Oral     SpO2:  92%  90%  Weight: 126.7 kg  125.4 kg   Height: '5\' 3"'$  (1.6 m)       General: Pt is alert, awake, not in acute distress Cardiovascular: RRR, S1/S2 +, no rubs, no gallops Respiratory: CTA bilaterally, no wheezing, no rhonchi Abdominal: Soft, NT, ND, bowel sounds + Extremities: no edema, no  cyanosis   The results of significant diagnostics from this hospitalization (including imaging, microbiology, ancillary and laboratory) are listed below for reference.     Microbiology: No results found for this or any previous visit (from the past 240 hour(s)).   Labs: BNP (last 3 results) Recent Labs    03/05/20 0416  BNP [id number]   Basic Metabolic Panel: Recent Labs  Lab 10/26/20 0805 10/27/20 0328 10/28/20 0205 10/29/20 0200 10/30/20 0414 10/31/20 0425 11/01/20 0338  NA 138   < > 141 141 142 139 140  K 5.2*   < > 4.3 4.3 4.4 4.3 4.1  CL 106   < > 106 107 109 104 106  CO2 14*   < > 20* '22 22 24 26  '$ GLUCOSE 126*   < > 99 185* 149* 170* 190*  BUN 161*   < > 105* 98* 84* 47* 42*  CREATININE 8.22*   < > 4.72* 4.03* 3.53* 2.61* 2.82*  CALCIUM 6.6*   < > 7.0* 7.2* 7.6* 7.8* 7.8*  PHOS 10.4*  --   --   --   --   --   --    < > =  values in this interval not displayed.   Liver Function Tests: Recent Labs  Lab 10/26/20 0805  ALBUMIN 2.6*   No results for input(s): LIPASE, AMYLASE in the last 168 hours. No results for input(s): AMMONIA in the last 168 hours. CBC: Recent Labs  Lab 10/28/20 0205 10/29/20 0200 10/30/20 0414 10/31/20 0425 11/01/20 0338  WBC 9.8 10.9* 11.7* 9.5 9.4  HGB 8.9* 8.0* 7.6* 7.6* 7.1*  HCT 27.7* 24.2* 23.9* 23.5* 23.4*  MCV 87.9 87.1 89.8 89.4 92.5  PLT 274 283 296 286 264   Cardiac Enzymes: No results for input(s): CKTOTAL, CKMB, CKMBINDEX, TROPONINI in the last 168 hours. BNP: Invalid input(s): POCBNP CBG: Recent Labs  Lab 10/31/20 0839 10/31/20 1202 10/31/20 1623 10/31/20 2206 11/01/20 0825  GLUCAP 169* 169* 170* 182* 168*   D-Dimer Recent Labs    10/30/20 0414  DDIMER 3.81*   Hgb A1c No results for input(s): HGBA1C in the last 72 hours. Lipid Profile No results for input(s): CHOL, HDL, LDLCALC, TRIG, CHOLHDL, LDLDIRECT in the last 72 hours. Thyroid function studies No results for input(s): TSH, T4TOTAL, T3FREE,  THYROIDAB in the last 72 hours.  Invalid input(s): FREET3 Anemia work up No results for input(s): VITAMINB12, FOLATE, FERRITIN, TIBC, IRON, RETICCTPCT in the last 72 hours. Urinalysis    Component Value Date/Time   COLORURINE YELLOW 10/22/2020 1007   APPEARANCEUR CLOUDY (A) 10/22/2020 1007   LABSPEC 1.012 10/22/2020 1007   PHURINE 5.0 10/22/2020 1007   GLUCOSEU NEGATIVE 10/22/2020 1007   HGBUR SMALL (A) 10/22/2020 1007   BILIRUBINUR NEGATIVE 10/22/2020 1007   KETONESUR NEGATIVE 10/22/2020 1007   PROTEINUR 100 (A) 10/22/2020 1007   UROBILINOGEN 0.2 02/14/2012 1639   NITRITE NEGATIVE 10/22/2020 1007   LEUKOCYTESUR NEGATIVE 10/22/2020 1007   Sepsis Labs Invalid input(s): PROCALCITONIN,  WBC,  LACTICIDVEN Microbiology No results found for this or any previous visit (from the past 240 hour(s)).  Time spent: 52mn  SIGNED:   SMarylu Lund MD  [facility] 11/01/2020, 2:14 PM  If 7PM-7AM, please contact night-coverage

## 2020-11-01 NOTE — Progress Notes (Signed)
[facility] NEPHROLOGY PROGRESS NOTE  Assessment/ Plan: Pt is a 65 y.o. yo female with PMH of HTN,HLD,DM2,HFpEF, and CKD4 admitted for acute hypoxic respiratory failure 2/2 Covid-19 pneumonia, consulted for AKI on CKD 4.    #AKI on CKD stage IV: Presumed secondary to to COVID-pneumonia.  CKD from DM and hypertension and followed by Dr. Johnney Ou (myself), last visit on 12/3 with creatinine level 2.78 and BUN 101.  She had tunneled HD catheter placed by IR on 1/20 and started dialysis. Urine output adequate and creatinine level trending down.  It seems like she is having renal recovery therefore will cont holding further dialysis.   D/C foley today and check PVR x 1.  Plan is ok for d/c today with TDC in place.  I will see her in office next week with labs drawn day prior.  She will go to [facility] [address] on Monday for catheter care.    #Acute hypoxic respiratory failure in the setting of COVID-pneumonia: Completed remdesivir and baricitinib.  Now is stable and in room air.  #Anemia of CKD: Iron saturation 13% therefore rec'd IV iron and ESA.  Monitor hemoglobin - currently mid 7s.  Will need outpt ESA referral most likley; I will take care of.   #CKD MBD: Phos was 10.4 = started PhosLo.  Cont at discharge.   #Hypertension/volume: BP acceptable continue current antihypertensive medication.  Some edema, expect increase urination as with renal recovery and added back home torsemide 80 daily yesterday   Subjective: Seen and examined at bedside.  Reports wanting to go home. Urine output documented 611m but in complete documentation per her report.    Objective Vital signs in last 24 hours: Vitals:   10/31/20 1834 10/31/20 2200 11/01/20 0500 11/01/20 0541  BP: (!) 157/57 (!) 146/63  (!) 155/50  Pulse: 75 70  77  Resp: '17 18  19  '$ Temp: 98.5 F (36.9 C) 99.2 F (37.3 C)  98.5 F (36.9 C)  TempSrc: Oral     SpO2:  92%  90%  Weight: 126.7 kg  125.4 kg   Height: '5\' 3"'$  (1.6  m)      Weight change: 0 kg  Intake/Output Summary (Last 24 hours) at 11/01/2020 0701 Last data filed at 10/31/2020 0800 Gross per 24 hour  Intake --  Output 600 ml  Net -600 ml       Labs: Basic Metabolic Panel: Recent Labs  Lab 10/26/20 0805 10/27/20 0328 10/30/20 0414 10/31/20 0425 11/01/20 0338  NA 138   < > 142 139 140  K 5.2*   < > 4.4 4.3 4.1  CL 106   < > 109 104 106  CO2 14*   < > '22 24 26  '$ GLUCOSE 126*   < > 149* 170* 190*  BUN 161*   < > 84* 47* 42*  CREATININE 8.22*   < > 3.53* 2.61* 2.82*  CALCIUM 6.6*   < > 7.6* 7.8* 7.8*  PHOS 10.4*  --   --   --   --    < > = values in this interval not displayed.   Liver Function Tests: Recent Labs  Lab 10/26/20 0805  ALBUMIN 2.6*   No results for input(s): LIPASE, AMYLASE in the last 168 hours. No results for input(s): AMMONIA in the last 168 hours. CBC: Recent Labs  Lab 10/28/20 0205 10/29/20 0200 10/30/20 0414 10/31/20 0425 11/01/20 0338  WBC 9.8 10.9* 11.7* 9.5 9.4  HGB 8.9* 8.0* 7.6*  7.6* 7.1*  HCT 27.7* 24.2* 23.9* 23.5* 23.4*  MCV 87.9 87.1 89.8 89.4 92.5  PLT 274 283 296 286 264   Cardiac Enzymes: No results for input(s): CKTOTAL, CKMB, CKMBINDEX, TROPONINI in the last 168 hours. CBG: Recent Labs  Lab 10/30/20 2201 10/31/20 0839 10/31/20 1202 10/31/20 1623 10/31/20 2206  GLUCAP 166* 169* 169* 170* 182*    Iron Studies:  No results for input(s): IRON, TIBC, TRANSFERRIN, FERRITIN in the last 72 hours. Studies/Results: DG Knee Right Port  Result Date: 10/30/2020 CLINICAL DATA:  Chronic right knee pain. EXAM: PORTABLE RIGHT KNEE - 1-2 VIEW COMPARISON:  None. FINDINGS: Moderate to marked medial joint space narrowing. Moderate medial, mild to moderate patellofemoral and minimal lateral spur formation. No effusion. IMPRESSION: Tricompartmental degenerative changes, most pronounced in the medial compartment. Electronically Signed   By: Claudie Revering M.D.   On: 10/30/2020 12:03     Medications: Infusions:   Scheduled Medications: . amLODipine  10 mg Oral Daily  . aspirin EC  81 mg Oral Daily  . calcium acetate  1,334 mg Oral TID WC  . carvedilol  25 mg Oral BID WC  . Chlorhexidine Gluconate Cloth  6 each Topical Daily  . Chlorhexidine Gluconate Cloth  6 each Topical Q0600  . cycloSPORINE  1 drop Both Eyes BID  . darbepoetin (ARANESP) injection - DIALYSIS  60 mcg Intravenous Q Sat-HD  . furosemide  80 mg Oral Daily  . heparin injection (subcutaneous)  7,500 Units Subcutaneous Q8H  . insulin aspart  0-5 Units Subcutaneous QHS  . insulin aspart  0-9 Units Subcutaneous TID WC    have reviewed scheduled and prn medications.  Physical Exam: General:NAD, comfortable Heart:RRR, s1s2 nl Lungs:clear b/l, no crackle Abdomen:soft, Non-tender, non-distended Extremities: Lower extremity edema present - 1+ to thigh Dialysis Access: Right IJ TDC c/d/i  Justin Mend 11/01/2020,7:01 AM  LOS: 10 days

## 2020-11-01 NOTE — Consult Note (Signed)
   [name] Inpatient Consult   11/01/2020  Latasha [name] October 27, 1955 [name]   [facility] [ACO] Patient: [name] HMO   Patient screened for length of stay hospitalization to check for potential [place] Management service for post hospital follow up needs.  Review of patient's medical record reveals patient was recommended for a skilled nursing facility stay however, declines and wants to transition home with daughter as caregiver, will have new dialysis needs.  Primary Care Provider is Shirline Frees, MD, [name] Physician this provider is listed to provide the transition of care [TOC] for post hospital follow up.  14:45 Spoke with the patient via hospital phone regarding post hospital follow up for complex disease care management.  Explained [facility] CM role in community follow up with telephonic RN.  She states it's best to leave a voice mail message if no answer to home phone and gives permission to speak with her daughter Latasha [name].  Plan:  Assigned to [facility] RN Care Management Coordinator for complex disease management needs.   For questions contact:   Natividad Brood, RN BSN [facility] Liaison  [phone] business mobile phone Toll free office [phone]  Fax number: [phone] [name]

## 2020-11-01 NOTE — Progress Notes (Signed)
Discharge instructions (including medications) discussed with and copy provided to patient/caregiver 

## 2020-11-01 NOTE — Progress Notes (Signed)
Occupational Therapy Treatment Patient Details Name: Latasha [name] MRN: [id number] DOB: May 14, 1956 Today's Date: 11/01/2020    History of present illness 65 y.o. female admitted on 10/22/20 forSOB, fatigue/weakness, increase swelling in bil LEs.  Pt dx with acute hypoxic respiratory failure due to COVID 19 PNA.  AKI on CKD stage IV now on iHD s/p vascular access R chest on 10/26/20, hyperkalemia.  Pt with significant PMH of morbid obesity, HTN, DM, CHF, BPPV, knee surgery.   OT comments  Pt progressing towards acute OT goals. Focus of session was strategies for conserving energy and building activity tolerance during ADLs. Also discussed AE and technique for LB dressing. Pt awaiting d/c home today. D/c plan remains appropriate.    Follow Up Recommendations  Home health OT;Supervision/Assistance - 24 hour    Equipment Recommendations  3 in 1 bedside commode    Recommendations for Other Services      Precautions / Restrictions Precautions Precautions: Fall Precaution Comments: baseline visual impairment Restrictions Weight Bearing Restrictions: No       Mobility Bed Mobility               General bed mobility comments: up in recliner  Transfers Overall transfer level: Needs assistance Equipment used: Rolling walker (2 wheeled) Transfers: Sit to/from Stand Sit to Stand: Min guard;Min assist         General transfer comment: min guard from elevated seat surfaces. Min A for regular height seat    Balance Overall balance assessment: Needs assistance Sitting-balance support: Feet supported;Bilateral upper extremity supported;No upper extremity supported;Single extremity supported Sitting balance-Leahy Scale: Fair     Standing balance support: Single extremity supported;Bilateral upper extremity supported;During functional activity Standing balance-Leahy Scale: Poor Standing balance comment: able to static stand briefly without external support. BUE for dynamic  standing/walking and/or prolonged standing                           ADL either performed or assessed with clinical judgement   ADL Overall ADL's : Needs assistance/impaired     Grooming: Min guard;Standing Grooming Details (indicate cue type and reason): able to stand with support of sink to wash hands. Fatigued after. Would needed seated rest breaks for full grooming session in standing                 Toilet Transfer: Min guard;Ambulation;RW;Comfort height toilet;Grab bars   Toileting- Clothing Manipulation and Hygiene: Set up;Supervision/safety;Min guard;Sitting/lateral lean;Sit to/from stand       Functional mobility during ADLs: Min guard;Rolling walker General ADL Comments: discussed energy conservation and strategies for building activity tolerance. Pt walked to bathroom, up to sink to wash hands in standing then ready to sit down 2/2 DOE 1/4 and generalized weakness. Also discussed AE for LB bathing/dressing     Vision   Additional Comments: Reports poor vision and not driving due to this. Did not specify visual deficits   Perception     Praxis      Cognition Arousal/Alertness: Awake/alert Behavior During Therapy: WFL for tasks assessed/performed Overall Cognitive Status: Within Functional Limits for tasks assessed                                          Exercises     Shoulder Instructions       General Comments      Pertinent Vitals/  Pain       Pain Assessment: No/denies pain  Home Living                                          Prior Functioning/Environment              Frequency  Min 2X/week        Progress Toward Goals  OT Goals(current goals can now be found in the care plan section)  Progress towards OT goals: Progressing toward goals  Acute Rehab OT Goals Patient Stated Goal: be able to get better and go home, recover at home OT Goal Formulation: With patient Time For Goal  Achievement: 11/13/20 Potential to Achieve Goals: Good ADL Goals Pt Will Perform Grooming: with modified independence;standing Pt Will Perform Lower Body Bathing: with modified independence;sit to/from stand;sitting/lateral leans Pt Will Transfer to Toilet: with modified independence;ambulating Pt Will Perform Toileting - Clothing Manipulation and hygiene: with modified independence;sitting/lateral leans;sit to/from stand Additional ADL Goal #1: Pt to verbalize at least 2 energy conservation strategies to implement during ADLs/IADLs Additional ADL Goal #2: Pt to demo ability to independently monitor SpO2 and implement pursed lip breathing to maintain O2 >88%  Plan Discharge plan remains appropriate    Co-evaluation                 AM-PAC OT "6 Clicks" Daily Activity     Outcome Measure   Help from another person eating meals?: None Help from another person taking care of personal grooming?: A Little Help from another person toileting, which includes using toliet, bedpan, or urinal?: A Little Help from another person bathing (including washing, rinsing, drying)?: A Lot Help from another person to put on and taking off regular upper body clothing?: A Little Help from another person to put on and taking off regular lower body clothing?: A Lot 6 Click Score: 17    End of Session Equipment Utilized During Treatment: Rolling walker  OT Visit Diagnosis: Unsteadiness on feet (R26.81);Other abnormalities of gait and mobility (R26.89);Muscle weakness (generalized) (M62.81)   Activity Tolerance Patient tolerated treatment well   Patient Left in chair;with call bell/phone within reach (awaiting d/c home today)   Nurse Communication          [id number] OT Time Calculation (min): 22 min  Charges: OT General Charges $OT Visit: 1 Visit OT Treatments $Self Care/Home Management : 8-22 mins  Tyrone Schimke, [place] Pager: [phone] Office:  [phone]    Latasha [name] 11/01/2020, 2:24 PM

## 2020-11-02 ENCOUNTER — Other Ambulatory Visit: Payer: Self-pay

## 2020-11-02 NOTE — Patient Outreach (Signed)
[facility]) Care Management  11/02/2020  MARLEIGH ANGELICO [birth year] [id number]   Telephone call to patient for follow up hospital discharge.  Patient states she is having problems with her bowels where she cannot have a BM. She states she tried a stool softener but that has not helped.  Patient advised she could try some  miralax to help her bowels move. She verbalized understanding.  CM advised patient that CM would try her again tomorrow. She is agreeable.  Plan: RN CM will outreach patient on tomorrow for follow up.   Jone Baseman, RN, MSN [place] Management Care Management Coordinator Direct Line [phone] Cell [phone] Toll Free: [phone]  Fax: [phone]

## 2020-11-03 ENCOUNTER — Other Ambulatory Visit: Payer: Self-pay

## 2020-11-03 NOTE — Patient Outreach (Signed)
[facility]) Care Management  [place]  11/03/2020   NIJAYA BAUMEISTER 1956/04/29 [id number]  Subjective: Spoke with patient after COVID-19 admission. Patient reports feeling better but wonders about recovery.  Discussed with patient COVID recovery and importance of pacing herself. She lives in the home with spouse. Daughter comes over and is very active with her recovery right now. Patient able to bath and dress herself. Family helps with fixing meals right now. Patient main goal is to get back to feeling her normal self.   Discussed [facility] services and support. Patient agreeable to [facility] CM outreach at this time.     Objective:   Encounter Medications:  Outpatient Encounter Medications as of 11/03/2020  Medication Sig  . acetaminophen (TYLENOL) 500 MG tablet Take 500 mg by mouth every 6 (six) hours as needed for mild pain, fever or headache.  . albuterol (PROVENTIL HFA;VENTOLIN HFA) 108 (90 Base) MCG/ACT inhaler Inhale 2 puffs into the lungs every 6 (six) hours as needed for wheezing or shortness of breath.  Marland Kitchen amLODipine (NORVASC) 10 MG tablet Take 1 tablet (10 mg total) by mouth daily.  Marland Kitchen atorvastatin (LIPITOR) 40 MG tablet Take 40 mg by mouth daily.   . carvedilol (COREG) 25 MG tablet Take 1 tablet (25 mg total) by mouth 2 (two) times daily with a meal.  . furosemide (LASIX) 80 MG tablet Take 1 tablet (80 mg total) by mouth daily.  . insulin aspart protamine- aspart (NOVOLOG MIX 70/30) (70-30) 100 UNIT/ML injection Inject 50-60 Units into the skin See admin instructions. Use 60 units every morning and use 50 units every evening  . nitroGLYCERIN (NITROSTAT) 0.4 MG SL tablet Place 1 tablet (0.4 mg total) under the tongue every 5 (five) minutes as needed for chest pain.   No facility-administered encounter medications on file as of 11/03/2020.    Functional Status:  In your present state of health, do you have any difficulty performing the following activities:  11/03/2020 10/24/2020  Hearing? N N  Vision? Y Y  Comment trouble with bright lights/previous eye surgery -  Difficulty concentrating or making decisions? N N  Walking or climbing stairs? Y Y  Comment shortness of breath -  Dressing or bathing? N N  Doing errands, shopping? N N  Preparing Food and eating ? N -  Using the Toilet? N -  In the past six months, have you accidently leaked urine? N -  Do you have problems with loss of bowel control? N -  Managing your Medications? N -  Managing your Finances? N -  Housekeeping or managing your Housekeeping? Y -  Comment family helps -  Some recent data might be hidden    Fall/Depression Screening: Fall Risk  11/03/2020 08/09/2016 01/18/2016  Falls in the past year? 1 Yes No  Number falls in past yr: 0 2 or more -  Injury with Fall? 0 No -  Follow up Falls prevention discussed Falls evaluation completed -   PHQ 2/9 Scores 11/03/2020  PHQ - 2 Score 1    Assessment: Recent COVID 19 admission.   Goals Addressed            This Visit's Progress   . THN-Make and Keep All Appointments       Timeframe:  Long-Range Goal Priority:  High Start Date: 11/03/20                           Expected End  Date:    12/04/20                   Follow Up Date 12/04/20   - arrange a ride through an agency 1 week before appointment - ask family or friend for a ride - keep a calendar with appointment dates    Why is this important?    Part of staying healthy is seeing the doctor for follow-up care.   If you forget your appointments, there are some things you can do to stay on track.    Notes: Patient does not drive. Patient's daughter takes her to appointments.  [employer] given.      Ala Bent My Medicine       Timeframe:  Short-Term Goal Priority:  High Start Date:  11/03/20                           Expected End Date:   12/04/20                    Follow Up Date 12/04/20   - keep a list of all the medicines I take;  vitamins and herbals too - learn to read medicine labels    Why is this important?   . These steps will help you keep on track with your medicines.   Notes: Patient takes medications as prescribed.    . THN-Matintain My Quality of Life-Get back to my normal self       Timeframe:  Short-Term Goal Priority:  High Start Date:  11/03/20                           Expected End Date:    12/04/20                   Follow Up Date 12/04/20    - make shared treatment decisions with doctor - spend time with a child every day, borrow one if I have to    Why is this important?    Having a long-term illness can be scary.   It can also be stressful for you and your caregiver.   These steps may help.    Notes: Patient yearns to get back to normal and interact with her family.    . THN-Monitor and Manage My Blood Sugar-Diabetes Type 2       Timeframe:  Long-Range Goal Priority:  Medium Start Date:     11/03/20                        Expected End Date:  01/04/21                     Follow Up Date 01/04/21   - check blood sugar at prescribed times - check blood sugar if I feel it is too high or too low - take the blood sugar log to all doctor visits    Why is this important?    Checking your blood sugar at home helps to keep it from getting very high or very low.   Writing the results in a diary or log helps the doctor know how to care for you.   Your blood sugar log should have the time, date and the results.   Also, write down the amount of insulin or other medicine that you take.  Other information, like what you ate, exercise done and how you were feeling, will also be helpful.     Notes: 11/03/20 Blood sugar this am 109.  Patient checks sugars twice daily.      . THN-Set My Target A1C-Diabetes Type 2       Timeframe:  Short-Term Goal Priority:  Medium Start Date:   11/03/20                          Expected End Date:      01/04/21                 Follow Up Date 01/04/21   -  set target A1C    Why is this important?    Your target A1C is decided together by you and your doctor.   It is based on several things like your age and other health issues.    Notes: Patient reports A1c down to 7.1 but endocrinologists wants it lower than 7.0       Plan: RN CM will provide ongoing education and support to patient through phone calls.   RN CM will send welcome packet with consent to patient.   RN CM will send initial barriers letter, assessment, and care plan to primary care physician.   RN CM will contact patient next next month and patient agrees to next contact.   Follow-up:  Patient agrees to Care Plan and Follow-up.   Jone Baseman, RN, MSN [place] Management Care Management Coordinator Direct Line [phone] Cell [phone] Toll Free: [phone]  Fax: [phone]

## 2020-11-03 NOTE — Patient Instructions (Signed)
Goals Addressed            This Visit's Progress   . THN-Make and Keep All Appointments       Timeframe:  Long-Range Goal Priority:  High Start Date: 11/03/20                           Expected End Date:    12/04/20                   Follow Up Date 12/04/20   - arrange a ride through an agency 1 week before appointment - ask family or friend for a ride - keep a calendar with appointment dates    Why is this important?    Part of staying healthy is seeing the doctor for follow-up care.   If you forget your appointments, there are some things you can do to stay on track.    Notes: Patient does not drive. Patient's daughter takes her to appointments.  [employer] given.      [place] Bent My Medicine       Timeframe:  Short-Term Goal Priority:  High Start Date:  11/03/20                           Expected End Date:   12/04/20                    Follow Up Date 12/04/20   - keep a list of all the medicines I take; vitamins and herbals too - learn to read medicine labels    Why is this important?   . These steps will help you keep on track with your medicines.   Notes: Patient takes medications as prescribed.    . THN-Matintain My Quality of Life-Get back to my normal self       Timeframe:  Short-Term Goal Priority:  High Start Date:  11/03/20                           Expected End Date:    12/04/20                   Follow Up Date 12/04/20    - make shared treatment decisions with doctor - spend time with a child every day, borrow one if I have to    Why is this important?    Having a long-term illness can be scary.   It can also be stressful for you and your caregiver.   These steps may help.    Notes: Patient yearns to get back to normal and interact with her family.    . THN-Monitor and Manage My Blood Sugar-Diabetes Type 2       Timeframe:  Long-Range Goal Priority:  Medium Start Date:     11/03/20                        Expected End Date:   01/04/21                     Follow Up Date 01/04/21   - check blood sugar at prescribed times - check blood sugar if I feel it is too high or too low - take the blood sugar log to all doctor visits    Why is this important?  Checking your blood sugar at home helps to keep it from getting very high or very low.   Writing the results in a diary or log helps the doctor know how to care for you.   Your blood sugar log should have the time, date and the results.   Also, write down the amount of insulin or other medicine that you take.   Other information, like what you ate, exercise done and how you were feeling, will also be helpful.     Notes: 11/03/20 Blood sugar this am 109.  Patient checks sugars twice daily.      . THN-Set My Target A1C-Diabetes Type 2       Timeframe:  Short-Term Goal Priority:  Medium Start Date:   11/03/20                          Expected End Date:      01/04/21                 Follow Up Date 01/04/21   - set target A1C    Why is this important?    Your target A1C is decided together by you and your doctor.   It is based on several things like your age and other health issues.    Notes: Patient reports A1c down to 7.1 but endocrinologists wants it lower than 7.0

## 2020-11-07 ENCOUNTER — Other Ambulatory Visit: Payer: Self-pay

## 2020-11-07 DIAGNOSIS — D631 Anemia in chronic kidney disease: Secondary | ICD-10-CM | POA: Diagnosis not present

## 2020-11-07 DIAGNOSIS — I130 Hypertensive heart and chronic kidney disease with heart failure and stage 1 through stage 4 chronic kidney disease, or unspecified chronic kidney disease: Secondary | ICD-10-CM | POA: Diagnosis not present

## 2020-11-07 DIAGNOSIS — E1122 Type 2 diabetes mellitus with diabetic chronic kidney disease: Secondary | ICD-10-CM | POA: Diagnosis not present

## 2020-11-07 DIAGNOSIS — N184 Chronic kidney disease, stage 4 (severe): Secondary | ICD-10-CM | POA: Diagnosis not present

## 2020-11-07 DIAGNOSIS — J1282 Pneumonia due to coronavirus disease 2019: Secondary | ICD-10-CM | POA: Diagnosis not present

## 2020-11-07 DIAGNOSIS — J9621 Acute and chronic respiratory failure with hypoxia: Secondary | ICD-10-CM | POA: Diagnosis not present

## 2020-11-07 DIAGNOSIS — U071 COVID-19: Secondary | ICD-10-CM | POA: Diagnosis not present

## 2020-11-07 DIAGNOSIS — I5032 Chronic diastolic (congestive) heart failure: Secondary | ICD-10-CM | POA: Diagnosis not present

## 2020-11-07 NOTE — Patient Instructions (Signed)
Goals Addressed            This Visit's Progress   . [facility]-Make and Keep All Appointments       Timeframe:  Long-Range Goal Priority:  High Start Date: 11/03/20                           Expected End Date:    12/04/20                   Follow Up Date 12/04/20   - arrange a ride through an agency 1 week before appointment - ask family or friend for a ride - keep a calendar with appointment dates    Why is this important?    Part of staying healthy is seeing the doctor for follow-up care.   If you forget your appointments, there are some things you can do to stay on track.    Notes: Patient does not drive. Patient's daughter takes her to appointments.  [employer] given.  11/07/20 patient to make follow up with physician    . THN-[employer] My Quality of Life-Get back to my normal self       Timeframe:  Short-Term Goal Priority:  High Start Date:  11/03/20                           Expected End Date:    12/04/20                   Follow Up Date 12/04/20    - make shared treatment decisions with doctor - spend time with a child every day, borrow one if I have to    Why is this important?    Having a long-term illness can be scary.   It can also be stressful for you and your caregiver.   These steps may help.    Notes: Patient yearns to get back to normal and interact with her family.11/07/20- patient feels bad about slow recovery. Encouraged patient to hang in there.

## 2020-11-07 NOTE — Patient Outreach (Addendum)
[facility]) Care Management  11/07/2020  Latasha [name] Dec 29, 1955 [id number]   EMMI- General Discharge RED ON EMMI ALERT Day # 4 Date: 11/06/20 Red Alert Reason:  Read discharge papers?   No  Lost interest in things?   Yes  Sad/hopeless/anxious/empty?   Yes    Outreach attempt: spoke with patient. She reports not feeling the best but feels some better since exercising with PT today.  Addressed red alerts.  Patient's daughter read discharge papers and they have no questions.  Patient reports feeling down due to being sick.  Encouraged patient to allow herself to slowly get back to her normal and how COVID recovery is lengthy.  Encouraged a healthy diet and complete the exercises given by the therapist when PT does not visit.  She verbalized understanding. Patient reports having a fall since out last conversation. Encouraged fall prevention.  She verbalized understanding and states she will be calling her PCP after conversation with CM for follow up.  .     Plan: RN CM will outreach again this month. Patient agrees to follow-up and careplan.   Jone Baseman, RN, MSN [facility] Care Management Care Management Coordinator Direct Line [phone] Toll Free: [phone]  Fax: [phone]

## 2020-11-08 ENCOUNTER — Inpatient Hospital Stay (HOSPITAL_COMMUNITY)
Admission: EM | Admit: 2020-11-08 | Discharge: 2020-11-12 | DRG: 377 | Disposition: A | Discharge status: Home / Self Care | Payer: Medicare HMO | Attending: Internal Medicine | Admitting: Internal Medicine

## 2020-11-08 ENCOUNTER — Other Ambulatory Visit: Payer: Self-pay

## 2020-11-08 ENCOUNTER — Emergency Department (HOSPITAL_COMMUNITY): Payer: Medicare HMO

## 2020-11-08 ENCOUNTER — Inpatient Hospital Stay (HOSPITAL_COMMUNITY): Payer: Medicare HMO

## 2020-11-08 ENCOUNTER — Encounter (HOSPITAL_COMMUNITY): Payer: Self-pay

## 2020-11-08 DIAGNOSIS — K921 Melena: Secondary | ICD-10-CM | POA: Diagnosis not present

## 2020-11-08 DIAGNOSIS — M5137 Other intervertebral disc degeneration, lumbosacral region: Secondary | ICD-10-CM | POA: Diagnosis not present

## 2020-11-08 DIAGNOSIS — U071 COVID-19: Secondary | ICD-10-CM

## 2020-11-08 DIAGNOSIS — I10 Essential (primary) hypertension: Secondary | ICD-10-CM | POA: Diagnosis present

## 2020-11-08 DIAGNOSIS — Y92019 Unspecified place in single-family (private) house as the place of occurrence of the external cause: Secondary | ICD-10-CM | POA: Diagnosis not present

## 2020-11-08 DIAGNOSIS — R5381 Other malaise: Secondary | ICD-10-CM | POA: Diagnosis not present

## 2020-11-08 DIAGNOSIS — R52 Pain, unspecified: Secondary | ICD-10-CM | POA: Diagnosis not present

## 2020-11-08 DIAGNOSIS — E782 Mixed hyperlipidemia: Secondary | ICD-10-CM | POA: Diagnosis not present

## 2020-11-08 DIAGNOSIS — S82831A Other fracture of upper and lower end of right fibula, initial encounter for closed fracture: Secondary | ICD-10-CM

## 2020-11-08 DIAGNOSIS — S82409A Unspecified fracture of shaft of unspecified fibula, initial encounter for closed fracture: Secondary | ICD-10-CM | POA: Diagnosis present

## 2020-11-08 DIAGNOSIS — M7989 Other specified soft tissue disorders: Secondary | ICD-10-CM | POA: Diagnosis not present

## 2020-11-08 DIAGNOSIS — I959 Hypotension, unspecified: Secondary | ICD-10-CM | POA: Diagnosis not present

## 2020-11-08 DIAGNOSIS — E1122 Type 2 diabetes mellitus with diabetic chronic kidney disease: Secondary | ICD-10-CM | POA: Diagnosis not present

## 2020-11-08 DIAGNOSIS — E785 Hyperlipidemia, unspecified: Secondary | ICD-10-CM | POA: Diagnosis present

## 2020-11-08 DIAGNOSIS — R296 Repeated falls: Secondary | ICD-10-CM | POA: Diagnosis present

## 2020-11-08 DIAGNOSIS — Z8249 Family history of ischemic heart disease and other diseases of the circulatory system: Secondary | ICD-10-CM | POA: Diagnosis not present

## 2020-11-08 DIAGNOSIS — K2901 Acute gastritis with bleeding: Principal | ICD-10-CM | POA: Diagnosis present

## 2020-11-08 DIAGNOSIS — I1310 Hypertensive heart and chronic kidney disease without heart failure, with stage 1 through stage 4 chronic kidney disease, or unspecified chronic kidney disease: Secondary | ICD-10-CM | POA: Diagnosis present

## 2020-11-08 DIAGNOSIS — Z6841 Body Mass Index (BMI) 40.0 and over, adult: Secondary | ICD-10-CM

## 2020-11-08 DIAGNOSIS — K264 Chronic or unspecified duodenal ulcer with hemorrhage: Secondary | ICD-10-CM | POA: Diagnosis not present

## 2020-11-08 DIAGNOSIS — Z8616 Personal history of COVID-19: Secondary | ICD-10-CM | POA: Diagnosis not present

## 2020-11-08 DIAGNOSIS — N179 Acute kidney failure, unspecified: Secondary | ICD-10-CM | POA: Diagnosis not present

## 2020-11-08 DIAGNOSIS — R0902 Hypoxemia: Secondary | ICD-10-CM | POA: Diagnosis not present

## 2020-11-08 DIAGNOSIS — Z992 Dependence on renal dialysis: Secondary | ICD-10-CM

## 2020-11-08 DIAGNOSIS — Z833 Family history of diabetes mellitus: Secondary | ICD-10-CM

## 2020-11-08 DIAGNOSIS — Z79899 Other long term (current) drug therapy: Secondary | ICD-10-CM

## 2020-11-08 DIAGNOSIS — W19XXXA Unspecified fall, initial encounter: Secondary | ICD-10-CM | POA: Diagnosis not present

## 2020-11-08 DIAGNOSIS — E119 Type 2 diabetes mellitus without complications: Secondary | ICD-10-CM | POA: Diagnosis not present

## 2020-11-08 DIAGNOSIS — J9601 Acute respiratory failure with hypoxia: Secondary | ICD-10-CM | POA: Diagnosis present

## 2020-11-08 DIAGNOSIS — Z820 Family history of epilepsy and other diseases of the nervous system: Secondary | ICD-10-CM | POA: Diagnosis not present

## 2020-11-08 DIAGNOSIS — K3189 Other diseases of stomach and duodenum: Secondary | ICD-10-CM | POA: Diagnosis not present

## 2020-11-08 DIAGNOSIS — Z9181 History of falling: Secondary | ICD-10-CM

## 2020-11-08 DIAGNOSIS — K219 Gastro-esophageal reflux disease without esophagitis: Secondary | ICD-10-CM | POA: Diagnosis present

## 2020-11-08 DIAGNOSIS — Z90710 Acquired absence of both cervix and uterus: Secondary | ICD-10-CM | POA: Diagnosis not present

## 2020-11-08 DIAGNOSIS — N184 Chronic kidney disease, stage 4 (severe): Secondary | ICD-10-CM | POA: Diagnosis not present

## 2020-11-08 DIAGNOSIS — R7989 Other specified abnormal findings of blood chemistry: Secondary | ICD-10-CM

## 2020-11-08 DIAGNOSIS — M25572 Pain in left ankle and joints of left foot: Secondary | ICD-10-CM

## 2020-11-08 DIAGNOSIS — M2550 Pain in unspecified joint: Secondary | ICD-10-CM | POA: Diagnosis not present

## 2020-11-08 DIAGNOSIS — I5032 Chronic diastolic (congestive) heart failure: Secondary | ICD-10-CM | POA: Diagnosis present

## 2020-11-08 DIAGNOSIS — I493 Ventricular premature depolarization: Secondary | ICD-10-CM | POA: Diagnosis not present

## 2020-11-08 DIAGNOSIS — X500XXA Overexertion from strenuous movement or load, initial encounter: Secondary | ICD-10-CM | POA: Diagnosis not present

## 2020-11-08 DIAGNOSIS — K2980 Duodenitis without bleeding: Secondary | ICD-10-CM | POA: Diagnosis not present

## 2020-11-08 DIAGNOSIS — Z794 Long term (current) use of insulin: Secondary | ICD-10-CM | POA: Diagnosis not present

## 2020-11-08 DIAGNOSIS — L989 Disorder of the skin and subcutaneous tissue, unspecified: Secondary | ICD-10-CM | POA: Diagnosis not present

## 2020-11-08 DIAGNOSIS — K922 Gastrointestinal hemorrhage, unspecified: Secondary | ICD-10-CM | POA: Diagnosis not present

## 2020-11-08 DIAGNOSIS — J9621 Acute and chronic respiratory failure with hypoxia: Secondary | ICD-10-CM | POA: Diagnosis not present

## 2020-11-08 DIAGNOSIS — K429 Umbilical hernia without obstruction or gangrene: Secondary | ICD-10-CM | POA: Diagnosis not present

## 2020-11-08 DIAGNOSIS — D62 Acute posthemorrhagic anemia: Secondary | ICD-10-CM

## 2020-11-08 DIAGNOSIS — K269 Duodenal ulcer, unspecified as acute or chronic, without hemorrhage or perforation: Secondary | ICD-10-CM | POA: Diagnosis not present

## 2020-11-08 DIAGNOSIS — S8261XA Displaced fracture of lateral malleolus of right fibula, initial encounter for closed fracture: Secondary | ICD-10-CM | POA: Diagnosis not present

## 2020-11-08 DIAGNOSIS — Z7401 Bed confinement status: Secondary | ICD-10-CM | POA: Diagnosis not present

## 2020-11-08 DIAGNOSIS — J9600 Acute respiratory failure, unspecified whether with hypoxia or hypercapnia: Secondary | ICD-10-CM | POA: Diagnosis not present

## 2020-11-08 DIAGNOSIS — K2970 Gastritis, unspecified, without bleeding: Secondary | ICD-10-CM | POA: Diagnosis not present

## 2020-11-08 LAB — CBC WITH DIFFERENTIAL/PLATELET
Abs Immature Granulocytes: 0.14 10*3/uL — ABNORMAL HIGH (ref 0.00–0.07)
Basophils Absolute: 0 10*3/uL (ref 0.0–0.1)
Basophils Relative: 0 %
Eosinophils Absolute: 0.2 10*3/uL (ref 0.0–0.5)
Eosinophils Relative: 2 %
HCT: 13.7 % — ABNORMAL LOW (ref 36.0–46.0)
Hemoglobin: 4 g/dL — CL (ref 12.0–15.0)
Immature Granulocytes: 2 %
Lymphocytes Relative: 16 %
Lymphs Abs: 1.4 10*3/uL (ref 0.7–4.0)
MCH: 28.4 pg (ref 26.0–34.0)
MCHC: 29.2 g/dL — ABNORMAL LOW (ref 30.0–36.0)
MCV: 97.2 fL (ref 80.0–100.0)
Monocytes Absolute: 0.5 10*3/uL (ref 0.1–1.0)
Monocytes Relative: 6 %
Neutro Abs: 6.4 10*3/uL (ref 1.7–7.7)
Neutrophils Relative %: 74 %
Platelets: 272 10*3/uL (ref 150–400)
RBC: 1.41 MIL/uL — ABNORMAL LOW (ref 3.87–5.11)
RDW: 16.1 % — ABNORMAL HIGH (ref 11.5–15.5)
WBC: 8.6 10*3/uL (ref 4.0–10.5)
nRBC: 1.9 % — ABNORMAL HIGH (ref 0.0–0.2)

## 2020-11-08 LAB — COMPREHENSIVE METABOLIC PANEL
ALT: 15 U/L (ref 0–44)
AST: 16 U/L (ref 15–41)
Albumin: 2.3 g/dL — ABNORMAL LOW (ref 3.5–5.0)
Alkaline Phosphatase: 58 U/L (ref 38–126)
Anion gap: 12 (ref 5–15)
BUN: 67 mg/dL — ABNORMAL HIGH (ref 8–23)
CO2: 26 mmol/L (ref 22–32)
Calcium: 7.4 mg/dL — ABNORMAL LOW (ref 8.9–10.3)
Chloride: 101 mmol/L (ref 98–111)
Creatinine, Ser: 2.9 mg/dL — ABNORMAL HIGH (ref 0.44–1.00)
GFR, Estimated: 18 mL/min — ABNORMAL LOW (ref >60)
Glucose, Bld: 167 mg/dL — ABNORMAL HIGH (ref 70–99)
Potassium: 4.6 mmol/L (ref 3.5–5.1)
Sodium: 139 mmol/L (ref 135–145)
Total Bilirubin: 0.4 mg/dL (ref 0.3–1.2)
Total Protein: 6 g/dL — ABNORMAL LOW (ref 6.5–8.1)

## 2020-11-08 LAB — PREPARE RBC (CROSSMATCH)

## 2020-11-08 LAB — CBC
HCT: 15.8 % — ABNORMAL LOW (ref 36.0–46.0)
Hemoglobin: 4.9 g/dL — CL (ref 12.0–15.0)
MCH: 29.3 pg (ref 26.0–34.0)
MCHC: 31 g/dL (ref 30.0–36.0)
MCV: 94.6 fL (ref 80.0–100.0)
Platelets: 227 10*3/uL (ref 150–400)
RBC: 1.67 MIL/uL — ABNORMAL LOW (ref 3.87–5.11)
RDW: 15.4 % (ref 11.5–15.5)
WBC: 7.8 10*3/uL (ref 4.0–10.5)
nRBC: 1.3 % — ABNORMAL HIGH (ref 0.0–0.2)

## 2020-11-08 LAB — PROCALCITONIN: Procalcitonin: 0.26 ng/mL

## 2020-11-08 LAB — OCCULT BLOOD X 1 CARD TO LAB, STOOL: Fecal Occult Bld: POSITIVE — AB

## 2020-11-08 LAB — FERRITIN: Ferritin: 621 ng/mL — ABNORMAL HIGH (ref 11–307)

## 2020-11-08 LAB — BRAIN NATRIURETIC PEPTIDE: B Natriuretic Peptide: 133.4 pg/mL — ABNORMAL HIGH (ref 0.0–100.0)

## 2020-11-08 LAB — LACTATE DEHYDROGENASE: LDH: 329 U/L — ABNORMAL HIGH (ref 98–192)

## 2020-11-08 LAB — ABO/RH: ABO/RH(D): A POS

## 2020-11-08 LAB — PROTIME-INR
INR: 1.2 (ref 0.8–1.2)
Prothrombin Time: 15 seconds (ref 11.4–15.2)

## 2020-11-08 LAB — GLUCOSE, CAPILLARY
Glucose-Capillary: 171 mg/dL — ABNORMAL HIGH (ref 70–99)
Glucose-Capillary: 166 mg/dL — ABNORMAL HIGH (ref 70–99)

## 2020-11-08 LAB — C-REACTIVE PROTEIN: CRP: 6.5 mg/dL — ABNORMAL HIGH (ref <1.0)

## 2020-11-08 LAB — TRIGLYCERIDES: Triglycerides: 171 mg/dL — ABNORMAL HIGH (ref <150)

## 2020-11-08 LAB — D-DIMER, QUANTITATIVE: D-Dimer, Quant: 2.31 ug/mL-FEU — ABNORMAL HIGH (ref 0.00–0.50)

## 2020-11-08 LAB — TROPONIN I (HIGH SENSITIVITY): Troponin I (High Sensitivity): 8 ng/L (ref <18)

## 2020-11-08 LAB — FIBRINOGEN: Fibrinogen: 450 mg/dL (ref 210–475)

## 2020-11-08 MED ORDER — ALBUTEROL SULFATE HFA 108 (90 BASE) MCG/ACT IN AERS: 2.0000 | INHALATION_SPRAY | Freq: Four times a day (QID) | RESPIRATORY_TRACT | Status: DC | PRN

## 2020-11-08 MED ORDER — INSULIN ASPART 100 UNIT/ML ~~LOC~~ SOLN
0.0000 [IU] | Freq: Every day | SUBCUTANEOUS | Status: DC
  Administered 2020-11-09: 5 [IU] via SUBCUTANEOUS
  Administered 2020-11-10: 4 [IU] via SUBCUTANEOUS
  Administered 2020-11-11: 2 [IU] via SUBCUTANEOUS
  Filled 2020-11-08: qty 0.05

## 2020-11-08 MED ORDER — ZINC SULFATE 220 (50 ZN) MG PO CAPS
220.0000 mg | ORAL_CAPSULE | Freq: Every day | ORAL | Status: DC
  Administered 2020-11-10 – 2020-11-12 (×3): 220 mg via ORAL
  Filled 2020-11-08 (×3): qty 1

## 2020-11-08 MED ORDER — SODIUM CHLORIDE 0.9 % IV SOLN
80.0000 mg | Freq: Once | INTRAVENOUS | Status: AC
  Administered 2020-11-08: 80 mg via INTRAVENOUS
  Filled 2020-11-08: qty 80

## 2020-11-08 MED ORDER — PROPOFOL 500 MG/50ML IV EMUL
INTRAVENOUS | Status: AC
  Filled 2020-11-08: qty 50

## 2020-11-08 MED ORDER — ONDANSETRON HCL 4 MG PO TABS: 4.0000 mg | ORAL_TABLET | Freq: Four times a day (QID) | ORAL | Status: DC | PRN

## 2020-11-08 MED ORDER — INSULIN ASPART 100 UNIT/ML ~~LOC~~ SOLN
0.0000 [IU] | Freq: Three times a day (TID) | SUBCUTANEOUS | Status: DC
  Administered 2020-11-08: 3 [IU] via SUBCUTANEOUS
  Administered 2020-11-09: 11 [IU] via SUBCUTANEOUS
  Administered 2020-11-09: 3 [IU] via SUBCUTANEOUS
  Administered 2020-11-10: 8 [IU] via SUBCUTANEOUS
  Administered 2020-11-10: 11 [IU] via SUBCUTANEOUS
  Administered 2020-11-10: 8 [IU] via SUBCUTANEOUS
  Administered 2020-11-11 (×2): 5 [IU] via SUBCUTANEOUS
  Administered 2020-11-11 – 2020-11-12 (×3): 3 [IU] via SUBCUTANEOUS
  Filled 2020-11-08: qty 0.15

## 2020-11-08 MED ORDER — GUAIFENESIN-DM 100-10 MG/5ML PO SYRP: 10.0000 mL | ORAL_SOLUTION | ORAL | Status: DC | PRN

## 2020-11-08 MED ORDER — ONDANSETRON HCL 4 MG/2ML IJ SOLN
4.0000 mg | Freq: Once | INTRAMUSCULAR | Status: AC
  Administered 2020-11-08: 4 mg via INTRAVENOUS
  Filled 2020-11-08: qty 2

## 2020-11-08 MED ORDER — ONDANSETRON HCL 4 MG/2ML IJ SOLN: 4.0000 mg | Freq: Four times a day (QID) | INTRAMUSCULAR | Status: DC | PRN

## 2020-11-08 MED ORDER — PANTOPRAZOLE SODIUM 40 MG IV SOLR
40.0000 mg | Freq: Two times a day (BID) | INTRAVENOUS | Status: DC
  Administered 2020-11-11 – 2020-11-12 (×2): 40 mg via INTRAVENOUS
  Filled 2020-11-08 (×2): qty 40

## 2020-11-08 MED ORDER — FENTANYL CITRATE (PF) 100 MCG/2ML IJ SOLN
12.5000 ug | Freq: Four times a day (QID) | INTRAMUSCULAR | Status: DC | PRN
  Administered 2020-11-08 – 2020-11-09 (×2): 12.5 ug via INTRAVENOUS
  Filled 2020-11-08 (×2): qty 2

## 2020-11-08 MED ORDER — FENTANYL CITRATE (PF) 100 MCG/2ML IJ SOLN
50.0000 ug | Freq: Once | INTRAMUSCULAR | Status: AC
  Administered 2020-11-08: 50 ug via INTRAVENOUS
  Filled 2020-11-08: qty 2

## 2020-11-08 MED ORDER — SODIUM CHLORIDE 0.9% IV SOLUTION: Freq: Once | INTRAVENOUS | Status: AC

## 2020-11-08 MED ORDER — SODIUM CHLORIDE 0.9 % IV SOLN
8.0000 mg/h | INTRAVENOUS | Status: AC
  Administered 2020-11-08 – 2020-11-10 (×6): 8 mg/h via INTRAVENOUS
  Filled 2020-11-08 (×12): qty 80

## 2020-11-08 MED ORDER — FUROSEMIDE 10 MG/ML IJ SOLN
40.0000 mg | Freq: Every day | INTRAMUSCULAR | Status: AC
  Administered 2020-11-08 – 2020-11-09 (×2): 40 mg via INTRAVENOUS
  Filled 2020-11-08 (×2): qty 4

## 2020-11-08 MED ORDER — ASCORBIC ACID 500 MG PO TABS
500.0000 mg | ORAL_TABLET | Freq: Every day | ORAL | Status: DC
  Administered 2020-11-10 – 2020-11-12 (×3): 500 mg via ORAL
  Filled 2020-11-08 (×4): qty 1

## 2020-11-08 NOTE — ED Notes (Signed)
Patient transported to CT 

## 2020-11-08 NOTE — ED Notes (Signed)
[facility] 4.0, [facility] notified.

## 2020-11-08 NOTE — CV Procedure (Signed)
BLE venous duplex.  Results can be found under chart review under CV PROC. 11/08/2020 1:16 PM Coltyn Hanning RVT, RDMS

## 2020-11-08 NOTE — Progress Notes (Addendum)
Patient received 1u pRBCs with post-transfusion Hgb of 4.9.  Per anesthesia, Hgb must be >6.8 in order to proceed with EGD.  Placed order for 2 additional units of pRBCs.  EGD may be postponed until tomorrow, given need for additional blood transfusions.  Patient has stable BP and HR at this time.  However, if patient destabilizes, she would require emergent EGD.  [facility] will follow.  Salley Slaughter, [place] [phone]

## 2020-11-08 NOTE — Progress Notes (Signed)
Pt received from ED via bed. Pt currently denies pain.

## 2020-11-08 NOTE — Consult Note (Signed)
Referring Provider: ED Primary Care Physician:  Shirline Frees, MD [facility])  Reason for Consultation:  Severe anemia, melena  HPI: Latasha [name] is a 65 y.o. female with recent Covid pneumonia and AKI on CKD requiring HD presenting for consultation of severe anemia and melena.  Patient states that over the last week, she has felt progressively weak and has had multiple dark stools.  Denies hematochezia, diarrhea or constipation.  She also has noted some epigastric abdominal pain for the past few days.  She had one episode of vomiting but states this was nonbloody and nonbilious.  Denies current nausea.  Reports poor appetite over the last several weeks, but does not believe she has lost any weight.  Denies any dysphagia or GERD.  She is feeling short of breath and is currently on 4L O2.  On arrival, hemoglobin 4.0, decreased from 7.1 one week ago.  She denies aspirin, blood thinner, or NSAID use.  Denies family history of colon cancer or gastrointestinal malignancy.  She has never had an EGD or colonoscopy.   Past Medical History:  Diagnosis Date  . BPPV (benign paroxysmal positional vertigo)   . Chronic congestive heart failure with left ventricular diastolic dysfunction ([place])   . CKD (chronic kidney disease), stage IV ([place]) 05/15/2017  . Diabetes mellitus   . Hyperlipidemia   . Hypertension   . Left atrial enlargement   . Morbid obesity with BMI of 40.0-44.9, adult ([place])   . Palpitations     Past Surgical History:  Procedure Laterality Date  . ABDOMINAL HYSTERECTOMY    . EYE SURGERY     cataract/left eye, right eye had bleeding in back  . IR FLUORO GUIDE CV LINE RIGHT  10/26/2020  . IR US GUIDE VASC ACCESS RIGHT  10/26/2020  . KNEE SURGERY    . LEFT HEART CATH AND CORONARY ANGIOGRAPHY N/A 03/09/2018   Procedure: LEFT HEART CATH AND CORONARY ANGIOGRAPHY;  Surgeon: Lorretta Harp, MD;  Location: [facility];  Service: Cardiovascular;  Laterality: N/A;  . TRIGGER FINGER  RELEASE      Prior to Admission medications   Medication Sig Start Date End Date Taking? Authorizing Provider  acetaminophen (TYLENOL) 500 MG tablet Take 500 mg by mouth every 6 (six) hours as needed for mild pain, fever or headache.   Yes [provider]  albuterol (PROVENTIL HFA;VENTOLIN HFA) 108 (90 Base) MCG/ACT inhaler Inhale 2 puffs into the lungs every 6 (six) hours as needed for wheezing or shortness of breath.   Yes [provider]  amLODipine (NORVASC) 10 MG tablet Take 1 tablet (10 mg total) by mouth daily. 05/18/17  Yes Eugenie Filler, MD  atorvastatin (LIPITOR) 40 MG tablet Take 40 mg by mouth daily.    Yes [provider]  carvedilol (COREG) 25 MG tablet Take 1 tablet (25 mg total) by mouth 2 (two) times daily with a meal. 03/10/18  Yes Florencia Reasons, MD  furosemide (LASIX) 80 MG tablet Take 1 tablet (80 mg total) by mouth daily. 11/02/20 12/02/20 Yes Donne Hazel, MD  insulin aspart protamine- aspart (NOVOLOG MIX 70/30) (70-30) 100 UNIT/ML injection Inject 50-60 Units into the skin See admin instructions. Use 60 units every morning and use 50 units every evening   Yes [provider]  nitroGLYCERIN (NITROSTAT) 0.4 MG SL tablet Place 1 tablet (0.4 mg total) under the tongue every 5 (five) minutes as needed for chest pain. 03/10/18  Yes Florencia Reasons, MD    Scheduled  Meds: . sodium chloride   Intravenous Once  . [START ON 11/11/2020] pantoprazole  40 mg Intravenous Q12H   Continuous Infusions: . pantoprozole (PROTONIX) infusion 8 mg/hr (11/08/20 0947)   PRN Meds:.  Allergies as of 11/08/2020 - Review Complete 11/08/2020  Allergen Reaction Noted  . Hydrocodone-acetaminophen Other (See Comments) 02/04/2020  . Oxycodone hcl Other (See Comments) 02/04/2020    Family History  Problem Relation Age of Onset  . Heart disease Father   . Hypertension Father   . Alzheimer's disease Mother   . Diabetes Mother   . Diabetes Sister   . Dementia Other      Social History   Socioeconomic History  . Marital status: Married    Spouse name: Not on file  . Number of children: Not on file  . Years of education: Not on file  . Highest education level: Not on file  Occupational History  . Occupation: retired  Tobacco Use  . Smoking status: Never Smoker  . Smokeless tobacco: Never Used  Substance and Sexual Activity  . Alcohol use: Yes    Comment:  rare  . Drug use: No  . Sexual activity: Not on file  Other Topics Concern  . Not on file  Social History Narrative  . Not on file   Social Determinants of Health   Financial Resource Strain: Not on file  Food Insecurity: Not on file  Transportation Needs: No Transportation Needs  . Lack of Transportation (Medical): No  . Lack of Transportation (Non-Medical): No  Physical Activity: Not on file  Stress: Not on file  Social Connections: Not on file  Intimate Partner Violence: Not on file    Review of Systems: Review of Systems  Constitutional: Positive for malaise/fatigue. Negative for weight loss.  HENT: Negative for hearing loss and tinnitus.   Eyes: Negative for pain and redness.  Respiratory: Positive for shortness of breath. Negative for cough.   Cardiovascular: Negative for chest pain and palpitations.  Gastrointestinal: Positive for abdominal pain, melena, nausea and vomiting (x1). Negative for blood in stool, constipation, diarrhea and heartburn.  Genitourinary: Negative for flank pain and hematuria.  Musculoskeletal: Positive for falls. Negative for neck pain.  Skin: Negative for itching and rash.  Neurological: Negative for seizures and loss of consciousness.  Endo/Heme/Allergies: Negative for polydipsia. Does not bruise/bleed easily.  Psychiatric/Behavioral: Negative for substance abuse. The patient is not nervous/anxious.      Physical Exam: Vital signs: Vitals:   11/08/20 0915 11/08/20 0945  BP: 124/85 (!) 121/48  Pulse: 71 74  Resp: 16 15  Temp:    SpO2:  97% 100%     Physical Exam Vitals reviewed.  Constitutional:      General: She is not in acute distress.    Appearance: She is obese. She is ill-appearing.     Interventions: Nasal cannula in place.  HENT:     Head: Normocephalic and atraumatic.     Nose: Nose normal. No congestion.     Mouth/Throat:     Mouth: Mucous membranes are moist.     Pharynx: Oropharynx is clear.  Eyes:     Extraocular Movements: Extraocular movements intact.     Comments: Conjunctival pallor  Cardiovascular:     Rate and Rhythm: Normal rate and regular rhythm.     Pulses: Normal pulses.  Pulmonary:     Effort: Pulmonary effort is normal. No respiratory distress.  Abdominal:     General: Bowel sounds are normal. There is no distension.  Palpations: Abdomen is soft. There is no mass.     Tenderness: There is abdominal tenderness (Mild, epigastric). There is no guarding or rebound.     Hernia: No hernia is present.  Musculoskeletal:        General: No deformity.     Cervical back: Normal range of motion and neck supple.     Right lower leg: Edema present.     Left lower leg: Edema present.  Skin:    General: Skin is warm and dry.  Neurological:     General: No focal deficit present.     Mental Status: She is oriented to person, place, and time. She is lethargic.  Psychiatric:        Mood and Affect: Mood normal.        Behavior: Behavior normal. Behavior is cooperative.     GI:  Lab Results: Recent Labs    11/08/20 0743  WBC 8.6  HGB 4.0*  HCT 13.7*  PLT 272   BMET Recent Labs    11/08/20 0743  NA 139  K 4.6  CL 101  CO2 26  GLUCOSE 167*  BUN 67*  CREATININE 2.90*  CALCIUM 7.4*   LFT Recent Labs    11/08/20 0743  PROT 6.0*  ALBUMIN 2.3*  AST 16  ALT 15  ALKPHOS 58  BILITOT 0.4   PT/INR Recent Labs    11/08/20 0743  LABPROT 15.0  INR 1.2     Studies/Results: DG Chest Port 1 View  Result Date: 11/08/2020 CLINICAL DATA:  Worsening hypoxia.  COVID positive  EXAM: PORTABLE CHEST 1 VIEW COMPARISON:  10/22/2020 FINDINGS: Progression of diffuse bilateral airspace disease. Possible edema or pneumonia. Progressive left lower lobe consolidation. Interval placement of right jugular dual lumen catheter with the tip in the right atrium. No pneumothorax. IMPRESSION: Progression of diffuse bilateral airspace disease. This is symmetric and may represent edema versus pneumonia. Progressive left lower lobe consolidation. Electronically Signed   By: Franchot Gallo M.D.   On: 11/08/2020 08:45   DG Ankle Left Port  Result Date: 11/08/2020 CLINICAL DATA:  Right ankle pain EXAM: PORTABLE LEFT ANKLE - 2 VIEW COMPARISON:  None. FINDINGS: There is no evidence of fracture, dislocation, or joint effusion. There is no evidence of arthropathy or other focal bone abnormality. Diffuse arterial calcification. IMPRESSION: Negative. Electronically Signed   By: Franchot Gallo M.D.   On: 11/08/2020 08:46   DG Ankle Right Port  Result Date: 11/08/2020 CLINICAL DATA:  Multiple falls EXAM: PORTABLE RIGHT ANKLE - 2 VIEW COMPARISON:  None. FINDINGS: Nondisplaced fracture distal fibula. No fracture of the tibia. Ankle mortise intact. Arterial calcification IMPRESSION: Nondisplaced fracture distal fibula. Electronically Signed   By: Franchot Gallo M.D.   On: 11/08/2020 08:47    Impression: Suspected upper GI bleeding: Severe anemia and melenic stools.  Hemoglobin 4.0, decreased from 7.1 one week ago.  Normal INR.  PUD versus gastritis versus ischemic changes related to COVID-19 hypoxia.  Recent COVID-19 PNA, on 4L O2  AKI on CKD: BUN 67/ Cr 2.90  Plan: Continue Protonix drip.  EGD today with Dr. Michail Sermon.  I thoroughly discussed the procedure with the patient to include nature, alternatives, benefits, and risks (including but not limited to bleeding, infection, perforation, and anesthesia/cardiac and pulmonary complications, which are increased in the setting of recent Covid pneumonia with  hypoxia).  Patient verbalized understanding and gave verbal consent to proceed with EGD.  Patient will require at least 2u pRBCs prior to EGD.  Continue to monitor H&H with transfusion as needed to maintain hemoglobin greater than 7.  [facility] will follow.   LOS: 0 days   Salley Slaughter  PA-C 11/08/2020, 10:09 AM  Contact #  [phone]

## 2020-11-08 NOTE — ED Provider Notes (Signed)
[facility] DEPT Provider Note   CSN: [id number] Arrival date & time: 11/08/20  0645     History Chief Complaint  Patient presents with  . Fall  . Weakness    Latasha [name] is a 65 y.o. female.  Presents to ER with myriad complaints.  Patient recently discharged from hospital for Covid pneumonia, acute on chronic kidney disease necessitating dialysis.  Since discharge patient states she has felt very weak, couple days ago started having multiple dark stools.  No vomiting.  Denies prior history of GI bleed.  Not on blood thinners.  She also states that today she felt weak and twisted her ankles and fell.  Denies any trauma.  Having pain in both of her ankles, no numbness or tingling.  Also feels more short of breath than normal.  No associated chest pain.  Was not discharged on oxygen.  HPI     Past Medical History:  Diagnosis Date  . BPPV (benign paroxysmal positional vertigo)   . Chronic congestive heart failure with left ventricular diastolic dysfunction ([place])   . CKD (chronic kidney disease), stage IV ([place]) 05/15/2017  . Diabetes mellitus   . Hyperlipidemia   . Hypertension   . Left atrial enlargement   . Morbid obesity with BMI of 40.0-44.9, adult ([place])   . Palpitations     Patient Active Problem List   Diagnosis Date Noted  . GIB (gastrointestinal bleeding) 11/08/2020  . Pneumonia due to COVID-19 virus 10/22/2020  . Acute on chronic respiratory failure with hypoxia ([place]) 10/22/2020  . Cardiorenal syndrome 03/05/2020  . Chest pain, rule out acute myocardial infarction 03/07/2018  . CKD (chronic kidney disease) stage 4, GFR 15-29 ml/min (HCC) 03/07/2018  . Hyperphosphatemia 06/09/2017  . Elevated ALT measurement 06/08/2017  . Hypertensive heart disease with acute on chronic diastolic congestive heart failure ([place])   . Bilateral leg edema   . Chronic diastolic CHF (congestive heart failure) ([place]) 06/07/2017  . Acute on chronic diastolic  CHF (congestive heart failure) ([place]) 06/07/2017  . Acute respiratory failure with hypoxia ([place]) 06/07/2017  . ARF (acute renal failure) ([place])   . Encephalopathy acute 05/15/2017  . Hyperosmolar non-ketotic state in patient with type 2 diabetes mellitus ([place]) 05/15/2017  . Chronic pain 05/15/2017  . Acute kidney injury superimposed on chronic kidney disease ([place])   . Hyperlipemia 01/19/2014  . Palpitations 01/19/2014  . PVC's (premature ventricular contractions) 01/19/2014  . SCIATICA, RIGHT 12/12/2010  . GASTROESOPHAGEAL REFLUX DISEASE 02/09/2010  . LEG CRAMPS 02/09/2010  . Morbid obesity ([place]) 12/01/2009  . VITAMIN D DEFICIENCY 10/13/2009  . CONSTIPATION 10/13/2009  . LIVER FUNCTION TESTS, ABNORMAL, HX OF 10/13/2009  . Essential hypertension 08/24/2009  . SHOULDER PAIN, RIGHT, CHRONIC 08/24/2009  . Insulin-requiring or dependent type II diabetes mellitus ([place]) 10/08/1991    Past Surgical History:  Procedure Laterality Date  . ABDOMINAL HYSTERECTOMY    . EYE SURGERY     cataract/left eye, right eye had bleeding in back  . IR FLUORO GUIDE CV LINE RIGHT  10/26/2020  . IR US GUIDE VASC ACCESS RIGHT  10/26/2020  . KNEE SURGERY    . LEFT HEART CATH AND CORONARY ANGIOGRAPHY N/A 03/09/2018   Procedure: LEFT HEART CATH AND CORONARY ANGIOGRAPHY;  Surgeon: Lorretta Harp, MD;  Location: [facility];  Service: Cardiovascular;  Laterality: N/A;  . TRIGGER FINGER RELEASE       OB History   No obstetric history on file.  Family History  Problem Relation Age of Onset  . Heart disease Father   . Hypertension Father   . Alzheimer's disease Mother   . Diabetes Mother   . Diabetes Sister   . Dementia Other     Social History   Tobacco Use  . Smoking status: Never Smoker  . Smokeless tobacco: Never Used  Substance Use Topics  . Alcohol use: Yes    Comment:  rare  . Drug use: No    Home Medications Prior to Admission medications   Medication Sig Start Date End Date  Taking? Authorizing Provider  acetaminophen (TYLENOL) 500 MG tablet Take 500 mg by mouth every 6 (six) hours as needed for mild pain, fever or headache.   Yes [provider]  albuterol (PROVENTIL HFA;VENTOLIN HFA) 108 (90 Base) MCG/ACT inhaler Inhale 2 puffs into the lungs every 6 (six) hours as needed for wheezing or shortness of breath.   Yes [provider]  amLODipine (NORVASC) 10 MG tablet Take 1 tablet (10 mg total) by mouth daily. 05/18/17  Yes Eugenie Filler, MD  atorvastatin (LIPITOR) 40 MG tablet Take 40 mg by mouth daily.    Yes [provider]  carvedilol (COREG) 25 MG tablet Take 1 tablet (25 mg total) by mouth 2 (two) times daily with a meal. 03/10/18  Yes Florencia Reasons, MD  furosemide (LASIX) 80 MG tablet Take 1 tablet (80 mg total) by mouth daily. 11/02/20 12/02/20 Yes Donne Hazel, MD  insulin aspart protamine- aspart (NOVOLOG MIX 70/30) (70-30) 100 UNIT/ML injection Inject 50-60 Units into the skin See admin instructions. Use 60 units every morning and use 50 units every evening   Yes [provider]  nitroGLYCERIN (NITROSTAT) 0.4 MG SL tablet Place 1 tablet (0.4 mg total) under the tongue every 5 (five) minutes as needed for chest pain. 03/10/18  Yes Florencia Reasons, MD    Allergies    Hydrocodone-acetaminophen and Oxycodone hcl  Review of Systems   Review of Systems  Constitutional: Positive for chills and fatigue. Negative for fever.  HENT: Negative for ear pain and sore throat.   Eyes: Negative for pain and visual disturbance.  Respiratory: Positive for shortness of breath. Negative for cough.   Cardiovascular: Negative for chest pain and palpitations.  Gastrointestinal: Negative for abdominal pain and vomiting.  Genitourinary: Negative for dysuria and hematuria.  Musculoskeletal: Positive for arthralgias. Negative for back pain.  Skin: Negative for color change and rash.  Neurological: Positive for light-headedness. Negative for seizures and  syncope.  All other systems reviewed and are negative.   Physical Exam Updated Vital Signs BP (!) 121/48   Pulse 74   Temp 98.5 F (36.9 C) (Oral)   Resp 15   SpO2 100%   Physical Exam Vitals and nursing note reviewed.  Constitutional:      General: She is not in acute distress.    Appearance: She is well-developed and well-nourished.  HENT:     Head: Normocephalic and atraumatic.  Eyes:     Pupils: Pupils are equal, round, and reactive to light.     Comments: Very pale conjunctiva  Cardiovascular:     Rate and Rhythm: Normal rate and regular rhythm.     Heart sounds: No murmur heard.   Pulmonary:     Comments: Some tachypnea, diminished at bases, no distress Abdominal:     General: There is no distension.     Palpations: Abdomen is soft. There is no mass.  Comments: Soft but some tenderness in left upper and left lower quadrants  Genitourinary:    Comments: Dark, black stool with tinge of red Musculoskeletal:        General: No edema.     Cervical back: Neck supple.     Comments: Mild swelling to bilateral ankles, some tenderness to both ankles, sensation, pulses intact  Skin:    General: Skin is warm and dry.     Capillary Refill: Capillary refill takes less than 2 seconds.     Coloration: Skin is pale.  Neurological:     General: No focal deficit present.     Mental Status: She is alert.  Psychiatric:        Mood and Affect: Mood and affect and mood normal.        Behavior: Behavior normal.     ED Results / Procedures / Treatments   Labs (all labs ordered are listed, but only abnormal results are displayed) Labs Reviewed  CBC WITH DIFFERENTIAL/PLATELET - Abnormal; Notable for the following components:      Result Value   RBC 1.41 (*)    Hemoglobin 4.0 (*)    HCT 13.7 (*)    MCHC 29.2 (*)    RDW 16.1 (*)    nRBC 1.9 (*)    Abs Immature Granulocytes 0.14 (*)    All other components within normal limits  COMPREHENSIVE METABOLIC PANEL - Abnormal;  Notable for the following components:   Glucose, Bld 167 (*)    BUN 67 (*)    Creatinine, Ser 2.90 (*)    Calcium 7.4 (*)    Total Protein 6.0 (*)    Albumin 2.3 (*)    GFR, Estimated 18 (*)    All other components within normal limits  D-DIMER, QUANTITATIVE (NOT AT [facility]) - Abnormal; Notable for the following components:   D-Dimer, Quant 2.31 (*)    All other components within normal limits  LACTATE DEHYDROGENASE - Abnormal; Notable for the following components:   LDH 329 (*)    All other components within normal limits  FERRITIN - Abnormal; Notable for the following components:   Ferritin 621 (*)    All other components within normal limits  TRIGLYCERIDES - Abnormal; Notable for the following components:   Triglycerides 171 (*)    All other components within normal limits  C-REACTIVE PROTEIN - Abnormal; Notable for the following components:   CRP 6.5 (*)    All other components within normal limits  BRAIN NATRIURETIC PEPTIDE - Abnormal; Notable for the following components:   B Natriuretic Peptide 133.4 (*)    All other components within normal limits  OCCULT BLOOD X 1 CARD TO LAB, STOOL - Abnormal; Notable for the following components:   Fecal Occult Bld POSITIVE (*)    All other components within normal limits  PROCALCITONIN  FIBRINOGEN  PROTIME-INR  URINALYSIS, ROUTINE W REFLEX MICROSCOPIC  TYPE AND SCREEN  PREPARE RBC (CROSSMATCH)  ABO/RH  TROPONIN I (HIGH SENSITIVITY)    EKG EKG Interpretation  Date/Time:  Wednesday November 08 2020 07:21:24 EST Ventricular Rate:  80 PR Interval:    QRS Duration: 97 QT Interval:  404 QTC Calculation: 466 R Axis:   -14 Text Interpretation: Sinus rhythm Low voltage, precordial leads Probable LVH with secondary repol abnrm Confirmed by Madalyn Rob [id number]) on 11/08/2020 8:09:41 AM   Radiology DG Chest Port 1 View  Result Date: 11/08/2020 CLINICAL DATA:  Worsening hypoxia.  COVID positive EXAM: PORTABLE CHEST 1 VIEW  COMPARISON:  10/22/2020 FINDINGS: Progression of diffuse bilateral airspace disease. Possible edema or pneumonia. Progressive left lower lobe consolidation. Interval placement of right jugular dual lumen catheter with the tip in the right atrium. No pneumothorax. IMPRESSION: Progression of diffuse bilateral airspace disease. This is symmetric and may represent edema versus pneumonia. Progressive left lower lobe consolidation. Electronically Signed   By: Franchot Gallo M.D.   On: 11/08/2020 08:45   DG Ankle Left Port  Result Date: 11/08/2020 CLINICAL DATA:  Right ankle pain EXAM: PORTABLE LEFT ANKLE - 2 VIEW COMPARISON:  None. FINDINGS: There is no evidence of fracture, dislocation, or joint effusion. There is no evidence of arthropathy or other focal bone abnormality. Diffuse arterial calcification. IMPRESSION: Negative. Electronically Signed   By: Franchot Gallo M.D.   On: 11/08/2020 08:46   DG Ankle Right Port  Result Date: 11/08/2020 CLINICAL DATA:  Multiple falls EXAM: PORTABLE RIGHT ANKLE - 2 VIEW COMPARISON:  None. FINDINGS: Nondisplaced fracture distal fibula. No fracture of the tibia. Ankle mortise intact. Arterial calcification IMPRESSION: Nondisplaced fracture distal fibula. Electronically Signed   By: Franchot Gallo M.D.   On: 11/08/2020 08:47    Procedures .Critical Care Performed by: Lucrezia Starch, MD Authorized by: Lucrezia Starch, MD   Critical care provider statement:    Critical care time (minutes):  45   Critical care was necessary to treat or prevent imminent or life-threatening deterioration of the following conditions:  Cardiac failure   Critical care was time spent personally by me on the following activities:  Discussions with consultants, evaluation of patient's response to treatment, examination of patient, ordering and performing treatments and interventions, ordering and review of laboratory studies, ordering and review of radiographic studies, pulse oximetry,  re-evaluation of patient's condition, obtaining history from patient or surrogate and review of old charts      Medications Ordered in ED Medications  0.9 %  sodium chloride infusion (Manually program via Guardrails IV Fluids) (has no administration in time range)  pantoprazole (PROTONIX) 80 mg in sodium chloride 0.9 % 100 mL (0.8 mg/mL) infusion (8 mg/hr Intravenous New Bag/Given 11/08/20 0947)  pantoprazole (PROTONIX) injection 40 mg (has no administration in time range)  fentaNYL (SUBLIMAZE) injection 50 mcg (50 mcg Intravenous Given 11/08/20 0817)  ondansetron (ZOFRAN) injection 4 mg (4 mg Intravenous Given 11/08/20 0816)  pantoprazole (PROTONIX) 80 mg in sodium chloride 0.9 % 100 mL IVPB (0 mg Intravenous Stopped 11/08/20 0947)    ED Course  I have reviewed the triage vital signs and the nursing notes.  Pertinent labs & imaging results that were available during my care of the patient were reviewed by me and considered in my medical decision making (see chart for details).    MDM Rules/Calculators/A&P                         65 year old lady presenting to ER with concern for increased shortness of breath, generalized weakness, dark stools as well as fall and ankle pain.  Recent admission for Covid pneumonia, hypoxic respiratory failure, acute on chronic kidney disease requiring dialysis.  Regarding fall, ankle pain plain films were concerning for right nondisplaced distal fibula fracture.  Reviewed with Dr. Lorin Mercy on-call for orthopedics.  He recommends toe-touch weightbearing with walker, cam boot for now.  Follow-up outpatient in his clinic. Ordered CAM walking boot.   Regarding increased shortness of breath, she was hypoxic with EMS, currently on 4 L nasal cannula.  CXR with  worsening Covid pneumonia.  Regarding generalized weakness and dark stools, her hemoglobin dropped from 7-4, on rectal exam she has black stools, concern for GI bleed.  Started Protonix drip, transfuse 2 units for now,  consult to GI.  Michail Sermon will evaluate, likely scope later today.  Patient hemodynamically is stable at present.  Requiring 4 L oxygen support.  Will ask hospitalist to admit.  Final Clinical Impression(s) / ED Diagnoses Final diagnoses:  Ankle pain, left  Gastrointestinal hemorrhage, unspecified gastrointestinal hemorrhage type  Acute blood loss anemia  COVID-19  Closed fracture of distal end of right fibula, unspecified fracture morphology, initial encounter    Rx / DC Orders ED Discharge Orders    None       Lucrezia Starch, MD 11/08/20 1011

## 2020-11-08 NOTE — H&P (Addendum)
History and Physical    Latasha [name] [id number] DOB: 10/15/1955 DOA: 11/08/2020  PCP: Shirline Frees, MD  Patient coming from: Home  Chief Complaint: Dark stools and falls  HPI: Latasha [name] is a 65 y.o. female with medical history significant of HFpEF, CKD IV, DM2, HTN, morbid obesity. Presenting with dark stools.  She reports her symptoms began about 2 days ago with falls. She did not trip over anything. She had no head injury or LOC. She just generally felt weak and would go down. She notes that also 2 days ago, she started having heavy dark stools and loose bowels. She has had no sick contacts. She hasn't tried any meds. She called her PCP yesterday for a televisit, but was unable to secure one. She fell again last night and had ankle pain to the point that she could not get up. Her family decided she needed to come to the ED.   ED Course: Hgb noted to be 4.0. FOBT positive [facility] was consulted. Ankle XR noted to be positive for right ankle Fx. Ortho consulted. TRH called for admission.   Review of Systems:  Denies CP, palpitations, dyspnea, fevers, vomiting. She reports N, weakness, dark stools. Review of systems is otherwise negative for all not mentioned in HPI.   PMHx Past Medical History:  Diagnosis Date  . BPPV (benign paroxysmal positional vertigo)   . Chronic congestive heart failure with left ventricular diastolic dysfunction ([place])   . CKD (chronic kidney disease), stage IV ([place]) 05/15/2017  . Diabetes mellitus   . Hyperlipidemia   . Hypertension   . Left atrial enlargement   . Morbid obesity with BMI of 40.0-44.9, adult ([place])   . Palpitations     PSHx Past Surgical History:  Procedure Laterality Date  . ABDOMINAL HYSTERECTOMY    . EYE SURGERY     cataract/left eye, right eye had bleeding in back  . IR FLUORO GUIDE CV LINE RIGHT  10/26/2020  . IR US GUIDE VASC ACCESS RIGHT  10/26/2020  . KNEE SURGERY    . LEFT HEART CATH AND CORONARY ANGIOGRAPHY N/A 03/09/2018    Procedure: LEFT HEART CATH AND CORONARY ANGIOGRAPHY;  Surgeon: Lorretta Harp, MD;  Location: [facility];  Service: Cardiovascular;  Laterality: N/A;  . TRIGGER FINGER RELEASE      SocHx  reports that she has never smoked. She has never used smokeless tobacco. She reports current alcohol use. She reports that she does not use drugs.  Allergies  Allergen Reactions  . Hydrocodone-Acetaminophen Other (See Comments)    Unknown reaction  . Oxycodone Hcl Other (See Comments)    Unknown reaction    FamHx Family History  Problem Relation Age of Onset  . Heart disease Father   . Hypertension Father   . Alzheimer's disease Mother   . Diabetes Mother   . Diabetes Sister   . Dementia Other     Prior to Admission medications   Medication Sig Start Date End Date Taking? Authorizing Provider  acetaminophen (TYLENOL) 500 MG tablet Take 500 mg by mouth every 6 (six) hours as needed for mild pain, fever or headache.   Yes [provider]  albuterol (PROVENTIL HFA;VENTOLIN HFA) 108 (90 Base) MCG/ACT inhaler Inhale 2 puffs into the lungs every 6 (six) hours as needed for wheezing or shortness of breath.   Yes [provider]  amLODipine (NORVASC) 10 MG tablet Take 1 tablet (10 mg total) by mouth daily. 05/18/17  Yes  Eugenie Filler, MD  atorvastatin (LIPITOR) 40 MG tablet Take 40 mg by mouth daily.    Yes [provider]  carvedilol (COREG) 25 MG tablet Take 1 tablet (25 mg total) by mouth 2 (two) times daily with a meal. 03/10/18  Yes Florencia Reasons, MD  furosemide (LASIX) 80 MG tablet Take 1 tablet (80 mg total) by mouth daily. 11/02/20 12/02/20 Yes Donne Hazel, MD  insulin aspart protamine- aspart (NOVOLOG MIX 70/30) (70-30) 100 UNIT/ML injection Inject 50-60 Units into the skin See admin instructions. Use 60 units every morning and use 50 units every evening   Yes [provider]  nitroGLYCERIN (NITROSTAT) 0.4 MG SL tablet Place 1 tablet (0.4 mg total)  under the tongue every 5 (five) minutes as needed for chest pain. 03/10/18  Yes Florencia Reasons, MD    Physical Exam: Vitals:   11/08/20 0700 11/08/20 0745 11/08/20 0810 11/08/20 0845  BP: (!) 118/48 (!) 139/56 (!) 136/50 (!) 138/55  Pulse: 77 78 79 73  Resp: 19 (!) 24 (!) 24 19  Temp: 98.5 F (36.9 C)     TempSrc: Oral     SpO2: 100% 100% 100% 97%    General: 65 y.o. female resting in bed in NAD Eyes: PERRL, normal sclera ENMT: Nares patent w/o discharge, orophaynx clear, dentition normal, ears w/o discharge/lesions/ulcers Neck: Supple, trachea midline Cardiovascular: RRR, +S1, S2, no m/g/r, equal pulses throughout Respiratory: CTABL, no w/r/r, normal WOB GI: BS+, ND, epigastric TTP mild, no masses noted, no organomegaly noted MSK: No c/c; 1+ BLE edema, right ankle TTP Skin: No rashes, bruises, ulcerations noted Neuro: A&O x 3, no focal deficits Psyc: Appropriate interaction and affect, calm/cooperative  Labs on Admission: I have personally reviewed following labs and imaging studies  CBC: Recent Labs  Lab 11/08/20 0743  WBC 8.6  NEUTROABS 6.4  HGB 4.0*  HCT 13.7*  MCV 97.2  PLT [id number]   Basic Metabolic Panel: Recent Labs  Lab 11/08/20 0743  NA 139  K 4.6  CL 101  CO2 26  GLUCOSE 167*  BUN 67*  CREATININE 2.90*  CALCIUM 7.4*   GFR: Estimated Creatinine Clearance: 25.6 mL/min (A) (by C-G formula based on SCr of 2.9 mg/dL (H)). Liver Function Tests: Recent Labs  Lab 11/08/20 0743  AST 16  ALT 15  ALKPHOS 58  BILITOT 0.4  PROT 6.0*  ALBUMIN 2.3*   No results for input(s): LIPASE, AMYLASE in the last 168 hours. No results for input(s): AMMONIA in the last 168 hours. Coagulation Profile: Recent Labs  Lab 11/08/20 0743  INR 1.2   Cardiac Enzymes: No results for input(s): CKTOTAL, CKMB, CKMBINDEX, TROPONINI in the last 168 hours. BNP (last 3 results) No results for input(s): PROBNP in the last 8760 hours. HbA1C: No results for input(s): HGBA1C in the last  72 hours. CBG: Recent Labs  Lab 11/01/20 1412  GLUCAP 169*   Lipid Profile: Recent Labs    11/08/20 0743  TRIG 171*   Thyroid Function Tests: No results for input(s): TSH, T4TOTAL, FREET4, T3FREE, THYROIDAB in the last 72 hours. Anemia Panel: Recent Labs    11/08/20 0743  FERRITIN 621*   Urine analysis:    Component Value Date/Time   COLORURINE YELLOW 10/22/2020 1007   APPEARANCEUR CLOUDY (A) 10/22/2020 1007   LABSPEC 1.012 10/22/2020 1007   PHURINE 5.0 10/22/2020 1007   GLUCOSEU NEGATIVE 10/22/2020 1007   HGBUR SMALL (A) 10/22/2020 1007   BILIRUBINUR NEGATIVE 10/22/2020 1007   KETONESUR NEGATIVE  10/22/2020 1007   PROTEINUR 100 (A) 10/22/2020 1007   UROBILINOGEN 0.2 02/14/2012 1639   NITRITE NEGATIVE 10/22/2020 1007   LEUKOCYTESUR NEGATIVE 10/22/2020 1007    Radiological Exams on Admission: DG Chest Port 1 View  Result Date: 11/08/2020 CLINICAL DATA:  Worsening hypoxia.  COVID positive EXAM: PORTABLE CHEST 1 VIEW COMPARISON:  10/22/2020 FINDINGS: Progression of diffuse bilateral airspace disease. Possible edema or pneumonia. Progressive left lower lobe consolidation. Interval placement of right jugular dual lumen catheter with the tip in the right atrium. No pneumothorax. IMPRESSION: Progression of diffuse bilateral airspace disease. This is symmetric and may represent edema versus pneumonia. Progressive left lower lobe consolidation. Electronically Signed   By: Franchot Gallo M.D.   On: 11/08/2020 08:45   DG Ankle Left Port  Result Date: 11/08/2020 CLINICAL DATA:  Right ankle pain EXAM: PORTABLE LEFT ANKLE - 2 VIEW COMPARISON:  None. FINDINGS: There is no evidence of fracture, dislocation, or joint effusion. There is no evidence of arthropathy or other focal bone abnormality. Diffuse arterial calcification. IMPRESSION: Negative. Electronically Signed   By: Franchot Gallo M.D.   On: 11/08/2020 08:46   DG Ankle Right Port  Result Date: 11/08/2020 CLINICAL DATA:  Multiple  falls EXAM: PORTABLE RIGHT ANKLE - 2 VIEW COMPARISON:  None. FINDINGS: Nondisplaced fracture distal fibula. No fracture of the tibia. Ankle mortise intact. Arterial calcification IMPRESSION: Nondisplaced fracture distal fibula. Electronically Signed   By: Franchot Gallo M.D.   On: 11/08/2020 08:47    EKG: Independently reviewed. NSR, no st changes  Assessment/Plan GIB Symptomatic anemia Ab pain     - admit to inpatient, tele     - Hgb down to 4.0 from 7.1 on 11/01/20; several episodes of dark stools during that time; baseline looks like it's 9 - 10     - protonix IV, NPO for now, [facility] consulted by EDP, awaiting final recs     - 2 units pRBCs ordered; q6h H&H     - CT ab/pelvis ordered  Falls Non-displaced right fibula Fx     - XR right ankle: Nondisplaced fracture distal fibula.     - multiple falls w/o LOC/head injury likely d/t GIB     - ortho consulted by EDP, rec: Touch toe weight bearing with walker, CAM walker boot. Follow up outpt with ortho in 2 weeks     - will have PT look at her while she is here  Recent COVID 19 infection     - COVID positive 10/22/20; completed therapy during last admission     - respiratory status is good     - complete 21-day isolation from 1st positive (End PM 11/11/20)  DM2     - SSI, DM diet when she comes off NPO, glucose checks  Chronic HFpEF     - continue home lasix (use IV for now)  CKD S4     - continue home lasix, watch other nephrotoxins  Hypocalcemia     - mild, follow, check Vit D and phos  Elevated d-dimer in setting of recent COVID infection     - no respiratory symptoms, tachycardia, or CP     - BP looks ok     - renal function not appropriate for CTA chest     - check BLE dopplers; if clot there, will need to talk with pt/IR about filter, given GIB.   DVT prophylaxis: SCDs  Code Status: FULL  Family Communication: Spoke with spouse Latasha [name] [phone]) by phone.  Consults called: EDP spoke with [facility] (Dr.  Michail Sermon).    Status is: Inpatient  Remains inpatient appropriate because:Inpatient level of care appropriate due to severity of illness   Dispo: The patient is from: Home              Anticipated d/c is to: Home              Anticipated d/c date is: 2 days              Patient currently is not medically stable to d/c.   Difficult to place patient No  Time spent coordinating admission: 70 minutes  [facility]  If 7PM-7AM, please contact night-coverage [email or web address]  11/08/2020, 9:32 AM

## 2020-11-08 NOTE — Plan of Care (Signed)

## 2020-11-08 NOTE — ED Triage Notes (Addendum)
Pt presents via [facility] from home, called out for assistance after a fall this morning as well as multiple falls over the last 2 days. Pt c/o R ankle pain. pt reports bilateral leg weakness getting worse over the last couple of days, EMS also states pt was 75% on RA and placed on 4L [place] and improved to 96%. recently dc'd from hospital after being admitted with covid

## 2020-11-08 NOTE — ED Notes (Signed)
X-ray at bedside

## 2020-11-08 NOTE — ED Notes (Signed)
Called to give report to [place], Therapist, sports. This RN was told that receiving RN is in a room, will call back.

## 2020-11-08 NOTE — ED Notes (Signed)
Pt also c/o abd pain with an episode of dark stools this morning

## 2020-11-08 NOTE — Progress Notes (Signed)
Orthopedic Tech Progress Note Patient Details:  Latasha [name] 1956/07/29 [name]  Ortho Devices Type of Ortho Device: CAM walker Ortho Device/Splint Location: right Ortho Device/Splint Interventions: Application   Post Interventions Patient Tolerated: Well Instructions Provided: Care of device   [place] Pink 11/08/2020, 11:14 AM

## 2020-11-08 NOTE — ED Notes (Signed)
Per Domingo Dimes, Charge RN this RN will wait until all units of blood (pt is on second unit at this time and 4 units are ordered) before obtaining a repeat hemoglobin and hematocrit lab draw.

## 2020-11-08 NOTE — ED Notes (Signed)
Sent duplicate pink top type and screen as per lab's request.

## 2020-11-08 NOTE — ED Notes (Signed)
Attempted IV x 2 w/o success. Labs drawn. Additional RN at bedside with [place] machine for IV attempt.

## 2020-11-08 NOTE — H&P (View-Only) (Signed)
Patient received 1u pRBCs with post-transfusion Hgb of 4.9.  Per anesthesia, Hgb must be >6.8 in order to proceed with EGD.  Placed order for 2 additional units of pRBCs.  EGD may be postponed until tomorrow, given need for additional blood transfusions.  Patient has stable BP and HR at this time.  However, if patient destabilizes, she would require emergent EGD.  [facility] will follow.  Salley Slaughter, [place] [phone]

## 2020-11-08 NOTE — ED Notes (Signed)
MD aware of hgb.

## 2020-11-08 NOTE — Progress Notes (Signed)
Orthopedic Tech Progress Note Patient Details:  Latasha [name] 20-Dec-1955 [id number]  Ortho Devices Type of Ortho Device: CAM walker Ortho Device/Splint Location: right Ortho Device/Splint Interventions: Application   Post Interventions Patient Tolerated: Well Instructions Provided: Care of device   [place] Pink 11/08/2020, 11:13 AM

## 2020-11-08 NOTE — ED Notes (Signed)
Pt currently receiving second unit of blood.

## 2020-11-09 ENCOUNTER — Inpatient Hospital Stay (HOSPITAL_COMMUNITY): Payer: Medicare HMO | Admitting: Certified Registered Nurse Anesthetist

## 2020-11-09 ENCOUNTER — Other Ambulatory Visit: Payer: Self-pay

## 2020-11-09 ENCOUNTER — Encounter (HOSPITAL_COMMUNITY)
Admission: EM | Disposition: A | Discharge status: Home / Self Care | Payer: Self-pay | Source: Home / Self Care | Attending: Internal Medicine

## 2020-11-09 ENCOUNTER — Encounter (HOSPITAL_COMMUNITY): Payer: Self-pay | Admitting: Internal Medicine

## 2020-11-09 DIAGNOSIS — J9601 Acute respiratory failure with hypoxia: Secondary | ICD-10-CM | POA: Diagnosis not present

## 2020-11-09 DIAGNOSIS — K922 Gastrointestinal hemorrhage, unspecified: Secondary | ICD-10-CM

## 2020-11-09 DIAGNOSIS — I10 Essential (primary) hypertension: Secondary | ICD-10-CM

## 2020-11-09 DIAGNOSIS — K219 Gastro-esophageal reflux disease without esophagitis: Secondary | ICD-10-CM

## 2020-11-09 DIAGNOSIS — I5032 Chronic diastolic (congestive) heart failure: Secondary | ICD-10-CM

## 2020-11-09 DIAGNOSIS — N184 Chronic kidney disease, stage 4 (severe): Secondary | ICD-10-CM

## 2020-11-09 DIAGNOSIS — E782 Mixed hyperlipidemia: Secondary | ICD-10-CM

## 2020-11-09 DIAGNOSIS — Z794 Long term (current) use of insulin: Secondary | ICD-10-CM

## 2020-11-09 DIAGNOSIS — S82831A Other fracture of upper and lower end of right fibula, initial encounter for closed fracture: Secondary | ICD-10-CM

## 2020-11-09 DIAGNOSIS — E119 Type 2 diabetes mellitus without complications: Secondary | ICD-10-CM

## 2020-11-09 DIAGNOSIS — S82409A Unspecified fracture of shaft of unspecified fibula, initial encounter for closed fracture: Secondary | ICD-10-CM | POA: Diagnosis present

## 2020-11-09 HISTORY — PX: BIOPSY: SHX5522

## 2020-11-09 HISTORY — PX: ESOPHAGOGASTRODUODENOSCOPY: SHX5428

## 2020-11-09 LAB — CBC WITH DIFFERENTIAL/PLATELET
Abs Immature Granulocytes: 0.14 10*3/uL — ABNORMAL HIGH (ref 0.00–0.07)
Abs Immature Granulocytes: 0.15 10*3/uL — ABNORMAL HIGH (ref 0.00–0.07)
Basophils Absolute: 0 10*3/uL (ref 0.0–0.1)
Basophils Absolute: 0 10*3/uL (ref 0.0–0.1)
Basophils Relative: 1 %
Basophils Relative: 0 %
Eosinophils Absolute: 0.3 10*3/uL (ref 0.0–0.5)
Eosinophils Absolute: 0.3 10*3/uL (ref 0.0–0.5)
Eosinophils Relative: 4 %
Eosinophils Relative: 4 %
HCT: 21.5 % — ABNORMAL LOW (ref 36.0–46.0)
HCT: 22.2 % — ABNORMAL LOW (ref 36.0–46.0)
Hemoglobin: 6.9 g/dL — CL (ref 12.0–15.0)
Hemoglobin: 7.3 g/dL — ABNORMAL LOW (ref 12.0–15.0)
Immature Granulocytes: 2 %
Immature Granulocytes: 2 %
Lymphocytes Relative: 19 %
Lymphocytes Relative: 19 %
Lymphs Abs: 1.5 10*3/uL (ref 0.7–4.0)
Lymphs Abs: 1.4 10*3/uL (ref 0.7–4.0)
MCH: 30.1 pg (ref 26.0–34.0)
MCH: 30.4 pg (ref 26.0–34.0)
MCHC: 32.1 g/dL (ref 30.0–36.0)
MCHC: 32.9 g/dL (ref 30.0–36.0)
MCV: 93.9 fL (ref 80.0–100.0)
MCV: 92.5 fL (ref 80.0–100.0)
Monocytes Absolute: 0.7 10*3/uL (ref 0.1–1.0)
Monocytes Absolute: 0.6 10*3/uL (ref 0.1–1.0)
Monocytes Relative: 8 %
Monocytes Relative: 8 %
Neutro Abs: 5.3 10*3/uL (ref 1.7–7.7)
Neutro Abs: 4.8 10*3/uL (ref 1.7–7.7)
Neutrophils Relative %: 66 %
Neutrophils Relative %: 67 %
Platelets: 198 10*3/uL (ref 150–400)
Platelets: 185 10*3/uL (ref 150–400)
RBC: 2.29 MIL/uL — ABNORMAL LOW (ref 3.87–5.11)
RBC: 2.4 MIL/uL — ABNORMAL LOW (ref 3.87–5.11)
RDW: 16.7 % — ABNORMAL HIGH (ref 11.5–15.5)
RDW: 15.9 % — ABNORMAL HIGH (ref 11.5–15.5)
WBC: 7.9 10*3/uL (ref 4.0–10.5)
WBC: 7.2 10*3/uL (ref 4.0–10.5)
nRBC: 1.5 % — ABNORMAL HIGH (ref 0.0–0.2)
nRBC: 1.4 % — ABNORMAL HIGH (ref 0.0–0.2)

## 2020-11-09 LAB — HEMOGLOBIN AND HEMATOCRIT, BLOOD
HCT: 24.6 % — ABNORMAL LOW (ref 36.0–46.0)
HCT: 22.2 % — ABNORMAL LOW (ref 36.0–46.0)
Hemoglobin: 8.1 g/dL — ABNORMAL LOW (ref 12.0–15.0)
Hemoglobin: 7.2 g/dL — ABNORMAL LOW (ref 12.0–15.0)

## 2020-11-09 LAB — COMPREHENSIVE METABOLIC PANEL WITH GFR
ALT: 14 U/L (ref 0–44)
AST: 12 U/L — ABNORMAL LOW (ref 15–41)
Albumin: 2.3 g/dL — ABNORMAL LOW (ref 3.5–5.0)
Alkaline Phosphatase: 50 U/L (ref 38–126)
Anion gap: 14 (ref 5–15)
BUN: 76 mg/dL — ABNORMAL HIGH (ref 8–23)
CO2: 24 mmol/L (ref 22–32)
Calcium: 6.9 mg/dL — ABNORMAL LOW (ref 8.9–10.3)
Chloride: 103 mmol/L (ref 98–111)
Creatinine, Ser: 2.91 mg/dL — ABNORMAL HIGH (ref 0.44–1.00)
GFR, Estimated: 18 mL/min — ABNORMAL LOW
Glucose, Bld: 179 mg/dL — ABNORMAL HIGH (ref 70–99)
Potassium: 4.7 mmol/L (ref 3.5–5.1)
Sodium: 141 mmol/L (ref 135–145)
Total Bilirubin: 0.6 mg/dL (ref 0.3–1.2)
Total Protein: 5.1 g/dL — ABNORMAL LOW (ref 6.5–8.1)

## 2020-11-09 LAB — MAGNESIUM: Magnesium: 2 mg/dL (ref 1.7–2.4)

## 2020-11-09 LAB — GLUCOSE, CAPILLARY
Glucose-Capillary: 172 mg/dL — ABNORMAL HIGH (ref 70–99)
Glucose-Capillary: 136 mg/dL — ABNORMAL HIGH (ref 70–99)
Glucose-Capillary: 334 mg/dL — ABNORMAL HIGH (ref 70–99)
Glucose-Capillary: 366 mg/dL — ABNORMAL HIGH (ref 70–99)

## 2020-11-09 LAB — FERRITIN: Ferritin: 393 ng/mL — ABNORMAL HIGH (ref 11–307)

## 2020-11-09 LAB — PREPARE RBC (CROSSMATCH)

## 2020-11-09 LAB — D-DIMER, QUANTITATIVE: D-Dimer, Quant: 1.56 ug/mL-FEU — ABNORMAL HIGH (ref 0.00–0.50)

## 2020-11-09 LAB — PHOSPHORUS: Phosphorus: 5 mg/dL — ABNORMAL HIGH (ref 2.5–4.6)

## 2020-11-09 LAB — C-REACTIVE PROTEIN: CRP: 3.8 mg/dL — ABNORMAL HIGH (ref <1.0)

## 2020-11-09 SURGERY — EGD (ESOPHAGOGASTRODUODENOSCOPY)
Anesthesia: Monitor Anesthesia Care

## 2020-11-09 MED ORDER — LIDOCAINE 2% (20 MG/ML) 5 ML SYRINGE
INTRAMUSCULAR | Status: DC | PRN
  Administered 2020-11-09: 100 mg via INTRAVENOUS

## 2020-11-09 MED ORDER — SODIUM CHLORIDE 0.9 % IV SOLN: INTRAVENOUS | Status: DC | PRN

## 2020-11-09 MED ORDER — PROPOFOL 10 MG/ML IV BOLUS
INTRAVENOUS | Status: AC
  Filled 2020-11-09: qty 20

## 2020-11-09 MED ORDER — TRAMADOL HCL 50 MG PO TABS
50.0000 mg | ORAL_TABLET | Freq: Four times a day (QID) | ORAL | Status: DC | PRN
  Administered 2020-11-09 – 2020-11-12 (×5): 50 mg via ORAL
  Filled 2020-11-09 (×5): qty 1

## 2020-11-09 MED ORDER — SUCCINYLCHOLINE CHLORIDE 200 MG/10ML IV SOSY
PREFILLED_SYRINGE | INTRAVENOUS | Status: DC | PRN
  Administered 2020-11-09: 180 mg via INTRAVENOUS

## 2020-11-09 MED ORDER — CARVEDILOL 25 MG PO TABS
25.0000 mg | ORAL_TABLET | Freq: Two times a day (BID) | ORAL | Status: DC
  Administered 2020-11-09 – 2020-11-12 (×6): 25 mg via ORAL
  Filled 2020-11-09 (×6): qty 1

## 2020-11-09 MED ORDER — PROPOFOL 10 MG/ML IV BOLUS
INTRAVENOUS | Status: DC | PRN
  Administered 2020-11-09: 150 mg via INTRAVENOUS

## 2020-11-09 MED ORDER — ATORVASTATIN CALCIUM 40 MG PO TABS
40.0000 mg | ORAL_TABLET | Freq: Every day | ORAL | Status: DC
  Administered 2020-11-09 – 2020-11-12 (×4): 40 mg via ORAL
  Filled 2020-11-09 (×4): qty 1

## 2020-11-09 MED ORDER — FUROSEMIDE 40 MG PO TABS
80.0000 mg | ORAL_TABLET | Freq: Every day | ORAL | Status: DC
  Administered 2020-11-09 – 2020-11-12 (×4): 80 mg via ORAL
  Filled 2020-11-09 (×4): qty 2

## 2020-11-09 MED ORDER — OXYCODONE HCL 5 MG PO TABS: 5.0000 mg | ORAL_TABLET | Freq: Four times a day (QID) | ORAL | Status: DC | PRN

## 2020-11-09 MED ORDER — DEXAMETHASONE SODIUM PHOSPHATE 10 MG/ML IJ SOLN
INTRAMUSCULAR | Status: DC | PRN
  Administered 2020-11-09: 5 mg via INTRAVENOUS

## 2020-11-09 MED ORDER — SODIUM CHLORIDE 0.9% IV SOLUTION: Freq: Once | INTRAVENOUS | Status: AC

## 2020-11-09 MED ORDER — FENTANYL CITRATE (PF) 100 MCG/2ML IJ SOLN
12.5000 ug | INTRAMUSCULAR | Status: DC | PRN
  Administered 2020-11-09 – 2020-11-10 (×3): 12.5 ug via INTRAVENOUS
  Filled 2020-11-09 (×3): qty 2

## 2020-11-09 NOTE — Anesthesia Postprocedure Evaluation (Signed)
Anesthesia Post Note  Patient: Latasha [name]  Procedure(s) Performed: ESOPHAGOGASTRODUODENOSCOPY (EGD) (N/A ) BIOPSY     Patient location during evaluation: PACU Anesthesia Type: General Level of consciousness: awake Pain management: pain level controlled Vital Signs Assessment: post-procedure vital signs reviewed and stable Respiratory status: spontaneous breathing, respiratory function stable and patient connected to nasal cannula oxygen Cardiovascular status: stable Postop Assessment: no apparent nausea or vomiting Anesthetic complications: no   No complications documented.  Last Vitals:  Vitals:   11/09/20 1313 11/09/20 1339  BP: 138/61 (!) 130/54  Pulse: 66 67  Resp: 12 (!) 24  Temp:  36.4 C  SpO2: 95% 99%    Last Pain:  Vitals:   11/09/20 1339  TempSrc: Oral  PainSc:                  Merlinda Frederick

## 2020-11-09 NOTE — Progress Notes (Signed)
PT Cancellation Note  Patient Details Name: Latasha [name] MRN: [id number] DOB: 11-Dec-1955   Cancelled Treatment:    Reason Eval/Treat Not Completed: Patient at procedure or test/unavailable Will check back another time.   Claretha Cooper 11/09/2020, 11:56 AM [place] Pager [phone] Office [phone]

## 2020-11-09 NOTE — Progress Notes (Signed)
PROGRESS NOTE    Latasha [name]  [id number] DOB: 09/02/1956 DOA: 11/08/2020 PCP: Shirline Frees, MD    Brief Narrative:  Latasha [name] is a 65 year old female with past medical history significant for chronic diastolic congestive heart failure, CKD stage IV, type 2 diabetes mellitus, essential hypertension, morbid obesity who presented to the ED with 2-day history of dark stools.  Patient also reports fall 2 days ago without head injury or loss of consciousness.  Patient also complains of progressive weakness and fatigue.  She fell once again last night and was unable to get up.  She was then transported to the ED for further evaluation.  In the ED, temperature 98.5, HR 77, RR 24, BP 118/48, SPO2 100% on 4 L nasal cannula.  Sodium 139, potassium 4.6, chloride 101, CO2 26, glucose 167, BUN 67, creatinine 2.90.  Troponin 8.  BNP 133.4.  CRP 6.5, D-dimer 2.31.  Procalcitonin 0.26.  WBC 8.6, hemoglobin 4.0, platelets 272.  INR 1.2.  FOBT positive.  Chest x-ray with question of diffuse bilateral airspace disease, symmetric and may represent edema versus pneumonia, progressive left lower lobe consolidation.  Right ankle x-ray notable for nondisplaced fracture distal fibula.  CT abdomen/pelvis without contrast with no acute findings, skin thickening/subcutaneous soft tissue stranding pannus, scattered irregular pulmonary opacities within the right middle lobe, lingula and lower lobes compatible with sequelae of recent Covid-19 pneumonia.  Orthopedics and [facility] consult.  Hospital service consulted for further evaluation and management of acute symptomatic anemia secondary to upper GI bleed and right distal fibular fracture.   Assessment & Plan:   Principal Problem:   GIB (gastrointestinal bleeding) Active Problems:   Insulin-requiring or dependent type II diabetes mellitus ([place])   Essential hypertension   GASTROESOPHAGEAL REFLUX DISEASE   Hyperlipemia   Chronic diastolic CHF (congestive heart  failure) (H[facility])   Acute respiratory failure with hypoxia ([facility])   CKD (chronic kidney disease) stage 4, GFR 15-29 ml/min ([facility])   Fibula fracture   Acute symptomatic anemia Upper GI bleed secondary to acute gastritis with duodenal ulcers Patient presenting to the ED with 2-day history of progressive fatigue, weakness and dark stools.  Hemoglobin on admission 4.0. [facility] was consulted and underwent EGD on 11/09/2020 by Dr. Michail Sermon with findings of erosions/erythema gastric antrum, nonobstructing nonbleeding cratered duodenal ulcers, erythema and ulceration duodenal bulb and biopsy taken. --s/p 5u pRBC --continue protonix drip --Hgb 4.0>6.9>7.3 --H/H q6h --Follow-up surgical pathology from EGD biopsy --Clear liquid diet --Transfuse for hemoglobin less than 7.0 --further per GI  Acute hypoxic respiratory failure Patient presenting with hypoxia, secondary to severe anemia with a hemoglobin of 4.0.  Initially requiring 4 L nasal cannula. --Transfuse as above --Continue to titrate supplemental oxygen, maintain SPO2 greater than 92%, now on 2 L nasal cannula  Acute distal right fibular fracture X-ray right ankle notable for nondisplaced fracture distal fibula.  Seen by orthopedics, recommend placement of a CAM boot.  Touchdown weightbearing.  Outpatient follow-up with Dr. Lorin Mercy. --PT/OT evaluation  CKD stage IV Patient follows with nephrology outpatient, Dr. Johnney Ou.  Recently had a tunneled HD catheter placed on 10/26/2020 for anticipated need for dialysis in the near future. --Cr 2.90>2.91 (2.82 on 1/26) --Avoid nephrotoxins, renal dose all medications --Follow BMP daily  Type 2 diabetes mellitus Home regimen includes insulin 70/30 60u [place] qAM and 50u SQ qPM.  --Moderate SSI for coverage --CBGs before every meal/at bedtime  Chronic diastolic congestive heart failure, compensated Includes carvedilol 25 mg twice daily, furosemide  80 mg p.o. daily. --Restart carvedilol 25 mg twice  daily --Furosemide 80 mg p.o. daily --Strict I's and O's and daily weights  HLD: Continue atorvastatin 40 mg p.o. daily  Morbid obesity Body mass index is 43.26 kg/m.  Discussed with patient needs for aggressive lifestyle changes/weight loss as this complicates all facets of care.  Outpatient follow-up with PCP.  May benefit from bariatric evaluation outpatient.    DVT prophylaxis: SCDs, chemical DVT prophylaxis contraindicated in setting of acute GI bleed   Code Status: Full Code Family Communication: Updated patient extensive bedside  Disposition Plan:  Level of care: Telemetry Status is: Inpatient  Remains inpatient appropriate because:Ongoing active pain requiring inpatient pain management, Ongoing diagnostic testing needed not appropriate for outpatient work up, Unsafe d/c plan, IV treatments appropriate due to intensity of illness or inability to take PO and Inpatient level of care appropriate due to severity of illness   Dispo: The patient is from: Home              Anticipated d/c is to: To be determined, home with home health versus SNF              Anticipated d/c date is: 2 days              Patient currently is not medically stable to d/c.   Difficult to place patient No  Consultants:   [facility], Dr. Michail Sermon  Orthopedics, Dr. Lorin Mercy  Procedures:   EGD 2/3  Antimicrobials:   None   Subjective: Patient seen and examined bedside, resting comfortably.  Complaining of some mild pain to right ankle.  Awaiting EGD this morning.  Has completed blood transfusion and hemoglobin up from a 4.0 to 7.3 this morning.  Weakness/fatigue improved.  No other complaints or concerns at this time.  Denies headache, no fever/chills/night sweats, no chest pain, palpitations, no abdominal pain, no paresthesias.  No acute events overnight per nurse staff.  Objective: Vitals:   11/09/20 1303 11/09/20 1313 11/09/20 1339 11/09/20 1442  BP: 133/60 138/61 (!) 130/54 (!) 137/55  Pulse:  66 66 67 72  Resp: 14 12 (!) 24 16  Temp:   97.6 F (36.4 C) 97.8 F (36.6 C)  TempSrc:   Oral Oral  SpO2: 95% 95% 99% 98%  Weight:      Height:        Intake/Output Summary (Last 24 hours) at 11/09/2020 1451 Last data filed at 11/09/2020 1446 Gross per 24 hour  Intake 1992.81 ml  Output 650 ml  Net 1342.81 ml   Filed Weights   11/09/20 0548 11/09/20 1207  Weight: 121 kg 116.1 kg    Examination:  General exam: Appears calm and comfortable, chronically ill in appearance; appears older than stated age Respiratory system: Clear to auscultation. Respiratory effort normal.  On 2 L nasal cannula with SPO2 97% Cardiovascular system: S1 & S2 heard, RRR. No JVD, murmurs, rubs, gallops or clicks. No pedal edema.  Noted tunneled HD catheter in place Gastrointestinal system: Abdomen is nondistended, soft and nontender. No organomegaly or masses felt. Normal bowel sounds heard. Central nervous system: Alert and oriented. No focal neurological deficits. Extremities: Symmetric 5 x 5 power. Skin: No rashes, lesions or ulcers Psychiatry: Judgement and insight appear poor. Mood & affect appropriate.     Data Reviewed: I have personally reviewed following labs and imaging studies  CBC: Recent Labs  Lab 11/08/20 0743 11/08/20 1311 11/09/20 0328 11/09/20 0913  WBC 8.6 7.8 7.9 7.2  NEUTROABS 6.4  --  5.3 4.8  HGB 4.0* 4.9* 6.9* 7.3*  HCT 13.7* 15.8* 21.5* 22.2*  MCV 97.2 94.6 93.9 92.5  PLT 272 227 198 [id number]   Basic Metabolic Panel: Recent Labs  Lab 11/08/20 0743 11/09/20 0328  NA 139 141  K 4.6 4.7  CL 101 103  CO2 26 24  GLUCOSE 167* 179*  BUN 67* 76*  CREATININE 2.90* 2.91*  CALCIUM 7.4* 6.9*  MG  --  2.0  PHOS  --  5.0*   GFR: Estimated Creatinine Clearance: 25 mL/min (A) (by C-G formula based on SCr of 2.91 mg/dL (H)). Liver Function Tests: Recent Labs  Lab 11/08/20 0743 11/09/20 0328  AST 16 12*  ALT 15 14  ALKPHOS 58 50  BILITOT 0.4 0.6  PROT 6.0* 5.1*   ALBUMIN 2.3* 2.3*   No results for input(s): LIPASE, AMYLASE in the last 168 hours. No results for input(s): AMMONIA in the last 168 hours. Coagulation Profile: Recent Labs  Lab 11/08/20 0743  INR 1.2   Cardiac Enzymes: No results for input(s): CKTOTAL, CKMB, CKMBINDEX, TROPONINI in the last 168 hours. BNP (last 3 results) No results for input(s): PROBNP in the last 8760 hours. HbA1C: No results for input(s): HGBA1C in the last 72 hours. CBG: Recent Labs  Lab 11/08/20 1830 11/08/20 2147 11/09/20 0727 11/09/20 1153  GLUCAP 171* 166* 172* 136*   Lipid Profile: Recent Labs    11/08/20 0743  TRIG 171*   Thyroid Function Tests: No results for input(s): TSH, T4TOTAL, FREET4, T3FREE, THYROIDAB in the last 72 hours. Anemia Panel: Recent Labs    11/08/20 0743 11/09/20 0328  FERRITIN 621* 393*   Sepsis Labs: Recent Labs  Lab 11/08/20 0743  PROCALCITON 0.26    No results found for this or any previous visit (from the past 240 hour(s)).       Radiology Studies: CT ABDOMEN PELVIS WO CONTRAST  Result Date: 11/08/2020 CLINICAL DATA:  Abdominal pain.  Recent admission for COVID-19. EXAM: CT ABDOMEN AND PELVIS WITHOUT CONTRAST TECHNIQUE: Multidetector CT imaging of the abdomen and pelvis was performed following the standard protocol without IV contrast. COMPARISON:  CT AP 05/01/2017 FINDINGS: Lower chest: Scattered, irregular pulmonary opacities are identified within the right middle lobe, lingula and both lower lobes compatible with sequelae of recent COVID-19 pneumonia. No pleural effusion. Hepatobiliary: No focal liver abnormality is seen. No gallstones, gallbladder wall thickening, or biliary dilatation. Pancreas: Mild diffuse fatty infiltration of the pancreas. No pancreatic inflammation, main duct dilatation or mass noted Spleen: Normal in size without focal abnormality. Adrenals/Urinary Tract: Normal appearance of the adrenal glands. No kidney mass or hydronephrosis  identified. No kidney stones. No hydroureter or ureteral lithiasis. Urinary bladder is unremarkable. Stomach/Bowel: Stomach appears normal. The appendix is visualized and appears normal. No bowel wall thickening, inflammation or distension. Sigmoid diverticulosis noted without signs of acute inflammation. Vascular/Lymphatic: Aortic atherosclerosis. No abdominopelvic adenopathy identified. Reproductive: Status post hysterectomy. No adnexal masses. Other: No free fluid or fluid collections. Fat containing umbilical hernia noted. Musculoskeletal: Skin thickening and subcutaneous soft tissue stranding noted involving the patient's pannus. No focal body wall fluid collection identified to suggest abscess. Degenerative disc disease noted at L5-S1. IMPRESSION: 1. No acute findings identified within the abdomen or pelvis. 2. Skin thickening and subcutaneous soft tissue stranding involving the patient's pannus. Correlate for any signs of cellulitis. 3. Scattered, irregular pulmonary opacities are identified within the right middle lobe, lingula and both lower lobes compatible with sequelae of recent  COVID-19 pneumonia. 4. Aortic atherosclerosis. Aortic Atherosclerosis (ICD10-I70.0). Electronically Signed   By: Kerby Moors M.D.   On: 11/08/2020 14:25   DG Chest Port 1 View  Result Date: 11/08/2020 CLINICAL DATA:  Worsening hypoxia.  COVID positive EXAM: PORTABLE CHEST 1 VIEW COMPARISON:  10/22/2020 FINDINGS: Progression of diffuse bilateral airspace disease. Possible edema or pneumonia. Progressive left lower lobe consolidation. Interval placement of right jugular dual lumen catheter with the tip in the right atrium. No pneumothorax. IMPRESSION: Progression of diffuse bilateral airspace disease. This is symmetric and may represent edema versus pneumonia. Progressive left lower lobe consolidation. Electronically Signed   By: Franchot Gallo M.D.   On: 11/08/2020 08:45   DG Ankle Left Port  Result Date:  11/08/2020 CLINICAL DATA:  Right ankle pain EXAM: PORTABLE LEFT ANKLE - 2 VIEW COMPARISON:  None. FINDINGS: There is no evidence of fracture, dislocation, or joint effusion. There is no evidence of arthropathy or other focal bone abnormality. Diffuse arterial calcification. IMPRESSION: Negative. Electronically Signed   By: Franchot Gallo M.D.   On: 11/08/2020 08:46   DG Ankle Right Port  Result Date: 11/08/2020 CLINICAL DATA:  Multiple falls EXAM: PORTABLE RIGHT ANKLE - 2 VIEW COMPARISON:  None. FINDINGS: Nondisplaced fracture distal fibula. No fracture of the tibia. Ankle mortise intact. Arterial calcification IMPRESSION: Nondisplaced fracture distal fibula. Electronically Signed   By: Franchot Gallo M.D.   On: 11/08/2020 08:47   VAS [place] LOWER EXTREMITY VENOUS (DVT)  Result Date: 11/08/2020  Lower Venous DVT Study Indications: Covid+ elevated d-dimer.  Limitations: Body habitus and poor ultrasound/tissue interface. Comparison Study: Previous exam 06/08/2017 - negative Performing Technologist: Rogelia Rohrer  Examination Guidelines: A complete evaluation includes B-mode imaging, spectral Doppler, color Doppler, and power Doppler as needed of all accessible portions of each vessel. Bilateral testing is considered an integral part of a complete examination. Limited examinations for reoccurring indications may be performed as noted. The reflux portion of the exam is performed with the patient in reverse Trendelenburg.  +---------+---------------+---------+-----------+----------+-------------------+ RIGHT    CompressibilityPhasicitySpontaneityPropertiesThrombus Aging      +---------+---------------+---------+-----------+----------+-------------------+ CFV      Full           Yes      Yes                                      +---------+---------------+---------+-----------+----------+-------------------+ SFJ      Full                                                              +---------+---------------+---------+-----------+----------+-------------------+ FV Prox  Full           Yes      Yes                                      +---------+---------------+---------+-----------+----------+-------------------+ FV Mid   Full           Yes      Yes                                      +---------+---------------+---------+-----------+----------+-------------------+  FV DistalFull           Yes      Yes                                      +---------+---------------+---------+-----------+----------+-------------------+ PFV      Full                                                             +---------+---------------+---------+-----------+----------+-------------------+ POP      Full           Yes      Yes                                      +---------+---------------+---------+-----------+----------+-------------------+ PTV      Full                                                             +---------+---------------+---------+-----------+----------+-------------------+ PERO     Full                                         Not well visualized +---------+---------------+---------+-----------+----------+-------------------+   +---------+---------------+---------+-----------+----------+-------------------+ LEFT     CompressibilityPhasicitySpontaneityPropertiesThrombus Aging      +---------+---------------+---------+-----------+----------+-------------------+ CFV      Full           Yes      Yes                                      +---------+---------------+---------+-----------+----------+-------------------+ SFJ      Full                                                             +---------+---------------+---------+-----------+----------+-------------------+ FV Prox  Full           Yes      Yes                                      +---------+---------------+---------+-----------+----------+-------------------+ FV  Mid   Full           Yes      Yes                                      +---------+---------------+---------+-----------+----------+-------------------+ FV DistalFull           Yes      Yes                                      +---------+---------------+---------+-----------+----------+-------------------+  PFV      Full                                                             +---------+---------------+---------+-----------+----------+-------------------+ POP      Full           Yes      Yes                                      +---------+---------------+---------+-----------+----------+-------------------+ PTV      Full                                                             +---------+---------------+---------+-----------+----------+-------------------+ PERO     Full                                         Not well visualized +---------+---------------+---------+-----------+----------+-------------------+     Summary: BILATERAL: - No evidence of deep vein thrombosis seen in the lower extremities, bilaterally. -No evidence of popliteal cyst, bilaterally.   *See table(s) above for measurements and observations. Electronically signed by Harold Barban MD on 11/08/2020 at 7:13:26 PM.    Final         Scheduled Meds: . vitamin C  500 mg Oral Daily  . atorvastatin  40 mg Oral Daily  . carvedilol  25 mg Oral BID WC  . furosemide  80 mg Oral Daily  . insulin aspart  0-15 Units Subcutaneous TID WC  . insulin aspart  0-5 Units Subcutaneous QHS  . [START ON 11/11/2020] pantoprazole  40 mg Intravenous Q12H  . zinc sulfate  220 mg Oral Daily   Continuous Infusions: . pantoprozole (PROTONIX) infusion 8 mg/hr (11/09/20 0541)     LOS: 1 day    Time spent: 42 minutes spent on chart review, discussion with nursing staff, consultants, updating family and interview/physical exam; more than 50% of that time was spent in counseling and/or coordination of  care.    Eric J [place] DO [facility] Available via Epic secure chat 7am-7pm After these hours, please refer to coverage provider listed on [email or web address] 11/09/2020, 2:51 PM

## 2020-11-09 NOTE — Interval H&P Note (Signed)
History and Physical Interval Note:  11/09/2020 12:28 PM  Latasha [name]  has presented today for surgery, with the diagnosis of severe anemia, melena.  The various methods of treatment have been discussed with the patient and family. After consideration of risks, benefits and other options for treatment, the patient has consented to  Procedure(s): ESOPHAGOGASTRODUODENOSCOPY (EGD) (N/A) as a surgical intervention.  The patient's history has been reviewed, patient examined, no change in status, stable for surgery.  I have reviewed the patient's chart and labs.  Questions were answered to the patient's satisfaction.     Lear Ng

## 2020-11-09 NOTE — Op Note (Signed)
[facility] Patient Name: Latasha [name] Procedure Date: 11/09/2020 MRN: [name] Attending MD: Lear Ng , MD Date of Birth: 12/28/1955 CSN: [id number] Age: 65 Admit Type: Inpatient Procedure:                Upper GI endoscopy Indications:              Active gastrointestinal bleeding, Melena Providers:                Lear Ng, MD, Particia Nearing, RN, Cletis Athens, Technician Referring MD:             hospital team Medicines:                Propofol per Anesthesia, Monitored Anesthesia Care Complications:            No immediate complications. Estimated Blood Loss:     Estimated blood loss was minimal. Procedure:                Pre-Anesthesia Assessment:                           - Prior to the procedure, a History and Physical                            was performed, and patient medications and                            allergies were reviewed. The patient's tolerance of                            previous anesthesia was also reviewed. The risks                            and benefits of the procedure and the sedation                            options and risks were discussed with the patient.                            All questions were answered, and informed consent                            was obtained. Prior Anticoagulants: The patient has                            taken no previous anticoagulant or antiplatelet                            agents. ASA Grade Assessment: III - A patient with                            severe systemic disease. After reviewing the risks  and benefits, the patient was deemed in                            satisfactory condition to undergo the procedure.                           After obtaining informed consent, the endoscope was                            passed under direct vision. Throughout the                            procedure, the patient's blood  pressure, pulse, and                            oxygen saturations were monitored continuously. The                            [id number]) was introduced through the                            mouth, and advanced to the second part of duodenum.                            The upper GI endoscopy was accomplished without                            difficulty. The patient tolerated the procedure                            well. Scope In: Scope Out: Findings:      The examined esophagus was normal.      Patchy mild inflammation characterized by congestion (edema), erosions       and erythema was found in the gastric antrum. Biopsies were taken with a       cold forceps for histology. Estimated blood loss was minimal.      The cardia and gastric fundus were normal on retroflexion.      Many non-obstructing non-bleeding cratered duodenal ulcers with no       stigmata of bleeding were found in the duodenal bulb.      Segmental severe mucosal changes characterized by congestion, erythema       and ulceration were found in the duodenal bulb. Biopsies were taken with       a cold forceps for histology. Estimated blood loss was minimal.      The second portion of the duodenum was normal. Impression:               - Normal esophagus.                           - Acute gastritis. Biopsied.                           - Non-obstructing non-bleeding duodenal ulcers with  no stigmata of bleeding.                           - Mucosal changes in the duodenum. Biopsied.                           - Normal second portion of the duodenum. Moderate Sedation:      Not Applicable - Patient had care per Anesthesia. Recommendation:           - Clear liquid diet.                           - Observe patient's clinical course.                           - Await pathology results. Procedure Code(s):        --- Professional ---                           [phone], Esophagogastroduodenoscopy,  flexible,                            transoral; with biopsy, single or multiple Diagnosis Code(s):        --- Professional ---                           K92.2, Gastrointestinal hemorrhage, unspecified                           K92.1, Melena (includes Hematochezia)                           K26.9, Duodenal ulcer, unspecified as acute or                            chronic, without hemorrhage or perforation                           K29.00, Acute gastritis without bleeding                           K31.89, Other diseases of stomach and duodenum CPT copyright 2019 [employer]. All rights reserved. The codes documented in this report are preliminary and upon coder review may  be revised to meet current compliance requirements. Lear Ng, MD 11/09/2020 12:56:15 PM This report has been signed electronically. Number of Addenda: 0

## 2020-11-09 NOTE — Anesthesia Preprocedure Evaluation (Addendum)
Anesthesia Evaluation  Patient identified by MRN, date of birth, ID band Patient awake    Reviewed: Allergy & Precautions, NPO status , Patient's Chart, lab work & pertinent test results  Airway Mallampati: II  TM Distance: >3 FB Neck ROM: Full    Dental   Most teeth missing:   Pulmonary pneumonia (COVID pneumonia, improved),     + wheezing      Cardiovascular hypertension, +CHF   Rhythm:Regular Rate:Tachycardia  Sinus rhythm Low voltage, precordial leads Probable LVH with secondary repol abnrm Confirmed by Madalyn Rob [phone]) on 11/08/2020 8:09:41 AM  ECHO 02/2020: 1. Left ventricular ejection fraction, by estimation, is 65 to 70%. The  left ventricle has normal function. The left ventricle has no regional  wall motion abnormalities. There is severe left ventricular hypertrophy.  Left ventricular diastolic parameters  are consistent with Grade I diastolic dysfunction (impaired relaxation).  2. Right ventricular systolic function is normal. The right ventricular  size is normal. Tricuspid regurgitation signal is inadequate for assessing  PA pressure.  3. The mitral valve is normal in structure. Trivial mitral valve  regurgitation. No evidence of mitral stenosis.  4. The aortic valve is tricuspid. Aortic valve regurgitation is not  visualized. No aortic stenosis is present.    Neuro/Psych  Neuromuscular disease (sciatica) negative psych ROS   GI/Hepatic GERD  ,  Endo/Other  diabetesMorbid obesity  Renal/GU ARF and Renal InsufficiencyRenal disease     Musculoskeletal   Abdominal   Peds  Hematology   Anesthesia Other Findings Non displaced ankle fracture  Reproductive/Obstetrics                            Anesthesia Physical Anesthesia Plan  ASA: III  Anesthesia Plan: General   Post-op Pain Management:    Induction: Intravenous  PONV Risk Score and Plan: 3  Airway  Management Planned: Oral ETT and Video Laryngoscope Planned  Additional Equipment: None  Intra-op Plan:   Post-operative Plan: Extubation in OR  Informed Consent: I have reviewed the patients History and Physical, chart, labs and discussed the procedure including the risks, benefits and alternatives for the proposed anesthesia with the patient or authorized representative who has indicated his/her understanding and acceptance.       Plan Discussed with: CRNA and Anesthesiologist  Anesthesia Plan Comments: (COVID precautions. GETA/RSI)       Anesthesia Quick Evaluation

## 2020-11-09 NOTE — Transfer of Care (Signed)
Immediate Anesthesia Transfer of Care Note  Patient: Latasha [name]  Procedure(s) Performed: ESOPHAGOGASTRODUODENOSCOPY (EGD) (N/A ) BIOPSY  Patient Location: PACU  Anesthesia Type:General  Level of Consciousness: awake, alert , oriented and patient cooperative  Airway & Oxygen Therapy: Patient Spontanous Breathing and Patient connected to face mask oxygen  Post-op Assessment: Report given to RN, Post -op Vital signs reviewed and stable and Patient moving all extremities  Post vital signs: Reviewed and stable  Last Vitals:  Vitals Value Taken Time  BP 147/58 11/09/20 1252  Temp 36.3 C 11/09/20 1252  Pulse 67 11/09/20 1252  Resp 14 11/09/20 1252  SpO2 100 % 11/09/20 1252    Last Pain:  Vitals:   11/09/20 1252  TempSrc: Axillary  PainSc: 0-No pain      Patients Stated Pain Goal: 2 ([id number])  Complications: No complications documented.

## 2020-11-09 NOTE — Anesthesia Procedure Notes (Signed)
Procedure Name: Intubation Date/Time: 11/09/2020 12:29 PM Performed by: Victoriano Lain, CRNA Pre-anesthesia Checklist: Patient identified, Emergency Drugs available, Patient being monitored, Timeout performed and Suction available Patient Re-evaluated:Patient Re-evaluated prior to induction Oxygen Delivery Method: Circle system utilized Preoxygenation: Pre-oxygenation with 100% oxygen Induction Type: IV induction, Rapid sequence and Cricoid Pressure applied Laryngoscope Size: Glidescope and 4 Grade View: Grade I Tube type: Parker flex tip Tube size: 7.5 mm Number of attempts: 1 Airway Equipment and Method: Video-laryngoscopy and Stylet Placement Confirmation: ETT inserted through vocal cords under direct vision,  positive ETCO2 and breath sounds checked- equal and bilateral Secured at: 22 cm Tube secured with: Tape Dental Injury: Teeth and Oropharynx as per pre-operative assessment

## 2020-11-10 ENCOUNTER — Encounter (HOSPITAL_COMMUNITY): Payer: Self-pay | Admitting: Gastroenterology

## 2020-11-10 DIAGNOSIS — I5032 Chronic diastolic (congestive) heart failure: Secondary | ICD-10-CM | POA: Diagnosis not present

## 2020-11-10 DIAGNOSIS — N184 Chronic kidney disease, stage 4 (severe): Secondary | ICD-10-CM | POA: Diagnosis not present

## 2020-11-10 DIAGNOSIS — J9601 Acute respiratory failure with hypoxia: Secondary | ICD-10-CM | POA: Diagnosis not present

## 2020-11-10 DIAGNOSIS — K922 Gastrointestinal hemorrhage, unspecified: Secondary | ICD-10-CM | POA: Diagnosis not present

## 2020-11-10 LAB — CBC WITH DIFFERENTIAL/PLATELET
Abs Immature Granulocytes: 0.1 10*3/uL — ABNORMAL HIGH (ref 0.00–0.07)
Basophils Absolute: 0 10*3/uL (ref 0.0–0.1)
Basophils Relative: 0 %
Eosinophils Absolute: 0 10*3/uL (ref 0.0–0.5)
Eosinophils Relative: 0 %
HCT: 22.4 % — ABNORMAL LOW (ref 36.0–46.0)
Hemoglobin: 7 g/dL — ABNORMAL LOW (ref 12.0–15.0)
Immature Granulocytes: 2 %
Lymphocytes Relative: 15 %
Lymphs Abs: 0.9 10*3/uL (ref 0.7–4.0)
MCH: 29.3 pg (ref 26.0–34.0)
MCHC: 31.3 g/dL (ref 30.0–36.0)
MCV: 93.7 fL (ref 80.0–100.0)
Monocytes Absolute: 0.1 10*3/uL (ref 0.1–1.0)
Monocytes Relative: 2 %
Neutro Abs: 4.6 10*3/uL (ref 1.7–7.7)
Neutrophils Relative %: 81 %
Platelets: 211 10*3/uL (ref 150–400)
RBC: 2.39 MIL/uL — ABNORMAL LOW (ref 3.87–5.11)
RDW: 17 % — ABNORMAL HIGH (ref 11.5–15.5)
WBC: 5.7 10*3/uL (ref 4.0–10.5)
nRBC: 0 % (ref 0.0–0.2)

## 2020-11-10 LAB — SURGICAL PATHOLOGY

## 2020-11-10 LAB — COMPREHENSIVE METABOLIC PANEL
ALT: 13 U/L (ref 0–44)
AST: 13 U/L — ABNORMAL LOW (ref 15–41)
Albumin: 2.4 g/dL — ABNORMAL LOW (ref 3.5–5.0)
Alkaline Phosphatase: 51 U/L (ref 38–126)
Anion gap: 12 (ref 5–15)
BUN: 74 mg/dL — ABNORMAL HIGH (ref 8–23)
CO2: 24 mmol/L (ref 22–32)
Calcium: 7.1 mg/dL — ABNORMAL LOW (ref 8.9–10.3)
Chloride: 103 mmol/L (ref 98–111)
Creatinine, Ser: 2.88 mg/dL — ABNORMAL HIGH (ref 0.44–1.00)
GFR, Estimated: 18 mL/min — ABNORMAL LOW (ref >60)
Glucose, Bld: 333 mg/dL — ABNORMAL HIGH (ref 70–99)
Potassium: 5.5 mmol/L — ABNORMAL HIGH (ref 3.5–5.1)
Sodium: 139 mmol/L (ref 135–145)
Total Bilirubin: 0.5 mg/dL (ref 0.3–1.2)
Total Protein: 5.6 g/dL — ABNORMAL LOW (ref 6.5–8.1)

## 2020-11-10 LAB — HEMOGLOBIN AND HEMATOCRIT, BLOOD
HCT: 21.8 % — ABNORMAL LOW (ref 36.0–46.0)
HCT: 23.5 % — ABNORMAL LOW (ref 36.0–46.0)
HCT: 24.5 % — ABNORMAL LOW (ref 36.0–46.0)
Hemoglobin: 7 g/dL — ABNORMAL LOW (ref 12.0–15.0)
Hemoglobin: 7.7 g/dL — ABNORMAL LOW (ref 12.0–15.0)
Hemoglobin: 7.9 g/dL — ABNORMAL LOW (ref 12.0–15.0)

## 2020-11-10 LAB — GLUCOSE, CAPILLARY
Glucose-Capillary: 295 mg/dL — ABNORMAL HIGH (ref 70–99)
Glucose-Capillary: 269 mg/dL — ABNORMAL HIGH (ref 70–99)
Glucose-Capillary: 321 mg/dL — ABNORMAL HIGH (ref 70–99)
Glucose-Capillary: 305 mg/dL — ABNORMAL HIGH (ref 70–99)

## 2020-11-10 LAB — PREPARE RBC (CROSSMATCH)

## 2020-11-10 MED ORDER — SODIUM CHLORIDE 0.9% IV SOLUTION: Freq: Once | INTRAVENOUS | Status: AC

## 2020-11-10 MED ORDER — INSULIN GLARGINE 100 UNIT/ML ~~LOC~~ SOLN
10.0000 [IU] | Freq: Every day | SUBCUTANEOUS | Status: DC
  Administered 2020-11-10 – 2020-11-11 (×2): 10 [IU] via SUBCUTANEOUS
  Filled 2020-11-10 (×2): qty 0.1

## 2020-11-10 MED ORDER — CHLORHEXIDINE GLUCONATE CLOTH 2 % EX PADS
6.0000 | MEDICATED_PAD | Freq: Every day | CUTANEOUS | Status: DC
  Administered 2020-11-10 – 2020-11-12 (×3): 6 via TOPICAL

## 2020-11-10 NOTE — Evaluation (Signed)
Physical Therapy Evaluation Patient Details Name: Latasha [name] MRN: [name] DOB: [birth year] Today's Date: 11/10/2020   History of Present Illness  Latasha [name] is a 65 y.o. female with medical history significant of HFpEF, CKD IV, DM2, HTN, morbid obesity. DC from [facility] with covid on. On 11/08/2020,Presenting with dark stools and multiple falls, right ankle pain.Hgb noted to be 4.0. FOBT positive. XR right ankle: Nondisplaced fracture distal fibula  Clinical Impression  The patient  In bed , reports incontinence of dark loose stool. Assisted with cleaning patient up with 2 more incidences. Patient did  Sit on bed side for ~ 8 minutes. Did not attempt transfers due to loose Stools frequently.  Patient reports daughter and spouse will be available.  Patient definitely will need WC at DC as most likely will not be able to ambulate at TDWB status on R.   Pt admitted with above diagnosis.  Pt currently with functional limitations due to the deficits listed below (see PT Problem List). Pt will benefit from skilled PT to increase their independence and safety with mobility to allow discharge to the venue listed below.       Follow Up Recommendations  HHPT    Equipment Recommendations    WC was ordered by HHPT PTA per pt.- patient will need WC for DC from hospital    Recommendations for Other Services       Precautions / Restrictions Precautions Precautions: Fall Precaution Comments: cam boot right, frequent dark loose stools. Restrictions RLE Weight Bearing: Touchdown weight bearing Other Position/Activity Restrictions: per ortho consult. Cam boot      Mobility  Bed Mobility   Bed Mobility: Rolling Rolling: Min guard   Supine to sit: Min assist;HOB elevated Sit to supine: Supervision   General bed mobility comments: use of bed rail for rolling, Gentle min assist to sit up. Min assist with legs back onto bed.    Transfers                 General transfer comment:  TBA, having dark stools  Ambulation/Gait                Stairs            Wheelchair Mobility    Modified Rankin (Stroke Patients Only)       Balance Overall balance assessment: Needs assistance Sitting-balance support: No upper extremity supported;Feet supported Sitting balance-Leahy Scale: Fair         Standing balance comment: NT                             Pertinent Vitals/Pain Pain Assessment: Faces Faces Pain Scale: Hurts a little bit Pain Location: stomach Pain Descriptors / Indicators: Tightness;Discomfort Pain Intervention(s): Monitored during session    Home Living Family/patient expects to be discharged to:: Private residence Living Arrangements: Spouse/significant other Available Help at Discharge: Family;Available 24 hours/day Type of Home: House Home Access: Stairs to enter Entrance Stairs-Rails: Right;Left;Can reach both Entrance Stairs-Number of Steps: 3 Home Layout: One level Home Equipment: Walker - 2 wheels;Walker - 4 wheels;Cane - single point;Shower seat;Adaptive equipment Additional Comments: has been limited distance due to weakness. HHPT was ordering a WC    Prior Function Level of Independence: Independent with assistive device(s)         Comments: recent fall which is why her husband called the ambulance to be admitted, pt does not drive to to very poor vision. Pt  reports using Rollator for mobility in the home, able to complete ADLs (uses AE for LB ADLs)     Hand Dominance   Dominant Hand: Right    Extremity/Trunk Assessment        Lower Extremity Assessment RLE Sensation: history of peripheral neuropathy LLE Sensation: history of peripheral neuropathy    Cervical / Trunk Assessment Cervical / Trunk Exceptions: pt reports h/o low back arthritis,  Communication   Communication: No difficulties  Cognition Arousal/Alertness: Awake/alert Behavior During Therapy: WFL for tasks  assessed/performed Overall Cognitive Status: Within Functional Limits for tasks assessed                                        General Comments      Exercises     Assessment/Plan    PT Assessment Patient needs continued PT services  PT Problem List Decreased strength;Decreased knowledge of precautions;Decreased mobility;Decreased activity tolerance;Decreased safety awareness       PT Treatment Interventions DME instruction;Gait training;Stair training;Functional mobility training;Therapeutic activities;Therapeutic exercise;Balance training;Patient/family education    PT Goals (Current goals can be found in the Care Plan section)  Acute Rehab PT Goals Patient Stated Goal: be able to get better and go home, recover at home PT Goal Formulation: With patient Time For Goal Achievement: 11/24/20 Potential to Achieve Goals: Fair    Frequency Min 3X/week   Barriers to discharge        Co-evaluation PT/OT/SLP Co-Evaluation/Treatment: Yes Reason for Co-Treatment: For patient/therapist safety;To address functional/ADL transfers PT goals addressed during session: Mobility/safety with mobility OT goals addressed during session: ADL's and self-care       AM-PAC PT "6 Clicks" Mobility  Outcome Measure                  End of Session   Activity Tolerance: Treatment limited secondary to medical complications (Comment) (loose dark sttols) Patient left: with bed alarm set Nurse Communication: Mobility status PT Visit Diagnosis: Muscle weakness (generalized) (M62.81);Difficulty in walking, not elsewhere classified (R26.2);Pain Pain - Right/Left: Right Pain - part of body: Ankle and joints of foot    Time: [id number] PT Time Calculation (min) (ACUTE ONLY): 47 min   Charges:   PT Evaluation $PT Eval Low Complexity: 1 Low PT Treatments $Therapeutic Activity: 8-22 mins        Tresa Endo PT Acute Rehabilitation Services Pager [phone] Office  [phone]   Claretha Cooper 11/10/2020, 2:20 PM

## 2020-11-10 NOTE — Consult Note (Signed)
   [facility] CM Inpatient Consult   11/10/2020  Latasha [name] May 02, 1956 [id number]   Patient is currently active with [facility] for chronic disease management services.  Plan: Will continue to follow for progression and disposition plans and update community care manager of patient hospital progression.   Of note, [facility] Care Management services does not replace or interfere with any services that are arranged by inpatient case management or social work.  Netta Cedars, MSN, [facility] Liaison Nurse Mobile Phone [phone]  Toll free office [phone]

## 2020-11-10 NOTE — Progress Notes (Signed)
[facility] Gastroenterology Progress Note  Latasha [name] 65 y.o. 11/12/1955  CC:  Upper GI bleeding   Subjective: Patient reports feeling better today.  She has mild periumbilical abdominal discomfort but no overt pain.  Denies nausea/vomiting.  Last stool was yesterday and it was melenic.  ROS : Review of Systems  Cardiovascular: Negative for chest pain and palpitations.  Gastrointestinal: Positive for melena. Negative for abdominal pain, blood in stool, constipation, diarrhea, heartburn, nausea and vomiting.    Objective: Vital signs in last 24 hours: Vitals:   11/10/20 0839 11/10/20 0900  BP: (!) 145/57 (!) 136/56  Pulse: 75 76  Resp: 16 20  Temp: (!) 97.5 F (36.4 C) 97.9 F (36.6 C)  SpO2: 98% 94%    Physical Exam:  General:  Alert, oriented, cooperative, no distress  Head:  Normocephalic, without obvious abnormality, atraumatic  Eyes:  Conjunctival pallor, EOMs intact  Lungs:   Clear to auscultation bilaterally, respirations unlabored  Heart:  Regular rate and rhythm, S1, S2 normal  Abdomen:   Soft, non-tender, non-distended, bowel sounds active all four quadrants,  no guarding or peritoneal signs  Extremities: +bilateral lower extremity edema  Pulses: 2+ and symmetric    Lab Results: Recent Labs    11/09/20 0328 11/10/20 0226  NA 141 139  K 4.7 5.5*  CL 103 103  CO2 24 24  GLUCOSE 179* 333*  BUN 76* 74*  CREATININE 2.91* 2.88*  CALCIUM 6.9* 7.1*  MG 2.0  --   PHOS 5.0*  --    Recent Labs    11/09/20 0328 11/10/20 0226  AST 12* 13*  ALT 14 13  ALKPHOS 50 51  BILITOT 0.6 0.5  PROT 5.1* 5.6*  ALBUMIN 2.3* 2.4*   Recent Labs    11/09/20 0913 11/09/20 1505 11/09/20 2059 11/10/20 0226  WBC 7.2  --   --  5.7  NEUTROABS 4.8  --   --  4.6  HGB 7.3*   < > 7.2* 7.0*  7.0*  HCT 22.2*   < > 22.2* 21.8*  22.4*  MCV 92.5  --   --  93.7  PLT 185  --   --  211   < > = values in this interval not displayed.   Recent Labs    11/08/20 0743  LABPROT  15.0  INR 1.2     Assessment: Upper GI bleeding: non-obstructing and non-bleeding duodenal ulcer and gastritis on EGD 11/09/20, biopsies pending -Hgb 7.0 today, stable as compared to 7.2 yesterday.  She is receiving 1u pRBCs currently.  She also received 1u yesterday and 4u on 2/2.  Recent COVID-19 PNA  AKI on CKD: BUN 74/ Cr 2.88  Plan: Continue Protonix IV drip for a total of 72 hours, followed by Protonix 40 mg IV BID.  Transition to Protonix 40 mg PO BID at discharge.  Continue BID dosing for one month, then decrease to once daily dosing.  Advance diet to regular diet, given no active GI bleeding.  Continue to monitor H&H with transfusion as needed to maintain Hgb >7.  Repeat CBC 2 weeks after discharge with PCP or [facility].  Follow-up in GI clinic in 6-8 weeks.  [facility] will sign off.  Please contact us if we can be of any further assistance during this hospital stay.  Salley Slaughter PA-C 11/10/2020, 11:25 AM  Contact #  [phone]

## 2020-11-10 NOTE — Progress Notes (Addendum)
PROGRESS NOTE    Latasha [name]  [id number] DOB: Jun 20, 1956 DOA: 11/08/2020 PCP: Shirline Frees, MD    Brief Narrative:  Latasha [name] is a 65 year old female with past medical history significant for chronic diastolic congestive heart failure, CKD stage IV, type 2 diabetes mellitus, essential hypertension, morbid obesity who presented to the ED with 2-day history of dark stools.  Patient also reports fall 2 days ago without head injury or loss of consciousness.  Patient also complains of progressive weakness and fatigue.  She fell once again last night and was unable to get up.  She was then transported to the ED for further evaluation.  In the ED, temperature 98.5, HR 77, RR 24, BP 118/48, SPO2 100% on 4 L nasal cannula.  Sodium 139, potassium 4.6, chloride 101, CO2 26, glucose 167, BUN 67, creatinine 2.90.  Troponin 8.  BNP 133.4.  CRP 6.5, D-dimer 2.31.  Procalcitonin 0.26.  WBC 8.6, hemoglobin 4.0, platelets 272.  INR 1.2.  FOBT positive.  Chest x-ray with question of diffuse bilateral airspace disease, symmetric and may represent edema versus pneumonia, progressive left lower lobe consolidation.  Right ankle x-ray notable for nondisplaced fracture distal fibula.  CT abdomen/pelvis without contrast with no acute findings, skin thickening/subcutaneous soft tissue stranding pannus, scattered irregular pulmonary opacities within the right middle lobe, lingula and lower lobes compatible with sequelae of recent Covid-19 pneumonia.  Orthopedics and [facility] consult.  Hospital service consulted for further evaluation and management of acute symptomatic anemia secondary to upper GI bleed and right distal fibular fracture.   Assessment & Plan:   Principal Problem:   GIB (gastrointestinal bleeding) Active Problems:   Insulin-requiring or dependent type II diabetes mellitus ([place])   Essential hypertension   GASTROESOPHAGEAL REFLUX DISEASE   Hyperlipemia   Chronic diastolic CHF (congestive heart  failure) (HCC)   Acute respiratory failure with hypoxia (HCC)   CKD (chronic kidney disease) stage 4, GFR 15-29 ml/min (HCC)   Fibula fracture   Acute symptomatic anemia Upper GI bleed secondary to acute gastritis with duodenal ulcers Patient presenting to the ED with 2-day history of progressive fatigue, weakness and dark stools.  Hemoglobin on admission 4.0. [facility] was consulted and underwent EGD on 11/09/2020 by Dr. Michail Sermon with findings of erosions/erythema gastric antrum, nonobstructing nonbleeding cratered duodenal ulcers, erythema and ulceration duodenal bulb and biopsy taken. --Hgb 4.0>6.9>7.3>8.1>7.2>7.0>7.0 --s/p 5u pRBC; transfuse 1u pRBC today --continue protonix drip x 72 hours followed by Protonix 40 IV BID, and transition to '40mg'$  PO BID x 14monthfollowed by daily dosing.  --H/H q12h --Follow-up surgical pathology from EGD biopsy --Advance diet to heart healthy/consistent carbohydrate diet today --Transfuse for hemoglobin less than 7.0 --Outpatient follow-up with [facility] in 6-8 weeks, will need repeat CBC 2 weeks after discharge with PCP/GI  Acute hypoxic respiratory failure Patient presenting with hypoxia, secondary to severe anemia with a hemoglobin of 4.0.  Initially requiring 4 L nasal cannula. --Transfuse as above --Continue to titrate supplemental oxygen, maintain SPO2 greater than 92%, now on 2 L nasal cannula with SPO2 100%.  Acute distal right fibular fracture X-ray right ankle notable for nondisplaced fracture distal fibula.  Seen by orthopedics, recommend placement of a CAM boot.  Touchdown weightbearing.  Outpatient follow-up with Dr. YLorin Mercy --PT/OT evaluation pending  CKD stage IV Patient follows with nephrology outpatient, Dr. KJohnney Ou  Recently had a tunneled HD catheter placed on 10/26/2020 for anticipated need for dialysis in the near future. --Cr 2.90>2.91>2.88 (2.82 on 1/26) --  Avoid nephrotoxins, renal dose all medications --Follow BMP daily  Type 2  diabetes mellitus Home regimen includes insulin 70/30 60u [place] qAM and 50u SQ qPM.  --Lantus 10 units subcutaneously daily --Moderate SSI for coverage --CBGs before every meal/at bedtime  Chronic diastolic congestive heart failure, compensated Includes carvedilol 25 mg twice daily, furosemide 80 mg p.o. daily. --Carvedilol 25 mg twice daily --Furosemide 80 mg p.o. daily --Strict I's and O's and daily weights  HLD: Continue atorvastatin 40 mg p.o. daily  Morbid obesity Body mass index is 45.9 kg/m.  Discussed with patient needs for aggressive lifestyle changes/weight loss as this complicates all facets of care.  Outpatient follow-up with PCP.  May benefit from bariatric evaluation outpatient.  Hx COVID infection  Patient diagnosed with Covid-19 viral infection on 10/22/2020.  Patient asymptomatic, can discontinue airborne/contact precautions at this time; as it is greater than 10 days from initial diagnosis.  DVT prophylaxis: SCDs, chemical DVT prophylaxis contraindicated in setting of acute GI bleed   Code Status: Full Code Family Communication: Updated patient at bedside  Disposition Plan:  Level of care: Telemetry Status is: Inpatient  Remains inpatient appropriate because:Ongoing active pain requiring inpatient pain management, Ongoing diagnostic testing needed not appropriate for outpatient work up, Unsafe d/c plan, IV treatments appropriate due to intensity of illness or inability to take PO and Inpatient level of care appropriate due to severity of illness   Dispo: The patient is from: Home              Anticipated d/c is to: To be determined, home with home health versus SNF              Anticipated d/c date is: 2 days              Patient currently is not medically stable to d/c.   Difficult to place patient No  Consultants:   [facility], Dr. Michail Sermon  Orthopedics, Dr. Lorin Mercy  Procedures:   EGD 2/3  Antimicrobials:   None   Subjective: Patient seen and examined  bedside, resting comfortably.  No specific complaints this morning.  Nursing present at bedside.  Hemoglobin stable, although at 7.0 so we will transfuse 1 additional unit today.  GI with no further recommendations other than continue Protonix drip x72 hours followed by Protonix 40 IV twice daily and then transition to oral on discharge.  Awaiting PT/OT evaluation given touchdown weightbearing status secondary to fibular fracture. Denies headache, no fever/chills/night sweats, no chest pain, palpitations, no abdominal pain, no paresthesias.  No acute events overnight per nurse staff.  Objective: Vitals:   11/10/20 0820 11/10/20 0839 11/10/20 0900 11/10/20 1135  BP: (!) 145/64 (!) 145/57 (!) 136/56 (!) 134/56  Pulse: 75 75 76 77  Resp:  '16 20 18  '$ Temp:  (!) 97.5 F (36.4 C) 97.9 F (36.6 C) 98.5 F (36.9 C)  TempSrc:  Oral Oral Oral  SpO2:  98% 94% 95%  Weight:      Height:        Intake/Output Summary (Last 24 hours) at 11/10/2020 1159 Last data filed at 11/10/2020 1135 Gross per 24 hour  Intake 961.36 ml  Output 1950 ml  Net -988.64 ml   Filed Weights   11/09/20 0548 11/09/20 1207 11/10/20 0500  Weight: 121 kg 116.1 kg 123.2 kg    Examination:  General exam: Appears calm and comfortable, chronically ill in appearance; appears older than stated age Respiratory system: Clear to auscultation. Respiratory effort normal.  On  2 L nasal cannula with SPO2 100% Cardiovascular system: S1 & S2 heard, RRR. No JVD, murmurs, rubs, gallops or clicks. No pedal edema.  Noted tunneled HD catheter in place Gastrointestinal system: Abdomen is nondistended, soft and nontender. No organomegaly or masses felt. Normal bowel sounds heard. Central nervous system: Alert and oriented. No focal neurological deficits. Extremities: Symmetric 5 x 5 power. Skin: No rashes, lesions or ulcers Psychiatry: Judgement and insight appear poor. Mood & affect appropriate.     Data Reviewed: I have personally reviewed  following labs and imaging studies  CBC: Recent Labs  Lab 11/08/20 0743 11/08/20 1311 11/09/20 0328 11/09/20 0913 11/09/20 1505 11/09/20 2059 11/10/20 0226  WBC 8.6 7.8 7.9 7.2  --   --  5.7  NEUTROABS 6.4  --  5.3 4.8  --   --  4.6  HGB 4.0* 4.9* 6.9* 7.3* 8.1* 7.2* 7.0*  7.0*  HCT 13.7* 15.8* 21.5* 22.2* 24.6* 22.2* 21.8*  22.4*  MCV 97.2 94.6 93.9 92.5  --   --  93.7  PLT 272 227 198 185  --   --  123456   Basic Metabolic Panel: Recent Labs  Lab 11/08/20 0743 11/09/20 0328 11/10/20 0226  NA 139 141 139  K 4.6 4.7 5.5*  CL 101 103 103  CO2 '26 24 24  '$ GLUCOSE 167* 179* 333*  BUN 67* 76* 74*  CREATININE 2.90* 2.91* 2.88*  CALCIUM 7.4* 6.9* 7.1*  MG  --  2.0  --   PHOS  --  5.0*  --    GFR: Estimated Creatinine Clearance: 26.1 mL/min (A) (by C-G formula based on SCr of 2.88 mg/dL (H)). Liver Function Tests: Recent Labs  Lab 11/08/20 0743 11/09/20 0328 11/10/20 0226  AST 16 12* 13*  ALT '15 14 13  '$ ALKPHOS 58 50 51  BILITOT 0.4 0.6 0.5  PROT 6.0* 5.1* 5.6*  ALBUMIN 2.3* 2.3* 2.4*   No results for input(s): LIPASE, AMYLASE in the last 168 hours. No results for input(s): AMMONIA in the last 168 hours. Coagulation Profile: Recent Labs  Lab 11/08/20 0743  INR 1.2   Cardiac Enzymes: No results for input(s): CKTOTAL, CKMB, CKMBINDEX, TROPONINI in the last 168 hours. BNP (last 3 results) No results for input(s): PROBNP in the last 8760 hours. HbA1C: No results for input(s): HGBA1C in the last 72 hours. CBG: Recent Labs  Lab 11/09/20 1153 11/09/20 1639 11/09/20 2145 11/10/20 0724 11/10/20 1125  GLUCAP 136* 334* 366* 295* 269*   Lipid Profile: Recent Labs    11/08/20 0743  TRIG 171*   Thyroid Function Tests: No results for input(s): TSH, T4TOTAL, FREET4, T3FREE, THYROIDAB in the last 72 hours. Anemia Panel: Recent Labs    11/08/20 0743 11/09/20 0328  FERRITIN 621* 393*   Sepsis Labs: Recent Labs  Lab 11/08/20 0743  PROCALCITON 0.26     No results found for this or any previous visit (from the past 240 hour(s)).       Radiology Studies: CT ABDOMEN PELVIS WO CONTRAST  Result Date: 11/08/2020 CLINICAL DATA:  Abdominal pain.  Recent admission for COVID-19. EXAM: CT ABDOMEN AND PELVIS WITHOUT CONTRAST TECHNIQUE: Multidetector CT imaging of the abdomen and pelvis was performed following the standard protocol without IV contrast. COMPARISON:  CT AP 05/01/2017 FINDINGS: Lower chest: Scattered, irregular pulmonary opacities are identified within the right middle lobe, lingula and both lower lobes compatible with sequelae of recent COVID-19 pneumonia. No pleural effusion. Hepatobiliary: No focal liver abnormality is seen. No gallstones, gallbladder  wall thickening, or biliary dilatation. Pancreas: Mild diffuse fatty infiltration of the pancreas. No pancreatic inflammation, main duct dilatation or mass noted Spleen: Normal in size without focal abnormality. Adrenals/Urinary Tract: Normal appearance of the adrenal glands. No kidney mass or hydronephrosis identified. No kidney stones. No hydroureter or ureteral lithiasis. Urinary bladder is unremarkable. Stomach/Bowel: Stomach appears normal. The appendix is visualized and appears normal. No bowel wall thickening, inflammation or distension. Sigmoid diverticulosis noted without signs of acute inflammation. Vascular/Lymphatic: Aortic atherosclerosis. No abdominopelvic adenopathy identified. Reproductive: Status post hysterectomy. No adnexal masses. Other: No free fluid or fluid collections. Fat containing umbilical hernia noted. Musculoskeletal: Skin thickening and subcutaneous soft tissue stranding noted involving the patient's pannus. No focal body wall fluid collection identified to suggest abscess. Degenerative disc disease noted at L5-S1. IMPRESSION: 1. No acute findings identified within the abdomen or pelvis. 2. Skin thickening and subcutaneous soft tissue stranding involving the patient's  pannus. Correlate for any signs of cellulitis. 3. Scattered, irregular pulmonary opacities are identified within the right middle lobe, lingula and both lower lobes compatible with sequelae of recent COVID-19 pneumonia. 4. Aortic atherosclerosis. Aortic Atherosclerosis (ICD10-I70.0). Electronically Signed   By: Kerby Moors M.D.   On: 11/08/2020 14:25   VAS [place] LOWER EXTREMITY VENOUS (DVT)  Result Date: 11/08/2020  Lower Venous DVT Study Indications: Covid+ elevated d-dimer.  Limitations: Body habitus and poor ultrasound/tissue interface. Comparison Study: Previous exam 06/08/2017 - negative Performing Technologist: Rogelia Rohrer  Examination Guidelines: A complete evaluation includes B-mode imaging, spectral Doppler, color Doppler, and power Doppler as needed of all accessible portions of each vessel. Bilateral testing is considered an integral part of a complete examination. Limited examinations for reoccurring indications may be performed as noted. The reflux portion of the exam is performed with the patient in reverse Trendelenburg.  +---------+---------------+---------+-----------+----------+-------------------+ RIGHT    CompressibilityPhasicitySpontaneityPropertiesThrombus Aging      +---------+---------------+---------+-----------+----------+-------------------+ CFV      Full           Yes      Yes                                      +---------+---------------+---------+-----------+----------+-------------------+ SFJ      Full                                                             +---------+---------------+---------+-----------+----------+-------------------+ FV Prox  Full           Yes      Yes                                      +---------+---------------+---------+-----------+----------+-------------------+ FV Mid   Full           Yes      Yes                                      +---------+---------------+---------+-----------+----------+-------------------+ FV  DistalFull           Yes      Yes                                      +---------+---------------+---------+-----------+----------+-------------------+  PFV      Full                                                             +---------+---------------+---------+-----------+----------+-------------------+ POP      Full           Yes      Yes                                      +---------+---------------+---------+-----------+----------+-------------------+ PTV      Full                                                             +---------+---------------+---------+-----------+----------+-------------------+ PERO     Full                                         Not well visualized +---------+---------------+---------+-----------+----------+-------------------+   +---------+---------------+---------+-----------+----------+-------------------+ LEFT     CompressibilityPhasicitySpontaneityPropertiesThrombus Aging      +---------+---------------+---------+-----------+----------+-------------------+ CFV      Full           Yes      Yes                                      +---------+---------------+---------+-----------+----------+-------------------+ SFJ      Full                                                             +---------+---------------+---------+-----------+----------+-------------------+ FV Prox  Full           Yes      Yes                                      +---------+---------------+---------+-----------+----------+-------------------+ FV Mid   Full           Yes      Yes                                      +---------+---------------+---------+-----------+----------+-------------------+ FV DistalFull           Yes      Yes                                      +---------+---------------+---------+-----------+----------+-------------------+ PFV      Full                                                              +---------+---------------+---------+-----------+----------+-------------------+  POP      Full           Yes      Yes                                      +---------+---------------+---------+-----------+----------+-------------------+ PTV      Full                                                             +---------+---------------+---------+-----------+----------+-------------------+ PERO     Full                                         Not well visualized +---------+---------------+---------+-----------+----------+-------------------+     Summary: BILATERAL: - No evidence of deep vein thrombosis seen in the lower extremities, bilaterally. -No evidence of popliteal cyst, bilaterally.   *See table(s) above for measurements and observations. Electronically signed by Harold Barban MD on 11/08/2020 at 7:13:26 PM.    Final         Scheduled Meds: . vitamin C  500 mg Oral Daily  . atorvastatin  40 mg Oral Daily  . carvedilol  25 mg Oral BID WC  . furosemide  80 mg Oral Daily  . insulin aspart  0-15 Units Subcutaneous TID WC  . insulin aspart  0-5 Units Subcutaneous QHS  . insulin glargine  10 Units Subcutaneous Daily  . [START ON 11/11/2020] pantoprazole  40 mg Intravenous Q12H  . zinc sulfate  220 mg Oral Daily   Continuous Infusions: . pantoprozole (PROTONIX) infusion 8 mg/hr (11/10/20 0903)     LOS: 2 days    Time spent: 38 minutes spent on chart review, discussion with nursing staff, consultants, updating family and interview/physical exam; more than 50% of that time was spent in counseling and/or coordination of care.    Edgardo Petrenko J [place] DO [facility] Available via Epic secure chat 7am-7pm After these hours, please refer to coverage provider listed on [email or web address] 11/10/2020, 11:59 AM

## 2020-11-10 NOTE — Progress Notes (Signed)
Occupational Therapy Evaluation  Patient with recent hospitalization at [facility] with Latasha [name], reports since being home has needed increased assistance from DTR for ADLs including dressing, bathing "I just had no energy." Pt also reports working with [facility] therapy and they have started the process for ordering a w/c for home use. Currently patient min A for bed mobility, set up for UB ADL and max to total A for LB ADLs. Deferred attempt at transfer this session due to frequent loose dark red stools, RN made aware. Recommend continued acute OT services to maximize patient endurance, safety, balance in order to reduce caregiver burden and D/C home with [facility] + family support.     11/10/20 1419  OT Visit Information  Last OT Received On 11/10/20  Assistance Needed +2  PT/OT/SLP Co-Evaluation/Treatment Yes  Reason for Co-Treatment For patient/therapist safety;To address functional/ADL transfers  OT goals addressed during session ADL's and self-care  History of Present Illness Latasha [name] is a 65 y.o. female with medical history significant of HFpEF, CKD IV, DM2, HTN, morbid obesity. DC from [place] with covid on. On 11/08/2020,Presenting with dark stools and multiple falls, right ankle pain.Hgb noted to be 4.0. FOBT positive. XR right ankle: Nondisplaced fracture distal fibula  Precautions  Precautions Fall  Precaution Comments cam boot right, frequent dark loose stools.  Restrictions  Weight Bearing Restrictions Yes  RLE Weight Bearing TWB  Other Position/Activity Restrictions per ortho consult. Cam boot  Home Living  Family/patient expects to be discharged to: Private residence  Living Arrangements Spouse/significant other  Available Help at Discharge Family;Available 24 hours/day  Type of Home House  Home Access Stairs to enter  Entrance Stairs-Number of Steps 3  Entrance Stairs-Rails Right;Left;Can reach both  Home Layout One level  Bathroom Shower/Tub Tub/shower unit  [employer]  Handicapped height (BSC or toilet riser over toilet)  [place] - 2 wheels;Walker - 4 wheels;Cane - single point;Shower seat;Adaptive equipment  Adaptive Equipment Reacher;Sock aid  Additional Comments has been limited distance due to weakness. HHPT was ordering a WC  Prior Function  Level of [place] Needs assistance  Gait / Transfers Assistance Needed using rollator until fall  ADL's / Homemaking Assistance Needed patient reports due to COVID and kidney issues since being home from [facility] D/C in Jan 2022 has been needing assistance from DTR for dressing, bathing  Communication  Communication No difficulties  Pain Assessment  Pain Assessment Faces  Faces Pain Scale 2  Pain Location stomach  Pain Descriptors / Indicators Tightness;Discomfort  Pain Intervention(s) Monitored during session  Cognition  Arousal/Alertness Awake/alert  Behavior During Therapy WFL for tasks assessed/performed  Overall Cognitive Status Within Functional Limits for tasks assessed  Upper Extremity Assessment  Upper Extremity Assessment Overall WFL for tasks assessed  Lower Extremity Assessment  Lower Extremity Assessment Defer to PT evaluation  Cervical / Trunk Assessment  Cervical / Trunk Assessment Other exceptions  Cervical / Trunk Exceptions pt reports h/o low back arthritis,  ADL  Overall ADL's  Needs assistance/impaired  Eating/Feeding Sitting;Independent  Grooming Oral care;Wash/dry face;Set up;Sitting  Upper Body Bathing Set up;Sitting  Lower Body Bathing Maximal assistance;Sitting/lateral leans  Upper Body Dressing  Set up;Sitting  Lower Body Dressing Total assistance;Sitting/lateral leans  Toilet Transfer Details (indicate cue type and reason) deferred due to frequent bloody stools  Toileting- Clothing Manipulation and Hygiene Total assistance;Bed level  Toileting - Clothing Manipulation Details (indicate cue type and reason) pt with large liquid bloody stool with RN notified, total  A  for perianal care  General ADL Comments patient requiring increased assistance for self care tasks due to decreased activity tolerance, pain and WB restrictions  Bed Mobility  Overal bed mobility Needs Assistance  Bed Mobility Rolling;Supine to Sit;Sit to Supine  Rolling Min guard  Supine to sit Min assist;HOB elevated  Sit to supine Min assist  General bed mobility comments use of bed rail for rolling, Gentle min assist to sit up. Min assist with legs back onto bed.  Transfers  General transfer comment TBA, having dark stools  Balance  Overall balance assessment Needs assistance  Sitting-balance support No upper extremity supported;Feet supported  Sitting balance-Leahy Scale Fair  Standing balance comment NT  OT - End of Session  Activity Tolerance Patient tolerated treatment well  Patient left in bed;with call bell/phone within reach  Nurse Communication Other (comment) (dark loose stool)  OT Assessment  OT Recommendation/Assessment Patient needs continued OT Services  OT Visit Diagnosis Other abnormalities of gait and mobility (R26.89);History of falling (Z91.81);Pain  Pain - part of body  (abdomen)  OT Problem List Decreased activity tolerance;Impaired balance (sitting and/or standing);Decreased safety awareness;Decreased knowledge of use of DME or AE;Obesity;Pain  OT Plan  OT Frequency (ACUTE ONLY) Min 2X/week  OT Treatment/Interventions (ACUTE ONLY) Self-care/ADL training;Therapeutic exercise;Energy conservation;DME and/or AE instruction;Therapeutic activities;Patient/family education;Balance training  AM-PAC OT "6 Clicks" Daily Activity Outcome Measure (Version 2)  Help from another person eating meals? 4  Help from another person taking care of personal grooming? 3  Help from another person toileting, which includes using toliet, bedpan, or urinal? 1  Help from another person bathing (including washing, rinsing, drying)? 2  Help from another person to put on and taking  off regular upper body clothing? 3  Help from another person to put on and taking off regular lower body clothing? 1  6 Click Score 14  OT Recommendation  Follow Up Recommendations Home health OT;Supervision/Assistance - 24 hour  OT Equipment None recommended by OT  Individuals Consulted  Consulted and Agree with Results and Recommendations Patient  Acute Rehab OT Goals  Patient Stated Goal be able to get better and go home, recover at home  OT Goal Formulation With patient  Time For Goal Achievement 11/24/20  Potential to Achieve Goals Good  OT Time Calculation  OT Start Time (ACUTE ONLY) 1233  OT Stop Time (ACUTE ONLY) 1320  OT Time Calculation (min) 47 min  OT General Charges  $OT Visit 1 Visit  OT Evaluation  $OT Eval Moderate Complexity 1 Mod  Written Expression  Dominant Hand Right   Delbert Phenix OT OT pager: [phone]

## 2020-11-11 DIAGNOSIS — K922 Gastrointestinal hemorrhage, unspecified: Secondary | ICD-10-CM | POA: Diagnosis not present

## 2020-11-11 DIAGNOSIS — I5032 Chronic diastolic (congestive) heart failure: Secondary | ICD-10-CM | POA: Diagnosis not present

## 2020-11-11 DIAGNOSIS — J9601 Acute respiratory failure with hypoxia: Secondary | ICD-10-CM | POA: Diagnosis not present

## 2020-11-11 DIAGNOSIS — N184 Chronic kidney disease, stage 4 (severe): Secondary | ICD-10-CM | POA: Diagnosis not present

## 2020-11-11 LAB — BPAM RBC
Blood Product Expiration Date: 202202202359
Blood Product Expiration Date: 202202212359
Blood Product Expiration Date: 202202212359
Blood Product Expiration Date: 202202222359
Blood Product Expiration Date: 202202222359
Blood Product Expiration Date: 202202242359
ISSUE DATE / TIME: 202202021034
ISSUE DATE / TIME: 202202021304
ISSUE DATE / TIME: 202202021542
ISSUE DATE / TIME: 202202021934
ISSUE DATE / TIME: 202202030515
ISSUE DATE / TIME: 202202040847
Unit Type and Rh: 6200
Unit Type and Rh: 6200
Unit Type and Rh: 6200
Unit Type and Rh: 6200
Unit Type and Rh: 6200
Unit Type and Rh: 6200

## 2020-11-11 LAB — COMPREHENSIVE METABOLIC PANEL
ALT: 15 U/L (ref 0–44)
AST: 15 U/L (ref 15–41)
Albumin: 2.7 g/dL — ABNORMAL LOW (ref 3.5–5.0)
Alkaline Phosphatase: 55 U/L (ref 38–126)
Anion gap: 7 (ref 5–15)
BUN: 69 mg/dL — ABNORMAL HIGH (ref 8–23)
CO2: 25 mmol/L (ref 22–32)
Calcium: 7.2 mg/dL — ABNORMAL LOW (ref 8.9–10.3)
Chloride: 101 mmol/L (ref 98–111)
Creatinine, Ser: 2.97 mg/dL — ABNORMAL HIGH (ref 0.44–1.00)
GFR, Estimated: 17 mL/min — ABNORMAL LOW (ref >60)
Glucose, Bld: 220 mg/dL — ABNORMAL HIGH (ref 70–99)
Potassium: 4.8 mmol/L (ref 3.5–5.1)
Sodium: 133 mmol/L — ABNORMAL LOW (ref 135–145)
Total Bilirubin: 0.3 mg/dL (ref 0.3–1.2)
Total Protein: 5.7 g/dL — ABNORMAL LOW (ref 6.5–8.1)

## 2020-11-11 LAB — TYPE AND SCREEN
ABO/RH(D): A POS
Antibody Screen: NEGATIVE
Unit division: 0
Unit division: 0
Unit division: 0
Unit division: 0
Unit division: 0
Unit division: 0

## 2020-11-11 LAB — GLUCOSE, CAPILLARY
Glucose-Capillary: 226 mg/dL — ABNORMAL HIGH (ref 70–99)
Glucose-Capillary: 198 mg/dL — ABNORMAL HIGH (ref 70–99)
Glucose-Capillary: 205 mg/dL — ABNORMAL HIGH (ref 70–99)
Glucose-Capillary: 239 mg/dL — ABNORMAL HIGH (ref 70–99)

## 2020-11-11 LAB — CBC WITH DIFFERENTIAL/PLATELET
Abs Immature Granulocytes: 0.11 10*3/uL — ABNORMAL HIGH (ref 0.00–0.07)
Basophils Absolute: 0 10*3/uL (ref 0.0–0.1)
Basophils Relative: 0 %
Eosinophils Absolute: 0.1 10*3/uL (ref 0.0–0.5)
Eosinophils Relative: 1 %
HCT: 24.5 % — ABNORMAL LOW (ref 36.0–46.0)
Hemoglobin: 7.9 g/dL — ABNORMAL LOW (ref 12.0–15.0)
Immature Granulocytes: 2 %
Lymphocytes Relative: 20 %
Lymphs Abs: 1.2 10*3/uL (ref 0.7–4.0)
MCH: 30.4 pg (ref 26.0–34.0)
MCHC: 32.2 g/dL (ref 30.0–36.0)
MCV: 94.2 fL (ref 80.0–100.0)
Monocytes Absolute: 0.5 10*3/uL (ref 0.1–1.0)
Monocytes Relative: 9 %
Neutro Abs: 4 10*3/uL (ref 1.7–7.7)
Neutrophils Relative %: 68 %
Platelets: 230 10*3/uL (ref 150–400)
RBC: 2.6 MIL/uL — ABNORMAL LOW (ref 3.87–5.11)
RDW: 16.9 % — ABNORMAL HIGH (ref 11.5–15.5)
WBC: 5.9 10*3/uL (ref 4.0–10.5)
nRBC: 0 % (ref 0.0–0.2)

## 2020-11-11 MED ORDER — INSULIN GLARGINE 100 UNIT/ML ~~LOC~~ SOLN
10.0000 [IU] | Freq: Two times a day (BID) | SUBCUTANEOUS | Status: DC
  Administered 2020-11-11 – 2020-11-12 (×2): 10 [IU] via SUBCUTANEOUS
  Filled 2020-11-11 (×2): qty 0.1

## 2020-11-11 NOTE — Progress Notes (Signed)
Physical Therapy Treatment Patient Details Name: [name] MRN: [id number] DOB: 07/01/1956 Today's Date: 11/11/2020    History of Present Illness Latasha [name] is a 65 y.o. female with medical history significant of HFpEF, CKD IV, DM2, HTN, morbid obesity. DC from [place] with covid on. On 11/08/2020,Presenting with dark stools and multiple falls, right ankle pain.Hgb noted to be 4.0. FOBT positive. XR right ankle: Nondisplaced fracture distal fibula    PT Comments    Patient progressing to OOB this session.  She was able to stand, but could not keep weight off the R LE for and attempt to transfer to chair so returned to EOB and performed lateral scoot to w/c with A.  She will not be able to traverse steps for home entry and no ramp in place so will need ambulance transport home.  She is hopeful to regain her strength so propelled today w/c in the hallway.   Discussed performing sit to stand at home as well to ensure legs don't completely lose their strength, but to limit that since she needs to heal her R foot prior to attempting to take steps.  She reports family supportive and daughter to take leave since her husband still works.  Feel she will need HHaide, HHPT and HHOT if possible.  PT to follow up if not d.c.   Follow Up Recommendations  Home health PT     Equipment Recommendations  Other (comment);3in1 (PT);Wheelchair (measurements PT);Wheelchair cushion (measurements PT) ([place] aide; drop arm bariatric 3:1; 20x18 (at least) w/c)    Recommendations for Other Services       Precautions / Restrictions Precautions Precautions: Fall Required Braces or Orthoses: Other Brace Other Brace: camboot on R Restrictions RLE Weight Bearing: Touchdown weight bearing    Mobility  Bed Mobility Overal bed mobility: Needs Assistance Bed Mobility: Rolling;Supine to Sit Rolling: Supervision   Supine to sit: Min assist Sit to supine: Min assist   General bed mobility comments: some assist for  trunk as struggling to get up with HOB elevated and rails; after return to supine assist for hygiene and pt able to roll with rail unaided  Transfers Overall transfer level: Needs assistance Equipment used: Rolling walker (2 wheeled) Transfers: Sit to/from Stand;Lateral/Scoot Transfers Sit to Stand: Mod assist        Lateral/Scoot Transfers: Min assist General transfer comment: sit to stand to RW with increased time, some lifting help and pt unable to keep R LE TDWB so returned to seated EOB and assisted with lateral scoot transfer to the w/c.  Patient assisted back to bed with scoot pivot pt placing some weight on feet due to higher surface  Ambulation/Gait             General Gait Details: NT due to TDWB on R   Theme park manager mobility: Yes Wheelchair propulsion: Both upper extremities Wheelchair parts: Needs assistance Distance: 29' with stops to rest Wheelchair Assistance Details (indicate cue type and reason): mod cues for negotiating obstacles in hallway and for turning to return to room  Modified Rankin (Stroke Patients Only)       Balance Overall balance assessment: Needs assistance   Sitting balance-Leahy Scale: Good     Standing balance support: Bilateral upper extremity supported Standing balance-Leahy Scale: Poor Standing balance comment: unable to stand without UE support and unable to keep R TDWB  Cognition Arousal/Alertness: Awake/alert Behavior During Therapy: WFL for tasks assessed/performed Overall Cognitive Status: Within Functional Limits for tasks assessed                                        Exercises      General Comments General comments (skin integrity, edema, etc.): SpO2 at rest on RA initially 87%, checked with different probe and noted 91%; no SOB noted, but pt with edema in LE's and pt/spouse worried about excessive  fluid      Pertinent Vitals/Pain Pain Assessment: Faces Faces Pain Scale: Hurts a little bit Pain Location: R foot with weight bearing Pain Descriptors / Indicators: Sore Pain Intervention(s): Monitored during session;Repositioned    Home Living                      Prior Function            PT Goals (current goals can now be found in the care plan section) Progress towards PT goals: Progressing toward goals    Frequency    Min 3X/week      PT Plan Current plan remains appropriate;Equipment recommendations need to be updated    Co-evaluation              AM-PAC PT "6 Clicks" Mobility   Outcome Measure  Help needed turning from your back to your side while in a flat bed without using bedrails?: A Little Help needed moving from lying on your back to sitting on the side of a flat bed without using bedrails?: A Little Help needed moving to and from a bed to a chair (including a wheelchair)?: A Little Help needed standing up from a chair using your arms (e.g., wheelchair or bedside chair)?: A Lot Help needed to walk in hospital room?: Total Help needed climbing 3-5 steps with a railing? : Total 6 Click Score: 13    End of Session Equipment Utilized During Treatment: Gait belt Activity Tolerance: Patient tolerated treatment well Patient left: in bed;with call bell/phone within reach   PT Visit Diagnosis: Muscle weakness (generalized) (M62.81);Difficulty in walking, not elsewhere classified (R26.2);Pain Pain - Right/Left: Right Pain - part of body: Ankle and joints of foot     Time: 1540-1620 PT Time Calculation (min) (ACUTE ONLY): 40 min  Charges:  $Therapeutic Activity: 23-37 mins $Wheel Chair Management: 8-22 mins                     Magda Kiel, PT Acute Rehabilitation Services Pager:[phone] [phone] 11/11/2020    Reginia Naas 11/11/2020, 6:08 PM

## 2020-11-11 NOTE — TOC Transition Note (Signed)
Transition of Care [facility]) - CM/SW Discharge Note   Patient Details  Name: Latasha [name] MRN: [id number] Date of Birth: [birth year]  [facility]) CM/SW Contact:  Servando Snare, LCSW Phone Number: 11/11/2020, 12:19 PM   Clinical Narrative:   Patient to dc home tomorrow. DME has been ordered and patient will resume HH with [facility] at dc.    Final next level of care: [place] Barriers to Discharge: No Barriers Identified   Patient Goals and CMS Choice Patient states their goals for this hospitalization and ongoing recovery are:: Go home CMS Medicare.gov Compare Post Acute Care list provided to:: Patient Choice offered to / list presented to : Patient  Discharge Placement                       Discharge Plan and Services                DME Arranged: Wheelchair manual DME Agency: [employer] Date DME Agency Contacted: 11/11/20 Time DME Agency Contacted: 1218 Representative spoke with at DME Agency: Adela Lank HH Arranged: PT,OT,Social Work,RN,Nurse's Aide [place]: [facility] Date [place]: 11/11/20 Time [place]: 1219 Representative spoke with at [place]: [place]) Interventions     Readmission Risk Interventions No flowsheet data found.

## 2020-11-11 NOTE — Progress Notes (Signed)
PROGRESS NOTE    KAZMIRA HARMENING  [id number] DOB: 01-Apr-1956 DOA: 11/08/2020 PCP: Shirline Frees, MD    Brief Narrative:  Latasha [name] is a 65 year old female with past medical history significant for chronic diastolic congestive heart failure, CKD stage IV, type 2 diabetes mellitus, essential hypertension, morbid obesity who presented to the ED with 2-day history of dark stools.  Patient also reports fall 2 days ago without head injury or loss of consciousness.  Patient also complains of progressive weakness and fatigue.  She fell once again last night and was unable to get up.  She was then transported to the ED for further evaluation.  In the ED, temperature 98.5, HR 77, RR 24, BP 118/48, SPO2 100% on 4 L nasal cannula.  Sodium 139, potassium 4.6, chloride 101, CO2 26, glucose 167, BUN 67, creatinine 2.90.  Troponin 8.  BNP 133.4.  CRP 6.5, D-dimer 2.31.  Procalcitonin 0.26.  WBC 8.6, hemoglobin 4.0, platelets 272.  INR 1.2.  FOBT positive.  Chest x-ray with question of diffuse bilateral airspace disease, symmetric and may represent edema versus pneumonia, progressive left lower lobe consolidation.  Right ankle x-ray notable for nondisplaced fracture distal fibula.  CT abdomen/pelvis without contrast with no acute findings, skin thickening/subcutaneous soft tissue stranding pannus, scattered irregular pulmonary opacities within the right middle lobe, lingula and lower lobes compatible with sequelae of recent Covid-19 pneumonia.  Orthopedics and [facility] consult.  Hospital service consulted for further evaluation and management of acute symptomatic anemia secondary to upper GI bleed and right distal fibular fracture.   Assessment & Plan:   Principal Problem:   GIB (gastrointestinal bleeding) Active Problems:   Insulin-requiring or dependent type II diabetes mellitus ([place])   Essential hypertension   GASTROESOPHAGEAL REFLUX DISEASE   Hyperlipemia   Chronic diastolic CHF (congestive heart  failure) (HCC)   Acute respiratory failure with hypoxia (HCC)   CKD (chronic kidney disease) stage 4, GFR 15-29 ml/min (HCC)   Fibula fracture   Acute symptomatic anemia Upper GI bleed secondary to acute gastritis with duodenal ulcers Patient presenting to the ED with 2-day history of progressive fatigue, weakness and dark stools.  Hemoglobin on admission 4.0. [facility] was consulted and underwent EGD on 11/09/2020 by Dr. Michail Sermon with findings of erosions/erythema gastric antrum, nonobstructing nonbleeding cratered duodenal ulcers, erythema and ulceration duodenal bulb and biopsy taken. --Hgb 4.0>6.9>7.3>8.1>7.2>7.0>7.0>7.7>7.9 --s/p 5u pRBC; transfuse 1u pRBC today --Protonix 40 mg IV every 12 hours --surgical pathology from EGD biopsy: pending --Advance diet to heart healthy/consistent carbohydrate diet today --Transfuse for hemoglobin less than 7.0 --Outpatient follow-up with [facility] in 6-8 weeks, will need repeat CBC 2 weeks after discharge with PCP/GI, plan protonix '40mg'$  PO BID x 1 month followed by daily dosing  Acute hypoxic respiratory failure Patient presenting with hypoxia, secondary to severe anemia with a hemoglobin of 4.0.  Initially requiring 4 L nasal cannula. --Transfuse as above --Continue to titrate supplemental oxygen, maintain SPO2 greater than 92%, now on 2 L nasal cannula with SPO2 100%.  Acute distal right fibular fracture X-ray right ankle notable for nondisplaced fracture distal fibula.  Seen by orthopedics, recommend placement of a CAM boot.  Touchdown weightbearing.  Outpatient follow-up with Dr. Lorin Mercy. --PT/OT recommend home health, wheelchair; [facility] to assistance  CKD stage IV Patient follows with nephrology outpatient, Dr. Johnney Ou.  Recently had a tunneled HD catheter placed on 10/26/2020 for anticipated need for dialysis in the near future. --Cr 2.90>2.91>2.88>2.97 (2.82 on 1/26) --Avoid nephrotoxins, renal  dose all medications --Follow BMP daily  Type 2  diabetes mellitus Home regimen includes insulin 70/30 60u [place] qAM and 50u SQ qPM.  --Lantus 10 units subcutaneously BID --Moderate SSI for coverage --CBGs before every meal/at bedtime  Chronic diastolic congestive heart failure, compensated Includes carvedilol 25 mg twice daily, furosemide 80 mg p.o. daily. --Carvedilol 25 mg twice daily --Furosemide 80 mg p.o. daily --Strict I's and O's and daily weights  HLD: Continue atorvastatin 40 mg p.o. daily  Morbid obesity Body mass index is 46.01 kg/m.  Discussed with patient needs for aggressive lifestyle changes/weight loss as this complicates all facets of care.  Outpatient follow-up with PCP.  May benefit from bariatric evaluation outpatient.  Hx COVID infection  Patient diagnosed with Covid-19 viral infection on 10/22/2020.  Patient asymptomatic, can discontinue airborne/contact precautions at this time; as it is greater than 10 days from initial diagnosis.  DVT prophylaxis: SCDs, chemical DVT prophylaxis contraindicated in setting of acute GI bleed   Code Status: Full Code Family Communication: Updated patient at bedside  Disposition Plan:  Level of care: Telemetry Status is: Inpatient  Remains inpatient appropriate because:Ongoing active pain requiring inpatient pain management, Ongoing diagnostic testing needed not appropriate for outpatient work up, Unsafe d/c plan, IV treatments appropriate due to intensity of illness or inability to take PO and Inpatient level of care appropriate due to severity of illness   Dispo: The patient is from: Home              Anticipated d/c is to: Home with [facility]              Anticipated d/c date is: 1 day              Patient currently is not medically stable to d/c.   Difficult to place patient No  Consultants:   [facility], Dr. Michail Sermon  Orthopedics, Dr. Lorin Mercy  Procedures:   EGD 2/3  Antimicrobials:   None   Subjective: Patient seen and examined bedside, resting comfortably.   Hemoglobin remained stable.  Transitioned off of Protonix drip overnight to twice daily dosing IV.  Discussed with patient likely discharge home tomorrow if hemoglobin remains stable with home health.  No other questions or concerns at this time. Denies headache, no fever/chills/night sweats, no chest pain, palpitations, no abdominal pain, no paresthesias.  No acute events overnight per nurse staff.  Objective: Vitals:   11/10/20 1742 11/10/20 2029 11/11/20 0431 11/11/20 0500  BP: (!) 148/58 (!) 136/58 (!) 157/68   Pulse: 81 75 79   Resp:  18 18   Temp:  98.3 F (36.8 C) 98.5 F (36.9 C)   TempSrc:  Oral Oral   SpO2:  95% 93%   Weight:    123.5 kg  Height:        Intake/Output Summary (Last 24 hours) at 11/11/2020 1116 Last data filed at 11/11/2020 1045 Gross per 24 hour  Intake 819.9 ml  Output 1850 ml  Net -1030.1 ml   Filed Weights   11/09/20 1207 11/10/20 0500 11/11/20 0500  Weight: 116.1 kg 123.2 kg 123.5 kg    Examination:  General exam: Appears calm and comfortable, chronically ill in appearance; appears older than stated age Respiratory system: Clear to auscultation. Respiratory effort normal.  On room air. Cardiovascular system: S1 & S2 heard, RRR. No JVD, murmurs, rubs, gallops or clicks. No pedal edema.  Noted tunneled HD catheter in place Gastrointestinal system: Abdomen is nondistended, soft and nontender. No organomegaly or  masses felt. Normal bowel sounds heard. Central nervous system: Alert and oriented. No focal neurological deficits. Extremities: Symmetric 5 x 5 power. Skin: No rashes, lesions or ulcers Psychiatry: Judgement and insight appear poor. Mood & affect appropriate.     Data Reviewed: I have personally reviewed following labs and imaging studies  CBC: Recent Labs  Lab 11/08/20 0743 11/08/20 1311 11/09/20 0328 11/09/20 0913 11/09/20 1505 11/09/20 2059 11/10/20 0226 11/10/20 1350 11/10/20 2057 11/11/20 0453  WBC 8.6 7.8 7.9 7.2  --   --   5.7  --   --  5.9  NEUTROABS 6.4  --  5.3 4.8  --   --  4.6  --   --  4.0  HGB 4.0* 4.9* 6.9* 7.3*   < > 7.2* 7.0*  7.0* 7.7* 7.9* 7.9*  HCT 13.7* 15.8* 21.5* 22.2*   < > 22.2* 21.8*  22.4* 23.5* 24.5* 24.5*  MCV 97.2 94.6 93.9 92.5  --   --  93.7  --   --  94.2  PLT 272 227 198 185  --   --  211  --   --  230   < > = values in this interval not displayed.   Basic Metabolic Panel: Recent Labs  Lab 11/08/20 0743 11/09/20 0328 11/10/20 0226 11/11/20 0453  NA 139 141 139 133*  K 4.6 4.7 5.5* 4.8  CL 101 103 103 101  CO2 '26 24 24 25  '$ GLUCOSE 167* 179* 333* 220*  BUN 67* 76* 74* 69*  CREATININE 2.90* 2.91* 2.88* 2.97*  CALCIUM 7.4* 6.9* 7.1* 7.2*  MG  --  2.0  --   --   PHOS  --  5.0*  --   --    GFR: Estimated Creatinine Clearance: 25.4 mL/min (A) (by C-G formula based on SCr of 2.97 mg/dL (H)). Liver Function Tests: Recent Labs  Lab 11/08/20 0743 11/09/20 0328 11/10/20 0226 11/11/20 0453  AST 16 12* 13* 15  ALT '15 14 13 15  '$ ALKPHOS 58 50 51 55  BILITOT 0.4 0.6 0.5 0.3  PROT 6.0* 5.1* 5.6* 5.7*  ALBUMIN 2.3* 2.3* 2.4* 2.7*   No results for input(s): LIPASE, AMYLASE in the last 168 hours. No results for input(s): AMMONIA in the last 168 hours. Coagulation Profile: Recent Labs  Lab 11/08/20 0743  INR 1.2   Cardiac Enzymes: No results for input(s): CKTOTAL, CKMB, CKMBINDEX, TROPONINI in the last 168 hours. BNP (last 3 results) No results for input(s): PROBNP in the last 8760 hours. HbA1C: No results for input(s): HGBA1C in the last 72 hours. CBG: Recent Labs  Lab 11/10/20 0724 11/10/20 1125 11/10/20 1640 11/10/20 2031 11/11/20 0804  GLUCAP 295* 269* 321* 305* 226*   Lipid Profile: No results for input(s): CHOL, HDL, LDLCALC, TRIG, CHOLHDL, LDLDIRECT in the last 72 hours. Thyroid Function Tests: No results for input(s): TSH, T4TOTAL, FREET4, T3FREE, THYROIDAB in the last 72 hours. Anemia Panel: Recent Labs    11/09/20 0328  FERRITIN 393*   Sepsis  Labs: Recent Labs  Lab 11/08/20 0743  PROCALCITON 0.26    No results found for this or any previous visit (from the past 240 hour(s)).       Radiology Studies: No results found.      Scheduled Meds: . vitamin C  500 mg Oral Daily  . atorvastatin  40 mg Oral Daily  . carvedilol  25 mg Oral BID WC  . Chlorhexidine Gluconate Cloth  6 each Topical Daily  . furosemide  80 mg Oral Daily  . insulin aspart  0-15 Units Subcutaneous TID WC  . insulin aspart  0-5 Units Subcutaneous QHS  . insulin glargine  10 Units Subcutaneous Daily  . pantoprazole  40 mg Intravenous Q12H  . zinc sulfate  220 mg Oral Daily   Continuous Infusions:    LOS: 3 days    Time spent: 36 minutes spent on chart review, discussion with nursing staff, consultants, updating family and interview/physical exam; more than 50% of that time was spent in counseling and/or coordination of care.    Quadarius Henton J [place] DO [facility] Available via Epic secure chat 7am-7pm After these hours, please refer to coverage provider listed on [email or web address] 11/11/2020, 11:16 AM

## 2020-11-12 DIAGNOSIS — J9601 Acute respiratory failure with hypoxia: Secondary | ICD-10-CM | POA: Diagnosis not present

## 2020-11-12 DIAGNOSIS — K264 Chronic or unspecified duodenal ulcer with hemorrhage: Secondary | ICD-10-CM

## 2020-11-12 DIAGNOSIS — N184 Chronic kidney disease, stage 4 (severe): Secondary | ICD-10-CM | POA: Diagnosis not present

## 2020-11-12 DIAGNOSIS — I5032 Chronic diastolic (congestive) heart failure: Secondary | ICD-10-CM | POA: Diagnosis not present

## 2020-11-12 LAB — CBC WITH DIFFERENTIAL/PLATELET
Abs Immature Granulocytes: 0.06 10*3/uL (ref 0.00–0.07)
Basophils Absolute: 0 10*3/uL (ref 0.0–0.1)
Basophils Relative: 0 %
Eosinophils Absolute: 0.3 10*3/uL (ref 0.0–0.5)
Eosinophils Relative: 6 %
HCT: 25.2 % — ABNORMAL LOW (ref 36.0–46.0)
Hemoglobin: 8.2 g/dL — ABNORMAL LOW (ref 12.0–15.0)
Immature Granulocytes: 1 %
Lymphocytes Relative: 25 %
Lymphs Abs: 1.3 10*3/uL (ref 0.7–4.0)
MCH: 30.9 pg (ref 26.0–34.0)
MCHC: 32.5 g/dL (ref 30.0–36.0)
MCV: 95.1 fL (ref 80.0–100.0)
Monocytes Absolute: 0.5 10*3/uL (ref 0.1–1.0)
Monocytes Relative: 10 %
Neutro Abs: 3 10*3/uL (ref 1.7–7.7)
Neutrophils Relative %: 58 %
Platelets: 280 10*3/uL (ref 150–400)
RBC: 2.65 MIL/uL — ABNORMAL LOW (ref 3.87–5.11)
RDW: 16.8 % — ABNORMAL HIGH (ref 11.5–15.5)
WBC: 5.2 10*3/uL (ref 4.0–10.5)
nRBC: 0 % (ref 0.0–0.2)

## 2020-11-12 LAB — COMPREHENSIVE METABOLIC PANEL
ALT: 17 U/L (ref 0–44)
AST: 16 U/L (ref 15–41)
Albumin: 2.7 g/dL — ABNORMAL LOW (ref 3.5–5.0)
Alkaline Phosphatase: 61 U/L (ref 38–126)
Anion gap: 11 (ref 5–15)
BUN: 59 mg/dL — ABNORMAL HIGH (ref 8–23)
CO2: 26 mmol/L (ref 22–32)
Calcium: 7.5 mg/dL — ABNORMAL LOW (ref 8.9–10.3)
Chloride: 100 mmol/L (ref 98–111)
Creatinine, Ser: 2.8 mg/dL — ABNORMAL HIGH (ref 0.44–1.00)
GFR, Estimated: 18 mL/min — ABNORMAL LOW (ref >60)
Glucose, Bld: 194 mg/dL — ABNORMAL HIGH (ref 70–99)
Potassium: 4.7 mmol/L (ref 3.5–5.1)
Sodium: 137 mmol/L (ref 135–145)
Total Bilirubin: 0.5 mg/dL (ref 0.3–1.2)
Total Protein: 5.9 g/dL — ABNORMAL LOW (ref 6.5–8.1)

## 2020-11-12 LAB — GLUCOSE, CAPILLARY
Glucose-Capillary: 162 mg/dL — ABNORMAL HIGH (ref 70–99)
Glucose-Capillary: 162 mg/dL — ABNORMAL HIGH (ref 70–99)

## 2020-11-12 MED ORDER — PANTOPRAZOLE SODIUM 40 MG PO TBEC: DELAYED_RELEASE_TABLET | ORAL | 0 refills | Status: DC

## 2020-11-12 MED ORDER — TRAMADOL HCL 50 MG PO TABS: 50.0000 mg | ORAL_TABLET | Freq: Four times a day (QID) | ORAL | 0 refills | Status: AC | PRN

## 2020-11-12 NOTE — Progress Notes (Signed)
Pt discharged to home via [place]. Discharge instructions and medication education provided to pt.

## 2020-11-12 NOTE — Discharge Instructions (Signed)
Ankle Fracture The ankle joint is made up of the lower (distal) sections of the lower leg bones, called the tibia and fibula, along with a bone in the foot called the talus. An ankle fracture is a break in one, two, or all three of these sections of bone. There are two general types of ankle fractures:  Stable fracture. This happens when one of the bones is broken, but the bones of the ankle joint stay in their normal positions.  Unstable fracture. This type can include more than one broken bone. It can also happen if the outer bone is broken and the strong tissues that connect bones to each other (ligaments) are also injured at the inner ankle. This type of fracture allows the talus to move out of its normal position. What are the causes? This condition may be caused by:  A hard, direct hit to the ankle.  Quickly and severely twisting your ankle, often while your foot is planted and the rest of your body is moving.  Trauma, such as from a car crash or a fall from a height. What increases the risk? The following factors may make you more likely to develop this condition:  Being overweight.  Participating in sports that involve quick direction changes, as in soccer.  Doing high-impact sports such as gymnastics or football. What are the signs or symptoms? Symptoms of this condition include:  A tender and swollen ankle.  Bruising around your injured ankle.  Pain when moving or pressing on your ankle.  Trouble walking or using your ankle to support your body weight (putting weight on your ankle).  Pain that gets worse when you move your foot or ankle or when you stand.  Pain that gets better with rest.   How is this diagnosed? An ankle fracture is usually diagnosed with a physical exam and X-rays. You may also have a CT scan or an MRI. How is this treated? Treatment for this condition depends on the type of ankle fracture you have. Stable fractures are treated with a cast, boot, or  splint to hold the ankle still and crutches to avoid putting weight on the ankle until the fracture heals. Unstable fractures require surgery to ensure that the bones heal properly. After surgery, you will have a splint. After your incision has healed, your surgeon may give you a cast or a boot. You will not be able to put weight on your injured side for several weeks. After your ankle has healed, you will do physical therapy exercises to improve movement and strength in your ankle. Follow these instructions at home: If you have a boot or splint:  Wear the boot or splint as told by your health care provider. Remove it only as told by your health care provider.  Loosen it if your toes tingle, become numb, or turn cold and blue.  Keep it clean and dry. If you have a cast:  Do not put pressure on any part of the cast until it is fully hardened. This may take several hours.  Do not stick anything inside the cast to scratch your skin. Doing that increases your risk of infection.  Check the skin around the cast every day. Tell your health care provider about any concerns.  You may put lotion on dry skin around the edges of the cast. Do not put lotion on the skin underneath the cast.  Keep it clean and dry. Bathing  Do not take baths, swim, or use a hot  tub until your health care provider approves. Ask your health care provider if you may take showers. You may only be allowed to take sponge baths.  If the cast, boot, or splint is not waterproof: ? Do not let it get wet. ? Cover it with a watertight covering when you take a bath or shower. Managing pain, stiffness, and swelling  If directed, put ice on the injured area. To do this: ? If you have a removable splint or boot, remove it as told by your health care provider. ? Put ice in a plastic bag. ? Place a towel between your skin and the bag or between your cast and the bag. ? Leave the ice on for 20 minutes, 2-3 times a day. ? Remove the  ice if your skin turns bright red. This is very important. If you cannot feel pain, heat, or cold, you have a greater risk of damage to the area.  Move your toes often to reduce stiffness and swelling.  Raise (elevate) the injured area above the level of your heart while you are sitting or lying down.   Activity  Do exercises as told by your health care provider.  Return to your normal activities as told by your health care provider. Ask your health care provider what activities are safe for you.  Do not use the injured limb to support your body weight until your health care provider says that you can. Use crutches as told by your health care provider. General instructions  Take over-the-counter and prescription medicines only as told by your health care provider.  Ask your health care provider when it is safe to drive if you have a cast, boot, or splint on your ankle.  Do not use any products that contain nicotine or tobacco, such as cigarettes, e-cigarettes, and chewing tobacco. These can delay bone healing. If you need help quitting, ask your health care provider.  Keep all follow-up visits. This is important. Contact a health care provider if:  You have pain or swelling that gets worse or does not get better with rest or medicine.  Your cast gets damaged. Get help right away if:  You have severe pain that lasts.  You develop new pain or swelling.  Your skin or toenails below the injury turn blue or gray, feel cold, become numb, or are less sensitive to the touch. Summary  An ankle fracture can be stable or unstable. This is determined after a physical exam and imaging studies such as X-rays, a CT scan, or an MRI.  Stable fractures are treated with a cast, boot, or splint to hold the ankle still until the fracture heals. Unstable fractures require surgery to ensure that the bones heal properly.  You will not be able to put weight on your injured side for several  weeks.  Medicines, icing, and raising (elevating) your injured ankle when you are sitting or lying down may help with pain relief. Follow instructions as told by your health care provider. This information is not intended to replace advice given to you by your health care provider. Make sure you discuss any questions you have with your health care provider. Document Revised: 12/23/2019 Document Reviewed: 12/23/2019 Elsevier Patient Education  [place].

## 2020-11-12 NOTE — TOC Progression Note (Addendum)
Transition of Care [facility]) - Progression Note    Patient Details  Name: Latasha [name] MRN: [id number] Date of Birth: [birth year]  Transition of Care [facility]) CM/SW Contact  Joaquin Courts, RN Phone Number: 11/12/2020, 11:22 AM  Clinical Narrative:    CM confirmed with [employer] rep, wheelchair and 3in1 will be delivered to patient's home, possibly tomorrow though [employer] could not give a definite timeline.  Patient was made aware of this and plans to dc home today by ambulance.  [facility] transport arranged.      Barriers to Discharge: No Barriers Identified  Expected Discharge Plan and Services           Expected Discharge Date: 11/12/20               DME Arranged: Wheelchair manual DME Agency: [employer] Date DME Agency Contacted: 11/11/20 Time DME Agency Contacted: 1218 Representative spoke with at DME Agency: [place]: PT,OT,Social Work,RN,Nurse's Aide [place]: [facility] Date [place]: 11/11/20 Time [place]: 1219 Representative spoke with at [place]: [place] ([place]) Interventions    Readmission Risk Interventions No flowsheet data found.

## 2020-11-12 NOTE — Discharge Summary (Signed)
Physician Discharge Summary  Latasha [name] [id number] DOB: Apr 17, 1956 DOA: 11/08/2020  PCP: Shirline Frees, MD  Admit date: 11/08/2020 Discharge date: 11/12/2020  Admitted From:  Disposition:    Recommendations for Outpatient Follow-up:  1. Follow up with PCP in 1-2 weeks 2. Follow-up with orthopedics, Dr. Lorin Mercy in 1-2 weeks for right fibular fracture 3. Follow-up with [facility] gastroenterology in 6 weeks 4. Please obtain BMP/CBC in one week 5. Please follow up on the following pending results: Biopsies from EGD  Home Health: PT/OT/RN/aide/social work Equipment/Devices: Wheelchair, three and one bedside commode  Discharge Condition: Stable CODE STATUS: Full code Diet recommendation: Heart healthy/consistent carbohydrate diet  History of present illness:  Latasha [name] is a 65 year old female with past medical history significant for chronic diastolic congestive heart failure, CKD stage IV, type 2 diabetes mellitus, essential hypertension, morbid obesity who presented to the ED with 2-day history of dark stools.  Patient also reports fall 2 days ago without head injury or loss of consciousness.  Patient also complains of progressive weakness and fatigue.  She fell once again last night and was unable to get up.  She was then transported to the ED for further evaluation.  In the ED, temperature 98.5, HR 77, RR 24, BP 118/48, SPO2 100% on 4 L nasal cannula.  Sodium 139, potassium 4.6, chloride 101, CO2 26, glucose 167, BUN 67, creatinine 2.90.  Troponin 8.  BNP 133.4.  CRP 6.5, D-dimer 2.31.  Procalcitonin 0.26.  WBC 8.6, hemoglobin 4.0, platelets 272.  INR 1.2.  FOBT positive.  Chest x-ray with question of diffuse bilateral airspace disease, symmetric and may represent edema versus pneumonia, progressive left lower lobe consolidation.  Right ankle x-ray notable for nondisplaced fracture distal fibula.  CT abdomen/pelvis without contrast with no acute findings, skin thickening/subcutaneous  soft tissue stranding pannus, scattered irregular pulmonary opacities within the right middle lobe, lingula and lower lobes compatible with sequelae of recent Covid-19 pneumonia.  Orthopedics and [facility] consult.  Hospital service consulted for further evaluation and management of acute symptomatic anemia secondary to upper GI bleed and right distal fibular fracture.  Hospital course:  Acute symptomatic anemia Upper GI bleed secondary to acute gastritis with duodenal ulcers Patient presenting to the ED with 2-day history of progressive fatigue, weakness and dark stools.  Hemoglobin on admission 4.0. [facility] was consulted and underwent EGD on 11/09/2020 by Dr. Michail Sermon with findings of erosions/erythema gastric antrum, nonobstructing nonbleeding cratered duodenal ulcers, erythema and ulceration duodenal bulb and biopsy taken.  Patient was transfused total 6 units PRBCs during hospitalization.  Hemoglobin 8.2 at time of discharge, stable.  Pending surgical pathology from EGD at time of discharge.  We will continue Protonix 40 mg p.o. twice daily x1 month followed by daily dosing.  Will need repeat CBC in 1 week following discharge.  Will need follow-up with [facility] gastroenterology in 6-8 weeks.  Acute on chronic hypoxic respiratory failure resolved Patient presenting with hypoxia, secondary to severe anemia with a hemoglobin of 4.0.  Initially requiring 4 L nasal cannula.  Patient's oxygen requirement titrated down to her baseline 2 L nasal cannula at time of discharge.  Acute distal right fibular fracture X-ray right ankle notable for nondisplaced fracture distal fibula.  Seen by orthopedics, recommend placement of a CAM boot.  Touchdown weightbearing.  Outpatient follow-up with Dr. Lorin Mercy. PT/OT recommend home health, wheelchair and three and one bedside commode.  CKD stage IV Patient follows with nephrology outpatient, Dr. Johnney Ou.  Recently had a  tunneled HD catheter placed on 10/26/2020 for  anticipated need for dialysis in the near future.  Creatinine peaked at during hospitalization 2.97.  Creatinine 2.80 at time of discharge.  Outpatient follow-up with nephrology.  Type 2 diabetes mellitus Home regimen includes insulin 70/30 60u [place] qAM and 50u SQ qPM.   Chronic diastolic congestive heart failure, compensated Includes carvedilol 25 mg twice daily, furosemide 80 mg p.o. daily.  HLD: Continue atorvastatin 40 mg p.o. daily  Morbid obesity Body mass index is 46.01 kg/m.  Discussed with patient needs for aggressive lifestyle changes/weight loss as this complicates all facets of care.  Outpatient follow-up with PCP.  May benefit from bariatric evaluation outpatient.  Hx COVID infection  Patient diagnosed with Covid-19 viral infection on 10/22/2020.  Patient asymptomatic, can discontinue airborne/contact precautions at this time; as it is greater than 10 days from initial diagnosis.  Discharge Diagnoses:  Active Problems:   Insulin-requiring or dependent type II diabetes mellitus (HCC)   Essential hypertension   GASTROESOPHAGEAL REFLUX DISEASE   Hyperlipemia   Chronic diastolic CHF (congestive heart failure) (HCC)   CKD (chronic kidney disease) stage 4, GFR 15-29 ml/min (HCC)   Fibula fracture    Discharge Instructions  Discharge Instructions    Call MD for:  difficulty breathing, headache or visual disturbances   Complete by: As directed    Call MD for:  extreme fatigue   Complete by: As directed    Call MD for:  persistant dizziness or light-headedness   Complete by: As directed    Call MD for:  persistant nausea and vomiting   Complete by: As directed    Call MD for:  severe uncontrolled pain   Complete by: As directed    Call MD for:  temperature >100.4   Complete by: As directed    Diet - low sodium heart healthy   Complete by: As directed    Increase activity slowly   Complete by: As directed    No wound care   Complete by: As directed       Allergies as of 11/12/2020      Reactions   Hydrocodone-acetaminophen Other (See Comments)   Unknown reaction   Oxycodone Hcl Other (See Comments)   Unknown reaction      Medication List    TAKE these medications   acetaminophen 500 MG tablet Commonly known as: TYLENOL Take 500 mg by mouth every 6 (six) hours as needed for mild pain, fever or headache.   albuterol 108 (90 Base) MCG/ACT inhaler Commonly known as: VENTOLIN HFA Inhale 2 puffs into the lungs every 6 (six) hours as needed for wheezing or shortness of breath.   amLODipine 10 MG tablet Commonly known as: NORVASC Take 1 tablet (10 mg total) by mouth daily.   atorvastatin 40 MG tablet Commonly known as: LIPITOR Take 40 mg by mouth daily.   carvedilol 25 MG tablet Commonly known as: COREG Take 1 tablet (25 mg total) by mouth 2 (two) times daily with a meal.   furosemide 80 MG tablet Commonly known as: LASIX Take 1 tablet (80 mg total) by mouth daily.   insulin aspart protamine- aspart (70-30) 100 UNIT/ML injection Commonly known as: NOVOLOG MIX 70/30 Inject 50-60 Units into the skin See admin instructions. Use 60 units every morning and use 50 units every evening   nitroGLYCERIN 0.4 MG SL tablet Commonly known as: NITROSTAT Place 1 tablet (0.4 mg total) under the tongue every 5 (five) minutes as needed for chest  pain.   pantoprazole 40 MG tablet Commonly known as: Protonix Take 1 tablet (40 mg total) by mouth 2 (two) times daily for 30 days, THEN 1 tablet (40 mg total) daily. Start taking on: November 12, 2020   traMADol 50 MG tablet Commonly known as: ULTRAM Take 1 tablet (50 mg total) by mouth every 6 (six) hours as needed for up to 7 days for moderate pain.            Durable Medical Equipment  (From admission, onward)         Start     Ordered   11/12/20 0653  For home use only DME 3 n 1  Once       Comments: Bariatric with drop arm. See PT notes   11/12/20 [id number]   11/11/20 0701  For home  use only DME lightweight manual wheelchair with seat cushion  Once       Comments: Patient suffers from right distal fibular fracture which impairs their ability to perform daily activities like ambulation/mobility in the home.  A walker will not resolve  issue with performing activities of daily living. A wheelchair will allow patient to safely perform daily activities. Patient is not able to propel themselves in the home using a standard weight wheelchair due to weakness patient can self propel in the lightweight wheelchair. Length of need 6 months Accessories: elevating leg rests (ELRs), wheel locks, extensions and anti-tippers.   11/11/20 0704          Follow-up Information    Gastroenterology, [facility]. Schedule an appointment as soon as possible for a visit in 6 week(s).   Contact information: [place] [address] [phone]        Shirline Frees, MD. Schedule an appointment as soon as possible for a visit in 1 week(s).   Specialty: Family Medicine Why: need f/u CBC at visit Contact information: [address] [place] [address] [phone]        Lorretta Harp, MD .   Specialties: Cardiology, Radiology Contact information: [address] [place] [address] [phone]        Marybelle Killings, MD. Schedule an appointment as soon as possible for a visit in 1 week(s).   Specialty: Orthopedic Surgery Contact information: [address] [place] [address] [phone]              Allergies  Allergen Reactions  . Hydrocodone-Acetaminophen Other (See Comments)    Unknown reaction  . Oxycodone Hcl Other (See Comments)    Unknown reaction    Consultations:  [facility], Dr. Michail Sermon  Orthopedics, Dr. Lorin Mercy   Procedures/Studies: CT ABDOMEN PELVIS WO CONTRAST  Result Date: 11/08/2020 CLINICAL DATA:  Abdominal pain.  Recent admission for COVID-19. EXAM: CT ABDOMEN AND PELVIS WITHOUT CONTRAST  TECHNIQUE: Multidetector CT imaging of the abdomen and pelvis was performed following the standard protocol without IV contrast. COMPARISON:  CT AP 05/01/2017 FINDINGS: Lower chest: Scattered, irregular pulmonary opacities are identified within the right middle lobe, lingula and both lower lobes compatible with sequelae of recent COVID-19 pneumonia. No pleural effusion. Hepatobiliary: No focal liver abnormality is seen. No gallstones, gallbladder wall thickening, or biliary dilatation. Pancreas: Mild diffuse fatty infiltration of the pancreas. No pancreatic inflammation, main duct dilatation or mass noted Spleen: Normal in size without focal abnormality. Adrenals/Urinary Tract: Normal appearance of the adrenal glands. No kidney mass or hydronephrosis identified. No kidney stones. No hydroureter or  ureteral lithiasis. Urinary bladder is unremarkable. Stomach/Bowel: Stomach appears normal. The appendix is visualized and appears normal. No bowel wall thickening, inflammation or distension. Sigmoid diverticulosis noted without signs of acute inflammation. Vascular/Lymphatic: Aortic atherosclerosis. No abdominopelvic adenopathy identified. Reproductive: Status post hysterectomy. No adnexal masses. Other: No free fluid or fluid collections. Fat containing umbilical hernia noted. Musculoskeletal: Skin thickening and subcutaneous soft tissue stranding noted involving the patient's pannus. No focal body wall fluid collection identified to suggest abscess. Degenerative disc disease noted at L5-S1. IMPRESSION: 1. No acute findings identified within the abdomen or pelvis. 2. Skin thickening and subcutaneous soft tissue stranding involving the patient's pannus. Correlate for any signs of cellulitis. 3. Scattered, irregular pulmonary opacities are identified within the right middle lobe, lingula and both lower lobes compatible with sequelae of recent COVID-19 pneumonia. 4. Aortic atherosclerosis. Aortic Atherosclerosis  (ICD10-I70.0). Electronically Signed   By: Kerby Moors M.D.   On: 11/08/2020 14:25   US RENAL  Result Date: 10/24/2020 CLINICAL DATA:  Renal failure EXAM: RENAL / URINARY TRACT ULTRASOUND COMPLETE COMPARISON:  Renal stone CT 05/01/2017 Ultrasound kidneys 05/16/2017 FINDINGS: Right Kidney: Renal measurements: 12.2 x 4.8 x 5.2 cm = volume: 159 mL. Echogenicity within normal limits. No mass or hydronephrosis visualized. Left Kidney: Renal measurements: 12.9 x 5.9 x 5.9 cm = volume: 229 mL. Echogenicity within normal limits. No mass or hydronephrosis visualized. Visualization of the left kidney is limited. Bladder: Not visualized.  Decompressed. Other: None. IMPRESSION: No acute abnormality of the kidneys. Electronically Signed   By: Miachel Roux M.D.   On: 10/24/2020 07:45   IR Fluoro Guide CV Line Right  Result Date: 10/26/2020 INDICATION: 65 year old with end-stage renal disease and COVID-19. Patient needs access for hemodialysis. EXAM: FLUOROSCOPIC AND ULTRASOUND GUIDED PLACEMENT OF A TUNNELED DIALYSIS CATHETER Physician: Stephan Minister. Anselm Pancoast, MD MEDICATIONS: Ancef 2 g; The antibiotic was administered within an appropriate time interval prior to skin puncture. ANESTHESIA/SEDATION: Versed 1.0 mg IV; Fentanyl 50 mcg IV; Moderate Sedation Time:  32 minutes The patient was continuously monitored during the procedure by the interventional radiology nurse under my direct supervision. FLUOROSCOPY TIME:  Fluoroscopy Time: 24 seconds, 14 mGy COMPLICATIONS: None immediate. PROCEDURE: The procedure was explained to the patient. The risks and benefits of the procedure were discussed and the patient's questions were addressed. Informed consent was obtained from the patient. The patient was placed supine on the interventional table. Ultrasound confirmed a patent right internal jugular vein. Ultrasound images were obtained for documentation. The right neck and chest was prepped and draped in a sterile fashion. The right neck  was anesthetized with 1% lidocaine. Maximal barrier sterile technique was utilized including caps, mask, sterile gowns, sterile gloves, sterile drape, hand hygiene and skin antiseptic. A small incision was made with #11 blade scalpel. A 21 gauge needle directed into the right internal jugular vein with ultrasound guidance. A micropuncture dilator set was placed. A 23 cm tip to cuff Palindrome catheter was selected. The skin below the right clavicle was anesthetized and a small incision was made with an #11 blade scalpel. A subcutaneous tunnel was formed to the vein dermatotomy site. The catheter was brought through the tunnel. The vein dermatotomy site was dilated to accommodate a peel-away sheath. The catheter was placed through the peel-away sheath and directed into the central venous structures. The tip of the catheter was placed in the upper right atrium with fluoroscopy. Fluoroscopic images were obtained for documentation. Both lumens were found to aspirate and flush well. The  proper amount of heparin was flushed in both lumens. The vein dermatotomy site was closed using a single layer of absorbable suture and Dermabond. Gel-Foam was placed in subcutaneous tract. The catheter was secured to the skin using Prolene suture. IMPRESSION: Successful placement of a right jugular tunneled dialysis catheter using ultrasound and fluoroscopic guidance. Electronically Signed   By: Markus Daft M.D.   On: 10/26/2020 10:41   IR US Guide Vasc Access Right  Result Date: 10/26/2020 INDICATION: 65 year old with end-stage renal disease and COVID-19. Patient needs access for hemodialysis. EXAM: FLUOROSCOPIC AND ULTRASOUND GUIDED PLACEMENT OF A TUNNELED DIALYSIS CATHETER Physician: Stephan Minister. Anselm Pancoast, MD MEDICATIONS: Ancef 2 g; The antibiotic was administered within an appropriate time interval prior to skin puncture. ANESTHESIA/SEDATION: Versed 1.0 mg IV; Fentanyl 50 mcg IV; Moderate Sedation Time:  32 minutes The patient was  continuously monitored during the procedure by the interventional radiology nurse under my direct supervision. FLUOROSCOPY TIME:  Fluoroscopy Time: 24 seconds, 14 mGy COMPLICATIONS: None immediate. PROCEDURE: The procedure was explained to the patient. The risks and benefits of the procedure were discussed and the patient's questions were addressed. Informed consent was obtained from the patient. The patient was placed supine on the interventional table. Ultrasound confirmed a patent right internal jugular vein. Ultrasound images were obtained for documentation. The right neck and chest was prepped and draped in a sterile fashion. The right neck was anesthetized with 1% lidocaine. Maximal barrier sterile technique was utilized including caps, mask, sterile gowns, sterile gloves, sterile drape, hand hygiene and skin antiseptic. A small incision was made with #11 blade scalpel. A 21 gauge needle directed into the right internal jugular vein with ultrasound guidance. A micropuncture dilator set was placed. A 23 cm tip to cuff Palindrome catheter was selected. The skin below the right clavicle was anesthetized and a small incision was made with an #11 blade scalpel. A subcutaneous tunnel was formed to the vein dermatotomy site. The catheter was brought through the tunnel. The vein dermatotomy site was dilated to accommodate a peel-away sheath. The catheter was placed through the peel-away sheath and directed into the central venous structures. The tip of the catheter was placed in the upper right atrium with fluoroscopy. Fluoroscopic images were obtained for documentation. Both lumens were found to aspirate and flush well. The proper amount of heparin was flushed in both lumens. The vein dermatotomy site was closed using a single layer of absorbable suture and Dermabond. Gel-Foam was placed in subcutaneous tract. The catheter was secured to the skin using Prolene suture. IMPRESSION: Successful placement of a right  jugular tunneled dialysis catheter using ultrasound and fluoroscopic guidance. Electronically Signed   By: Markus Daft M.D.   On: 10/26/2020 10:41   DG Chest Port 1 View  Result Date: 11/08/2020 CLINICAL DATA:  Worsening hypoxia.  COVID positive EXAM: PORTABLE CHEST 1 VIEW COMPARISON:  10/22/2020 FINDINGS: Progression of diffuse bilateral airspace disease. Possible edema or pneumonia. Progressive left lower lobe consolidation. Interval placement of right jugular dual lumen catheter with the tip in the right atrium. No pneumothorax. IMPRESSION: Progression of diffuse bilateral airspace disease. This is symmetric and may represent edema versus pneumonia. Progressive left lower lobe consolidation. Electronically Signed   By: Franchot Gallo M.D.   On: 11/08/2020 08:45   DG Chest Port 1 View  Result Date: 10/22/2020 CLINICAL DATA:  Shortness of breath/hypoxia. COVID-19 positive exposure. EXAM: PORTABLE CHEST 1 VIEW COMPARISON:  10/09/2020 FINDINGS: Lungs are adequately inflated with patchy bilateral airspace opacification  likely multifocal infection and may be viral in origin. No effusion. Borderline stable cardiomegaly. Remainder of the exam is unchanged. IMPRESSION: Patchy bilateral airspace process likely multifocal pneumonia which may be viral in origin. Electronically Signed   By: Marin Olp M.D.   On: 10/22/2020 09:44   DG Knee Right Port  Result Date: 10/30/2020 CLINICAL DATA:  Chronic right knee pain. EXAM: PORTABLE RIGHT KNEE - 1-2 VIEW COMPARISON:  None. FINDINGS: Moderate to marked medial joint space narrowing. Moderate medial, mild to moderate patellofemoral and minimal lateral spur formation. No effusion. IMPRESSION: Tricompartmental degenerative changes, most pronounced in the medial compartment. Electronically Signed   By: Claudie Revering M.D.   On: 10/30/2020 12:03   DG Ankle Left Port  Result Date: 11/08/2020 CLINICAL DATA:  Right ankle pain EXAM: PORTABLE LEFT ANKLE - 2 VIEW COMPARISON:   None. FINDINGS: There is no evidence of fracture, dislocation, or joint effusion. There is no evidence of arthropathy or other focal bone abnormality. Diffuse arterial calcification. IMPRESSION: Negative. Electronically Signed   By: Franchot Gallo M.D.   On: 11/08/2020 08:46   DG Ankle Right Port  Result Date: 11/08/2020 CLINICAL DATA:  Multiple falls EXAM: PORTABLE RIGHT ANKLE - 2 VIEW COMPARISON:  None. FINDINGS: Nondisplaced fracture distal fibula. No fracture of the tibia. Ankle mortise intact. Arterial calcification IMPRESSION: Nondisplaced fracture distal fibula. Electronically Signed   By: Franchot Gallo M.D.   On: 11/08/2020 08:47   VAS [place] LOWER EXTREMITY VENOUS (DVT)  Result Date: 11/08/2020  Lower Venous DVT Study Indications: Covid+ elevated d-dimer.  Limitations: Body habitus and poor ultrasound/tissue interface. Comparison Study: Previous exam 06/08/2017 - negative Performing Technologist: Rogelia Rohrer  Examination Guidelines: A complete evaluation includes B-mode imaging, spectral Doppler, color Doppler, and power Doppler as needed of all accessible portions of each vessel. Bilateral testing is considered an integral part of a complete examination. Limited examinations for reoccurring indications may be performed as noted. The reflux portion of the exam is performed with the patient in reverse Trendelenburg.  +---------+---------------+---------+-----------+----------+-------------------+ RIGHT    CompressibilityPhasicitySpontaneityPropertiesThrombus Aging      +---------+---------------+---------+-----------+----------+-------------------+ CFV      Full           Yes      Yes                                      +---------+---------------+---------+-----------+----------+-------------------+ SFJ      Full                                                             +---------+---------------+---------+-----------+----------+-------------------+ FV Prox  Full           Yes       Yes                                      +---------+---------------+---------+-----------+----------+-------------------+ FV Mid   Full           Yes      Yes                                      +---------+---------------+---------+-----------+----------+-------------------+  FV DistalFull           Yes      Yes                                      +---------+---------------+---------+-----------+----------+-------------------+ PFV      Full                                                             +---------+---------------+---------+-----------+----------+-------------------+ POP      Full           Yes      Yes                                      +---------+---------------+---------+-----------+----------+-------------------+ PTV      Full                                                             +---------+---------------+---------+-----------+----------+-------------------+ PERO     Full                                         Not well visualized +---------+---------------+---------+-----------+----------+-------------------+   +---------+---------------+---------+-----------+----------+-------------------+ LEFT     CompressibilityPhasicitySpontaneityPropertiesThrombus Aging      +---------+---------------+---------+-----------+----------+-------------------+ CFV      Full           Yes      Yes                                      +---------+---------------+---------+-----------+----------+-------------------+ SFJ      Full                                                             +---------+---------------+---------+-----------+----------+-------------------+ FV Prox  Full           Yes      Yes                                      +---------+---------------+---------+-----------+----------+-------------------+ FV Mid   Full           Yes      Yes                                       +---------+---------------+---------+-----------+----------+-------------------+ FV DistalFull           Yes      Yes                                      +---------+---------------+---------+-----------+----------+-------------------+  PFV      Full                                                             +---------+---------------+---------+-----------+----------+-------------------+ POP      Full           Yes      Yes                                      +---------+---------------+---------+-----------+----------+-------------------+ PTV      Full                                                             +---------+---------------+---------+-----------+----------+-------------------+ PERO     Full                                         Not well visualized +---------+---------------+---------+-----------+----------+-------------------+     Summary: BILATERAL: - No evidence of deep vein thrombosis seen in the lower extremities, bilaterally. -No evidence of popliteal cyst, bilaterally.   *See table(s) above for measurements and observations. Electronically signed by Harold Barban MD on 11/08/2020 at 7:13:26 PM.    Final       Subjective: Patient seen and examined bedside, resting comfortably.  Eating breakfast.  States ready and anticipating discharge home today.  No other questions or concerns at this time.  Denies headache, no fever/chills/night sweats, no nausea/vomiting/diarrhea, no chest pain, no palpitations, no shortness of breath, no abdominal pain.  No acute events overnight per nursing staff.  Discharge Exam: Vitals:   11/11/20 2025 11/12/20 0514  BP: (!) 160/62 (!) 154/61  Pulse: 71 70  Resp: 17 20  Temp: 98 F (36.7 C) 98.3 F (36.8 C)  SpO2: 92% 93%   Vitals:   11/11/20 0500 11/11/20 1350 11/11/20 2025 11/12/20 0514  BP:  (!) 138/55 (!) 160/62 (!) 154/61  Pulse:  68 71 70  Resp:  '18 17 20  '$ Temp:  98.8 F (37.1 C) 98 F (36.7 C) 98.3 F  (36.8 C)  TempSrc:  Oral Oral Oral  SpO2:   92% 93%  Weight: 123.5 kg   118.9 kg  Height:        General: Pt is alert, awake, not in acute distress, obese Cardiovascular: RRR, S1/S2 +, no rubs, no gallops  Respiratory: CTA bilaterally, no wheezing, no rhonchi, on 2 L nasal cannula which is her normal baseline Abdominal: Soft, NT, ND, bowel sounds + Extremities: no edema, no cyanosis, slight tenderness to palpation right ankle    The results of significant diagnostics from this hospitalization (including imaging, microbiology, ancillary and laboratory) are listed below for reference.     Microbiology: No results found for this or any previous visit (from the past 240 hour(s)).   Labs: BNP (last 3 results) Recent Labs    03/05/20 0416 11/08/20 0743  BNP 66.5 [id number]   Basic Metabolic Panel: Recent Labs  Lab  11/08/20 0743 11/09/20 0328 11/10/20 0226 11/11/20 0453 11/12/20 0421  NA 139 141 139 133* 137  K 4.6 4.7 5.5* 4.8 4.7  CL 101 103 103 101 100  CO2 '26 24 24 25 26  '$ GLUCOSE 167* 179* 333* 220* 194*  BUN 67* 76* 74* 69* 59*  CREATININE 2.90* 2.91* 2.88* 2.97* 2.80*  CALCIUM 7.4* 6.9* 7.1* 7.2* 7.5*  MG  --  2.0  --   --   --   PHOS  --  5.0*  --   --   --    Liver Function Tests: Recent Labs  Lab 11/08/20 0743 11/09/20 0328 11/10/20 0226 11/11/20 0453 11/12/20 0421  AST 16 12* 13* 15 16  ALT '15 14 13 15 17  '$ ALKPHOS 58 50 51 55 61  BILITOT 0.4 0.6 0.5 0.3 0.5  PROT 6.0* 5.1* 5.6* 5.7* 5.9*  ALBUMIN 2.3* 2.3* 2.4* 2.7* 2.7*   No results for input(s): LIPASE, AMYLASE in the last 168 hours. No results for input(s): AMMONIA in the last 168 hours. CBC: Recent Labs  Lab 11/09/20 0328 11/09/20 0913 11/09/20 1505 11/10/20 0226 11/10/20 1350 11/10/20 2057 11/11/20 0453 11/12/20 0421  WBC 7.9 7.2  --  5.7  --   --  5.9 5.2  NEUTROABS 5.3 4.8  --  4.6  --   --  4.0 3.0  HGB 6.9* 7.3*   < > 7.0*  7.0* 7.7* 7.9* 7.9* 8.2*  HCT 21.5* 22.2*   < > 21.8*   22.4* 23.5* 24.5* 24.5* 25.2*  MCV 93.9 92.5  --  93.7  --   --  94.2 95.1  PLT 198 185  --  211  --   --  230 280   < > = values in this interval not displayed.   Cardiac Enzymes: No results for input(s): CKTOTAL, CKMB, CKMBINDEX, TROPONINI in the last 168 hours. BNP: Invalid input(s): POCBNP CBG: Recent Labs  Lab 11/11/20 0804 11/11/20 1157 11/11/20 1650 11/11/20 2029 11/12/20 0749  GLUCAP 226* 198* 205* 239* 162*   D-Dimer No results for input(s): DDIMER in the last 72 hours. Hgb A1c No results for input(s): HGBA1C in the last 72 hours. Lipid Profile No results for input(s): CHOL, HDL, LDLCALC, TRIG, CHOLHDL, LDLDIRECT in the last 72 hours. Thyroid function studies No results for input(s): TSH, T4TOTAL, T3FREE, THYROIDAB in the last 72 hours.  Invalid input(s): FREET3 Anemia work up No results for input(s): VITAMINB12, FOLATE, FERRITIN, TIBC, IRON, RETICCTPCT in the last 72 hours. Urinalysis    Component Value Date/Time   COLORURINE YELLOW 10/22/2020 1007   APPEARANCEUR CLOUDY (A) 10/22/2020 1007   LABSPEC 1.012 10/22/2020 1007   PHURINE 5.0 10/22/2020 1007   GLUCOSEU NEGATIVE 10/22/2020 1007   HGBUR SMALL (A) 10/22/2020 1007   BILIRUBINUR NEGATIVE 10/22/2020 1007   KETONESUR NEGATIVE 10/22/2020 1007   PROTEINUR 100 (A) 10/22/2020 1007   UROBILINOGEN 0.2 02/14/2012 1639   NITRITE NEGATIVE 10/22/2020 1007   LEUKOCYTESUR NEGATIVE 10/22/2020 1007   Sepsis Labs Invalid input(s): PROCALCITONIN,  WBC,  LACTICIDVEN Microbiology No results found for this or any previous visit (from the past 240 hour(s)).   Time coordinating discharge: Over 30 minutes  SIGNED:   Donnamarie Poag [place] DO  [facility] 11/12/2020, 9:07 AM

## 2020-11-13 ENCOUNTER — Other Ambulatory Visit: Payer: Self-pay

## 2020-11-13 DIAGNOSIS — I5032 Chronic diastolic (congestive) heart failure: Secondary | ICD-10-CM | POA: Diagnosis not present

## 2020-11-13 DIAGNOSIS — J1282 Pneumonia due to coronavirus disease 2019: Secondary | ICD-10-CM | POA: Diagnosis not present

## 2020-11-13 DIAGNOSIS — D631 Anemia in chronic kidney disease: Secondary | ICD-10-CM | POA: Diagnosis not present

## 2020-11-13 DIAGNOSIS — I130 Hypertensive heart and chronic kidney disease with heart failure and stage 1 through stage 4 chronic kidney disease, or unspecified chronic kidney disease: Secondary | ICD-10-CM | POA: Diagnosis not present

## 2020-11-13 DIAGNOSIS — N184 Chronic kidney disease, stage 4 (severe): Secondary | ICD-10-CM | POA: Diagnosis not present

## 2020-11-13 DIAGNOSIS — J9621 Acute and chronic respiratory failure with hypoxia: Secondary | ICD-10-CM | POA: Diagnosis not present

## 2020-11-13 DIAGNOSIS — E1122 Type 2 diabetes mellitus with diabetic chronic kidney disease: Secondary | ICD-10-CM | POA: Diagnosis not present

## 2020-11-13 DIAGNOSIS — U071 COVID-19: Secondary | ICD-10-CM | POA: Diagnosis not present

## 2020-11-13 NOTE — Patient Outreach (Signed)
[place] [facility]) Care Management  [place]  11/13/2020   Latasha [name] 1955/10/28 [id number]  Subjective: Telephone call to patient for follow up. Patient reports she is back home but is weak. She reports working with PT today.  Discussed GI bleed and importance of prompt follow up as scheduled. Patient to see PCP next week.  Discussed GI bleed worsening.  Patient continues to manage diabetes.  Patient also continues COVID recovery.    Objective:   Encounter Medications:  Outpatient Encounter Medications as of 11/13/2020  Medication Sig  . acetaminophen (TYLENOL) 500 MG tablet Take 500 mg by mouth every 6 (six) hours as needed for mild pain, fever or headache.  . albuterol (PROVENTIL HFA;VENTOLIN HFA) 108 (90 Base) MCG/ACT inhaler Inhale 2 puffs into the lungs every 6 (six) hours as needed for wheezing or shortness of breath.  Marland Kitchen amLODipine (NORVASC) 10 MG tablet Take 1 tablet (10 mg total) by mouth daily.  Marland Kitchen atorvastatin (LIPITOR) 40 MG tablet Take 40 mg by mouth daily.   . carvedilol (COREG) 25 MG tablet Take 1 tablet (25 mg total) by mouth 2 (two) times daily with a meal.  . furosemide (LASIX) 80 MG tablet Take 1 tablet (80 mg total) by mouth daily.  . insulin aspart protamine- aspart (NOVOLOG MIX 70/30) (70-30) 100 UNIT/ML injection Inject 50-60 Units into the skin See admin instructions. Use 60 units every morning and use 50 units every evening  . nitroGLYCERIN (NITROSTAT) 0.4 MG SL tablet Place 1 tablet (0.4 mg total) under the tongue every 5 (five) minutes as needed for chest pain.  . pantoprazole (PROTONIX) 40 MG tablet Take 1 tablet (40 mg total) by mouth 2 (two) times daily for 30 days, THEN 1 tablet (40 mg total) daily.  . traMADol (ULTRAM) 50 MG tablet Take 1 tablet (50 mg total) by mouth every 6 (six) hours as needed for up to 7 days for moderate pain.   No facility-administered encounter medications on file as of 11/13/2020.    Functional Status:  In your  present state of health, do you have any difficulty performing the following activities: 11/08/2020 11/08/2020  Hearing? - N  Vision? - N  Comment - -  Difficulty concentrating or making decisions? - N  Walking or climbing stairs? - Y  Comment - secondary to weakness  Dressing or bathing? - N  Doing errands, shopping? Y -  Conservation officer, nature and eating ? - -  Using the Toilet? - -  In the past six months, have you accidently leaked urine? - -  Do you have problems with loss of bowel control? - -  Managing your Medications? - -  Managing your Finances? - -  Housekeeping or managing your Housekeeping? - -  Comment - -  Some recent data might be hidden    Fall/Depression Screening: Fall Risk  11/03/2020 08/09/2016 01/18/2016  Falls in the past year? 1 Yes No  Number falls in past yr: 0 2 or more -  Injury with Fall? 0 No -  Follow up Falls prevention discussed Falls evaluation completed -   PHQ 2/9 Scores 11/03/2020  PHQ - 2 Score 1    Assessment: Patient recovering from hospitalization. Family supportive.   Goals Addressed            This Visit's Progress   . Follow My Treatment Plan- GI Bleed       Timeframe:  Short-Term Goal Priority:  High Start Date:  11/13/20                       Expected End Date:     01/04/21                  Follow Up Date 12/04/20  Patient Self Care Activities:  . Self administers medications as prescribed . Attends all scheduled provider appointments . Calls provider office for new concerns or questions . Monitor for continued black tarry stools, bright red blood in stool or clots in stool, abnormal abdominal pain.       Notes: 11/13/20 Reviewed signs of active GI  bleeding.    Linward Headland and Keep All Appointments   On track    Timeframe:  Long-Range Goal Priority:  High Start Date: 11/03/20                           Expected End Date:    12/04/20                   Follow Up Date 12/04/20   - arrange a ride through an agency 1 week before  appointment - ask family or friend for a ride - keep a calendar with appointment dates    Why is this important?    Part of staying healthy is seeing the doctor for follow-up care.   If you forget your appointments, there are some things you can do to stay on track.    Notes: Patient does not drive. Patient's daughter takes her to appointments.  [employer] given.  11/07/20 patient to make follow up with physician. 11/13/20 Patient to see PCP 11/22/20    . THN-Manage My Medicine   On track    Timeframe:  Short-Term Goal Priority:  High Start Date:  11/03/20                           Expected End Date:   12/04/20                    Follow Up Date 12/04/20   - keep a list of all the medicines I take; vitamins and herbals too - learn to read medicine labels    Why is this important?   . These steps will help you keep on track with your medicines.   Notes: Patient takes medications as prescribed. 11/13/20 Patient medications reviewed.    . THN-Matintain My Quality of Life-Get back to my normal self   On track    Timeframe:  Short-Term Goal Priority:  High Start Date:  11/03/20                           Expected End Date:    12/04/20                   Follow Up Date 12/04/20    - make shared treatment decisions with doctor - spend time with a child every day, borrow one if I have to    Why is this important?    Having a long-term illness can be scary.   It can also be stressful for you and your caregiver.   These steps may help.    Notes: Patient yearns to get back to normal and interact with her family.11/07/20- patient feels bad about slow recovery. Encouraged  patient to hang in there. 11/13/20 Patient working with PT for strengthening.     . THN-Monitor and Manage My Blood Sugar-Diabetes Type 2   On track    Timeframe:  Long-Range Goal Priority:  Medium Start Date:     11/03/20                        Expected End Date:  01/04/21                     Follow Up Date  01/04/21   - check blood sugar at prescribed times - check blood sugar before and after exercise    Why is this important?    Checking your blood sugar at home helps to keep it from getting very high or very low.   Writing the results in a diary or log helps the doctor know how to care for you.   Your blood sugar log should have the time, date and the results.   Also, write down the amount of insulin or other medicine that you take.   Other information, like what you ate, exercise done and how you were feeling, will also be helpful.     Notes: 11/03/20 Blood sugar this am 109.  Patient checks sugars twice daily.  11/13/20 Patient continues to check sugars as prescribed.      . COMPLETED: THN-Set My Target A1C-Diabetes Type 2       Timeframe:  Short-Term Goal Priority:  Medium Start Date:   11/03/20                          Expected End Date:      01/04/21                 Follow Up Date 01/04/21   - set target A1C    Why is this important?    Your target A1C is decided together by you and your doctor.   It is based on several things like your age and other health issues.    Notes: Patient reports A1c down to 7.1 but endocrinologists wants it lower than 7.0.  11/13/20 A1c goal 7.0       Plan: RN CM will follow up again in the month of February. Follow-up:  Patient agrees to Care Plan and Follow-up.   Jone Baseman, RN, MSN [place] [facility] Care Management Coordinator Direct Line [phone] Cell [phone] Toll Free: [phone]  Fax: [phone]

## 2020-11-13 NOTE — Patient Instructions (Signed)
Goals Addressed            This Visit's Progress   . Follow My Treatment Plan- GI Bleed       Timeframe:  Short-Term Goal Priority:  High Start Date:      11/13/20                       Expected End Date:     01/04/21                  Follow Up Date 12/04/20  Patient Self Care Activities:  . Self administers medications as prescribed . Attends all scheduled provider appointments . Calls provider office for new concerns or questions . Monitor for continued black tarry stools, bright red blood in stool or clots in stool, abnormal abdominal pain.       Notes: 11/13/20 Reviewed signs of active GI  bleeding.    Linward Headland and Keep All Appointments   On track    Timeframe:  Long-Range Goal Priority:  High Start Date: 11/03/20                           Expected End Date:    12/04/20                   Follow Up Date 12/04/20   - arrange a ride through an agency 1 week before appointment - ask family or friend for a ride - keep a calendar with appointment dates    Why is this important?    Part of staying healthy is seeing the doctor for follow-up care.   If you forget your appointments, there are some things you can do to stay on track.    Notes: Patient does not drive. Patient's daughter takes her to appointments.  [employer] given.  11/07/20 patient to make follow up with physician. 11/13/20 Patient to see PCP 11/22/20    . THN-Manage My Medicine   On track    Timeframe:  Short-Term Goal Priority:  High Start Date:  11/03/20                           Expected End Date:   12/04/20                    Follow Up Date 12/04/20   - keep a list of all the medicines I take; vitamins and herbals too - learn to read medicine labels    Why is this important?   . These steps will help you keep on track with your medicines.   Notes: Patient takes medications as prescribed. 11/13/20 Patient medications reviewed.    . THN-Matintain My Quality of Life-Get back to my normal self    On track    Timeframe:  Short-Term Goal Priority:  High Start Date:  11/03/20                           Expected End Date:    12/04/20                   Follow Up Date 12/04/20    - make shared treatment decisions with doctor - spend time with a child every day, borrow one if I have to    Why is this important?    Having a long-term  illness can be scary.   It can also be stressful for you and your caregiver.   These steps may help.    Notes: Patient yearns to get back to normal and interact with her family.11/07/20- patient feels bad about slow recovery. Encouraged patient to hang in there. 11/13/20 Patient working with PT for strengthening.     . THN-Monitor and Manage My Blood Sugar-Diabetes Type 2   On track    Timeframe:  Long-Range Goal Priority:  Medium Start Date:     11/03/20                        Expected End Date:  01/04/21                     Follow Up Date 01/04/21   - check blood sugar at prescribed times - check blood sugar before and after exercise    Why is this important?    Checking your blood sugar at home helps to keep it from getting very high or very low.   Writing the results in a diary or log helps the doctor know how to care for you.   Your blood sugar log should have the time, date and the results.   Also, write down the amount of insulin or other medicine that you take.   Other information, like what you ate, exercise done and how you were feeling, will also be helpful.     Notes: 11/03/20 Blood sugar this am 109.  Patient checks sugars twice daily.  11/13/20 Patient continues to check sugars as prescribed.      . COMPLETED: THN-Set My Target A1C-Diabetes Type 2       Timeframe:  Short-Term Goal Priority:  Medium Start Date:   11/03/20                          Expected End Date:      01/04/21                 Follow Up Date 01/04/21   - set target A1C    Why is this important?    Your target A1C is decided together by you and your doctor.   It  is based on several things like your age and other health issues.    Notes: Patient reports A1c down to 7.1 but endocrinologists wants it lower than 7.0.  11/13/20 A1c goal 7.0

## 2020-11-14 DIAGNOSIS — Z452 Encounter for adjustment and management of vascular access device: Secondary | ICD-10-CM | POA: Diagnosis not present

## 2020-11-14 DIAGNOSIS — N179 Acute kidney failure, unspecified: Secondary | ICD-10-CM | POA: Diagnosis not present

## 2020-11-16 DIAGNOSIS — J1282 Pneumonia due to coronavirus disease 2019: Secondary | ICD-10-CM | POA: Diagnosis not present

## 2020-11-16 DIAGNOSIS — J9621 Acute and chronic respiratory failure with hypoxia: Secondary | ICD-10-CM | POA: Diagnosis not present

## 2020-11-16 DIAGNOSIS — S82409A Unspecified fracture of shaft of unspecified fibula, initial encounter for closed fracture: Secondary | ICD-10-CM | POA: Diagnosis not present

## 2020-11-18 DIAGNOSIS — I5032 Chronic diastolic (congestive) heart failure: Secondary | ICD-10-CM | POA: Diagnosis not present

## 2020-11-18 DIAGNOSIS — J9621 Acute and chronic respiratory failure with hypoxia: Secondary | ICD-10-CM | POA: Diagnosis not present

## 2020-11-18 DIAGNOSIS — I130 Hypertensive heart and chronic kidney disease with heart failure and stage 1 through stage 4 chronic kidney disease, or unspecified chronic kidney disease: Secondary | ICD-10-CM | POA: Diagnosis not present

## 2020-11-18 DIAGNOSIS — J1282 Pneumonia due to coronavirus disease 2019: Secondary | ICD-10-CM | POA: Diagnosis not present

## 2020-11-18 DIAGNOSIS — N184 Chronic kidney disease, stage 4 (severe): Secondary | ICD-10-CM | POA: Diagnosis not present

## 2020-11-18 DIAGNOSIS — E1122 Type 2 diabetes mellitus with diabetic chronic kidney disease: Secondary | ICD-10-CM | POA: Diagnosis not present

## 2020-11-18 DIAGNOSIS — D631 Anemia in chronic kidney disease: Secondary | ICD-10-CM | POA: Diagnosis not present

## 2020-11-18 DIAGNOSIS — U071 COVID-19: Secondary | ICD-10-CM | POA: Diagnosis not present

## 2020-11-20 ENCOUNTER — Other Ambulatory Visit: Payer: Self-pay

## 2020-11-20 NOTE — Patient Outreach (Signed)
[place] [facility]) Care Management  11/20/2020  Latasha [name] November 01, 1955 [name]    EMMI-General Discharge RED ON EMMI ALERT Day # 4 Date: 11/17/2020 Red Alert Reason: "Lost interest in things? Ys"   Outreach attempt # 1 to patient. No answer after several rings and voicemail box full.        Plan: RN CM will make outreach attempt to patient within 3-4 business days.   Enzo Montgomery, RN,BSN,CCM [place] [facility] Telephonic Care Management Coordinator Direct Phone: [phone] Toll Free: [phone] Fax: [phone]

## 2020-11-21 ENCOUNTER — Other Ambulatory Visit: Payer: Self-pay

## 2020-11-21 DIAGNOSIS — D631 Anemia in chronic kidney disease: Secondary | ICD-10-CM | POA: Diagnosis not present

## 2020-11-21 DIAGNOSIS — I130 Hypertensive heart and chronic kidney disease with heart failure and stage 1 through stage 4 chronic kidney disease, or unspecified chronic kidney disease: Secondary | ICD-10-CM | POA: Diagnosis not present

## 2020-11-21 DIAGNOSIS — N184 Chronic kidney disease, stage 4 (severe): Secondary | ICD-10-CM | POA: Diagnosis not present

## 2020-11-21 DIAGNOSIS — J1282 Pneumonia due to coronavirus disease 2019: Secondary | ICD-10-CM | POA: Diagnosis not present

## 2020-11-21 DIAGNOSIS — J9621 Acute and chronic respiratory failure with hypoxia: Secondary | ICD-10-CM | POA: Diagnosis not present

## 2020-11-21 DIAGNOSIS — I5032 Chronic diastolic (congestive) heart failure: Secondary | ICD-10-CM | POA: Diagnosis not present

## 2020-11-21 DIAGNOSIS — U071 COVID-19: Secondary | ICD-10-CM | POA: Diagnosis not present

## 2020-11-21 DIAGNOSIS — S82421D Displaced transverse fracture of shaft of right fibula, subsequent encounter for closed fracture with routine healing: Secondary | ICD-10-CM | POA: Diagnosis not present

## 2020-11-21 DIAGNOSIS — D62 Acute posthemorrhagic anemia: Secondary | ICD-10-CM | POA: Diagnosis not present

## 2020-11-21 DIAGNOSIS — E1122 Type 2 diabetes mellitus with diabetic chronic kidney disease: Secondary | ICD-10-CM | POA: Diagnosis not present

## 2020-11-21 NOTE — Patient Outreach (Signed)
[place] [facility]) Care Management  11/21/2020  Latasha [name] 05-Jul-1956 [name]   EMMI-General Discharge RED ON EMMI ALERT Day # 4 Date: 11/17/2020 Red Alert Reason: "Lost interest in things? Yes"   Outreach attempt #2. No answer after multiple rings.     Plan: RN CM will make outreach attempt to patient within 3-4 business days.    Enzo Montgomery, RN,BSN,CCM [place] [facility] Telephonic Care Management Coordinator Direct Phone: [phone] Toll Free: [phone] Fax: [phone]

## 2020-11-23 ENCOUNTER — Other Ambulatory Visit: Payer: Self-pay

## 2020-11-23 NOTE — Patient Outreach (Signed)
[place] [facility]) Care Management  11/23/2020  ALETIA TOBAR Jun 21, 1956 [name]    EMMI-General Discharge RED ON EMMI ALERT Day #4 Date:11/17/2020 Red Alert Reason:"Lost interest in things? Yes"   Unsuccessful outreach attempt to patient.    Plan: Assigned RN CM will follow up with patient within 3-4 weeks.  Enzo Montgomery, RN,BSN,CCM [place] [facility] Telephonic Care Management Coordinator Direct Phone: [phone] Toll Free: [phone] Fax: [phone]

## 2020-11-24 ENCOUNTER — Ambulatory Visit (INDEPENDENT_AMBULATORY_CARE_PROVIDER_SITE_OTHER): Payer: Medicare HMO

## 2020-11-24 ENCOUNTER — Ambulatory Visit: Payer: Medicare HMO | Admitting: Orthopaedic Surgery

## 2020-11-24 ENCOUNTER — Ambulatory Visit: Payer: Medicare HMO

## 2020-11-24 ENCOUNTER — Encounter: Payer: Self-pay | Admitting: Orthopaedic Surgery

## 2020-11-24 ENCOUNTER — Other Ambulatory Visit: Payer: Self-pay | Admitting: *Deleted

## 2020-11-24 DIAGNOSIS — M25571 Pain in right ankle and joints of right foot: Secondary | ICD-10-CM

## 2020-11-24 NOTE — Patient Outreach (Signed)
[place] [facility]) Care Management  11/24/2020  Latasha [name] 23-Aug-1956 [id number]  Multidisciplinary Case Discussion   Date of Review: 11/24/20 Reason : Readmission  PCP : [place]: [employer]    Medical Info: Latasha [name] is a 65 y.o. female with medical history significant for chronic congestive heart failure with diastolic dysfunction, chronic kidney disease stage IV, diabetes mellitus, hypertension, and morbid obesity.  Patient normally lives alone in her home with her spouse but is independent with care.  She also has a daughter locally [place], who has pitched in to assist patient.       Admissions: Patient admitted on 10/22/20 with complaints 8 to 9 days of cough, fever, malaise, generalized weakness, intermittent vomiting, diarrhea and shortness of breath.  She did telehealth visit with PCP prior to admit who prescribed antibiotics which she completed prior to admit initially she felt that the antibiotics were helping but the last few days she just started feeling worse with coughing, not eating well, fatigued and worsening shortness of breath. She had two doses of the [place] vaccine with the last one in April. Her son tested positive for COVID and she had been around him.  She was found to have COVID pneumonia with hypoxia.   Patient discharged home with home Health PT.  Patient presented again to the ED on 11/08/20 with dark tarry stools, weakness, dizziness and falls.  She attempted to have a tele-visit with PCP on 11/07/20 but could not get one.  Patient found to have recurrent GI bleed with a hemoglobin of 4.  Patient also had right ankle fracture from fall.  EGD completed. Patient was given 6units of PRBC's and GI was consulted. Orthopedics were also consulted. CAM Walker boot and to follow up with orthopedics as an outpatient.  Patient home with support of family.  Currently daughter and sister present to assist patient.    Disposition: Patient  discharged home with home health.  RN CM will continue education and support.  Recommendation:  Follow up with patient provider regarding EMMI red reason " Lost interest in things". Will update assigned Case Manager Jon Billings, has scheduled patient  follow up call on 2/22.  Joylene Draft, RN, BSN  [place] Management Coordinator  [phone]- Mobile [phone]- Toll Free Main Office

## 2020-11-24 NOTE — Progress Notes (Signed)
Office Visit Note   Patient: Latasha [name]           Date of Birth: 1956/07/25           MRN: [name] Visit Date: 11/24/2020              Requested by: Shirline Frees, MD [address] [place],  [place] [address] PCP: Shirline Frees, MD   Assessment & Plan: Visit Diagnoses:  1. Pain in right ankle and joints of right foot     Plan: Continue cam boot and continue walker.  Return 4 weeks for final x-rays right ankle out of cam boot.  Follow-Up Instructions: Return in about 4 weeks (around 12/22/2020).   Orders:  Orders Placed This Encounter  Procedures  . XR Ankle Complete Right   No orders of the defined types were placed in this encounter.     Procedures: No procedures performed   Clinical Data: No additional findings.   Subjective: Chief Complaint  Patient presents with  . Right Ankle - Pain    HPI 65 year old female was in the hospital with COVID for 11-day she got out and then was home got lightheaded fell and sprained her left ankle and had a nondisplaced fibular fracture on the right ankle.  She is in a cam boot.  She recently fell going to the bathroom.  Cam boot is doing well she has diabetes with some neuropathy and states she is only having some soreness in her ankle but no significant pain.  Other problems include chronic stage IV kidney disease heart failure obesity insulin-dependent diabetes.  Review of Systems all the systems noncontributory to HPI.   Objective: Vital Signs: There were no vitals taken for this visit.  Physical Exam Constitutional:      Appearance: She is well-developed.  HENT:     Head: Normocephalic.     Right Ear: External ear normal.     Left Ear: External ear normal.  Eyes:     Pupils: Pupils are equal, round, and reactive to light.  Neck:     Thyroid: No thyromegaly.     Trachea: No tracheal deviation.  Cardiovascular:     Rate and Rhythm: Normal rate.  Pulmonary:     Effort: Pulmonary effort is  normal.  Abdominal:     Palpations: Abdomen is soft.  Skin:    General: Skin is warm and dry.  Neurological:     Mental Status: She is alert and oriented to person, place, and time.  Psychiatric:        Mood and Affect: Mood and affect normal.        Behavior: Behavior normal.     Ortho Exam patient is well maintained in cam boot.  No cellulitis.  No ulceration.  Opposite left ankle is only minimally tender over the lateral ankle ligaments negative anterior drawer. Specialty Comments:  No specialty comments available.  Imaging: XR Ankle Complete Right  Result Date: 11/24/2020 Three-view x-rays right ankle obtained and reviewed.  This shows nondisplaced fibular fracture with some interval healing in the last 3 weeks.  No displacement of the mortise.  Medial and posterior malleolus are intact. Impression: Right lateral malleolar fracture nondisplaced with interval healing.  Position is maintained.    PMFS History: Patient Active Problem List   Diagnosis Date Noted  . Fibula fracture 11/09/2020  . Pneumonia due to COVID-19 virus 10/22/2020  . Acute on chronic respiratory failure with hypoxia ([place]) 10/22/2020  . Cardiorenal  syndrome 03/05/2020  . Chest pain, rule out acute myocardial infarction 03/07/2018  . CKD (chronic kidney disease) stage 4, GFR 15-29 ml/min (HCC) 03/07/2018  . Hyperphosphatemia 06/09/2017  . Elevated ALT measurement 06/08/2017  . Hypertensive heart disease with acute on chronic diastolic congestive heart failure ([place])   . Bilateral leg edema   . Chronic diastolic CHF (congestive heart failure) ([place]) 06/07/2017  . Acute on chronic diastolic CHF (congestive heart failure) ([place]) 06/07/2017  . ARF (acute renal failure) ([place])   . Encephalopathy acute 05/15/2017  . Hyperosmolar non-ketotic state in patient with type 2 diabetes mellitus ([place]) 05/15/2017  . Chronic pain 05/15/2017  . Acute kidney injury superimposed on chronic kidney disease ([place])   .  Hyperlipemia 01/19/2014  . Palpitations 01/19/2014  . PVC's (premature ventricular contractions) 01/19/2014  . SCIATICA, RIGHT 12/12/2010  . GASTROESOPHAGEAL REFLUX DISEASE 02/09/2010  . LEG CRAMPS 02/09/2010  . Morbid obesity ([place]) 12/01/2009  . VITAMIN D DEFICIENCY 10/13/2009  . CONSTIPATION 10/13/2009  . LIVER FUNCTION TESTS, ABNORMAL, HX OF 10/13/2009  . Essential hypertension 08/24/2009  . SHOULDER PAIN, RIGHT, CHRONIC 08/24/2009  . Insulin-requiring or dependent type II diabetes mellitus ([place]) 10/08/1991   Past Medical History:  Diagnosis Date  . BPPV (benign paroxysmal positional vertigo)   . Chronic congestive heart failure with left ventricular diastolic dysfunction ([place])   . CKD (chronic kidney disease), stage IV ([place]) 05/15/2017  . Diabetes mellitus   . Hyperlipidemia   . Hypertension   . Left atrial enlargement   . Morbid obesity with BMI of 40.0-44.9, adult ([place])   . Palpitations     Family History  Problem Relation Age of Onset  . Heart disease Father   . Hypertension Father   . Alzheimer's disease Mother   . Diabetes Mother   . Diabetes Sister   . Dementia Other     Past Surgical History:  Procedure Laterality Date  . ABDOMINAL HYSTERECTOMY    . BIOPSY  11/09/2020   Procedure: BIOPSY;  Surgeon: Wilford Corner, MD;  Location: [facility];  Service: Endoscopy;;  . ESOPHAGOGASTRODUODENOSCOPY N/A 11/09/2020   Procedure: ESOPHAGOGASTRODUODENOSCOPY (EGD);  Surgeon: Wilford Corner, MD;  Location: [facility];  Service: Endoscopy;  Laterality: N/A;  . EYE SURGERY     cataract/left eye, right eye had bleeding in back  . IR FLUORO GUIDE CV LINE RIGHT  10/26/2020  . IR US GUIDE VASC ACCESS RIGHT  10/26/2020  . KNEE SURGERY    . LEFT HEART CATH AND CORONARY ANGIOGRAPHY N/A 03/09/2018   Procedure: LEFT HEART CATH AND CORONARY ANGIOGRAPHY;  Surgeon: Lorretta Harp, MD;  Location: [facility];  Service: Cardiovascular;  Laterality: N/A;  . TRIGGER FINGER  RELEASE     Social History   Occupational History  . Occupation: retired  Tobacco Use  . Smoking status: Never Smoker  . Smokeless tobacco: Never Used  Substance and Sexual Activity  . Alcohol use: Yes    Comment:  rare  . Drug use: No  . Sexual activity: Not on file

## 2020-11-27 ENCOUNTER — Ambulatory Visit (HOSPITAL_COMMUNITY)
Admission: RE | Admit: 2020-11-27 | Discharge: 2020-11-27 | Disposition: A | Discharge status: Home / Self Care | Payer: Medicare HMO | Source: Ambulatory Visit | Attending: Internal Medicine | Admitting: Internal Medicine

## 2020-11-27 ENCOUNTER — Other Ambulatory Visit: Payer: Self-pay

## 2020-11-27 VITALS — BP 125/52 | HR 69 | Temp 97.5°F | Resp 18

## 2020-11-27 DIAGNOSIS — N179 Acute kidney failure, unspecified: Secondary | ICD-10-CM | POA: Diagnosis not present

## 2020-11-27 DIAGNOSIS — N184 Chronic kidney disease, stage 4 (severe): Secondary | ICD-10-CM | POA: Diagnosis not present

## 2020-11-27 DIAGNOSIS — N189 Chronic kidney disease, unspecified: Secondary | ICD-10-CM | POA: Insufficient documentation

## 2020-11-27 LAB — POCT HEMOGLOBIN-HEMACUE: Hemoglobin: 9.8 g/dL — ABNORMAL LOW (ref 12.0–15.0)

## 2020-11-27 MED ORDER — EPOETIN ALFA-EPBX 10000 UNIT/ML IJ SOLN
INTRAMUSCULAR | Status: AC
  Filled 2020-11-27: qty 2

## 2020-11-27 MED ORDER — EPOETIN ALFA-EPBX 10000 UNIT/ML IJ SOLN
20000.0000 [IU] | INTRAMUSCULAR | Status: DC
  Administered 2020-11-27: 20000 [IU] via SUBCUTANEOUS

## 2020-11-27 NOTE — Discharge Instructions (Signed)

## 2020-11-28 ENCOUNTER — Other Ambulatory Visit: Payer: Self-pay

## 2020-11-28 DIAGNOSIS — D631 Anemia in chronic kidney disease: Secondary | ICD-10-CM | POA: Diagnosis not present

## 2020-11-28 DIAGNOSIS — J1282 Pneumonia due to coronavirus disease 2019: Secondary | ICD-10-CM | POA: Diagnosis not present

## 2020-11-28 DIAGNOSIS — I130 Hypertensive heart and chronic kidney disease with heart failure and stage 1 through stage 4 chronic kidney disease, or unspecified chronic kidney disease: Secondary | ICD-10-CM | POA: Diagnosis not present

## 2020-11-28 DIAGNOSIS — N184 Chronic kidney disease, stage 4 (severe): Secondary | ICD-10-CM | POA: Diagnosis not present

## 2020-11-28 DIAGNOSIS — I5032 Chronic diastolic (congestive) heart failure: Secondary | ICD-10-CM | POA: Diagnosis not present

## 2020-11-28 DIAGNOSIS — J9621 Acute and chronic respiratory failure with hypoxia: Secondary | ICD-10-CM | POA: Diagnosis not present

## 2020-11-28 DIAGNOSIS — U071 COVID-19: Secondary | ICD-10-CM | POA: Diagnosis not present

## 2020-11-28 DIAGNOSIS — E1122 Type 2 diabetes mellitus with diabetic chronic kidney disease: Secondary | ICD-10-CM | POA: Diagnosis not present

## 2020-11-28 NOTE — Patient Outreach (Signed)
[facility]) Care Management  11/28/2020  Latasha [name] 06/15/1956 [id number]   EMMI-General Discharge RED ON EMMI ALERT Day #4 Date:11/17/2020 Red Alert Reason:"Lost interest in things? Yes"  Telephone call to patient for follow up. Patient reports she is doing a lot better.  Addressed red alert.  Patient PHQ2=0 She states she is starting to feel like [name] again. Patient currently working with the therapist.  She denies any further problems.    Plan: RN CM will contact patient again in the month of March Patient agrees to follow-up and careplan.  Jone Baseman, RN, MSN [place] Management Care Management Coordinator Direct Line [phone] Cell [phone] Toll Free: [phone]  Fax: [phone]

## 2020-11-30 DIAGNOSIS — U071 COVID-19: Secondary | ICD-10-CM | POA: Diagnosis not present

## 2020-11-30 DIAGNOSIS — J1282 Pneumonia due to coronavirus disease 2019: Secondary | ICD-10-CM | POA: Diagnosis not present

## 2020-11-30 DIAGNOSIS — I130 Hypertensive heart and chronic kidney disease with heart failure and stage 1 through stage 4 chronic kidney disease, or unspecified chronic kidney disease: Secondary | ICD-10-CM | POA: Diagnosis not present

## 2020-11-30 DIAGNOSIS — I5032 Chronic diastolic (congestive) heart failure: Secondary | ICD-10-CM | POA: Diagnosis not present

## 2020-11-30 DIAGNOSIS — N184 Chronic kidney disease, stage 4 (severe): Secondary | ICD-10-CM | POA: Diagnosis not present

## 2020-11-30 DIAGNOSIS — D631 Anemia in chronic kidney disease: Secondary | ICD-10-CM | POA: Diagnosis not present

## 2020-11-30 DIAGNOSIS — J9621 Acute and chronic respiratory failure with hypoxia: Secondary | ICD-10-CM | POA: Diagnosis not present

## 2020-11-30 DIAGNOSIS — E1122 Type 2 diabetes mellitus with diabetic chronic kidney disease: Secondary | ICD-10-CM | POA: Diagnosis not present

## 2020-12-05 ENCOUNTER — Other Ambulatory Visit: Payer: Self-pay

## 2020-12-05 NOTE — Patient Outreach (Signed)
[facility]) Care Management  12/05/2020  DORIS GRAVOIS 12-Oct-1955 [id number]   Telephone call to patient for follow up. No answer.  Unable to leave a message.  Plan: RN CM will attempt again within 4 business days.   Jone Baseman, RN, MSN [place] Management Care Management Coordinator Direct Line [phone] Cell [phone] Toll Free: [phone]  Fax: [phone]

## 2020-12-07 DIAGNOSIS — F419 Anxiety disorder, unspecified: Secondary | ICD-10-CM | POA: Diagnosis not present

## 2020-12-07 DIAGNOSIS — I129 Hypertensive chronic kidney disease with stage 1 through stage 4 chronic kidney disease, or unspecified chronic kidney disease: Secondary | ICD-10-CM | POA: Diagnosis not present

## 2020-12-07 DIAGNOSIS — Z6841 Body Mass Index (BMI) 40.0 and over, adult: Secondary | ICD-10-CM | POA: Diagnosis not present

## 2020-12-07 DIAGNOSIS — N184 Chronic kidney disease, stage 4 (severe): Secondary | ICD-10-CM | POA: Diagnosis not present

## 2020-12-07 DIAGNOSIS — I5030 Unspecified diastolic (congestive) heart failure: Secondary | ICD-10-CM | POA: Diagnosis not present

## 2020-12-07 DIAGNOSIS — D649 Anemia, unspecified: Secondary | ICD-10-CM | POA: Diagnosis not present

## 2020-12-07 DIAGNOSIS — E1122 Type 2 diabetes mellitus with diabetic chronic kidney disease: Secondary | ICD-10-CM | POA: Diagnosis not present

## 2020-12-08 ENCOUNTER — Other Ambulatory Visit: Payer: Self-pay

## 2020-12-08 NOTE — Patient Outreach (Signed)
[facility]) Care Management  [place]  12/08/2020   Latasha [name] 19-Nov-1955 [id number]  Subjective: Telephone call to patient she reports feeling good.  She reports that her spouse sick though and gone to the MD to get checked for COVID.  She states he has had his booster. She denies feeling sick at all.  Denies symptoms of GI bleed.  She states home health continues to be involved.  Blood sugar today 130.  Advised to continue regimen and see physicians as scheduled.  She verbalized understanding.     Objective:   Encounter Medications:  Outpatient Encounter Medications as of 12/08/2020  Medication Sig  . acetaminophen (TYLENOL) 500 MG tablet Take 500 mg by mouth every 6 (six) hours as needed for mild pain, fever or headache.  . albuterol (PROVENTIL HFA;VENTOLIN HFA) 108 (90 Base) MCG/ACT inhaler Inhale 2 puffs into the lungs every 6 (six) hours as needed for wheezing or shortness of breath.  Marland Kitchen amLODipine (NORVASC) 10 MG tablet Take 1 tablet (10 mg total) by mouth daily.  Marland Kitchen atorvastatin (LIPITOR) 40 MG tablet Take 40 mg by mouth daily.   . carvedilol (COREG) 25 MG tablet Take 1 tablet (25 mg total) by mouth 2 (two) times daily with a meal.  . furosemide (LASIX) 80 MG tablet Take 1 tablet (80 mg total) by mouth daily.  . insulin aspart protamine- aspart (NOVOLOG MIX 70/30) (70-30) 100 UNIT/ML injection Inject 50-60 Units into the skin See admin instructions. Use 60 units every morning and use 50 units every evening  . nitroGLYCERIN (NITROSTAT) 0.4 MG SL tablet Place 1 tablet (0.4 mg total) under the tongue every 5 (five) minutes as needed for chest pain.  . pantoprazole (PROTONIX) 40 MG tablet Take 1 tablet (40 mg total) by mouth 2 (two) times daily for 30 days, THEN 1 tablet (40 mg total) daily.   No facility-administered encounter medications on file as of 12/08/2020.    Functional Status:  In your present state of health, do you have any difficulty performing  the following activities: 11/08/2020 11/08/2020  Hearing? - N  Vision? - N  Comment - -  Difficulty concentrating or making decisions? - N  Walking or climbing stairs? - Y  Comment - secondary to weakness  Dressing or bathing? - N  Doing errands, shopping? Y -  Conservation officer, nature and eating ? - -  Using the Toilet? - -  In the past six months, have you accidently leaked urine? - -  Do you have problems with loss of bowel control? - -  Managing your Medications? - -  Managing your Finances? - -  Housekeeping or managing your Housekeeping? - -  Comment - -  Some recent data might be hidden    Fall/Depression Screening: Fall Risk  11/03/2020 08/09/2016 01/18/2016  Falls in the past year? 1 Yes No  Number falls in past yr: 0 2 or more -  Injury with Fall? 0 No -  Follow up Falls prevention discussed Falls evaluation completed -   PHQ 2/9 Scores 11/28/2020 11/03/2020  PHQ - 2 Score 0 1    Assessment: Patient managing post COVID and GI bleed. Goals Addressed            This Visit's Progress   . COMPLETED: Follow My Treatment Plan- GI Bleed   On track    Timeframe:  Short-Term Goal Priority:  High Start Date:      11/13/20  Expected End Date:     01/04/21                  Follow Up Date 01/04/21  Patient continues to monitor for GI bleed.     Notes: 11/13/20 Reviewed signs of active GI  bleeding. 12/08/20 No active bleed    . THN-Make and Keep All Appointments   On track    Timeframe:  Long-Range Goal Priority:  High Start Date: 11/03/20                           Expected End Date:    06/06/21                Follow Up Date 01/04/21   - ask family or friend for a ride - keep a calendar with appointment dates    Why is this important?    Part of staying healthy is seeing the doctor for follow-up care.   If you forget your appointments, there are some things you can do to stay on track.    Notes: Patient does not drive. Patient's daughter takes her to  appointments.  [employer] given.  11/07/20 patient to make follow up with physician. 11/13/20 Patient to see PCP 11/22/20. 11/28/20 Patient seeing providers as scheduled.  12/08/20 recent kidney doctor visit went well.      . COMPLETED: THN-[place]ain My Quality of Life-Get back to my normal self   On track    Timeframe:  Short-Term Goal Priority:  High Start Date:  11/03/20                           Expected End Date:    01/04/21     Follow Up Date 01/04/21   - do one enjoyable thing every day    Why is this important?    Having a long-term illness can be scary.   It can also be stressful for you and your caregiver.   These steps may help.    Notes: Patient yearns to get back to normal and interact with her family.11/07/20- patient feels bad about slow recovery. Encouraged patient to hang in there. 11/13/20 Patient working with PT for strengthening. 11/28/20 Patient reports feeling a lot better. 12/08/20 Patient feeling normal self.    . THN-Monitor and Manage My Blood Sugar-Diabetes Type 2   On track    Timeframe:  Long-Range Goal Priority:  Medium Start Date:     11/03/20                        Expected End Date:  06/06/21 Follow Up Date 01/04/21   - check blood sugar if I feel it is too high or too low - take the blood sugar log to all doctor visits    Why is this important?    Checking your blood sugar at home helps to keep it from getting very high or very low.   Writing the results in a diary or log helps the doctor know how to care for you.   Your blood sugar log should have the time, date and the results.   Also, write down the amount of insulin or other medicine that you take.   Other information, like what you ate, exercise done and how you were feeling, will also be helpful.     Notes: 11/03/20 Blood sugar this am 109.  Patient checks sugars twice daily.  11/13/20 Patient continues to check sugars as prescribed.  12/08/20 blood sugar 130.  Keep up the great work!!        Plan: RN CM will contact patient again in the month of March. Follow-up:  Patient agrees to Care Plan and Follow-up.   Jone Baseman, RN, MSN [place] Management Care Management Coordinator Direct Line [phone] Cell [phone] Toll Free: [phone]  Fax: [phone]

## 2020-12-11 ENCOUNTER — Encounter (HOSPITAL_COMMUNITY)
Admission: RE | Admit: 2020-12-11 | Discharge: 2020-12-11 | Disposition: A | Discharge status: Home / Self Care | Payer: Medicare HMO | Source: Ambulatory Visit | Attending: Internal Medicine | Admitting: Internal Medicine

## 2020-12-11 ENCOUNTER — Other Ambulatory Visit: Payer: Self-pay

## 2020-12-11 VITALS — BP 144/60 | HR 70 | Resp 18

## 2020-12-11 DIAGNOSIS — N189 Chronic kidney disease, unspecified: Secondary | ICD-10-CM | POA: Insufficient documentation

## 2020-12-11 DIAGNOSIS — N179 Acute kidney failure, unspecified: Secondary | ICD-10-CM | POA: Insufficient documentation

## 2020-12-11 DIAGNOSIS — N184 Chronic kidney disease, stage 4 (severe): Secondary | ICD-10-CM | POA: Diagnosis not present

## 2020-12-11 LAB — RENAL FUNCTION PANEL
Albumin: 3.4 g/dL — ABNORMAL LOW (ref 3.5–5.0)
Anion gap: 11 (ref 5–15)
BUN: 52 mg/dL — ABNORMAL HIGH (ref 8–23)
CO2: 20 mmol/L — ABNORMAL LOW (ref 22–32)
Calcium: 8.6 mg/dL — ABNORMAL LOW (ref 8.9–10.3)
Chloride: 106 mmol/L (ref 98–111)
Creatinine, Ser: 2.78 mg/dL — ABNORMAL HIGH (ref 0.44–1.00)
GFR, Estimated: 19 mL/min — ABNORMAL LOW (ref >60)
Glucose, Bld: 201 mg/dL — ABNORMAL HIGH (ref 70–99)
Phosphorus: 4.2 mg/dL (ref 2.5–4.6)
Potassium: 4.6 mmol/L (ref 3.5–5.1)
Sodium: 137 mmol/L (ref 135–145)

## 2020-12-11 LAB — POCT HEMOGLOBIN-HEMACUE: Hemoglobin: 9.8 g/dL — ABNORMAL LOW (ref 12.0–15.0)

## 2020-12-11 MED ORDER — EPOETIN ALFA-EPBX 10000 UNIT/ML IJ SOLN
INTRAMUSCULAR | Status: AC
  Filled 2020-12-11: qty 2

## 2020-12-11 MED ORDER — EPOETIN ALFA-EPBX 10000 UNIT/ML IJ SOLN
20000.0000 [IU] | INTRAMUSCULAR | Status: DC
  Administered 2020-12-11: 20000 [IU] via SUBCUTANEOUS

## 2020-12-12 LAB — PTH, INTACT AND CALCIUM
Calcium, Total (PTH): 8.4 mg/dL — ABNORMAL LOW (ref 8.7–10.3)
PTH: 78 pg/mL — ABNORMAL HIGH (ref 15–65)

## 2020-12-14 DIAGNOSIS — J9621 Acute and chronic respiratory failure with hypoxia: Secondary | ICD-10-CM | POA: Diagnosis not present

## 2020-12-14 DIAGNOSIS — J1282 Pneumonia due to coronavirus disease 2019: Secondary | ICD-10-CM | POA: Diagnosis not present

## 2020-12-14 DIAGNOSIS — S82409A Unspecified fracture of shaft of unspecified fibula, initial encounter for closed fracture: Secondary | ICD-10-CM | POA: Diagnosis not present

## 2020-12-22 ENCOUNTER — Other Ambulatory Visit: Payer: Self-pay

## 2020-12-22 ENCOUNTER — Encounter: Payer: Self-pay | Admitting: Orthopaedic Surgery

## 2020-12-22 ENCOUNTER — Ambulatory Visit (INDEPENDENT_AMBULATORY_CARE_PROVIDER_SITE_OTHER): Payer: Medicare HMO

## 2020-12-22 ENCOUNTER — Ambulatory Visit (INDEPENDENT_AMBULATORY_CARE_PROVIDER_SITE_OTHER): Payer: Medicare HMO | Admitting: Orthopaedic Surgery

## 2020-12-22 VITALS — BP 153/65 | HR 72

## 2020-12-22 DIAGNOSIS — M25571 Pain in right ankle and joints of right foot: Secondary | ICD-10-CM

## 2020-12-22 NOTE — Progress Notes (Signed)
Office Visit Note   Patient: Latasha [name]           Date of Birth: [birth year]           MRN: [name] Visit Date: 12/22/2020              Requested by: Shirline Frees, MD [address] [place],  [place] [address] PCP: Shirline Frees, MD   Assessment & Plan: Visit Diagnoses:  1. Pain in right ankle and joints of right foot     Plan: Nondisplaced fibular fracture with interval healing.  She can work her way out of the cam boot into a tennis shoe elevate her legs for the swelling.  Follow-Up Instructions: No follow-ups on file.   Orders:  Orders Placed This Encounter  Procedures  . XR Ankle Complete Right   No orders of the defined types were placed in this encounter.     Procedures: No procedures performed   Clinical Data: No additional findings.   Subjective: Chief Complaint  Patient presents with  . Right Ankle - Pain    HPI follow-up fall at home when she was not using her walker with nondisplaced fibular fracture.  Treated in a cam boot.  She denies pain.  She sprained her opposite ankle has some swelling of both lower extremities which may be related to her stage IV kidney disease as well as her ankle injury.  She denies any skin problems she states the discomfort in her ankle is been minimal.  Review of Systems systems updated unchanged from 11/24/2020 office visit.   Objective: Vital Signs: BP (!) 153/65   Pulse 72   Physical Exam Constitutional:      Appearance: She is well-developed.  HENT:     Head: Normocephalic.     Right Ear: External ear normal.     Left Ear: External ear normal.  Eyes:     Pupils: Pupils are equal, round, and reactive to light.  Neck:     Thyroid: No thyromegaly.     Trachea: No tracheal deviation.  Cardiovascular:     Rate and Rhythm: Normal rate.  Pulmonary:     Effort: Pulmonary effort is normal.  Abdominal:     Palpations: Abdomen is soft.  Skin:    General: Skin is warm and dry.   Neurological:     Mental Status: She is alert and oriented to person, place, and time.  Psychiatric:        Behavior: Behavior normal.     Ortho Exam trace bilateral lower extremity edema.  No tenderness over the fibula.  Good ankle plantar flexion dorsiflexion without pain. Specialty Comments:  No specialty comments available.  Imaging: No results found.   PMFS History: Patient Active Problem List   Diagnosis Date Noted  . Fibula fracture 11/09/2020  . Pneumonia due to COVID-19 virus 10/22/2020  . Acute on chronic respiratory failure with hypoxia ([place]) 10/22/2020  . Cardiorenal syndrome 03/05/2020  . Chest pain, rule out acute myocardial infarction 03/07/2018  . CKD (chronic kidney disease) stage 4, GFR 15-29 ml/min (HCC) 03/07/2018  . Hyperphosphatemia 06/09/2017  . Elevated ALT measurement 06/08/2017  . Hypertensive heart disease with acute on chronic diastolic congestive heart failure ([place])   . Bilateral leg edema   . Chronic diastolic CHF (congestive heart failure) ([place]) 06/07/2017  . Acute on chronic diastolic CHF (congestive heart failure) ([place]) 06/07/2017  . ARF (acute renal failure) ([place])   . Encephalopathy acute 05/15/2017  .  Hyperosmolar non-ketotic state in patient with type 2 diabetes mellitus ([place]) 05/15/2017  . Chronic pain 05/15/2017  . Acute kidney injury superimposed on chronic kidney disease ([place])   . Hyperlipemia 01/19/2014  . Palpitations 01/19/2014  . PVC's (premature ventricular contractions) 01/19/2014  . SCIATICA, RIGHT 12/12/2010  . GASTROESOPHAGEAL REFLUX DISEASE 02/09/2010  . LEG CRAMPS 02/09/2010  . Morbid obesity ([place]) 12/01/2009  . VITAMIN D DEFICIENCY 10/13/2009  . CONSTIPATION 10/13/2009  . LIVER FUNCTION TESTS, ABNORMAL, HX OF 10/13/2009  . Essential hypertension 08/24/2009  . SHOULDER PAIN, RIGHT, CHRONIC 08/24/2009  . Insulin-requiring or dependent type II diabetes mellitus ([place]) 10/08/1991   Past Medical History:  Diagnosis Date   . BPPV (benign paroxysmal positional vertigo)   . Chronic congestive heart failure with left ventricular diastolic dysfunction ([place])   . CKD (chronic kidney disease), stage IV ([place]) 05/15/2017  . Diabetes mellitus   . Hyperlipidemia   . Hypertension   . Left atrial enlargement   . Morbid obesity with BMI of 40.0-44.9, adult ([place])   . Palpitations     Family History  Problem Relation Age of Onset  . Heart disease Father   . Hypertension Father   . Alzheimer's disease Mother   . Diabetes Mother   . Diabetes Sister   . Dementia Other     Past Surgical History:  Procedure Laterality Date  . ABDOMINAL HYSTERECTOMY    . BIOPSY  11/09/2020   Procedure: BIOPSY;  Surgeon: Wilford Corner, MD;  Location: [facility];  Service: Endoscopy;;  . ESOPHAGOGASTRODUODENOSCOPY N/A 11/09/2020   Procedure: ESOPHAGOGASTRODUODENOSCOPY (EGD);  Surgeon: Wilford Corner, MD;  Location: [facility];  Service: Endoscopy;  Laterality: N/A;  . EYE SURGERY     cataract/left eye, right eye had bleeding in back  . IR FLUORO GUIDE CV LINE RIGHT  10/26/2020  . IR US GUIDE VASC ACCESS RIGHT  10/26/2020  . KNEE SURGERY    . LEFT HEART CATH AND CORONARY ANGIOGRAPHY N/A 03/09/2018   Procedure: LEFT HEART CATH AND CORONARY ANGIOGRAPHY;  Surgeon: Lorretta Harp, MD;  Location: [facility];  Service: Cardiovascular;  Laterality: N/A;  . TRIGGER FINGER RELEASE     Social History   Occupational History  . Occupation: retired  Tobacco Use  . Smoking status: Never Smoker  . Smokeless tobacco: Never Used  Substance and Sexual Activity  . Alcohol use: Yes    Comment:  rare  . Drug use: No  . Sexual activity: Not on file

## 2020-12-25 ENCOUNTER — Encounter (HOSPITAL_COMMUNITY)
Admission: RE | Admit: 2020-12-25 | Discharge: 2020-12-25 | Disposition: A | Discharge status: Home / Self Care | Payer: Medicare HMO | Source: Ambulatory Visit | Attending: Internal Medicine | Admitting: Internal Medicine

## 2020-12-25 ENCOUNTER — Encounter (HOSPITAL_COMMUNITY): Payer: Medicare HMO

## 2020-12-25 ENCOUNTER — Other Ambulatory Visit: Payer: Self-pay

## 2020-12-25 VITALS — BP 127/63 | HR 60 | Temp 97.7°F | Resp 18

## 2020-12-25 DIAGNOSIS — E113513 Type 2 diabetes mellitus with proliferative diabetic retinopathy with macular edema, bilateral: Secondary | ICD-10-CM | POA: Diagnosis not present

## 2020-12-25 DIAGNOSIS — N184 Chronic kidney disease, stage 4 (severe): Secondary | ICD-10-CM | POA: Diagnosis not present

## 2020-12-25 DIAGNOSIS — N189 Chronic kidney disease, unspecified: Secondary | ICD-10-CM | POA: Diagnosis not present

## 2020-12-25 DIAGNOSIS — N179 Acute kidney failure, unspecified: Secondary | ICD-10-CM

## 2020-12-25 DIAGNOSIS — H35372 Puckering of macula, left eye: Secondary | ICD-10-CM | POA: Diagnosis not present

## 2020-12-25 DIAGNOSIS — H3582 Retinal ischemia: Secondary | ICD-10-CM | POA: Diagnosis not present

## 2020-12-25 DIAGNOSIS — H4312 Vitreous hemorrhage, left eye: Secondary | ICD-10-CM | POA: Diagnosis not present

## 2020-12-25 LAB — IRON AND TIBC
Iron: 25 ug/dL — ABNORMAL LOW (ref 28–170)
Saturation Ratios: 9 % — ABNORMAL LOW (ref 10.4–31.8)
TIBC: 267 ug/dL (ref 250–450)
UIBC: 242 ug/dL

## 2020-12-25 LAB — POCT HEMOGLOBIN-HEMACUE: Hemoglobin: 9.8 g/dL — ABNORMAL LOW (ref 12.0–15.0)

## 2020-12-25 LAB — FERRITIN: Ferritin: 244 ng/mL (ref 11–307)

## 2020-12-25 MED ORDER — EPOETIN ALFA-EPBX 2000 UNIT/ML IJ SOLN
INTRAMUSCULAR | Status: AC
  Administered 2020-12-25: 2000 [IU] via SUBCUTANEOUS
  Filled 2020-12-25: qty 1

## 2020-12-25 MED ORDER — EPOETIN ALFA-EPBX 10000 UNIT/ML IJ SOLN
INTRAMUSCULAR | Status: AC
  Administered 2020-12-25: 20000 [IU] via SUBCUTANEOUS
  Filled 2020-12-25: qty 2

## 2020-12-25 MED ORDER — EPOETIN ALFA-EPBX 40000 UNIT/ML IJ SOLN: 25000.0000 [IU] | INTRAMUSCULAR | Status: DC

## 2020-12-25 MED ORDER — EPOETIN ALFA-EPBX 3000 UNIT/ML IJ SOLN
INTRAMUSCULAR | Status: AC
  Administered 2020-12-25: 3000 [IU] via SUBCUTANEOUS
  Filled 2020-12-25: qty 1

## 2020-12-28 ENCOUNTER — Other Ambulatory Visit: Payer: Self-pay

## 2020-12-28 NOTE — Patient Outreach (Signed)
[facility]) Care Management  12/28/2020  CAIRI SOBOTKA 09-12-1956 [id number]   Telephone call to patient for follow up. No answer.  Unable to leave a message.   Plan: RN CM will attempt patient again in the month of April.   Jone Baseman, RN, MSN [place] Management Care Management Coordinator Direct Line [phone] Cell [phone] Toll Free: [phone]  Fax: [phone]

## 2020-12-29 ENCOUNTER — Ambulatory Visit: Payer: Self-pay

## 2021-01-01 DIAGNOSIS — J45909 Unspecified asthma, uncomplicated: Secondary | ICD-10-CM | POA: Diagnosis not present

## 2021-01-01 DIAGNOSIS — G629 Polyneuropathy, unspecified: Secondary | ICD-10-CM | POA: Diagnosis not present

## 2021-01-01 DIAGNOSIS — N184 Chronic kidney disease, stage 4 (severe): Secondary | ICD-10-CM | POA: Diagnosis not present

## 2021-01-01 DIAGNOSIS — J9621 Acute and chronic respiratory failure with hypoxia: Secondary | ICD-10-CM | POA: Diagnosis not present

## 2021-01-01 DIAGNOSIS — I5032 Chronic diastolic (congestive) heart failure: Secondary | ICD-10-CM | POA: Diagnosis not present

## 2021-01-01 DIAGNOSIS — M7989 Other specified soft tissue disorders: Secondary | ICD-10-CM | POA: Diagnosis not present

## 2021-01-01 DIAGNOSIS — R0602 Shortness of breath: Secondary | ICD-10-CM | POA: Diagnosis not present

## 2021-01-01 DIAGNOSIS — I10 Essential (primary) hypertension: Secondary | ICD-10-CM | POA: Diagnosis not present

## 2021-01-01 DIAGNOSIS — R0683 Snoring: Secondary | ICD-10-CM | POA: Diagnosis not present

## 2021-01-05 ENCOUNTER — Other Ambulatory Visit: Payer: Self-pay | Admitting: Physician Assistant

## 2021-01-05 ENCOUNTER — Ambulatory Visit
Admission: RE | Admit: 2021-01-05 | Discharge: 2021-01-05 | Disposition: A | Discharge status: Home / Self Care | Payer: Medicare HMO | Source: Ambulatory Visit | Attending: Physician Assistant | Admitting: Physician Assistant

## 2021-01-05 DIAGNOSIS — K279 Peptic ulcer, site unspecified, unspecified as acute or chronic, without hemorrhage or perforation: Secondary | ICD-10-CM | POA: Diagnosis not present

## 2021-01-05 DIAGNOSIS — K5900 Constipation, unspecified: Secondary | ICD-10-CM | POA: Diagnosis not present

## 2021-01-05 DIAGNOSIS — R195 Other fecal abnormalities: Secondary | ICD-10-CM | POA: Diagnosis not present

## 2021-01-05 DIAGNOSIS — R0602 Shortness of breath: Secondary | ICD-10-CM | POA: Diagnosis not present

## 2021-01-05 DIAGNOSIS — D500 Iron deficiency anemia secondary to blood loss (chronic): Secondary | ICD-10-CM | POA: Diagnosis not present

## 2021-01-05 DIAGNOSIS — K922 Gastrointestinal hemorrhage, unspecified: Secondary | ICD-10-CM | POA: Diagnosis not present

## 2021-01-08 ENCOUNTER — Other Ambulatory Visit: Payer: Self-pay

## 2021-01-08 ENCOUNTER — Ambulatory Visit (HOSPITAL_COMMUNITY)
Admission: RE | Admit: 2021-01-08 | Discharge: 2021-01-08 | Disposition: A | Discharge status: Home / Self Care | Payer: Medicare HMO | Source: Ambulatory Visit | Attending: Internal Medicine | Admitting: Internal Medicine

## 2021-01-08 VITALS — BP 166/69 | HR 67 | Temp 97.9°F | Resp 18

## 2021-01-08 DIAGNOSIS — N189 Chronic kidney disease, unspecified: Secondary | ICD-10-CM | POA: Diagnosis not present

## 2021-01-08 DIAGNOSIS — N179 Acute kidney failure, unspecified: Secondary | ICD-10-CM | POA: Diagnosis not present

## 2021-01-08 DIAGNOSIS — N184 Chronic kidney disease, stage 4 (severe): Secondary | ICD-10-CM

## 2021-01-08 LAB — POCT HEMOGLOBIN-HEMACUE: Hemoglobin: 10.2 g/dL — ABNORMAL LOW (ref 12.0–15.0)

## 2021-01-08 MED ORDER — EPOETIN ALFA-EPBX 3000 UNIT/ML IJ SOLN
INTRAMUSCULAR | Status: AC
  Administered 2021-01-08: 3000 [IU]
  Filled 2021-01-08: qty 1

## 2021-01-08 MED ORDER — EPOETIN ALFA-EPBX 10000 UNIT/ML IJ SOLN
INTRAMUSCULAR | Status: AC
  Administered 2021-01-08: 20000 [IU]
  Filled 2021-01-08: qty 2

## 2021-01-08 MED ORDER — EPOETIN ALFA-EPBX 40000 UNIT/ML IJ SOLN: 25000.0000 [IU] | INTRAMUSCULAR | Status: DC

## 2021-01-08 MED ORDER — EPOETIN ALFA-EPBX 2000 UNIT/ML IJ SOLN
INTRAMUSCULAR | Status: AC
  Administered 2021-01-08: 2000 [IU]
  Filled 2021-01-08: qty 1

## 2021-01-10 ENCOUNTER — Other Ambulatory Visit: Payer: Self-pay

## 2021-01-10 NOTE — Patient Instructions (Signed)
Goals Addressed            This Visit's Progress   . THN-Make and Keep All Appointments   On track    Timeframe:  Long-Range Goal Priority:  High Start Date: 11/03/20                           Expected End Date:    06/06/21                Follow Up Date 03/06/21   - call to cancel if needed    Why is this important?    Part of staying healthy is seeing the doctor for follow-up care.   If you forget your appointments, there are some things you can do to stay on track.    Notes: Patient does not drive. Patient's daughter takes her to appointments.  [employer] given.  11/07/20 patient to make follow up with physician. 11/13/20 Patient to see PCP 11/22/20. 11/28/20 Patient seeing providers as scheduled.  12/08/20 recent kidney doctor visit went well.   01/10/21 Continues to see physicians as scheduled.      . THN-Monitor and Manage My Blood Sugar-Diabetes Type 2   On track    Timeframe:  Long-Range Goal Priority:  Medium Start Date:     11/03/20                        Expected End Date:  06/06/21  Follow Up Date 03/06/21  - check blood sugar before and after exercise - take the blood sugar log to all doctor visits    Why is this important?    Checking your blood sugar at home helps to keep it from getting very high or very low.   Writing the results in a diary or log helps the doctor know how to care for you.   Your blood sugar log should have the time, date and the results.   Also, write down the amount of insulin or other medicine that you take.   Other information, like what you ate, exercise done and how you were feeling, will also be helpful.     Notes: 11/03/20 Blood sugar this am 109.  Patient checks sugars twice daily.  11/13/20 Patient continues to check sugars as prescribed.  12/08/20 blood sugar 130.  Keep up the great work!! 01/10/21 Reiterated importance of sugar monitoring.  Keep up the great work!!

## 2021-01-10 NOTE — Patient Outreach (Signed)
[facility]) Care Management  [place]  01/10/2021   Latasha [name] Dec 14, 1955 [id number]  Subjective: Telephone call to patient for follow up. Patient reports there is question whether her kidney function has declined. She states she is going to have labs re-done.  She reports that her fracture to her foot is healed now. Patient blood sugar was 194 this am but patient admits to eating a late night snack. Discussed importance of diabetes management and how it relates to kidney function. She verbalized understanding.      Objective:   Encounter Medications:  Outpatient Encounter Medications as of 01/10/2021  Medication Sig  . acetaminophen (TYLENOL) 500 MG tablet Take 500 mg by mouth every 6 (six) hours as needed for mild pain, fever or headache.  . albuterol (PROVENTIL HFA;VENTOLIN HFA) 108 (90 Base) MCG/ACT inhaler Inhale 2 puffs into the lungs every 6 (six) hours as needed for wheezing or shortness of breath.  Marland Kitchen amLODipine (NORVASC) 10 MG tablet Take 1 tablet (10 mg total) by mouth daily.  Marland Kitchen atorvastatin (LIPITOR) 40 MG tablet Take 40 mg by mouth daily.   . carvedilol (COREG) 25 MG tablet Take 1 tablet (25 mg total) by mouth 2 (two) times daily with a meal.  . furosemide (LASIX) 80 MG tablet TAKE 1 TABLET (80 MG TOTAL) BY MOUTH DAILY.  Marland Kitchen insulin aspart protamine- aspart (NOVOLOG MIX 70/30) (70-30) 100 UNIT/ML injection Inject 50-60 Units into the skin See admin instructions. Use 60 units every morning and use 50 units every evening  . nitroGLYCERIN (NITROSTAT) 0.4 MG SL tablet Place 1 tablet (0.4 mg total) under the tongue every 5 (five) minutes as needed for chest pain.  . pantoprazole (PROTONIX) 40 MG tablet Take 1 tablet (40 mg total) by mouth 2 (two) times daily for 30 days, THEN 1 tablet (40 mg total) daily.   No facility-administered encounter medications on file as of 01/10/2021.    Functional Status:  In your present state of health, do you have any  difficulty performing the following activities: 01/10/2021 11/08/2020  Hearing? N -  Vision? Y -  Comment trouble with bright lights/previous eye surgery -  Difficulty concentrating or making decisions? N -  Walking or climbing stairs? Y -  Comment shortness of breath -  Dressing or bathing? N -  Doing errands, shopping? N Y  Conservation officer, nature and eating ? N -  Using the Toilet? N -  In the past six months, have you accidently leaked urine? N -  Do you have problems with loss of bowel control? N -  Managing your Medications? N -  Managing your Finances? N -  Housekeeping or managing your Housekeeping? Y -  Comment family helps -  Some recent data might be hidden    Fall/Depression Screening: Fall Risk  01/10/2021 11/03/2020 08/09/2016  Falls in the past year? 1 1 Yes  Number falls in past yr: 0 0 2 or more  Injury with Fall? 0 0 No  Risk for fall due to : History of fall(s) - -  Follow up Falls prevention discussed Falls prevention discussed Falls evaluation completed   PHQ 2/9 Scores 01/10/2021 11/28/2020 11/03/2020  PHQ - 2 Score 0 0 1    Assessment:  Goals Addressed            This Visit's Progress   . THN-Make and Keep All Appointments   On track    Timeframe:  Long-Range Goal Priority:  High Start Date: 11/03/20  Expected End Date:    06/06/21                Follow Up Date 03/06/21   - call to cancel if needed    Why is this important?    Part of staying healthy is seeing the doctor for follow-up care.   If you forget your appointments, there are some things you can do to stay on track.    Notes: Patient does not drive. Patient's daughter takes her to appointments.  [employer] given.  11/07/20 patient to make follow up with physician. 11/13/20 Patient to see PCP 11/22/20. 11/28/20 Patient seeing providers as scheduled.  12/08/20 recent kidney doctor visit went well.   01/10/21 Continues to see physicians as scheduled.      . THN-Monitor  and Manage My Blood Sugar-Diabetes Type 2   On track    Timeframe:  Long-Range Goal Priority:  Medium Start Date:     11/03/20                        Expected End Date:  06/06/21  Follow Up Date 03/06/21  - check blood sugar before and after exercise - take the blood sugar log to all doctor visits    Why is this important?    Checking your blood sugar at home helps to keep it from getting very high or very low.   Writing the results in a diary or log helps the doctor know how to care for you.   Your blood sugar log should have the time, date and the results.   Also, write down the amount of insulin or other medicine that you take.   Other information, like what you ate, exercise done and how you were feeling, will also be helpful.     Notes: 11/03/20 Blood sugar this am 109.  Patient checks sugars twice daily.  11/13/20 Patient continues to check sugars as prescribed.  12/08/20 blood sugar 130.  Keep up the great work!! 01/10/21 Reiterated importance of sugar monitoring.  Keep up the great work!!       Plan: RN CM will follow up next month. Follow-up:  Patient agrees to Care Plan and Follow-up.   Jone Baseman, RN, MSN [facility] Care Management Coordinator Direct Line [phone] Cell [phone] Toll Free: [phone]  Fax: [phone]

## 2021-01-14 DIAGNOSIS — S82409A Unspecified fracture of shaft of unspecified fibula, initial encounter for closed fracture: Secondary | ICD-10-CM | POA: Diagnosis not present

## 2021-01-14 DIAGNOSIS — J9621 Acute and chronic respiratory failure with hypoxia: Secondary | ICD-10-CM | POA: Diagnosis not present

## 2021-01-14 DIAGNOSIS — J1282 Pneumonia due to coronavirus disease 2019: Secondary | ICD-10-CM | POA: Diagnosis not present

## 2021-01-15 DIAGNOSIS — D500 Iron deficiency anemia secondary to blood loss (chronic): Secondary | ICD-10-CM | POA: Diagnosis not present

## 2021-01-15 DIAGNOSIS — I5032 Chronic diastolic (congestive) heart failure: Secondary | ICD-10-CM | POA: Diagnosis not present

## 2021-01-15 DIAGNOSIS — R0683 Snoring: Secondary | ICD-10-CM | POA: Diagnosis not present

## 2021-01-15 DIAGNOSIS — I10 Essential (primary) hypertension: Secondary | ICD-10-CM | POA: Diagnosis not present

## 2021-01-15 DIAGNOSIS — G4721 Circadian rhythm sleep disorder, delayed sleep phase type: Secondary | ICD-10-CM | POA: Diagnosis not present

## 2021-01-15 DIAGNOSIS — R0681 Apnea, not elsewhere classified: Secondary | ICD-10-CM | POA: Diagnosis not present

## 2021-01-15 DIAGNOSIS — G4719 Other hypersomnia: Secondary | ICD-10-CM | POA: Diagnosis not present

## 2021-01-15 DIAGNOSIS — N184 Chronic kidney disease, stage 4 (severe): Secondary | ICD-10-CM | POA: Diagnosis not present

## 2021-01-15 DIAGNOSIS — E1140 Type 2 diabetes mellitus with diabetic neuropathy, unspecified: Secondary | ICD-10-CM | POA: Diagnosis not present

## 2021-01-17 DIAGNOSIS — E1149 Type 2 diabetes mellitus with other diabetic neurological complication: Secondary | ICD-10-CM | POA: Diagnosis not present

## 2021-01-17 DIAGNOSIS — E1169 Type 2 diabetes mellitus with other specified complication: Secondary | ICD-10-CM | POA: Diagnosis not present

## 2021-01-17 DIAGNOSIS — E1122 Type 2 diabetes mellitus with diabetic chronic kidney disease: Secondary | ICD-10-CM | POA: Diagnosis not present

## 2021-01-17 DIAGNOSIS — N184 Chronic kidney disease, stage 4 (severe): Secondary | ICD-10-CM | POA: Diagnosis not present

## 2021-01-17 DIAGNOSIS — E11319 Type 2 diabetes mellitus with unspecified diabetic retinopathy without macular edema: Secondary | ICD-10-CM | POA: Diagnosis not present

## 2021-01-17 DIAGNOSIS — Z794 Long term (current) use of insulin: Secondary | ICD-10-CM | POA: Diagnosis not present

## 2021-01-17 DIAGNOSIS — E1165 Type 2 diabetes mellitus with hyperglycemia: Secondary | ICD-10-CM | POA: Diagnosis not present

## 2021-01-17 DIAGNOSIS — E785 Hyperlipidemia, unspecified: Secondary | ICD-10-CM | POA: Diagnosis not present

## 2021-01-17 DIAGNOSIS — I129 Hypertensive chronic kidney disease with stage 1 through stage 4 chronic kidney disease, or unspecified chronic kidney disease: Secondary | ICD-10-CM | POA: Diagnosis not present

## 2021-01-22 ENCOUNTER — Encounter (HOSPITAL_COMMUNITY): Payer: Medicare HMO

## 2021-01-29 ENCOUNTER — Other Ambulatory Visit: Payer: Self-pay

## 2021-01-29 ENCOUNTER — Encounter (HOSPITAL_COMMUNITY)
Admission: RE | Admit: 2021-01-29 | Discharge: 2021-01-29 | Disposition: A | Discharge status: Home / Self Care | Payer: Medicare HMO | Source: Ambulatory Visit | Attending: Internal Medicine | Admitting: Internal Medicine

## 2021-01-29 VITALS — BP 152/55 | HR 66

## 2021-01-29 DIAGNOSIS — N184 Chronic kidney disease, stage 4 (severe): Secondary | ICD-10-CM

## 2021-01-29 DIAGNOSIS — N189 Chronic kidney disease, unspecified: Secondary | ICD-10-CM | POA: Insufficient documentation

## 2021-01-29 DIAGNOSIS — N179 Acute kidney failure, unspecified: Secondary | ICD-10-CM

## 2021-01-29 LAB — IRON AND TIBC
Iron: 96 ug/dL (ref 28–170)
Saturation Ratios: 31 % (ref 10.4–31.8)
TIBC: 309 ug/dL (ref 250–450)
UIBC: 213 ug/dL

## 2021-01-29 LAB — FERRITIN: Ferritin: 231 ng/mL (ref 11–307)

## 2021-01-29 LAB — POCT HEMOGLOBIN-HEMACUE: Hemoglobin: 11.7 g/dL — ABNORMAL LOW (ref 12.0–15.0)

## 2021-01-29 MED ORDER — EPOETIN ALFA-EPBX 2000 UNIT/ML IJ SOLN
INTRAMUSCULAR | Status: AC
  Administered 2021-01-29: 2000 [IU] via SUBCUTANEOUS
  Filled 2021-01-29: qty 1

## 2021-01-29 MED ORDER — EPOETIN ALFA-EPBX 3000 UNIT/ML IJ SOLN
INTRAMUSCULAR | Status: AC
  Administered 2021-01-29: 3000 [IU] via SUBCUTANEOUS
  Filled 2021-01-29: qty 1

## 2021-01-29 MED ORDER — EPOETIN ALFA-EPBX 10000 UNIT/ML IJ SOLN
INTRAMUSCULAR | Status: AC
  Administered 2021-01-29: 20000 [IU] via SUBCUTANEOUS
  Filled 2021-01-29: qty 2

## 2021-01-29 MED ORDER — EPOETIN ALFA-EPBX 40000 UNIT/ML IJ SOLN: 25000.0000 [IU] | INTRAMUSCULAR | Status: DC

## 2021-01-31 DIAGNOSIS — G4733 Obstructive sleep apnea (adult) (pediatric): Secondary | ICD-10-CM | POA: Diagnosis not present

## 2021-01-31 DIAGNOSIS — R195 Other fecal abnormalities: Secondary | ICD-10-CM | POA: Diagnosis not present

## 2021-02-05 ENCOUNTER — Encounter (HOSPITAL_COMMUNITY): Payer: Medicare HMO

## 2021-02-05 DIAGNOSIS — I5030 Unspecified diastolic (congestive) heart failure: Secondary | ICD-10-CM | POA: Diagnosis not present

## 2021-02-05 DIAGNOSIS — Z6841 Body Mass Index (BMI) 40.0 and over, adult: Secondary | ICD-10-CM | POA: Diagnosis not present

## 2021-02-05 DIAGNOSIS — F419 Anxiety disorder, unspecified: Secondary | ICD-10-CM | POA: Diagnosis not present

## 2021-02-05 DIAGNOSIS — D649 Anemia, unspecified: Secondary | ICD-10-CM | POA: Diagnosis not present

## 2021-02-05 DIAGNOSIS — N185 Chronic kidney disease, stage 5: Secondary | ICD-10-CM | POA: Diagnosis not present

## 2021-02-05 DIAGNOSIS — I120 Hypertensive chronic kidney disease with stage 5 chronic kidney disease or end stage renal disease: Secondary | ICD-10-CM | POA: Diagnosis not present

## 2021-02-05 DIAGNOSIS — E1122 Type 2 diabetes mellitus with diabetic chronic kidney disease: Secondary | ICD-10-CM | POA: Diagnosis not present

## 2021-02-12 ENCOUNTER — Inpatient Hospital Stay (HOSPITAL_COMMUNITY): Admission: RE | Admit: 2021-02-12 | Payer: Medicare HMO | Source: Ambulatory Visit

## 2021-02-13 ENCOUNTER — Other Ambulatory Visit: Payer: Self-pay

## 2021-02-13 DIAGNOSIS — N184 Chronic kidney disease, stage 4 (severe): Secondary | ICD-10-CM

## 2021-02-13 DIAGNOSIS — S82409A Unspecified fracture of shaft of unspecified fibula, initial encounter for closed fracture: Secondary | ICD-10-CM | POA: Diagnosis not present

## 2021-02-13 DIAGNOSIS — J1282 Pneumonia due to coronavirus disease 2019: Secondary | ICD-10-CM | POA: Diagnosis not present

## 2021-02-13 DIAGNOSIS — J9621 Acute and chronic respiratory failure with hypoxia: Secondary | ICD-10-CM | POA: Diagnosis not present

## 2021-02-20 ENCOUNTER — Other Ambulatory Visit (HOSPITAL_COMMUNITY): Payer: Self-pay | Admitting: *Deleted

## 2021-02-21 ENCOUNTER — Other Ambulatory Visit: Payer: Self-pay

## 2021-02-21 ENCOUNTER — Ambulatory Visit (HOSPITAL_COMMUNITY)
Admission: RE | Admit: 2021-02-21 | Discharge: 2021-02-21 | Disposition: A | Discharge status: Home / Self Care | Payer: Medicare HMO | Source: Ambulatory Visit | Attending: Internal Medicine | Admitting: Internal Medicine

## 2021-02-21 VITALS — BP 126/58 | HR 61 | Temp 97.7°F

## 2021-02-21 DIAGNOSIS — N184 Chronic kidney disease, stage 4 (severe): Secondary | ICD-10-CM | POA: Diagnosis not present

## 2021-02-21 DIAGNOSIS — N179 Acute kidney failure, unspecified: Secondary | ICD-10-CM | POA: Insufficient documentation

## 2021-02-21 DIAGNOSIS — N189 Chronic kidney disease, unspecified: Secondary | ICD-10-CM | POA: Diagnosis not present

## 2021-02-21 LAB — RENAL FUNCTION PANEL
Albumin: 3.7 g/dL (ref 3.5–5.0)
Anion gap: 10 (ref 5–15)
BUN: 69 mg/dL — ABNORMAL HIGH (ref 8–23)
CO2: 22 mmol/L (ref 22–32)
Calcium: 8.6 mg/dL — ABNORMAL LOW (ref 8.9–10.3)
Chloride: 108 mmol/L (ref 98–111)
Creatinine, Ser: 2.9 mg/dL — ABNORMAL HIGH (ref 0.44–1.00)
GFR, Estimated: 18 mL/min — ABNORMAL LOW (ref >60)
Glucose, Bld: 84 mg/dL (ref 70–99)
Phosphorus: 4.4 mg/dL (ref 2.5–4.6)
Potassium: 5.1 mmol/L (ref 3.5–5.1)
Sodium: 140 mmol/L (ref 135–145)

## 2021-02-21 LAB — IRON AND TIBC
Iron: 58 ug/dL (ref 28–170)
Saturation Ratios: 21 % (ref 10.4–31.8)
TIBC: 276 ug/dL (ref 250–450)
UIBC: 218 ug/dL

## 2021-02-21 LAB — POCT HEMOGLOBIN-HEMACUE: Hemoglobin: 11.1 g/dL — ABNORMAL LOW (ref 12.0–15.0)

## 2021-02-21 MED ORDER — EPOETIN ALFA-EPBX 10000 UNIT/ML IJ SOLN
20000.0000 [IU] | INTRAMUSCULAR | Status: DC
  Administered 2021-02-21: 20000 [IU] via SUBCUTANEOUS

## 2021-02-21 MED ORDER — EPOETIN ALFA-EPBX 10000 UNIT/ML IJ SOLN
INTRAMUSCULAR | Status: AC
  Filled 2021-02-21: qty 2

## 2021-02-22 LAB — PTH, INTACT AND CALCIUM
Calcium, Total (PTH): 8.7 mg/dL (ref 8.7–10.3)
PTH: 148 pg/mL — ABNORMAL HIGH (ref 15–65)

## 2021-02-26 ENCOUNTER — Encounter (HOSPITAL_COMMUNITY): Payer: Medicare HMO

## 2021-02-27 ENCOUNTER — Other Ambulatory Visit: Payer: Self-pay

## 2021-02-27 ENCOUNTER — Ambulatory Visit: Payer: Medicare HMO | Admitting: Cardiovascular Disease

## 2021-02-27 ENCOUNTER — Encounter: Payer: Self-pay | Admitting: Cardiovascular Disease

## 2021-02-27 DIAGNOSIS — E782 Mixed hyperlipidemia: Secondary | ICD-10-CM

## 2021-02-27 DIAGNOSIS — I5032 Chronic diastolic (congestive) heart failure: Secondary | ICD-10-CM | POA: Diagnosis not present

## 2021-02-27 DIAGNOSIS — I493 Ventricular premature depolarization: Secondary | ICD-10-CM

## 2021-02-27 DIAGNOSIS — I10 Essential (primary) hypertension: Secondary | ICD-10-CM

## 2021-02-27 NOTE — Progress Notes (Signed)
02/27/2021 Latasha [name]   [birth year]  [id number]  Primary Physician Shirline Frees, MD Primary Cardiologist: Lorretta Harp MD Lupe Carney, [place]  HPI:  Latasha [name] is a 65 y.o.  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  She was referred by the [facility] emergency room (Dr. Autumn Messing evaluation of palpitations.  I last saw her in the office 04/12/2020.  She has a history of hypertension, diabetes and hyperlipidemia. Her father did die at age 58 of a myocardial infarction. She has never had a heart attack or stroke. She denies chest pain but has had some dyspnea on exertion which she attributes increasing weight related to dietary indiscretion and inactivity. She does suffer from chronic vertigo. She stopped ingesting caffeine approximately 3 weeks ago. She does admit to having a lot of stress in her life both at home and at work. On 05/26/16 she was seen in the emergency room with palpitations. Her workup was negative. Interestingly, 2 years ago she was worked up by Dr. Worthy Rancher who did a 2-D echo that was normal and a Holter monitor that showed PVCs. Her palpitations resolved after 4 days.  I performed diagnostic coronary angiography on her 03/09/2018 after admission for chest pain and abnormal Myoview.  Her cath was clean suggesting false positive stress test.  She was admitted to [facility] [id number] with diastolic heart failure.  She was diuresed.  Her serum creatinine was elevated and she was discharged home the following day.  A 2D echo performed at the time of hospitalization revealed normal EF.  Since being home she feels clinically improved.  She denies chest pain or shortness of breath.  She does avoid salt.  She was exploring the possibility of bariatric surgery for weight reduction.  Since I saw her a year ago she was hospitalized at  [facility] in January for COVID-pneumonia for 11 days.  She did have stage IV CKD and was treated with IV remdesivir.  Her serum creatinine went up to 7 but is back down to the high 2 range.  She is getting vein for potential AV fistula.  She also had a GI bleed after that hospitalization.  She does complain of dyspnea on exertion but denies chest pain.  She also has symptoms compatible with obstructive sleep apnea is being currently evaluated for this.  Current Meds  Medication Sig  . acetaminophen (TYLENOL) 500 MG tablet Take 500 mg by mouth every 6 (six) hours as needed for mild pain, fever or headache.  . albuterol (PROVENTIL HFA;VENTOLIN HFA) 108 (90 Base) MCG/ACT inhaler Inhale 2 puffs into the lungs every 6 (six) hours as needed for wheezing or shortness of breath.  Marland Kitchen amLODipine (NORVASC) 10 MG tablet Take 1 tablet (10 mg total) by mouth daily.  Marland Kitchen atorvastatin (LIPITOR) 40 MG tablet Take 40 mg by mouth daily.   . carvedilol (COREG) 25 MG tablet Take 1 tablet (25 mg total) by mouth 2 (two) times daily with a meal.  . furosemide (LASIX) 80 MG tablet TAKE 1 TABLET (80 MG TOTAL) BY MOUTH DAILY.  Marland Kitchen insulin aspart protamine- aspart (NOVOLOG MIX 70/30) (70-30) 100 UNIT/ML injection Inject 50-60 Units into the skin See admin instructions. Use 60 units every morning and use 50 units every evening  . nitroGLYCERIN (NITROSTAT) 0.4 MG SL tablet Place 1 tablet (0.4 mg total) under the tongue every  5 (five) minutes as needed for chest pain.  . pantoprazole (PROTONIX) 40 MG tablet Take 1 tablet (40 mg total) by mouth 2 (two) times daily for 30 days, THEN 1 tablet (40 mg total) daily.     Allergies  Allergen Reactions  . Hydrocodone-Acetaminophen Other (See Comments)    Unknown reaction  . Oxycodone Hcl Other (See Comments)    Unknown reaction    Social History   Socioeconomic History  . Marital status: Married    Spouse name: Not on file  . Number of children: Not on file  . Years of  education: Not on file  . Highest education level: Not on file  Occupational History  . Occupation: retired  Tobacco Use  . Smoking status: Never Smoker  . Smokeless tobacco: Never Used  Substance and Sexual Activity  . Alcohol use: Yes    Comment:  rare  . Drug use: No  . Sexual activity: Not on file  Other Topics Concern  . Not on file  Social History Narrative  . Not on file   Social Determinants of Health   Financial Resource Strain: Not on file  Food Insecurity: No Food Insecurity  . Worried About Charity fundraiser in the Last Year: Never true  . Ran Out of Food in the Last Year: Never true  Transportation Needs: No Transportation Needs  . Lack of Transportation (Medical): No  . Lack of Transportation (Non-Medical): No  Physical Activity: Not on file  Stress: Not on file  Social Connections: Not on file  Intimate Partner Violence: Not on file     Review of Systems: General: negative for chills, fever, night sweats or weight changes.  Cardiovascular: negative for chest pain, dyspnea on exertion, edema, orthopnea, palpitations, paroxysmal nocturnal dyspnea or shortness of breath Dermatological: negative for rash Respiratory: negative for cough or wheezing Urologic: negative for hematuria Abdominal: negative for nausea, vomiting, diarrhea, bright red blood per rectum, melena, or hematemesis Neurologic: negative for visual changes, syncope, or dizziness All other systems reviewed and are otherwise negative except as noted above.    Blood pressure 132/72, pulse 69, height '5\' 4"'$  (1.626 m), weight 264 lb (119.7 kg).  General appearance: alert and no distress Neck: no adenopathy, no carotid bruit, no JVD, supple, symmetrical, trachea midline and thyroid not enlarged, symmetric, no tenderness/mass/nodules Lungs: clear to auscultation bilaterally Heart: regular rate and rhythm, S1, S2 normal, no murmur, click, rub or gallop Extremities: extremities normal, atraumatic, no  cyanosis or edema Pulses: 2+ and symmetric Skin: Skin color, texture, turgor normal. No rashes or lesions Neurologic: Alert and oriented X 3, normal strength and tone. Normal symmetric reflexes. Normal coordination and gait  EKG not performed today  ASSESSMENT AND PLAN:   Essential hypertension History of essential hypertension blood pressure measured today 132/72.  She is on amlodipine and carvedilol.  Chronic diastolic CHF (congestive heart failure) (HCC) History of chronic diastolic heart failure 2D echo performed 03/06/2020 revealing normal LV systolic function with grade 1 diastolic dysfunction.  She is on furosemide 80 mg a day and has stage IV CKD.  Hyperlipemia History of hyperlipidemia on statin therapy with lipid profile performed 08/20/2019 revealing total cholesterol 115, LDL of 49 and HDL 36.  PVC's (premature ventricular contractions) History of PVCs in the past demonstrated on event monitoring with occasional palpitations.      Lorretta Harp MD FACP,FACC,FAHA, [facility] 02/27/2021 4:15 PM

## 2021-02-27 NOTE — Assessment & Plan Note (Signed)
History of hyperlipidemia on statin therapy with lipid profile performed 08/20/2019 revealing total cholesterol 115, LDL of 49 and HDL 36.

## 2021-02-27 NOTE — Assessment & Plan Note (Signed)
History of chronic diastolic heart failure 2D echo performed 03/06/2020 revealing normal LV systolic function with grade 1 diastolic dysfunction.  She is on furosemide 80 mg a day and has stage IV CKD.

## 2021-02-27 NOTE — Patient Instructions (Signed)

## 2021-02-27 NOTE — Assessment & Plan Note (Signed)
History of essential hypertension blood pressure measured today 132/72.  She is on amlodipine and carvedilol.

## 2021-02-27 NOTE — Assessment & Plan Note (Signed)
History of PVCs in the past demonstrated on event monitoring with occasional palpitations.

## 2021-02-27 NOTE — Patient Outreach (Signed)
[facility]) Care Management  02/27/2021  DELAPHINE DALMAU Aug 18, 1956 [id number]   Telephone call to patient for disease management follow up.   No answer.  HIPAA compliant voice message left.    Plan: If no return call, RN CM will attempt patient again in June.  Jone Baseman, RN, MSN [place] Management Care Management Coordinator Direct Line [phone] Cell [phone] Toll Free: [phone]  Fax: [phone]

## 2021-03-07 ENCOUNTER — Other Ambulatory Visit: Payer: Self-pay

## 2021-03-07 ENCOUNTER — Encounter (HOSPITAL_COMMUNITY)
Admission: RE | Admit: 2021-03-07 | Discharge: 2021-03-07 | Disposition: A | Discharge status: Home / Self Care | Payer: Medicare HMO | Source: Ambulatory Visit | Attending: Internal Medicine | Admitting: Internal Medicine

## 2021-03-07 VITALS — BP 171/67 | HR 68 | Temp 97.8°F | Resp 18

## 2021-03-07 DIAGNOSIS — N184 Chronic kidney disease, stage 4 (severe): Secondary | ICD-10-CM | POA: Diagnosis not present

## 2021-03-07 DIAGNOSIS — N189 Chronic kidney disease, unspecified: Secondary | ICD-10-CM | POA: Diagnosis not present

## 2021-03-07 DIAGNOSIS — N179 Acute kidney failure, unspecified: Secondary | ICD-10-CM

## 2021-03-07 LAB — POCT HEMOGLOBIN-HEMACUE: Hemoglobin: 11.5 g/dL — ABNORMAL LOW (ref 12.0–15.0)

## 2021-03-07 MED ORDER — EPOETIN ALFA-EPBX 10000 UNIT/ML IJ SOLN
20000.0000 [IU] | INTRAMUSCULAR | Status: DC
  Administered 2021-03-07: 20000 [IU] via SUBCUTANEOUS

## 2021-03-07 MED ORDER — EPOETIN ALFA-EPBX 10000 UNIT/ML IJ SOLN
INTRAMUSCULAR | Status: AC
  Filled 2021-03-07: qty 2

## 2021-03-09 ENCOUNTER — Ambulatory Visit (HOSPITAL_COMMUNITY)
Admission: RE | Admit: 2021-03-09 | Discharge: 2021-03-09 | Disposition: A | Discharge status: Home / Self Care | Payer: Medicare HMO | Source: Ambulatory Visit | Attending: Vascular Surgery | Admitting: Vascular Surgery

## 2021-03-09 ENCOUNTER — Encounter: Payer: Self-pay | Admitting: Vascular Surgery

## 2021-03-09 ENCOUNTER — Other Ambulatory Visit: Payer: Self-pay

## 2021-03-09 ENCOUNTER — Ambulatory Visit (INDEPENDENT_AMBULATORY_CARE_PROVIDER_SITE_OTHER): Payer: Medicare HMO | Admitting: Vascular Surgery

## 2021-03-09 ENCOUNTER — Ambulatory Visit (INDEPENDENT_AMBULATORY_CARE_PROVIDER_SITE_OTHER)
Admission: RE | Admit: 2021-03-09 | Discharge: 2021-03-09 | Disposition: A | Discharge status: Home / Self Care | Payer: Medicare HMO | Source: Ambulatory Visit | Attending: Vascular Surgery | Admitting: Vascular Surgery

## 2021-03-09 VITALS — BP 163/74 | HR 63 | Temp 97.7°F | Resp 20 | Ht 64.0 in | Wt 264.0 lb

## 2021-03-09 DIAGNOSIS — N184 Chronic kidney disease, stage 4 (severe): Secondary | ICD-10-CM | POA: Insufficient documentation

## 2021-03-09 NOTE — Progress Notes (Signed)
Patient ID: Latasha [name], female   DOB: 1956-08-01, 65 y.o.   MRN: [id number]  Reason for Consult: New Patient (Initial Visit)   Referred by Shirline Frees, MD  Subjective:     HPI:  Latasha [name] is a 65 y.o. female without previous history of dialysis.  She does have chronic kidney disease secondary to diabetes, hyperlipidemia, hypertension.  She is left-hand dominant.  She has never had right arm chest, breast or upper extremity surgery.  She does not take blood thinners.  Past Medical History:  Diagnosis Date  . BPPV (benign paroxysmal positional vertigo)   . Chronic congestive heart failure with left ventricular diastolic dysfunction ([place])   . CKD (chronic kidney disease), stage IV ([place]) 05/15/2017  . Diabetes mellitus   . Hyperlipidemia   . Hypertension   . Left atrial enlargement   . Morbid obesity with BMI of 40.0-44.9, adult ([place])   . Palpitations    Family History  Problem Relation Age of Onset  . Heart disease Father   . Hypertension Father   . Alzheimer's disease Mother   . Diabetes Mother   . Diabetes Sister   . Dementia Other    Past Surgical History:  Procedure Laterality Date  . ABDOMINAL HYSTERECTOMY    . BIOPSY  11/09/2020   Procedure: BIOPSY;  Surgeon: Wilford Corner, MD;  Location: [facility] ENDOSCOPY;  Service: Endoscopy;;  . ESOPHAGOGASTRODUODENOSCOPY N/A 11/09/2020   Procedure: ESOPHAGOGASTRODUODENOSCOPY (EGD);  Surgeon: Wilford Corner, MD;  Location: [facility];  Service: Endoscopy;  Laterality: N/A;  . EYE SURGERY     cataract/left eye, right eye had bleeding in back  . IR FLUORO GUIDE CV LINE RIGHT  10/26/2020  . IR US GUIDE VASC ACCESS RIGHT  10/26/2020  . KNEE SURGERY    . LEFT HEART CATH AND CORONARY ANGIOGRAPHY N/A 03/09/2018   Procedure: LEFT HEART CATH AND CORONARY ANGIOGRAPHY;  Surgeon: Lorretta Harp, MD;  Location: [facility];  Service: Cardiovascular;  Laterality: N/A;  . TRIGGER FINGER RELEASE      Short Social History:   Social History   Tobacco Use  . Smoking status: Never Smoker  . Smokeless tobacco: Never Used  Substance Use Topics  . Alcohol use: Yes    Comment:  rare    Allergies  Allergen Reactions  . Hydrocodone-Acetaminophen Other (See Comments)    Unknown reaction  . Oxycodone Hcl Other (See Comments)    Unknown reaction    Current Outpatient Medications  Medication Sig Dispense Refill  . acetaminophen (TYLENOL) 500 MG tablet Take 500 mg by mouth every 6 (six) hours as needed for mild pain, fever or headache.    . albuterol (PROVENTIL HFA;VENTOLIN HFA) 108 (90 Base) MCG/ACT inhaler Inhale 2 puffs into the lungs every 6 (six) hours as needed for wheezing or shortness of breath.    Marland Kitchen amLODipine (NORVASC) 10 MG tablet Take 1 tablet (10 mg total) by mouth daily. 30 tablet 0  . atorvastatin (LIPITOR) 40 MG tablet Take 40 mg by mouth daily.     . carvedilol (COREG) 25 MG tablet Take 1 tablet (25 mg total) by mouth 2 (two) times daily with a meal. 60 tablet 0  . furosemide (LASIX) 80 MG tablet TAKE 1 TABLET (80 MG TOTAL) BY MOUTH DAILY. 30 tablet 0  . insulin aspart protamine- aspart (NOVOLOG MIX 70/30) (70-30) 100 UNIT/ML injection Inject 50-60 Units into the skin See admin instructions. Use 60 units every morning and use 50 units  every evening    . nitroGLYCERIN (NITROSTAT) 0.4 MG SL tablet Place 1 tablet (0.4 mg total) under the tongue every 5 (five) minutes as needed for chest pain. 30 tablet 0  . pantoprazole (PROTONIX) 40 MG tablet Take 1 tablet (40 mg total) by mouth 2 (two) times daily for 30 days, THEN 1 tablet (40 mg total) daily. 150 tablet 0   No current facility-administered medications for this visit.    Review of Systems  Constitutional:  Constitutional negative. HENT: HENT negative.  Eyes: Eyes negative.  Respiratory: Positive for shortness of breath.  Cardiovascular: Positive for leg swelling.  GI: Gastrointestinal negative.  Musculoskeletal: Musculoskeletal negative.   Neurological: Neurological negative. Hematologic: Hematologic/lymphatic negative.  Psychiatric: Psychiatric negative.        Objective:  Objective   Vitals:   03/09/21 1151  BP: (!) 163/74  Pulse: 63  Resp: 20  Temp: 97.7 F (36.5 C)  SpO2: 94%    Physical Exam HENT:     Nose:     Comments: Wearing a mask Eyes:     Pupils: Pupils are equal, round, and reactive to light.  Cardiovascular:     Pulses: Normal pulses.  Pulmonary:     Effort: Pulmonary effort is normal.  Abdominal:     General: Abdomen is flat.     Palpations: Abdomen is soft.  Musculoskeletal:        General: Normal range of motion.     Cervical back: Normal range of motion and neck supple.  Skin:    General: Skin is warm.     Capillary Refill: Capillary refill takes less than 2 seconds.  Neurological:     General: No focal deficit present.     Mental Status: She is alert.  Psychiatric:        Mood and Affect: Mood normal.        Behavior: Behavior normal.        Thought Content: Thought content normal.        Judgment: Judgment normal.     Data: Right Pre-Dialysis Findings:  +-----------------------+----------+--------------------+---------+--------  +  Location        PSV (cm/s)Intralum. Diam. (cm)Waveform  Comments  +-----------------------+----------+--------------------+---------+--------  +  Brachial Antecub. [id number]    0.45        triphasic        +-----------------------+----------+--------------------+---------+--------  +  Radial Art at Wrist  70    0.25        triphasic        +-----------------------+----------+--------------------+---------+--------  +  Ulnar Art at Wrist   63    0.23        triphasic        +-----------------------+----------+--------------------+---------+--------  +    Left Pre-Dialysis Findings:   +-----------------------+----------+--------------------+---------+--------  +  Location        PSV (cm/s)Intralum. Diam. (cm)Waveform  Comments  +-----------------------+----------+--------------------+---------+--------  +  Brachial Antecub. [id number]    0.46        triphasic        +-----------------------+----------+--------------------+---------+--------  +  Radial Art at Wrist  72    0.24        triphasic        +-----------------------+----------+--------------------+---------+--------  +  Ulnar Art at Wrist   0     79.00        triphasic        +-----------------------+----------+--------------------+---------+--------  +   +-----------------+-------------+----------+--------+  Right Cephalic  Diameter (cm)Depth (cm)Findings  +-----------------+-------------+----------+--------+  Shoulder       0.45              +-----------------+-------------+----------+--------+  Prox upper arm    0.35              +-----------------+-------------+----------+--------+  Mid upper arm    0.37              +-----------------+-------------+----------+--------+  Dist upper arm    0.36              +-----------------+-------------+----------+--------+  Antecubital fossa  0.41              +-----------------+-------------+----------+--------+  Prox forearm     0.34              +-----------------+-------------+----------+--------+  Mid forearm     0.33              +-----------------+-------------+----------+--------+  Dist forearm     0.25              +-----------------+-------------+----------+--------+   +-----------------+-------------+----------+--------+  Right Basilic  Diameter (cm)Depth (cm)Findings   +-----------------+-------------+----------+--------+  Prox upper arm    0.38              +-----------------+-------------+----------+--------+  Mid upper arm    0.33              +-----------------+-------------+----------+--------+  Dist upper arm    0.41              +-----------------+-------------+----------+--------+  Antecubital fossa  0.26              +-----------------+-------------+----------+--------+   +-----------------+-------------+----------+-----------------------+  Left Cephalic  Diameter (cm)Depth (cm)    Findings      +-----------------+-------------+----------+-----------------------+  Shoulder       0.41                      +-----------------+-------------+----------+-----------------------+  Prox upper arm    0.39                      +-----------------+-------------+----------+-----------------------+  Mid upper arm    0.28                      +-----------------+-------------+----------+-----------------------+  Dist upper arm    0.28                      +-----------------+-------------+----------+-----------------------+  Antecubital fossa  0.23            lateral      +-----------------+-------------+----------+-----------------------+  Prox forearm               multiple small branches  +-----------------+-------------+----------+-----------------------+   +-----------------+-------------+----------+--------+  Left Basilic   Diameter (cm)Depth (cm)Findings  +-----------------+-------------+----------+--------+  Prox upper arm    0.44              +-----------------+-------------+----------+--------+  Mid upper arm    0.40               +-----------------+-------------+----------+--------+  Dist upper arm    0.48              +-----------------+-------------+----------+--------+  Antecubital fossa  0.31              +-----------------+-------------+----------+--------+  Prox forearm     0.29              +-----------------+-------------+----------+--------+  Mid forearm     0.30              +-----------------+-------------+----------+--------+  Distal forearm    0.28              +-----------------+-------------+----------+--------+  Summary: Right: Patent cephalic and basilic veins.  Left: Patent cephalic and basilic veins.      Assessment/Plan:     65 year old female with chronic kidney disease.  She appears to have suitable cephalic and basilic vein in her nondominant right upper extremity.  We will plan for fistula in the right upper extremity in the near future.  I discussed the risk benefits alternatives she demonstrates good understanding.     Waynetta Sandy MD [facility]

## 2021-03-16 DIAGNOSIS — J1282 Pneumonia due to coronavirus disease 2019: Secondary | ICD-10-CM | POA: Diagnosis not present

## 2021-03-16 DIAGNOSIS — K5900 Constipation, unspecified: Secondary | ICD-10-CM | POA: Diagnosis not present

## 2021-03-16 DIAGNOSIS — D500 Iron deficiency anemia secondary to blood loss (chronic): Secondary | ICD-10-CM | POA: Diagnosis not present

## 2021-03-16 DIAGNOSIS — K279 Peptic ulcer, site unspecified, unspecified as acute or chronic, without hemorrhage or perforation: Secondary | ICD-10-CM | POA: Diagnosis not present

## 2021-03-16 DIAGNOSIS — J9621 Acute and chronic respiratory failure with hypoxia: Secondary | ICD-10-CM | POA: Diagnosis not present

## 2021-03-16 DIAGNOSIS — S82409A Unspecified fracture of shaft of unspecified fibula, initial encounter for closed fracture: Secondary | ICD-10-CM | POA: Diagnosis not present

## 2021-03-21 ENCOUNTER — Inpatient Hospital Stay (HOSPITAL_COMMUNITY): Admission: RE | Admit: 2021-03-21 | Payer: Medicare HMO | Source: Ambulatory Visit

## 2021-03-21 ENCOUNTER — Telehealth: Payer: Self-pay

## 2021-03-21 NOTE — Telephone Encounter (Signed)
Pt called to request to cancel Right arm AVF surgery scheduled for 03/27/21 with Dr. Donzetta Matters per nephrologist recommendations. Stated her doctor wants her to take a class to decide which type of dialysis would work best for her and then she will contact our office back with this information. Dr. Donzetta Matters updated.

## 2021-03-26 ENCOUNTER — Other Ambulatory Visit: Payer: Self-pay

## 2021-03-26 ENCOUNTER — Ambulatory Visit (HOSPITAL_COMMUNITY)
Admission: RE | Admit: 2021-03-26 | Discharge: 2021-03-26 | Disposition: A | Discharge status: Home / Self Care | Payer: Medicare HMO | Source: Ambulatory Visit | Attending: Internal Medicine | Admitting: Internal Medicine

## 2021-03-26 VITALS — BP 157/65 | HR 62 | Resp 20

## 2021-03-26 DIAGNOSIS — N184 Chronic kidney disease, stage 4 (severe): Secondary | ICD-10-CM | POA: Insufficient documentation

## 2021-03-26 DIAGNOSIS — N179 Acute kidney failure, unspecified: Secondary | ICD-10-CM | POA: Diagnosis not present

## 2021-03-26 DIAGNOSIS — N189 Chronic kidney disease, unspecified: Secondary | ICD-10-CM | POA: Diagnosis not present

## 2021-03-26 LAB — IRON AND TIBC
Iron: 62 ug/dL (ref 28–170)
Saturation Ratios: 20 % (ref 10.4–31.8)
TIBC: 305 ug/dL (ref 250–450)
UIBC: 243 ug/dL

## 2021-03-26 LAB — POCT HEMOGLOBIN-HEMACUE: Hemoglobin: 11.4 g/dL — ABNORMAL LOW (ref 12.0–15.0)

## 2021-03-26 LAB — FERRITIN: Ferritin: 177 ng/mL (ref 11–307)

## 2021-03-26 MED ORDER — EPOETIN ALFA-EPBX 40000 UNIT/ML IJ SOLN: 30000.0000 [IU] | INTRAMUSCULAR | Status: DC

## 2021-03-26 MED ORDER — EPOETIN ALFA-EPBX 40000 UNIT/ML IJ SOLN
INTRAMUSCULAR | Status: AC
  Administered 2021-03-26: 30000 [IU] via SUBCUTANEOUS
  Filled 2021-03-26: qty 1

## 2021-03-27 ENCOUNTER — Ambulatory Visit: Admit: 2021-03-27 | Payer: Medicare HMO | Admitting: Vascular Surgery

## 2021-03-27 SURGERY — ARTERIOVENOUS (AV) FISTULA CREATION
Anesthesia: Choice | Laterality: Right

## 2021-03-29 ENCOUNTER — Other Ambulatory Visit: Payer: Self-pay

## 2021-03-29 NOTE — Patient Outreach (Signed)
[facility]) Care Management  03/29/2021  Latasha [name] [birth year] [id number]   Telephone call to patient for disease management follow up.   No answer.  Unable to leave a message.     Plan: If no return call, RN CM will attempt patient again in 4-6 weeks.  Jone Baseman, RN, MSN [place] [facility] Care Management Coordinator Direct Line [phone] Cell [phone] Toll Free: [phone]  Fax: [phone]

## 2021-04-15 DIAGNOSIS — J1282 Pneumonia due to coronavirus disease 2019: Secondary | ICD-10-CM | POA: Diagnosis not present

## 2021-04-15 DIAGNOSIS — S82409A Unspecified fracture of shaft of unspecified fibula, initial encounter for closed fracture: Secondary | ICD-10-CM | POA: Diagnosis not present

## 2021-04-15 DIAGNOSIS — J9621 Acute and chronic respiratory failure with hypoxia: Secondary | ICD-10-CM | POA: Diagnosis not present

## 2021-04-18 ENCOUNTER — Other Ambulatory Visit: Payer: Self-pay

## 2021-04-18 ENCOUNTER — Ambulatory Visit (HOSPITAL_COMMUNITY)
Admission: RE | Admit: 2021-04-18 | Discharge: 2021-04-18 | Disposition: A | Discharge status: Home / Self Care | Payer: Medicare HMO | Source: Ambulatory Visit | Attending: Internal Medicine | Admitting: Internal Medicine

## 2021-04-18 VITALS — BP 139/66 | HR 62 | Temp 97.8°F | Resp 18

## 2021-04-18 DIAGNOSIS — N184 Chronic kidney disease, stage 4 (severe): Secondary | ICD-10-CM | POA: Insufficient documentation

## 2021-04-18 DIAGNOSIS — N179 Acute kidney failure, unspecified: Secondary | ICD-10-CM | POA: Insufficient documentation

## 2021-04-18 DIAGNOSIS — N189 Chronic kidney disease, unspecified: Secondary | ICD-10-CM | POA: Diagnosis not present

## 2021-04-18 LAB — IRON AND TIBC
Iron: 62 ug/dL (ref 28–170)
Saturation Ratios: 23 % (ref 10.4–31.8)
TIBC: 270 ug/dL (ref 250–450)
UIBC: 208 ug/dL

## 2021-04-18 LAB — FERRITIN: Ferritin: 172 ng/mL (ref 11–307)

## 2021-04-18 MED ORDER — EPOETIN ALFA-EPBX 10000 UNIT/ML IJ SOLN
15000.0000 [IU] | INTRAMUSCULAR | Status: DC
  Administered 2021-04-18: 15000 [IU] via SUBCUTANEOUS

## 2021-04-18 MED ORDER — EPOETIN ALFA-EPBX 10000 UNIT/ML IJ SOLN
INTRAMUSCULAR | Status: AC
  Filled 2021-04-18: qty 2

## 2021-04-19 LAB — POCT HEMOGLOBIN-HEMACUE: Hemoglobin: 10.3 g/dL — ABNORMAL LOW (ref 12.0–15.0)

## 2021-04-25 ENCOUNTER — Other Ambulatory Visit: Payer: Self-pay

## 2021-04-25 NOTE — Patient Outreach (Signed)
[facility]) Care Management  04/25/2021  Latasha [name] [birth year] [name]   Telephone call to patient for disease management follow up.   No answer.  HIPAA compliant voice message left.    Plan: If no return call, RN CM will attempt patient again October.  Jone Baseman, RN, MSN [place] [facility] Care Management Coordinator Direct Line [phone] Cell [phone] Toll Free: [phone]  Fax: [phone]

## 2021-04-30 DIAGNOSIS — R252 Cramp and spasm: Secondary | ICD-10-CM | POA: Diagnosis not present

## 2021-04-30 DIAGNOSIS — E1122 Type 2 diabetes mellitus with diabetic chronic kidney disease: Secondary | ICD-10-CM | POA: Diagnosis not present

## 2021-04-30 DIAGNOSIS — I5030 Unspecified diastolic (congestive) heart failure: Secondary | ICD-10-CM | POA: Diagnosis not present

## 2021-04-30 DIAGNOSIS — D649 Anemia, unspecified: Secondary | ICD-10-CM | POA: Diagnosis not present

## 2021-04-30 DIAGNOSIS — N185 Chronic kidney disease, stage 5: Secondary | ICD-10-CM | POA: Diagnosis not present

## 2021-04-30 DIAGNOSIS — Z6841 Body Mass Index (BMI) 40.0 and over, adult: Secondary | ICD-10-CM | POA: Diagnosis not present

## 2021-04-30 DIAGNOSIS — I120 Hypertensive chronic kidney disease with stage 5 chronic kidney disease or end stage renal disease: Secondary | ICD-10-CM | POA: Diagnosis not present

## 2021-04-30 DIAGNOSIS — F419 Anxiety disorder, unspecified: Secondary | ICD-10-CM | POA: Diagnosis not present

## 2021-05-16 ENCOUNTER — Ambulatory Visit (HOSPITAL_COMMUNITY)
Admission: RE | Admit: 2021-05-16 | Discharge: 2021-05-16 | Disposition: A | Discharge status: Home / Self Care | Payer: Medicare HMO | Source: Ambulatory Visit | Attending: Internal Medicine | Admitting: Internal Medicine

## 2021-05-16 ENCOUNTER — Other Ambulatory Visit: Payer: Self-pay

## 2021-05-16 VITALS — BP 160/65 | HR 67 | Temp 97.7°F | Resp 18

## 2021-05-16 DIAGNOSIS — J1282 Pneumonia due to coronavirus disease 2019: Secondary | ICD-10-CM | POA: Diagnosis not present

## 2021-05-16 DIAGNOSIS — N189 Chronic kidney disease, unspecified: Secondary | ICD-10-CM | POA: Diagnosis not present

## 2021-05-16 DIAGNOSIS — N184 Chronic kidney disease, stage 4 (severe): Secondary | ICD-10-CM | POA: Diagnosis not present

## 2021-05-16 DIAGNOSIS — J9621 Acute and chronic respiratory failure with hypoxia: Secondary | ICD-10-CM | POA: Diagnosis not present

## 2021-05-16 DIAGNOSIS — S82409A Unspecified fracture of shaft of unspecified fibula, initial encounter for closed fracture: Secondary | ICD-10-CM | POA: Diagnosis not present

## 2021-05-16 DIAGNOSIS — N179 Acute kidney failure, unspecified: Secondary | ICD-10-CM | POA: Diagnosis not present

## 2021-05-16 LAB — IRON AND TIBC
Iron: 66 ug/dL (ref 28–170)
Saturation Ratios: 22 % (ref 10.4–31.8)
TIBC: 298 ug/dL (ref 250–450)
UIBC: 232 ug/dL

## 2021-05-16 LAB — POCT HEMOGLOBIN-HEMACUE: Hemoglobin: 10.9 g/dL — ABNORMAL LOW (ref 12.0–15.0)

## 2021-05-16 LAB — FERRITIN: Ferritin: 227 ng/mL (ref 11–307)

## 2021-05-16 MED ORDER — EPOETIN ALFA-EPBX 10000 UNIT/ML IJ SOLN
15000.0000 [IU] | INTRAMUSCULAR | Status: DC
  Administered 2021-05-16: 15000 [IU] via SUBCUTANEOUS

## 2021-05-16 MED ORDER — EPOETIN ALFA-EPBX 10000 UNIT/ML IJ SOLN
INTRAMUSCULAR | Status: AC
  Filled 2021-05-16: qty 2

## 2021-05-29 ENCOUNTER — Other Ambulatory Visit (HOSPITAL_COMMUNITY): Payer: Self-pay

## 2021-05-30 DIAGNOSIS — N185 Chronic kidney disease, stage 5: Secondary | ICD-10-CM | POA: Diagnosis not present

## 2021-05-30 DIAGNOSIS — I5030 Unspecified diastolic (congestive) heart failure: Secondary | ICD-10-CM | POA: Diagnosis not present

## 2021-05-30 DIAGNOSIS — G629 Polyneuropathy, unspecified: Secondary | ICD-10-CM | POA: Diagnosis not present

## 2021-05-30 DIAGNOSIS — I120 Hypertensive chronic kidney disease with stage 5 chronic kidney disease or end stage renal disease: Secondary | ICD-10-CM | POA: Diagnosis not present

## 2021-05-30 DIAGNOSIS — Z6841 Body Mass Index (BMI) 40.0 and over, adult: Secondary | ICD-10-CM | POA: Diagnosis not present

## 2021-05-30 DIAGNOSIS — E1122 Type 2 diabetes mellitus with diabetic chronic kidney disease: Secondary | ICD-10-CM | POA: Diagnosis not present

## 2021-05-30 DIAGNOSIS — D649 Anemia, unspecified: Secondary | ICD-10-CM | POA: Diagnosis not present

## 2021-05-31 DIAGNOSIS — E785 Hyperlipidemia, unspecified: Secondary | ICD-10-CM | POA: Diagnosis not present

## 2021-05-31 DIAGNOSIS — E11319 Type 2 diabetes mellitus with unspecified diabetic retinopathy without macular edema: Secondary | ICD-10-CM | POA: Diagnosis not present

## 2021-05-31 DIAGNOSIS — E1165 Type 2 diabetes mellitus with hyperglycemia: Secondary | ICD-10-CM | POA: Diagnosis not present

## 2021-05-31 DIAGNOSIS — E1149 Type 2 diabetes mellitus with other diabetic neurological complication: Secondary | ICD-10-CM | POA: Diagnosis not present

## 2021-05-31 DIAGNOSIS — I129 Hypertensive chronic kidney disease with stage 1 through stage 4 chronic kidney disease, or unspecified chronic kidney disease: Secondary | ICD-10-CM | POA: Diagnosis not present

## 2021-05-31 DIAGNOSIS — E1122 Type 2 diabetes mellitus with diabetic chronic kidney disease: Secondary | ICD-10-CM | POA: Diagnosis not present

## 2021-05-31 DIAGNOSIS — E1169 Type 2 diabetes mellitus with other specified complication: Secondary | ICD-10-CM | POA: Diagnosis not present

## 2021-05-31 DIAGNOSIS — N184 Chronic kidney disease, stage 4 (severe): Secondary | ICD-10-CM | POA: Diagnosis not present

## 2021-05-31 DIAGNOSIS — Z794 Long term (current) use of insulin: Secondary | ICD-10-CM | POA: Diagnosis not present

## 2021-06-13 ENCOUNTER — Other Ambulatory Visit: Payer: Self-pay

## 2021-06-13 ENCOUNTER — Ambulatory Visit (HOSPITAL_COMMUNITY)
Admission: RE | Admit: 2021-06-13 | Discharge: 2021-06-13 | Disposition: A | Discharge status: Home / Self Care | Payer: Medicare HMO | Source: Ambulatory Visit | Attending: Internal Medicine | Admitting: Internal Medicine

## 2021-06-13 VITALS — BP 133/52 | HR 66 | Resp 18

## 2021-06-13 DIAGNOSIS — N189 Chronic kidney disease, unspecified: Secondary | ICD-10-CM | POA: Diagnosis not present

## 2021-06-13 DIAGNOSIS — N179 Acute kidney failure, unspecified: Secondary | ICD-10-CM | POA: Insufficient documentation

## 2021-06-13 DIAGNOSIS — N184 Chronic kidney disease, stage 4 (severe): Secondary | ICD-10-CM

## 2021-06-13 LAB — RENAL FUNCTION PANEL
Albumin: 3.8 g/dL (ref 3.5–5.0)
Anion gap: 12 (ref 5–15)
BUN: 104 mg/dL — ABNORMAL HIGH (ref 8–23)
CO2: 20 mmol/L — ABNORMAL LOW (ref 22–32)
Calcium: 8.8 mg/dL — ABNORMAL LOW (ref 8.9–10.3)
Chloride: 105 mmol/L (ref 98–111)
Creatinine, Ser: 3.5 mg/dL — ABNORMAL HIGH (ref 0.44–1.00)
GFR, Estimated: 14 mL/min — ABNORMAL LOW (ref >60)
Glucose, Bld: 181 mg/dL — ABNORMAL HIGH (ref 70–99)
Phosphorus: 5.8 mg/dL — ABNORMAL HIGH (ref 2.5–4.6)
Potassium: 5.2 mmol/L — ABNORMAL HIGH (ref 3.5–5.1)
Sodium: 137 mmol/L (ref 135–145)

## 2021-06-13 LAB — FERRITIN: Ferritin: 207 ng/mL (ref 11–307)

## 2021-06-13 LAB — IRON AND TIBC
Iron: 56 ug/dL (ref 28–170)
Saturation Ratios: 18 % (ref 10.4–31.8)
TIBC: 314 ug/dL (ref 250–450)
UIBC: 258 ug/dL

## 2021-06-13 LAB — POCT HEMOGLOBIN-HEMACUE: Hemoglobin: 10.4 g/dL — ABNORMAL LOW (ref 12.0–15.0)

## 2021-06-13 MED ORDER — EPOETIN ALFA-EPBX 10000 UNIT/ML IJ SOLN
INTRAMUSCULAR | Status: AC
  Filled 2021-06-13: qty 2

## 2021-06-13 MED ORDER — EPOETIN ALFA-EPBX 10000 UNIT/ML IJ SOLN
15000.0000 [IU] | INTRAMUSCULAR | Status: DC
  Administered 2021-06-13: 15000 [IU] via SUBCUTANEOUS

## 2021-06-14 LAB — PTH, INTACT AND CALCIUM
Calcium, Total (PTH): 8.6 mg/dL — ABNORMAL LOW (ref 8.7–10.3)
PTH: 136 pg/mL — ABNORMAL HIGH (ref 15–65)

## 2021-06-16 DIAGNOSIS — S82409A Unspecified fracture of shaft of unspecified fibula, initial encounter for closed fracture: Secondary | ICD-10-CM | POA: Diagnosis not present

## 2021-06-16 DIAGNOSIS — J9621 Acute and chronic respiratory failure with hypoxia: Secondary | ICD-10-CM | POA: Diagnosis not present

## 2021-06-16 DIAGNOSIS — J1282 Pneumonia due to coronavirus disease 2019: Secondary | ICD-10-CM | POA: Diagnosis not present

## 2021-07-05 DIAGNOSIS — Z23 Encounter for immunization: Secondary | ICD-10-CM | POA: Diagnosis not present

## 2021-07-11 ENCOUNTER — Encounter (HOSPITAL_COMMUNITY): Payer: Medicare HMO

## 2021-07-16 DIAGNOSIS — J1282 Pneumonia due to coronavirus disease 2019: Secondary | ICD-10-CM | POA: Diagnosis not present

## 2021-07-16 DIAGNOSIS — S82409A Unspecified fracture of shaft of unspecified fibula, initial encounter for closed fracture: Secondary | ICD-10-CM | POA: Diagnosis not present

## 2021-07-16 DIAGNOSIS — J9621 Acute and chronic respiratory failure with hypoxia: Secondary | ICD-10-CM | POA: Diagnosis not present

## 2021-07-18 ENCOUNTER — Other Ambulatory Visit: Payer: Self-pay

## 2021-07-18 ENCOUNTER — Ambulatory Visit (HOSPITAL_COMMUNITY)
Admission: RE | Admit: 2021-07-18 | Discharge: 2021-07-18 | Disposition: A | Discharge status: Home / Self Care | Payer: Medicare HMO | Source: Ambulatory Visit | Attending: Internal Medicine | Admitting: Internal Medicine

## 2021-07-18 VITALS — BP 160/59 | HR 73 | Temp 97.4°F | Resp 18

## 2021-07-18 DIAGNOSIS — N179 Acute kidney failure, unspecified: Secondary | ICD-10-CM | POA: Diagnosis not present

## 2021-07-18 DIAGNOSIS — N184 Chronic kidney disease, stage 4 (severe): Secondary | ICD-10-CM | POA: Insufficient documentation

## 2021-07-18 DIAGNOSIS — N189 Chronic kidney disease, unspecified: Secondary | ICD-10-CM | POA: Insufficient documentation

## 2021-07-18 LAB — POCT HEMOGLOBIN-HEMACUE: Hemoglobin: 10.8 g/dL — ABNORMAL LOW (ref 12.0–15.0)

## 2021-07-18 LAB — IRON AND TIBC
Iron: 49 ug/dL (ref 28–170)
Saturation Ratios: 18 % (ref 10.4–31.8)
TIBC: 269 ug/dL (ref 250–450)
UIBC: 220 ug/dL

## 2021-07-18 LAB — FERRITIN: Ferritin: 187 ng/mL (ref 11–307)

## 2021-07-18 MED ORDER — EPOETIN ALFA-EPBX 10000 UNIT/ML IJ SOLN
15000.0000 [IU] | INTRAMUSCULAR | Status: DC
  Administered 2021-07-18: 15000 [IU] via SUBCUTANEOUS

## 2021-07-18 MED ORDER — EPOETIN ALFA-EPBX 10000 UNIT/ML IJ SOLN
INTRAMUSCULAR | Status: AC
  Filled 2021-07-18: qty 2

## 2021-07-25 ENCOUNTER — Other Ambulatory Visit: Payer: Self-pay

## 2021-07-25 NOTE — Patient Outreach (Signed)
[facility]) Care Management  07/25/2021  Latasha [name] 07-Apr-1956 [id number]   Telephone call to patient for disease management follow up.   No answer.  HIPAA compliant voice message left.    Plan: If no return call, RN CM will attempt patient again  in January.  Jone Baseman, RN, MSN [place] [facility] Care Management Coordinator Direct Line [phone] Cell [phone] Toll Free: [phone]  Fax: [phone]

## 2021-08-15 ENCOUNTER — Encounter (HOSPITAL_COMMUNITY)
Admission: RE | Admit: 2021-08-15 | Discharge: 2021-08-15 | Disposition: A | Discharge status: Home / Self Care | Payer: Medicare HMO | Source: Ambulatory Visit | Attending: Internal Medicine | Admitting: Internal Medicine

## 2021-08-15 ENCOUNTER — Other Ambulatory Visit: Payer: Self-pay

## 2021-08-15 VITALS — BP 157/58 | HR 64 | Temp 97.9°F | Resp 18

## 2021-08-15 DIAGNOSIS — N179 Acute kidney failure, unspecified: Secondary | ICD-10-CM | POA: Diagnosis not present

## 2021-08-15 DIAGNOSIS — N184 Chronic kidney disease, stage 4 (severe): Secondary | ICD-10-CM | POA: Diagnosis not present

## 2021-08-15 DIAGNOSIS — N189 Chronic kidney disease, unspecified: Secondary | ICD-10-CM | POA: Diagnosis not present

## 2021-08-15 LAB — IRON AND TIBC
Iron: 68 ug/dL (ref 28–170)
Saturation Ratios: 26 % (ref 10.4–31.8)
TIBC: 260 ug/dL (ref 250–450)
UIBC: 192 ug/dL

## 2021-08-15 LAB — RENAL FUNCTION PANEL
Albumin: 3.4 g/dL — ABNORMAL LOW (ref 3.5–5.0)
Anion gap: 10 (ref 5–15)
BUN: 61 mg/dL — ABNORMAL HIGH (ref 8–23)
CO2: 21 mmol/L — ABNORMAL LOW (ref 22–32)
Calcium: 8.3 mg/dL — ABNORMAL LOW (ref 8.9–10.3)
Chloride: 107 mmol/L (ref 98–111)
Creatinine, Ser: 2.37 mg/dL — ABNORMAL HIGH (ref 0.44–1.00)
GFR, Estimated: 22 mL/min — ABNORMAL LOW (ref >60)
Glucose, Bld: 196 mg/dL — ABNORMAL HIGH (ref 70–99)
Phosphorus: 4.7 mg/dL — ABNORMAL HIGH (ref 2.5–4.6)
Potassium: 4.3 mmol/L (ref 3.5–5.1)
Sodium: 138 mmol/L (ref 135–145)

## 2021-08-15 LAB — POCT HEMOGLOBIN-HEMACUE: Hemoglobin: 10.3 g/dL — ABNORMAL LOW (ref 12.0–15.0)

## 2021-08-15 LAB — FERRITIN: Ferritin: 182 ng/mL (ref 11–307)

## 2021-08-15 MED ORDER — EPOETIN ALFA-EPBX 10000 UNIT/ML IJ SOLN
INTRAMUSCULAR | Status: AC
  Administered 2021-08-15: 15000 [IU] via SUBCUTANEOUS
  Filled 2021-08-15: qty 2

## 2021-08-15 MED ORDER — EPOETIN ALFA-EPBX 10000 UNIT/ML IJ SOLN: 15000.0000 [IU] | INTRAMUSCULAR | Status: DC

## 2021-08-16 DIAGNOSIS — S82409A Unspecified fracture of shaft of unspecified fibula, initial encounter for closed fracture: Secondary | ICD-10-CM | POA: Diagnosis not present

## 2021-08-16 DIAGNOSIS — J9621 Acute and chronic respiratory failure with hypoxia: Secondary | ICD-10-CM | POA: Diagnosis not present

## 2021-08-16 DIAGNOSIS — J1282 Pneumonia due to coronavirus disease 2019: Secondary | ICD-10-CM | POA: Diagnosis not present

## 2021-08-16 LAB — PTH, INTACT AND CALCIUM
Calcium, Total (PTH): 8.4 mg/dL — ABNORMAL LOW (ref 8.7–10.3)
PTH: 99 pg/mL — ABNORMAL HIGH (ref 15–65)

## 2021-08-20 DIAGNOSIS — G629 Polyneuropathy, unspecified: Secondary | ICD-10-CM | POA: Diagnosis not present

## 2021-08-20 DIAGNOSIS — F32A Depression, unspecified: Secondary | ICD-10-CM | POA: Diagnosis not present

## 2021-08-20 DIAGNOSIS — Z6841 Body Mass Index (BMI) 40.0 and over, adult: Secondary | ICD-10-CM | POA: Diagnosis not present

## 2021-08-20 DIAGNOSIS — E1122 Type 2 diabetes mellitus with diabetic chronic kidney disease: Secondary | ICD-10-CM | POA: Diagnosis not present

## 2021-08-20 DIAGNOSIS — N185 Chronic kidney disease, stage 5: Secondary | ICD-10-CM | POA: Diagnosis not present

## 2021-08-20 DIAGNOSIS — D649 Anemia, unspecified: Secondary | ICD-10-CM | POA: Diagnosis not present

## 2021-08-20 DIAGNOSIS — I5030 Unspecified diastolic (congestive) heart failure: Secondary | ICD-10-CM | POA: Diagnosis not present

## 2021-08-20 DIAGNOSIS — I120 Hypertensive chronic kidney disease with stage 5 chronic kidney disease or end stage renal disease: Secondary | ICD-10-CM | POA: Diagnosis not present

## 2021-09-05 ENCOUNTER — Encounter (HOSPITAL_COMMUNITY): Payer: Medicare HMO

## 2021-09-05 DIAGNOSIS — J069 Acute upper respiratory infection, unspecified: Secondary | ICD-10-CM | POA: Diagnosis not present

## 2021-09-05 DIAGNOSIS — R051 Acute cough: Secondary | ICD-10-CM | POA: Diagnosis not present

## 2021-09-05 DIAGNOSIS — R519 Headache, unspecified: Secondary | ICD-10-CM | POA: Diagnosis not present

## 2021-09-05 DIAGNOSIS — R197 Diarrhea, unspecified: Secondary | ICD-10-CM | POA: Diagnosis not present

## 2021-09-06 DIAGNOSIS — Z03818 Encounter for observation for suspected exposure to other biological agents ruled out: Secondary | ICD-10-CM | POA: Diagnosis not present

## 2021-09-06 DIAGNOSIS — J069 Acute upper respiratory infection, unspecified: Secondary | ICD-10-CM | POA: Diagnosis not present

## 2021-09-12 ENCOUNTER — Encounter (HOSPITAL_COMMUNITY): Payer: Medicare HMO

## 2021-09-19 ENCOUNTER — Encounter (HOSPITAL_COMMUNITY): Payer: Medicare HMO

## 2021-09-25 ENCOUNTER — Ambulatory Visit (HOSPITAL_COMMUNITY)
Admission: RE | Admit: 2021-09-25 | Discharge: 2021-09-25 | Disposition: A | Discharge status: Home / Self Care | Payer: Medicare HMO | Source: Ambulatory Visit | Attending: Internal Medicine | Admitting: Internal Medicine

## 2021-09-25 ENCOUNTER — Other Ambulatory Visit: Payer: Self-pay

## 2021-09-25 VITALS — BP 153/65 | HR 65 | Temp 97.4°F | Resp 18

## 2021-09-25 DIAGNOSIS — N179 Acute kidney failure, unspecified: Secondary | ICD-10-CM | POA: Diagnosis not present

## 2021-09-25 DIAGNOSIS — N184 Chronic kidney disease, stage 4 (severe): Secondary | ICD-10-CM | POA: Insufficient documentation

## 2021-09-25 DIAGNOSIS — N189 Chronic kidney disease, unspecified: Secondary | ICD-10-CM | POA: Insufficient documentation

## 2021-09-25 LAB — POCT HEMOGLOBIN-HEMACUE: Hemoglobin: 9.9 g/dL — ABNORMAL LOW (ref 12.0–15.0)

## 2021-09-25 LAB — IRON AND TIBC
Iron: 46 ug/dL (ref 28–170)
Saturation Ratios: 16 % (ref 10.4–31.8)
TIBC: 288 ug/dL (ref 250–450)
UIBC: 242 ug/dL

## 2021-09-25 LAB — FERRITIN: Ferritin: 153 ng/mL (ref 11–307)

## 2021-09-25 MED ORDER — EPOETIN ALFA-EPBX 10000 UNIT/ML IJ SOLN
15000.0000 [IU] | INTRAMUSCULAR | Status: DC
  Administered 2021-09-25: 13:00:00 15000 [IU] via SUBCUTANEOUS

## 2021-09-25 MED ORDER — EPOETIN ALFA-EPBX 10000 UNIT/ML IJ SOLN
INTRAMUSCULAR | Status: AC
  Filled 2021-09-25: qty 2

## 2021-10-10 ENCOUNTER — Encounter (HOSPITAL_COMMUNITY): Payer: Medicare HMO

## 2021-10-16 ENCOUNTER — Encounter (HOSPITAL_COMMUNITY): Payer: Self-pay

## 2021-10-17 ENCOUNTER — Encounter (HOSPITAL_COMMUNITY): Payer: Medicare HMO

## 2021-10-22 ENCOUNTER — Other Ambulatory Visit (HOSPITAL_COMMUNITY): Payer: Self-pay | Admitting: *Deleted

## 2021-10-22 ENCOUNTER — Encounter (HOSPITAL_COMMUNITY): Payer: Self-pay

## 2021-10-23 ENCOUNTER — Ambulatory Visit (HOSPITAL_COMMUNITY)
Admission: RE | Admit: 2021-10-23 | Discharge: 2021-10-23 | Disposition: A | Discharge status: Home / Self Care | Payer: Medicare Other | Source: Ambulatory Visit | Attending: Internal Medicine | Admitting: Internal Medicine

## 2021-10-23 ENCOUNTER — Other Ambulatory Visit: Payer: Self-pay

## 2021-10-23 VITALS — BP 166/64 | HR 64 | Temp 97.9°F | Resp 19

## 2021-10-23 DIAGNOSIS — N184 Chronic kidney disease, stage 4 (severe): Secondary | ICD-10-CM | POA: Diagnosis present

## 2021-10-23 DIAGNOSIS — N179 Acute kidney failure, unspecified: Secondary | ICD-10-CM | POA: Insufficient documentation

## 2021-10-23 DIAGNOSIS — N189 Chronic kidney disease, unspecified: Secondary | ICD-10-CM | POA: Diagnosis present

## 2021-10-23 LAB — IRON AND TIBC
Iron: 57 ug/dL (ref 28–170)
Saturation Ratios: 20 % (ref 10.4–31.8)
TIBC: 281 ug/dL (ref 250–450)
UIBC: 224 ug/dL

## 2021-10-23 LAB — RENAL FUNCTION PANEL
Albumin: 3.4 g/dL — ABNORMAL LOW (ref 3.5–5.0)
Anion gap: 8 (ref 5–15)
BUN: 58 mg/dL — ABNORMAL HIGH (ref 8–23)
CO2: 25 mmol/L (ref 22–32)
Calcium: 8.1 mg/dL — ABNORMAL LOW (ref 8.9–10.3)
Chloride: 107 mmol/L (ref 98–111)
Creatinine, Ser: 2.69 mg/dL — ABNORMAL HIGH (ref 0.44–1.00)
GFR, Estimated: 19 mL/min — ABNORMAL LOW (ref >60)
Glucose, Bld: 182 mg/dL — ABNORMAL HIGH (ref 70–99)
Phosphorus: 4.5 mg/dL (ref 2.5–4.6)
Potassium: 4.8 mmol/L (ref 3.5–5.1)
Sodium: 140 mmol/L (ref 135–145)

## 2021-10-23 LAB — FERRITIN: Ferritin: 142 ng/mL (ref 11–307)

## 2021-10-23 LAB — POCT HEMOGLOBIN-HEMACUE: Hemoglobin: 10 g/dL — ABNORMAL LOW (ref 12.0–15.0)

## 2021-10-23 MED ORDER — EPOETIN ALFA-EPBX 10000 UNIT/ML IJ SOLN
INTRAMUSCULAR | Status: AC
  Filled 2021-10-23: qty 2

## 2021-10-23 MED ORDER — EPOETIN ALFA-EPBX 10000 UNIT/ML IJ SOLN
15000.0000 [IU] | INTRAMUSCULAR | Status: DC
  Administered 2021-10-23: 15000 [IU] via SUBCUTANEOUS

## 2021-10-23 NOTE — Patient Outreach (Signed)
[name]) Care Management  10/23/2021  Latasha [name] 28-Jul-1956 [id number]  CMA made telephone outreach as patient has upcoming appointment with care coordinator Jon Billings, RN. Patients appointment cancelled and stated though she switched from [facility] to [facility], her insurance is set to go back to [facility] beginning 11/07/2021. Patient will await RN's return to reschedule.  Ina Homes [facility] Management Assistant [phone]

## 2021-10-24 ENCOUNTER — Ambulatory Visit: Payer: Self-pay

## 2021-10-24 LAB — PTH, INTACT AND CALCIUM
Calcium, Total (PTH): 8.4 mg/dL — ABNORMAL LOW (ref 8.7–10.3)
PTH: 142 pg/mL — ABNORMAL HIGH (ref 15–65)

## 2021-10-30 ENCOUNTER — Encounter (HOSPITAL_COMMUNITY): Payer: Medicare Other

## 2021-11-07 ENCOUNTER — Encounter (HOSPITAL_COMMUNITY): Payer: Self-pay

## 2021-11-07 DIAGNOSIS — E1122 Type 2 diabetes mellitus with diabetic chronic kidney disease: Secondary | ICD-10-CM | POA: Diagnosis not present

## 2021-11-07 DIAGNOSIS — I5030 Unspecified diastolic (congestive) heart failure: Secondary | ICD-10-CM | POA: Diagnosis not present

## 2021-11-07 DIAGNOSIS — Z6841 Body Mass Index (BMI) 40.0 and over, adult: Secondary | ICD-10-CM | POA: Diagnosis not present

## 2021-11-07 DIAGNOSIS — I120 Hypertensive chronic kidney disease with stage 5 chronic kidney disease or end stage renal disease: Secondary | ICD-10-CM | POA: Diagnosis not present

## 2021-11-07 DIAGNOSIS — D649 Anemia, unspecified: Secondary | ICD-10-CM | POA: Diagnosis not present

## 2021-11-07 DIAGNOSIS — G629 Polyneuropathy, unspecified: Secondary | ICD-10-CM | POA: Diagnosis not present

## 2021-11-07 DIAGNOSIS — N185 Chronic kidney disease, stage 5: Secondary | ICD-10-CM | POA: Diagnosis not present

## 2021-11-09 ENCOUNTER — Encounter (HOSPITAL_COMMUNITY): Payer: Self-pay

## 2021-11-09 ENCOUNTER — Encounter (HOSPITAL_COMMUNITY)
Admission: RE | Admit: 2021-11-09 | Discharge: 2021-11-09 | Disposition: A | Discharge status: Home / Self Care | Payer: Medicare HMO | Source: Ambulatory Visit | Attending: Internal Medicine | Admitting: Internal Medicine

## 2021-11-09 DIAGNOSIS — N189 Chronic kidney disease, unspecified: Secondary | ICD-10-CM | POA: Insufficient documentation

## 2021-11-09 DIAGNOSIS — N184 Chronic kidney disease, stage 4 (severe): Secondary | ICD-10-CM | POA: Diagnosis not present

## 2021-11-09 DIAGNOSIS — N179 Acute kidney failure, unspecified: Secondary | ICD-10-CM | POA: Insufficient documentation

## 2021-11-09 MED ORDER — SODIUM CHLORIDE 0.9 % IV SOLN
510.0000 mg | INTRAVENOUS | Status: DC
  Administered 2021-11-09: 510 mg via INTRAVENOUS
  Filled 2021-11-09: qty 510

## 2021-11-13 DIAGNOSIS — I5032 Chronic diastolic (congestive) heart failure: Secondary | ICD-10-CM | POA: Diagnosis not present

## 2021-11-13 DIAGNOSIS — I10 Essential (primary) hypertension: Secondary | ICD-10-CM | POA: Diagnosis not present

## 2021-11-13 DIAGNOSIS — R0602 Shortness of breath: Secondary | ICD-10-CM | POA: Diagnosis not present

## 2021-11-13 DIAGNOSIS — E1122 Type 2 diabetes mellitus with diabetic chronic kidney disease: Secondary | ICD-10-CM | POA: Diagnosis not present

## 2021-11-13 DIAGNOSIS — E7800 Pure hypercholesterolemia, unspecified: Secondary | ICD-10-CM | POA: Diagnosis not present

## 2021-11-19 ENCOUNTER — Other Ambulatory Visit: Payer: Self-pay | Admitting: Family Medicine

## 2021-11-19 ENCOUNTER — Ambulatory Visit
Admission: RE | Admit: 2021-11-19 | Discharge: 2021-11-19 | Disposition: A | Discharge status: Home / Self Care | Payer: Medicare HMO | Source: Ambulatory Visit | Attending: Family Medicine | Admitting: Family Medicine

## 2021-11-19 DIAGNOSIS — R0602 Shortness of breath: Secondary | ICD-10-CM | POA: Diagnosis not present

## 2021-11-20 ENCOUNTER — Encounter (HOSPITAL_COMMUNITY)
Admission: RE | Admit: 2021-11-20 | Discharge: 2021-11-20 | Disposition: A | Discharge status: Home / Self Care | Payer: Medicare HMO | Source: Ambulatory Visit | Attending: Internal Medicine | Admitting: Internal Medicine

## 2021-11-20 VITALS — BP 124/79 | HR 66 | Temp 97.5°F | Resp 18 | Wt 270.0 lb

## 2021-11-20 DIAGNOSIS — N189 Chronic kidney disease, unspecified: Secondary | ICD-10-CM | POA: Diagnosis not present

## 2021-11-20 DIAGNOSIS — N179 Acute kidney failure, unspecified: Secondary | ICD-10-CM | POA: Diagnosis not present

## 2021-11-20 DIAGNOSIS — N184 Chronic kidney disease, stage 4 (severe): Secondary | ICD-10-CM

## 2021-11-20 LAB — POCT HEMOGLOBIN-HEMACUE: Hemoglobin: 10.8 g/dL — ABNORMAL LOW (ref 12.0–15.0)

## 2021-11-20 MED ORDER — SODIUM CHLORIDE 0.9 % IV SOLN
510.0000 mg | INTRAVENOUS | Status: AC
  Administered 2021-11-20: 510 mg via INTRAVENOUS
  Filled 2021-11-20: qty 510

## 2021-11-20 MED ORDER — EPOETIN ALFA-EPBX 10000 UNIT/ML IJ SOLN
INTRAMUSCULAR | Status: AC
  Administered 2021-11-20: 15000 [IU] via SUBCUTANEOUS
  Filled 2021-11-20: qty 2

## 2021-11-20 MED ORDER — EPOETIN ALFA-EPBX 10000 UNIT/ML IJ SOLN: 15000.0000 [IU] | INTRAMUSCULAR | Status: DC

## 2021-12-17 ENCOUNTER — Telehealth: Payer: Self-pay | Admitting: Cardiovascular Disease

## 2021-12-17 ENCOUNTER — Encounter (HOSPITAL_COMMUNITY): Payer: Self-pay

## 2021-12-17 ENCOUNTER — Observation Stay (HOSPITAL_COMMUNITY): Payer: Medicare HMO

## 2021-12-17 ENCOUNTER — Other Ambulatory Visit: Payer: Self-pay

## 2021-12-17 ENCOUNTER — Observation Stay (HOSPITAL_COMMUNITY)
Admission: EM | Admit: 2021-12-17 | Discharge: 2021-12-18 | Disposition: A | Discharge status: Home / Self Care | Payer: Medicare HMO | Attending: Internal Medicine | Admitting: Internal Medicine

## 2021-12-17 ENCOUNTER — Emergency Department (HOSPITAL_COMMUNITY): Payer: Medicare HMO

## 2021-12-17 DIAGNOSIS — J9601 Acute respiratory failure with hypoxia: Principal | ICD-10-CM | POA: Insufficient documentation

## 2021-12-17 DIAGNOSIS — I130 Hypertensive heart and chronic kidney disease with heart failure and stage 1 through stage 4 chronic kidney disease, or unspecified chronic kidney disease: Secondary | ICD-10-CM | POA: Insufficient documentation

## 2021-12-17 DIAGNOSIS — Z7901 Long term (current) use of anticoagulants: Secondary | ICD-10-CM | POA: Insufficient documentation

## 2021-12-17 DIAGNOSIS — I10 Essential (primary) hypertension: Secondary | ICD-10-CM | POA: Diagnosis not present

## 2021-12-17 DIAGNOSIS — Z79899 Other long term (current) drug therapy: Secondary | ICD-10-CM | POA: Insufficient documentation

## 2021-12-17 DIAGNOSIS — J9620 Acute and chronic respiratory failure, unspecified whether with hypoxia or hypercapnia: Secondary | ICD-10-CM | POA: Diagnosis present

## 2021-12-17 DIAGNOSIS — R0602 Shortness of breath: Secondary | ICD-10-CM | POA: Diagnosis not present

## 2021-12-17 DIAGNOSIS — Z794 Long term (current) use of insulin: Secondary | ICD-10-CM

## 2021-12-17 DIAGNOSIS — J9621 Acute and chronic respiratory failure with hypoxia: Secondary | ICD-10-CM | POA: Diagnosis not present

## 2021-12-17 DIAGNOSIS — J45909 Unspecified asthma, uncomplicated: Secondary | ICD-10-CM | POA: Diagnosis present

## 2021-12-17 DIAGNOSIS — E1165 Type 2 diabetes mellitus with hyperglycemia: Secondary | ICD-10-CM | POA: Diagnosis not present

## 2021-12-17 DIAGNOSIS — R059 Cough, unspecified: Secondary | ICD-10-CM | POA: Diagnosis not present

## 2021-12-17 DIAGNOSIS — Z20822 Contact with and (suspected) exposure to covid-19: Secondary | ICD-10-CM | POA: Diagnosis not present

## 2021-12-17 DIAGNOSIS — E785 Hyperlipidemia, unspecified: Secondary | ICD-10-CM | POA: Diagnosis present

## 2021-12-17 DIAGNOSIS — N184 Chronic kidney disease, stage 4 (severe): Secondary | ICD-10-CM | POA: Diagnosis not present

## 2021-12-17 DIAGNOSIS — I5032 Chronic diastolic (congestive) heart failure: Secondary | ICD-10-CM | POA: Diagnosis not present

## 2021-12-17 DIAGNOSIS — R0600 Dyspnea, unspecified: Secondary | ICD-10-CM

## 2021-12-17 DIAGNOSIS — E119 Type 2 diabetes mellitus without complications: Secondary | ICD-10-CM

## 2021-12-17 DIAGNOSIS — R7989 Other specified abnormal findings of blood chemistry: Secondary | ICD-10-CM | POA: Diagnosis not present

## 2021-12-17 LAB — GLUCOSE, CAPILLARY
Glucose-Capillary: 308 mg/dL — ABNORMAL HIGH (ref 70–99)
Glucose-Capillary: 370 mg/dL — ABNORMAL HIGH (ref 70–99)
Glucose-Capillary: 391 mg/dL — ABNORMAL HIGH (ref 70–99)

## 2021-12-17 LAB — CBC WITH DIFFERENTIAL/PLATELET
Abs Immature Granulocytes: 0.02 10*3/uL (ref 0.00–0.07)
Basophils Absolute: 0 10*3/uL (ref 0.0–0.1)
Basophils Relative: 0 %
Eosinophils Absolute: 0.6 10*3/uL — ABNORMAL HIGH (ref 0.0–0.5)
Eosinophils Relative: 9 %
HCT: 36.3 % (ref 36.0–46.0)
Hemoglobin: 11.4 g/dL — ABNORMAL LOW (ref 12.0–15.0)
Immature Granulocytes: 0 %
Lymphocytes Relative: 31 %
Lymphs Abs: 2.2 10*3/uL (ref 0.7–4.0)
MCH: 28 pg (ref 26.0–34.0)
MCHC: 31.4 g/dL (ref 30.0–36.0)
MCV: 89.2 fL (ref 80.0–100.0)
Monocytes Absolute: 0.5 10*3/uL (ref 0.1–1.0)
Monocytes Relative: 7 %
Neutro Abs: 3.6 10*3/uL (ref 1.7–7.7)
Neutrophils Relative %: 53 %
Platelets: 244 10*3/uL (ref 150–400)
RBC: 4.07 MIL/uL (ref 3.87–5.11)
RDW: 14.1 % (ref 11.5–15.5)
WBC: 7 10*3/uL (ref 4.0–10.5)
nRBC: 0 % (ref 0.0–0.2)

## 2021-12-17 LAB — BASIC METABOLIC PANEL
Anion gap: 11 (ref 5–15)
BUN: 80 mg/dL — ABNORMAL HIGH (ref 8–23)
CO2: 25 mmol/L (ref 22–32)
Calcium: 8.3 mg/dL — ABNORMAL LOW (ref 8.9–10.3)
Chloride: 102 mmol/L (ref 98–111)
Creatinine, Ser: 3.23 mg/dL — ABNORMAL HIGH (ref 0.44–1.00)
GFR, Estimated: 15 mL/min — ABNORMAL LOW (ref >60)
Glucose, Bld: 106 mg/dL — ABNORMAL HIGH (ref 70–99)
Potassium: 4.4 mmol/L (ref 3.5–5.1)
Sodium: 138 mmol/L (ref 135–145)

## 2021-12-17 LAB — D-DIMER, QUANTITATIVE: D-Dimer, Quant: 0.77 ug/mL-FEU — ABNORMAL HIGH (ref 0.00–0.50)

## 2021-12-17 LAB — BRAIN NATRIURETIC PEPTIDE: B Natriuretic Peptide: 16.1 pg/mL (ref 0.0–100.0)

## 2021-12-17 LAB — RESP PANEL BY RT-PCR (FLU A&B, COVID) ARPGX2
Influenza A by PCR: NEGATIVE
Influenza B by PCR: NEGATIVE
SARS Coronavirus 2 by RT PCR: NEGATIVE

## 2021-12-17 MED ORDER — INSULIN ASPART 100 UNIT/ML IJ SOLN
0.0000 [IU] | Freq: Three times a day (TID) | INTRAMUSCULAR | Status: DC
  Administered 2021-12-18: 8 [IU] via SUBCUTANEOUS

## 2021-12-17 MED ORDER — ENOXAPARIN SODIUM 30 MG/0.3ML IJ SOSY
30.0000 mg | PREFILLED_SYRINGE | INTRAMUSCULAR | Status: DC
  Administered 2021-12-17: 30 mg via SUBCUTANEOUS
  Filled 2021-12-17: qty 0.3

## 2021-12-17 MED ORDER — PANTOPRAZOLE SODIUM 40 MG PO TBEC
40.0000 mg | DELAYED_RELEASE_TABLET | Freq: Every day | ORAL | Status: DC
  Administered 2021-12-18: 40 mg via ORAL
  Filled 2021-12-17: qty 1

## 2021-12-17 MED ORDER — FUROSEMIDE 40 MG PO TABS: 160.0000 mg | ORAL_TABLET | Freq: Every day | ORAL | Status: DC

## 2021-12-17 MED ORDER — ALBUTEROL SULFATE (2.5 MG/3ML) 0.083% IN NEBU
5.0000 mg | INHALATION_SOLUTION | Freq: Once | RESPIRATORY_TRACT | Status: AC
Start: 2021-12-17 — End: 2021-12-17
  Administered 2021-12-17: 5 mg via RESPIRATORY_TRACT
  Filled 2021-12-17: qty 6

## 2021-12-17 MED ORDER — AMLODIPINE BESYLATE 10 MG PO TABS
10.0000 mg | ORAL_TABLET | Freq: Every day | ORAL | Status: DC
  Administered 2021-12-18: 10 mg via ORAL
  Filled 2021-12-17: qty 1

## 2021-12-17 MED ORDER — SODIUM CHLORIDE 0.9 % IV SOLN: INTRAVENOUS | Status: DC

## 2021-12-17 MED ORDER — ATORVASTATIN CALCIUM 40 MG PO TABS
40.0000 mg | ORAL_TABLET | Freq: Every day | ORAL | Status: DC
Start: 2021-12-18 — End: 2021-12-18
  Administered 2021-12-18: 40 mg via ORAL
  Filled 2021-12-17: qty 1

## 2021-12-17 MED ORDER — ALBUTEROL SULFATE (2.5 MG/3ML) 0.083% IN NEBU
5.0000 mg | INHALATION_SOLUTION | Freq: Once | RESPIRATORY_TRACT | Status: AC
  Administered 2021-12-17: 5 mg via RESPIRATORY_TRACT
  Filled 2021-12-17: qty 6

## 2021-12-17 MED ORDER — INSULIN ASPART PROT & ASPART (70-30 MIX) 100 UNIT/ML ~~LOC~~ SUSP
50.0000 [IU] | Freq: Every day | SUBCUTANEOUS | Status: DC
  Administered 2021-12-17: 50 [IU] via SUBCUTANEOUS

## 2021-12-17 MED ORDER — ALBUTEROL SULFATE (2.5 MG/3ML) 0.083% IN NEBU: 2.5000 mg | INHALATION_SOLUTION | RESPIRATORY_TRACT | Status: DC | PRN

## 2021-12-17 MED ORDER — TECHNETIUM TO 99M ALBUMIN AGGREGATED
4.4000 | Freq: Once | INTRAVENOUS | Status: AC
  Administered 2021-12-17: 4.4 via INTRAVENOUS

## 2021-12-17 MED ORDER — INSULIN ASPART PROT & ASPART (70-30 MIX) 100 UNIT/ML ~~LOC~~ SUSP: 50.0000 [IU] | SUBCUTANEOUS | Status: DC

## 2021-12-17 MED ORDER — ALBUTEROL SULFATE HFA 108 (90 BASE) MCG/ACT IN AERS: 2.0000 | INHALATION_SPRAY | RESPIRATORY_TRACT | Status: DC | PRN

## 2021-12-17 MED ORDER — FUROSEMIDE 40 MG PO TABS
160.0000 mg | ORAL_TABLET | Freq: Two times a day (BID) | ORAL | Status: DC
  Administered 2021-12-17 – 2021-12-18 (×2): 160 mg via ORAL
  Filled 2021-12-17 (×2): qty 4

## 2021-12-17 MED ORDER — PREDNISONE 20 MG PO TABS
40.0000 mg | ORAL_TABLET | Freq: Every day | ORAL | Status: DC
  Administered 2021-12-18: 40 mg via ORAL
  Filled 2021-12-17: qty 2

## 2021-12-17 MED ORDER — CARVEDILOL 25 MG PO TABS
25.0000 mg | ORAL_TABLET | Freq: Two times a day (BID) | ORAL | Status: DC
  Administered 2021-12-17 – 2021-12-18 (×2): 25 mg via ORAL
  Filled 2021-12-17 (×2): qty 1

## 2021-12-17 MED ORDER — IPRATROPIUM-ALBUTEROL 0.5-2.5 (3) MG/3ML IN SOLN
3.0000 mL | Freq: Four times a day (QID) | RESPIRATORY_TRACT | Status: DC
  Administered 2021-12-17 – 2021-12-18 (×3): 3 mL via RESPIRATORY_TRACT
  Filled 2021-12-17 (×3): qty 3

## 2021-12-17 MED ORDER — POTASSIUM CHLORIDE CRYS ER 20 MEQ PO TBCR
20.0000 meq | EXTENDED_RELEASE_TABLET | Freq: Every day | ORAL | Status: DC
  Administered 2021-12-18: 20 meq via ORAL
  Filled 2021-12-17: qty 1

## 2021-12-17 MED ORDER — ONDANSETRON HCL 4 MG PO TABS: 4.0000 mg | ORAL_TABLET | Freq: Four times a day (QID) | ORAL | Status: DC | PRN

## 2021-12-17 MED ORDER — DEXTROMETHORPHAN POLISTIREX ER 30 MG/5ML PO SUER
15.0000 mg | Freq: Two times a day (BID) | ORAL | Status: DC
  Administered 2021-12-17 – 2021-12-18 (×2): 15 mg via ORAL
  Filled 2021-12-17 (×2): qty 5

## 2021-12-17 MED ORDER — IPRATROPIUM BROMIDE 0.02 % IN SOLN
0.5000 mg | Freq: Once | RESPIRATORY_TRACT | Status: AC
Start: 2021-12-17 — End: 2021-12-17
  Administered 2021-12-17: 0.5 mg via RESPIRATORY_TRACT
  Filled 2021-12-17: qty 2.5

## 2021-12-17 MED ORDER — METHYLPREDNISOLONE SODIUM SUCC 125 MG IJ SOLR
125.0000 mg | INTRAMUSCULAR | Status: AC
  Administered 2021-12-17: 125 mg via INTRAVENOUS
  Filled 2021-12-17: qty 2

## 2021-12-17 MED ORDER — ONDANSETRON HCL 4 MG/2ML IJ SOLN: 4.0000 mg | Freq: Four times a day (QID) | INTRAMUSCULAR | Status: DC | PRN

## 2021-12-17 MED ORDER — NITROGLYCERIN 0.4 MG SL SUBL: 0.4000 mg | SUBLINGUAL_TABLET | SUBLINGUAL | Status: DC | PRN

## 2021-12-17 MED ORDER — INSULIN ASPART PROT & ASPART (70-30 MIX) 100 UNIT/ML ~~LOC~~ SUSP
60.0000 [IU] | Freq: Every day | SUBCUTANEOUS | Status: DC
  Administered 2021-12-18: 60 [IU] via SUBCUTANEOUS
  Filled 2021-12-17: qty 10

## 2021-12-17 NOTE — H&P (Signed)
History and Physical    Patient: Latasha [name] [id number] DOB: 15-Nov-1955 DOA: 12/17/2021 DOS: the patient was seen and examined on 12/17/2021 PCP: Shirline Frees, MD  Patient coming from: Home  Chief Complaint:  Chief Complaint  Patient presents with   Shortness of Breath   Cough   Wheezing   HPI: Latasha [name] is a 66 y.o. female with medical history significant of BPPV, chronic diastolic heart failure, stage IV CKD, insulin requiring type II DM, class III obesity, hyperlipidemia, hypertension, left atrial enlargement, palpitations who is coming to the emergency department complaints of progressively worse cough since November associated with dyspnea and wheezing that does not respond to her albuterol MDI at home.  She saw her PCP in December who prescribed her Tessalon Perles along the albuterol MDI.  She is scheduled to see pulmonology early next month.  She denied fever, cigarette smoking, environmental allergies, but stated she worked in the [employer] for 30 years and secondhand smoke exposure.  Cough is mostly nonproductive, but occasionally some yellowish phlegm is expectorated.  She has pleuritic CP due to the cough.  Denied precordial CP, palpitations, dizziness, diaphoresis, but stated she gets frequent lower extremity edema.  She has constipated, but denied nausea, emesis, diarrhea, melena or hematochezia.  No flank pain, dysuria, frequency or hematuria.  No polyuria, polydipsia, polyphagia or blurred vision.  ED course: Initial vital signs were temperature 98.3 F, pulse 78, respiration 20, BP 158/94 and O2 sat 93% on room air.  The patient received Solu-Medrol 125 mg IVP and a 5 mg albuterol +0.5 mg ipratropium continuous neb.  However, she desaturated to the 80s while ambulating a short distance in the emergency department.  VQ scan was ordered and still pending.  Lab work: CBC showed a white count 7.0, Emollin 11.4 g/dL platelets 244.  D-dimer 0.77.  BNP was normal.   BMP showed a glucose of 106, BUN 80, creatinine 3.23 and calcium 8.3 mg/dL.  The rest of the electrolytes were normal.  Imaging: 2 view chest radiograph with no active cardiopulmonary abnormalities.  Review of Systems: As mentioned in the history of present illness. All other systems reviewed and are negative. Past Medical History:  Diagnosis Date   BPPV (benign paroxysmal positional vertigo)    Chronic congestive heart failure with left ventricular diastolic dysfunction (HCC)    CKD (chronic kidney disease), stage IV ([place]) 05/15/2017   Diabetes mellitus    Hyperlipidemia    Hypertension    Left atrial enlargement    Morbid obesity with BMI of 40.0-44.9, adult [facility])    Palpitations    Past Surgical History:  Procedure Laterality Date   ABDOMINAL HYSTERECTOMY     BIOPSY  11/09/2020   Procedure: BIOPSY;  Surgeon: Wilford Corner, MD;  Location: [facility] ENDOSCOPY;  Service: Endoscopy;;   ESOPHAGOGASTRODUODENOSCOPY N/A 11/09/2020   Procedure: ESOPHAGOGASTRODUODENOSCOPY (EGD);  Surgeon: Wilford Corner, MD;  Location: [facility];  Service: Endoscopy;  Laterality: N/A;   EYE SURGERY     cataract/left eye, right eye had bleeding in back   IR FLUORO GUIDE CV LINE RIGHT  10/26/2020   IR US GUIDE VASC ACCESS RIGHT  10/26/2020   KNEE SURGERY     LEFT HEART CATH AND CORONARY ANGIOGRAPHY N/A 03/09/2018   Procedure: LEFT HEART CATH AND CORONARY ANGIOGRAPHY;  Surgeon: Lorretta Harp, MD;  Location: [place] CV LAB;  Service: Cardiovascular;  Laterality: N/A;   TRIGGER FINGER RELEASE     Social History:  reports that she  has never smoked. She has never used smokeless tobacco. She reports current alcohol use. She reports that she does not use drugs.  Allergies  Allergen Reactions   Hydrocodone-Acetaminophen Other (See Comments)    Unknown reaction   Oxycodone Hcl Other (See Comments)    Unknown reaction   Amitriptyline Nausea Only   Citalopram Other (See Comments)    Nightmares     Family  History  Problem Relation Age of Onset   Heart disease Father    Hypertension Father    Alzheimer's disease Mother    Diabetes Mother    Diabetes Sister    Dementia Other     Prior to Admission medications   Medication Sig Start Date End Date Taking? Authorizing Provider  acetaminophen (TYLENOL) 500 MG tablet Take 1,000 mg by mouth every 6 (six) hours as needed for mild pain, fever or headache.   Yes [provider]  albuterol (PROVENTIL HFA;VENTOLIN HFA) 108 (90 Base) MCG/ACT inhaler Inhale 2 puffs into the lungs every 6 (six) hours as needed for wheezing or shortness of breath.   Yes [provider]  amLODipine (NORVASC) 10 MG tablet Take 1 tablet (10 mg total) by mouth daily. 05/18/17  Yes Eugenie Filler, MD  atorvastatin (LIPITOR) 40 MG tablet Take 40 mg by mouth daily.    Yes [provider]  Biotin w/ Vitamins C & E (HAIR SKIN & NAILS GUMMIES PO) Take 2 tablets by mouth daily.   Yes [provider]  carvedilol (COREG) 25 MG tablet Take 1 tablet (25 mg total) by mouth 2 (two) times daily with a meal. 03/10/18  Yes Florencia Reasons, MD  furosemide (LASIX) 80 MG tablet TAKE 1 TABLET (80 MG TOTAL) BY MOUTH DAILY. Patient taking differently: Take 160 mg by mouth See admin instructions. Take 160 mg by mouth in the morning and additional 160 mg once daily on 12/17/2021, 12/18/2021, and 12/19/2021. Resume regular once-daily dose of 160 mg on 12/20/2021, unless otherwise instructed by provider. 11/01/20 02/09/22 Yes Donne Hazel, MD  insulin aspart protamine- aspart (NOVOLOG MIX 70/30) (70-30) 100 UNIT/ML injection Inject 50-60 Units into the skin See admin instructions. Inject 60 units into the skin in the morning and 50 units in the evening   Yes [provider]  pantoprazole (PROTONIX) 40 MG tablet Take 1 tablet (40 mg total) by mouth 2 (two) times daily for 30 days, THEN 1 tablet (40 mg total) daily. Patient taking differently: Take 40 mg by mouth in the  morning before breakfast 11/12/20 02/09/22 Yes [place] Eric J, DO  potassium chloride SA (KLOR-CON) 20 MEQ tablet Take 20 mEq by mouth daily.   Yes [provider]  traMADol (ULTRAM) 50 MG tablet Take 50-100 mg by mouth daily as needed (for pain).   Yes [provider]  gabapentin (NEURONTIN) 100 MG capsule Take 100 mg by mouth in the morning and at bedtime. Patient not taking: Reported on 12/17/2021 05/31/21   [provider]  nitroGLYCERIN (NITROSTAT) 0.4 MG SL tablet Place 1 tablet (0.4 mg total) under the tongue every 5 (five) minutes as needed for chest pain. Patient not taking: Reported on 12/17/2021 03/10/18   Florencia Reasons, MD    Physical Exam: Vitals:   12/17/21 1500 12/17/21 1530 12/17/21 1600 12/17/21 1637  BP: (!) 174/72 (!) 177/79 (!) 169/75   Pulse: 70 70 70 70  Resp: (!) 22 20 (!) 22 (!) 22  Temp:   98.5 F (36.9 C)   TempSrc:  Oral   SpO2: 95% 95% 92% 93%  Weight:      Height:       Physical Exam Vitals and nursing note reviewed.  Constitutional:      Appearance: She is obese.  HENT:     Head: Normocephalic.  Eyes:     Pupils: Pupils are equal, round, and reactive to light.  Neck:     Vascular: No JVD.  Cardiovascular:     Rate and Rhythm: Normal rate and regular rhythm.     Heart sounds: S1 normal and S2 normal.     Comments: Stage I lymphedema. Pulmonary:     Breath sounds: Examination of the right-middle field reveals decreased breath sounds. Examination of the left-middle field reveals decreased breath sounds. Examination of the right-lower field reveals decreased breath sounds. Examination of the left-lower field reveals decreased breath sounds. Decreased breath sounds, wheezing and rhonchi present. No rales.  Musculoskeletal:     Cervical back: Neck supple.     Right lower leg: 1+ Edema present.     Left lower leg: 1+ Edema present.  Skin:    General: Skin is warm and dry.  Neurological:     General: No focal deficit present.      Mental Status: She is alert and oriented to person, place, and time.  Psychiatric:        Mood and Affect: Mood normal.    Data Reviewed:  There are no new results to review at this time.  Assessment and Plan: Principal Problem:   Acute on chronic respiratory failure with hypoxia (HCC) In the setting of   Reactive airway disease with wheezing Observation/telemetry. Continue supplemental oxygen as needed. DuoNeb 4 times a day. Albuterol neb every 4 hours as needed. Prednisone 40 mg p.o. daily beginning tomorrow. She is scheduled to see pulmonology in early April.  Active Problems:   Elevated d-dimer VQ scan performed. Awaiting official results.    Insulin-requiring or dependent type II diabetes mellitus (HCC) Carbohydrate modified diet. Continue home dosing 70/30 NovoLog Mix. CBG monitoring with RI SS while on glucocorticoids.    Essential hypertension Continue amlodipine 10 mg p.o. daily. Continue furosemide per nephrology. Continue carvedilol 25 mg p.o. twice daily. Monitor BP, HR, renal function electrolytes.    Hyperlipemia Currently not on medical therapy. Follow-up with primary care provider.    Chronic diastolic CHF (congestive heart failure) (HCC) No signs of decompensation. Continue carvedilol and furosemide.    CKD (chronic kidney disease) stage 4, GFR 15-29 ml/min (HCC) Being followed by nephrology. Nephrology asked her to use furosemide twice daily until Wednesday. Continue potassium supplementation. Monitor renal function and electrolytes closely.    Class 3 obesity (HCC) Lifestyle modifications.   Follow-up with PCP.    Advance Care Planning:   Code Status: Full Code   Consults:   Family Communication:   Severity of Illness: The appropriate patient status for this patient is OBSERVATION. Observation status is judged to be reasonable and necessary in order to provide the required intensity of service to ensure the patient's safety. The  patient's presenting symptoms, physical exam findings, and initial radiographic and laboratory data in the context of their medical condition is felt to place them at decreased risk for further clinical deterioration. Furthermore, it is anticipated that the patient will be medically stable for discharge from the hospital within 2 midnights of admission.   Author: Reubin Milan, MD 12/17/2021 5:51 PM  For on call review [email or web address].   This document was created  using Systems analyst may contain some unintended transcription errors.

## 2021-12-17 NOTE — ED Provider Notes (Signed)
?[facility] DEPT ?Provider Note ? ? ?CSN: [id number] ?Arrival date & time: 12/17/21  1134 ? ?  ? ?History ? ?Chief Complaint  ?Patient presents with  ? Shortness of Breath  ? Cough  ? Wheezing  ? ? ?Latasha [name] is a 66 y.o. female. ? ?66 year old female presents with several week history of cough and congestion.  States that is gradually getting worse.  Has had some audible wheezing.  Denies any prior history of pulmonary disease.  Denies any lower extremity edema.  No fever or chills.  No vomiting or diarrhea.  Called her doctor and was told to come here today for further evaluation ? ? ?  ? ?Home Medications ?Prior to Admission medications   ?Medication Sig Start Date End Date Taking? Authorizing Provider  ?acetaminophen (TYLENOL) 500 MG tablet Take 1,000 mg by mouth every 6 (six) hours as needed for mild pain, fever or headache.    [provider]  ?albuterol (PROVENTIL HFA;VENTOLIN HFA) 108 (90 Base) MCG/ACT inhaler Inhale 2 puffs into the lungs every 6 (six) hours as needed for wheezing or shortness of breath.    [provider]  ?amLODipine (NORVASC) 10 MG tablet Take 1 tablet (10 mg total) by mouth daily. 05/18/17   Eugenie Filler, MD  ?atorvastatin (LIPITOR) 40 MG tablet Take 40 mg by mouth daily.     [provider]  ?Biotin w/ Vitamins C & E (HAIR SKIN & NAILS GUMMIES PO) Take 2 capsules by mouth daily.    [provider]  ?carvedilol (COREG) 25 MG tablet Take 1 tablet (25 mg total) by mouth 2 (two) times daily with a meal. 03/10/18   Florencia Reasons, MD  ?furosemide (LASIX) 80 MG tablet TAKE 1 TABLET (80 MG TOTAL) BY MOUTH DAILY. ?Patient taking differently: Take 80 mg by mouth See admin instructions. Take 80 mg daily, may take a second 80 mg dose as needed for swelling 11/01/20 11/01/21  Donne Hazel, MD  ?insulin aspart protamine- aspart (NOVOLOG MIX 70/30) (70-30) 100 UNIT/ML injection Inject 50-60 Units into the skin See admin  instructions. Use 60 units before breakfast and use 50 units before supper    [provider]  ?nitroGLYCERIN (NITROSTAT) 0.4 MG SL tablet Place 1 tablet (0.4 mg total) under the tongue every 5 (five) minutes as needed for chest pain. ?Patient not taking: Reported on 03/21/2021 03/10/18   Florencia Reasons, MD  ?pantoprazole (PROTONIX) 40 MG tablet Take 1 tablet (40 mg total) by mouth 2 (two) times daily for 30 days, THEN 1 tablet (40 mg total) daily. ?Patient taking differently: Take 40 mg by mouth once daily 11/12/20 03/21/21  [place] Donnamarie Poag, DO  ?potassium chloride SA (KLOR-CON) 20 MEQ tablet Take 20 mEq by mouth daily.    [provider]  ?traMADol (ULTRAM) 50 MG tablet Take 50 mg by mouth 2 (two) times daily as needed for moderate pain.    [provider]  ?   ? ?Allergies    ?Hydrocodone-acetaminophen, Oxycodone hcl, and Citalopram   ? ?Review of Systems   ?Review of Systems  ?All other systems reviewed and are negative. ? ?Physical Exam ?Updated Vital Signs ?BP (!) 158/94 (BP Location: Left Arm)   Pulse 78   Temp 98.3 ?F (36.8 ?C) (Oral)   Resp 20   Ht 1.638 m (5' 4.5")   Wt 122.5 kg   SpO2 93%   BMI 45.63 kg/m?  ?Physical Exam ?Vitals and nursing note reviewed.  ?  Constitutional:   ?   General: She is not in acute distress. ?   Appearance: Normal appearance. She is well-developed. She is not toxic-appearing.  ?HENT:  ?   Head: Normocephalic and atraumatic.  ?Eyes:  ?   General: Lids are normal.  ?   Conjunctiva/sclera: Conjunctivae normal.  ?   Pupils: Pupils are equal, round, and reactive to light.  ?Neck:  ?   Thyroid: No thyroid mass.  ?   Trachea: No tracheal deviation.  ?Cardiovascular:  ?   Rate and Rhythm: Normal rate and regular rhythm.  ?   Heart sounds: Normal heart sounds. No murmur heard. ?  No gallop.  ?Pulmonary:  ?   Effort: Pulmonary effort is normal. No respiratory distress.  ?   Breath sounds: No stridor. Examination of the right-upper field reveals wheezing. Examination  of the left-upper field reveals wheezing. Wheezing present. No decreased breath sounds, rhonchi or rales.  ?Abdominal:  ?   General: There is no distension.  ?   Palpations: Abdomen is soft.  ?   Tenderness: There is no abdominal tenderness. There is no rebound.  ?Musculoskeletal:     ?   General: No tenderness. Normal range of motion.  ?   Cervical back: Normal range of motion and neck supple.  ?Lymphadenopathy:  ?   Comments: 2+ bilateral lower extremity pitting edema  ?Skin: ?   General: Skin is warm and dry.  ?   Findings: No abrasion or rash.  ?Neurological:  ?   Mental Status: She is alert and oriented to person, place, and time. Mental status is at baseline.  ?   GCS: GCS eye subscore is 4. GCS verbal subscore is 5. GCS motor subscore is 6.  ?   Cranial Nerves: No cranial nerve deficit.  ?   Sensory: No sensory deficit.  ?   Motor: Motor function is intact.  ?Psychiatric:     ?   Attention and Perception: Attention normal.     ?   Speech: Speech normal.     ?   Behavior: Behavior normal.  ? ? ?ED Results / Procedures / Treatments   ?Labs ?(all labs ordered are listed, but only abnormal results are displayed) ?Labs Reviewed  ?CBC WITH DIFFERENTIAL/PLATELET  ?BASIC METABOLIC PANEL  ?BRAIN NATRIURETIC PEPTIDE  ? ? ?EKG ?EKG Interpretation ? ?Date/Time:  Monday December 17 2021 12:00:07 EDT ?Ventricular Rate:  68 ?PR Interval:  176 ?QRS Duration: 100 ?QT Interval:  412 ?QTC Calculation: 439 ?R Axis:   4 ?Text Interpretation: Sinus rhythm Borderline T wave abnormalities Minimal ST elevation, anterior leads No significant change since last tracing Confirmed by Lacretia Leigh ([id number]) on 12/17/2021 12:18:06 PM ? ?Radiology ?No results found. ? ?Procedures ?Procedures  ? ? ?Medications Ordered in ED ?Medications  ?0.9 %  sodium chloride infusion (has no administration in time range)  ?albuterol (PROVENTIL) (2.5 MG/3ML) 0.083% nebulizer solution 5 mg (has no administration in time range)  ?ipratropium (ATROVENT) nebulizer  solution 0.5 mg (has no administration in time range)  ? ? ?ED Course/ Medical Decision Making/ A&P ?  ?                        ?Medical Decision Making ?Amount and/or Complexity of Data Reviewed ?Labs: ordered. ?Radiology: ordered. ? ?Risk ?Prescription drug management. ? ? ?Patient's EKG per my interpretation shows normal sinus rhythm.  Patient's clinical condition concerning for CHF.  Chest x-ray did not show any acute  infiltrate.  Patient has spoke with her nephrologist who recently increased her dose of Lasix starting today.  Renal function does show worsening BUN/creatinine.  Patient had wheezing that was treated with albuterol x2.  Patient also given Solu-Medrol for concerning for bronchitis.  BNP negative but D-dimer elevated and could not age-adjusted.  Patient cannot have a CT of the chest due to her chronic kidney disease.  Patient will require admission at this time for VQ scan as well as further monitoring of her renal status. ? ? ? ? ? ? ? ?Final Clinical Impression(s) / ED Diagnoses ?Final diagnoses:  ?None  ? ? ?Rx / DC Orders ?ED Discharge Orders   ? ? None  ? ?  ? ? ?  ?Lacretia Leigh, MD ?12/17/21 1513 ? ?

## 2021-12-17 NOTE — Progress Notes (Signed)
Continuous pulse ox ordered. Connected pt to bedside continuous pulse ox machine. ?

## 2021-12-17 NOTE — Telephone Encounter (Signed)
PCP office called. Patient was at their office Friday. Pharmacist reported a 10 lb weight gain over past two months. Patient reported to the pharmacist that she has only been urinating every two hours but has previously been urinating every hour. Patient also reported to pharmacist that she is sob "all the time". The PCP Office attempted to contact our office Friday but was not able to get through. The following information was what was provided by the clinical staff at the PCP office ? ? ? ? ?Pt c/o swelling: STAT is pt has developed SOB within 24 hours ? ?If swelling, where is the swelling located? No specific area  ? ?How much weight have you gained and in what time span? 10 lbs in two months  ? ?Have you gained 3 pounds in a day or 5 pounds in a week?  ? ?Do you have a log of your daily weights (if so, list)? ? ?Are you currently taking a fluid pill? Yes Furosemide 80 mg twice daily  ? ?Are you currently SOB? All the time ? ?Have you traveled recently? Not sure ?

## 2021-12-17 NOTE — ED Notes (Addendum)
Ambulated pt to bathroom and back to room, after monitor connection Sp02 read 84% with perfect pleth and raised to 90% after one minute. Nurse advised. ?

## 2021-12-17 NOTE — Telephone Encounter (Signed)
Patient stated she has had a cough since Nov. She has gained 10 pounds over the past 2 months, but her ankles are not swollen and her shoes and clothes do not fit any tighter. He nephrologist increased her lasix 80 mg to 2 in the am and 2 in the pm for 3 days. She stated she feels tired and weak and has no energy. She stated, "I have a rattling noise in my chest." She has audible wheezing. I had her use her albuterol inhaler while on the phone. She has insp. and exp. wheezing. I recommended that she go the ED to be evaluated. She said she would have her daughter take her. ?

## 2021-12-17 NOTE — Plan of Care (Signed)

## 2021-12-17 NOTE — ED Notes (Signed)
Ambulated pt to the bathroom and back to room. After connecting to monitor Sp02 read 85% with perfect pleth. Raised back to 90% after a 1 minute. RN aware. ?

## 2021-12-17 NOTE — ED Triage Notes (Signed)
Patient reports that she has had increased SOB and cough since December, 2023. Patient states that it has gotten gradually worse.  ?Patient called her Cardiologist today and was referred to the ED. Patient also c/o expiratory wheezing that is unrelieved with her Albuterol inhaler. ?

## 2021-12-18 ENCOUNTER — Inpatient Hospital Stay (HOSPITAL_COMMUNITY)
Admission: RE | Admit: 2021-12-18 | Discharge: 2021-12-18 | Disposition: A | Discharge status: Home / Self Care | Payer: Medicare HMO | Source: Ambulatory Visit | Attending: Internal Medicine | Admitting: Internal Medicine

## 2021-12-18 DIAGNOSIS — J45901 Unspecified asthma with (acute) exacerbation: Secondary | ICD-10-CM

## 2021-12-18 DIAGNOSIS — I5032 Chronic diastolic (congestive) heart failure: Secondary | ICD-10-CM | POA: Diagnosis not present

## 2021-12-18 DIAGNOSIS — R7989 Other specified abnormal findings of blood chemistry: Secondary | ICD-10-CM | POA: Diagnosis not present

## 2021-12-18 DIAGNOSIS — I10 Essential (primary) hypertension: Secondary | ICD-10-CM | POA: Diagnosis not present

## 2021-12-18 DIAGNOSIS — N184 Chronic kidney disease, stage 4 (severe): Secondary | ICD-10-CM | POA: Diagnosis not present

## 2021-12-18 DIAGNOSIS — J441 Chronic obstructive pulmonary disease with (acute) exacerbation: Secondary | ICD-10-CM | POA: Diagnosis not present

## 2021-12-18 LAB — GLUCOSE, CAPILLARY
Glucose-Capillary: 298 mg/dL — ABNORMAL HIGH (ref 70–99)
Glucose-Capillary: 297 mg/dL — ABNORMAL HIGH (ref 70–99)

## 2021-12-18 LAB — COMPREHENSIVE METABOLIC PANEL
ALT: 20 U/L (ref 0–44)
AST: 24 U/L (ref 15–41)
Albumin: 4 g/dL (ref 3.5–5.0)
Alkaline Phosphatase: 83 U/L (ref 38–126)
Anion gap: 12 (ref 5–15)
BUN: 87 mg/dL — ABNORMAL HIGH (ref 8–23)
CO2: 22 mmol/L (ref 22–32)
Calcium: 8.3 mg/dL — ABNORMAL LOW (ref 8.9–10.3)
Chloride: 100 mmol/L (ref 98–111)
Creatinine, Ser: 3.35 mg/dL — ABNORMAL HIGH (ref 0.44–1.00)
GFR, Estimated: 15 mL/min — ABNORMAL LOW (ref >60)
Glucose, Bld: 309 mg/dL — ABNORMAL HIGH (ref 70–99)
Potassium: 4.5 mmol/L (ref 3.5–5.1)
Sodium: 134 mmol/L — ABNORMAL LOW (ref 135–145)
Total Bilirubin: 0.2 mg/dL — ABNORMAL LOW (ref 0.3–1.2)
Total Protein: 8.3 g/dL — ABNORMAL HIGH (ref 6.5–8.1)

## 2021-12-18 LAB — HIV ANTIBODY (ROUTINE TESTING W REFLEX): HIV Screen 4th Generation wRfx: NONREACTIVE

## 2021-12-18 LAB — MAGNESIUM: Magnesium: 2.4 mg/dL (ref 1.7–2.4)

## 2021-12-18 MED ORDER — PANTOPRAZOLE SODIUM 40 MG PO TBEC: DELAYED_RELEASE_TABLET | ORAL | Status: DC

## 2021-12-18 MED ORDER — BUDESONIDE-FORMOTEROL FUMARATE 160-4.5 MCG/ACT IN AERO
2.0000 | INHALATION_SPRAY | Freq: Two times a day (BID) | RESPIRATORY_TRACT | 0 refills | Status: DC
Start: 2021-12-18 — End: 2022-09-25

## 2021-12-18 MED ORDER — MELATONIN 3 MG PO TABS
3.0000 mg | ORAL_TABLET | Freq: Once | ORAL | Status: AC
  Administered 2021-12-18: 3 mg via ORAL
  Filled 2021-12-18: qty 1

## 2021-12-18 MED ORDER — FUROSEMIDE 80 MG PO TABS: 160.0000 mg | ORAL_TABLET | ORAL | Status: DC

## 2021-12-18 MED ORDER — PREDNISONE 20 MG PO TABS: 40.0000 mg | ORAL_TABLET | Freq: Every day | ORAL | 0 refills | Status: AC

## 2021-12-18 MED ORDER — HYDRALAZINE HCL 20 MG/ML IJ SOLN
5.0000 mg | Freq: Once | INTRAMUSCULAR | Status: AC
  Administered 2021-12-18: 5 mg via INTRAVENOUS
  Filled 2021-12-18: qty 1

## 2021-12-18 MED ORDER — INSULIN ASPART 100 UNIT/ML IJ SOLN
8.0000 [IU] | Freq: Once | INTRAMUSCULAR | Status: AC
  Administered 2021-12-18: 8 [IU] via SUBCUTANEOUS

## 2021-12-18 MED ORDER — GUAIFENESIN-DM 100-10 MG/5ML PO SYRP: 10.0000 mL | ORAL_SOLUTION | ORAL | 0 refills | Status: DC | PRN

## 2021-12-18 NOTE — Progress Notes (Signed)
SATURATION QUALIFICATIONS: (This note is used to comply with regulatory documentation for home oxygen)  Patient Saturations on Room Air at Rest = 95%  Patient Saturations on Room Air while Ambulating = 93%  Chayna Surratt V Alister Staver, RN  

## 2021-12-18 NOTE — Discharge Summary (Signed)
Physician Discharge Summary  [name] GYK:[id number] DOB: 1956/05/14 DOA: 12/17/2021 ? ?PCP: Shirline Frees, MD ? ?Admit date: 12/17/2021 ?Discharge date: 12/18/2021 ? ?Admitted From: Home ?Disposition: Home ? ?Recommendations for Outpatient Follow-up:  ?Follow up with PCP in 1 week with repeat CBC/BMP ?Outpatient follow-up with pulmonary and nephrology ?Follow up in ED if symptoms worsen or new appear ? ? ?Home Health: No ?Equipment/Devices: None ? ?Discharge Condition: Stable ?CODE STATUS: Full ?Diet recommendation: Heart healthy/carb modified/fluid restriction of upto 1500 cc a day with ? ?Brief/Interim Summary: ?66 y.o. female with medical history significant of BPPV, chronic diastolic heart failure, stage IV CKD, insulin requiring type II DM, class III obesity, hyperlipidemia, hypertension presented with worsening shortness of breath, cough and wheezing.  On presentation, chest x-ray was negative for acute infiltrates.  VQ scan was negative for PE.  She required supplemental oxygen on presentation.  She was treated with IV steroids and nebs.  During the hospitalization, her condition has improved.  Her respiratory status has improved and she is not requiring supplemental oxygen even on walking.  She will be discharged home today on oral prednisone, inhaled steroids along with as needed albuterol.  Outpatient follow-up with PCP/pulmonary/nephrology. ? ?Discharge Diagnoses:  ? ?Acute respiratory failure with hypoxia ?Possible COPD with exacerbation/reactive airway disease with wheezing ?-Required supplemental oxygen on presentation.  Chest x-ray negative for infiltrates.  VQ scan was negative for PE. ?- She was treated with IV steroids and nebs.  During the hospitalization, her condition has improved.  Her respiratory status has improved and she is not requiring supplemental oxygen even on walking.  She will be discharged home today on oral prednisone (prednisone 40 mg daily for 7 days), inhaled steroids along  with as needed albuterol.  Outpatient follow-up with PCP/pulmonary. ? ?Elevated D-dimer ?-VQ scan was negative for PE ? ?Diabetes mellitus type 2 with hyperglycemia ?-Carb modified diet.  Continue home regimen ? ?CKD stage IV ?-Patient is on Lasix 160 mg twice a day for 3 days as an outpatient by nephrology.  Continue same.  Creatinine slightly elevated than baseline currently.  Communicated with Dr. Johnney Ou via secure chat who will arrange for outpatient follow-up next week.  Outpatient follow-up of BMP. ? ?Chronic diastolic CHF ?-Compensated.  Continue Lasix plan as above.  Continue Coreg.  Outpatient follow-up with cardiology.  Diet and fluid restrictions ? ?Morbid obesity ?-Outpatient follow-up ? ?Hyperlipidemia ?-Not on medications.  Outpatient follow-up ? ?Hypertension ?-Continue home regimen. ? ? ?Discharge Instructions ? ?Discharge Instructions   ? ? Diet - low sodium heart healthy   Complete by: As directed ?  ? Diet Carb Modified   Complete by: As directed ?  ? Increase activity slowly   Complete by: As directed ?  ? ?  ? ?Allergies as of 12/18/2021   ? ?   Reactions  ? Hydrocodone-acetaminophen Other (See Comments)  ? Unknown reaction  ? Oxycodone Hcl Other (See Comments)  ? Unknown reaction  ? Amitriptyline Nausea Only  ? Citalopram Other (See Comments)  ? Nightmares   ? ?  ? ?  ?Medication List  ?  ? ?STOP taking these medications   ? ?gabapentin 100 MG capsule ?Commonly known as: NEURONTIN ?  ? ?  ? ?TAKE these medications   ? ?acetaminophen 500 MG tablet ?Commonly known as: TYLENOL ?Take 1,000 mg by mouth every 6 (six) hours as needed for mild pain, fever or headache. ?  ?albuterol 108 (90 Base) MCG/ACT inhaler ?Commonly known as: VENTOLIN HFA ?  Inhale 2 puffs into the lungs every 6 (six) hours as needed for wheezing or shortness of breath. ?  ?amLODipine 10 MG tablet ?Commonly known as: NORVASC ?Take 1 tablet (10 mg total) by mouth daily. ?  ?atorvastatin 40 MG tablet ?Commonly known as: LIPITOR ?Take  40 mg by mouth daily. ?  ?budesonide-formoterol 160-4.5 MCG/ACT inhaler ?Commonly known as: Symbicort ?Inhale 2 puffs into the lungs 2 (two) times daily. ?  ?carvedilol 25 MG tablet ?Commonly known as: COREG ?Take 1 tablet (25 mg total) by mouth 2 (two) times daily with a meal. ?  ?furosemide 80 MG tablet ?Commonly known as: LASIX ?Take 2 tablets (160 mg total) by mouth See admin instructions. Take 160 mg by mouth in the morning and additional 160 mg once daily on 12/17/2021, 12/18/2021, and 12/19/2021. Resume regular once-daily dose of 160 mg on 12/20/2021, unless otherwise instructed by provider. ?  ?guaiFENesin-dextromethorphan 100-10 MG/5ML syrup ?Commonly known as: ROBITUSSIN DM ?Take 10 mLs by mouth every 4 (four) hours as needed for cough. ?  ?HAIR SKIN & NAILS GUMMIES PO ?Take 2 tablets by mouth daily. ?  ?insulin aspart protamine- aspart (70-30) 100 UNIT/ML injection ?Commonly known as: NOVOLOG MIX 70/30 ?Inject 50-60 Units into the skin See admin instructions. Inject 60 units into the skin in the morning and 50 units in the evening ?  ?nitroGLYCERIN 0.4 MG SL tablet ?Commonly known as: NITROSTAT ?Place 1 tablet (0.4 mg total) under the tongue every 5 (five) minutes as needed for chest pain. ?  ?pantoprazole 40 MG tablet ?Commonly known as: Protonix ?Take 40 mg by mouth in the morning before breakfast ?  ?potassium chloride SA 20 MEQ tablet ?Commonly known as: KLOR-CON M ?Take 20 mEq by mouth daily. ?  ?predniSONE 20 MG tablet ?Commonly known as: DELTASONE ?Take 2 tablets (40 mg total) by mouth daily with breakfast for 7 days. ?Start taking on: December 19, 2021 ?  ?traMADol 50 MG tablet ?Commonly known as: ULTRAM ?Take 50-100 mg by mouth daily as needed (for pain). ?  ? ?  ? ? Follow-up Information   ? ? Shirline Frees, MD. Schedule an appointment as soon as possible for a visit in 1 week(s).   ?Specialty: Family Medicine ?Why: with bmp ?Contact information: ?[address] ?[address] ?[place]  [address] ?[phone] ? ? ?  ?  ? ? Lorretta Harp, MD .   ?Specialties: Cardiology, Radiology ?Contact information: ?[address] ?[address] ?[place] [address] ?[phone] ? ? ?  ?  ? ? Justin Mend, MD. Schedule an appointment as soon as possible for a visit in 1 week(s).   ?Specialty: Internal Medicine ?Contact information: ?[address] ?[place] [address] ?[phone] ? ? ?  ?  ? ?  ?  ? ?  ? ?Allergies  ?Allergen Reactions  ? Hydrocodone-Acetaminophen Other (See Comments)  ?  Unknown reaction  ? Oxycodone Hcl Other (See Comments)  ?  Unknown reaction  ? Amitriptyline Nausea Only  ? Citalopram Other (See Comments)  ?  Nightmares   ? ? ?Consultations: ?None ? ? ?Procedures/Studies: ?DG Chest 2 View ? ?Result Date: 12/17/2021 ?CLINICAL DATA:  Shortness of breath and cough. EXAM: CHEST - 2 VIEW COMPARISON:  11/19/2021 FINDINGS: Heart size and mediastinal contours are unremarkable. No pleural effusion or edema identified. No airspace opacities. The visualized osseous structures are unremarkable. IMPRESSION: No active cardiopulmonary abnormalities. Electronically Signed   By: Kerby Moors M.D.   On: 12/17/2021 12:48  ? ?  DG Chest 2 View ? ?Result Date: 11/19/2021 ?CLINICAL DATA:  66 year old female with shortness of breath EXAM: CHEST - 2 VIEW COMPARISON:  01/05/2021 FINDINGS: Cardiomediastinal silhouette unchanged in size and contour. No evidence of central vascular congestion. No interlobular septal thickening. Improved appearance of the previous interlobular septal thickening/reticular opacities. No pneumothorax or pleural effusion. Coarsened interstitial markings, with no confluent airspace disease. No acute displaced fracture. Degenerative changes of the spine. IMPRESSION: Improved appearance of the chest x-ray, with no evidence of acute cardiopulmonary disease Electronically Signed   By: Corrie Mckusick D.O.   On: 11/19/2021 13:06  ? ?NM Pulmonary Perfusion ? ?Result Date: 12/17/2021 ?CLINICAL  DATA:  Cough, shortness of breath EXAM: NUCLEAR MEDICINE PERFUSION LUNG SCAN TECHNIQUE: Perfusion images were obtained in multiple projections after intravenous injection of radiopharmaceutical. Ventilation scans i

## 2021-12-19 ENCOUNTER — Other Ambulatory Visit: Payer: Self-pay

## 2021-12-19 LAB — HEMOGLOBIN A1C
Hgb A1c MFr Bld: 7.7 % — ABNORMAL HIGH (ref 4.8–5.6)
Mean Plasma Glucose: 174 mg/dL

## 2021-12-19 NOTE — Patient Instructions (Signed)
Patient Goals/Self-Care Activities: Diabetes ?Take all medications as prescribed ?Attend all scheduled provider appointments ?check blood sugar at prescribed times: twice daily ?enter blood sugar readings and medication or insulin into daily log ?take the blood sugar log to all doctor visits ?fill half of plate with vegetables ?manage portion size ?

## 2021-12-19 NOTE — Patient Outreach (Signed)
[facility]) Care Management Telephonic RN Care Manager Note   12/19/2021 Name:  Latasha [name] MRN:  [id number] DOB:  1956/06/09  Summary: Telephone call to patient for follow up. Patient states she is doing better. She has an ER visit due to a lingering cough.  She states that after being given some steroids and breathing treatments she is better. However, now her sugars are high.  Stressed to patient to really control her carbohydrates and sugars. She verbalized understanding.     Subjective: Latasha [name] is an 66 y.o. year old female who is a primary patient of Johny Blamer, MD. The care management team was consulted for assistance with care management and/or care coordination needs.    Telephonic RN Care Manager completed Telephone Visit today.  Objective:   Medications Reviewed Today     Reviewed by Fleeta Emmer, RN (Case Manager) on 12/19/21 at 1047  Med List Status: <None>   Medication Order Taking? Sig Documenting Provider Last Dose Status Informant  acetaminophen (TYLENOL) 500 MG tablet [id number] Yes Take 1,000 mg by mouth every 6 (six) hours as needed for mild pain, fever or headache. [provider] Taking Active Self  albuterol (PROVENTIL HFA;VENTOLIN HFA) 108 (90 Base) MCG/ACT inhaler [id number] Yes Inhale 2 puffs into the lungs every 6 (six) hours as needed for wheezing or shortness of breath. [provider] Taking Active Self  amLODipine (NORVASC) 10 MG tablet [id number] Yes Take 1 tablet (10 mg total) by mouth daily. Rodolph Bong, MD Taking Active Self  atorvastatin (LIPITOR) 40 MG tablet [id number] Yes Take 40 mg by mouth daily.  [provider] Taking Active Self  Biotin w/ Vitamins C & E (HAIR SKIN & NAILS GUMMIES PO) [id number] Yes Take 2 tablets by mouth daily. [provider] Taking Active Self  budesonide-formoterol (SYMBICORT) 160-4.5 MCG/ACT inhaler [id number] Yes Inhale 2 puffs into the lungs 2 (two)  times daily. Glade Lloyd, MD Taking Active   carvedilol (COREG) 25 MG tablet [id number] Yes Take 1 tablet (25 mg total) by mouth 2 (two) times daily with a meal. Albertine Grates, MD Taking Active Self  furosemide (LASIX) 80 MG tablet [id number] Yes Take 2 tablets (160 mg total) by mouth See admin instructions. Take 160 mg by mouth in the morning and additional 160 mg once daily on 12/17/2021, 12/18/2021, and 12/19/2021. Resume regular once-daily dose of 160 mg on 12/20/2021, unless otherwise instructed by provider. Glade Lloyd, MD Taking Active   guaiFENesin-dextromethorphan [facility] DM) 100-10 MG/5ML syrup [id number] Yes Take 10 mLs by mouth every 4 (four) hours as needed for cough. Glade Lloyd, MD Taking Active   insulin aspart protamine- aspart (NOVOLOG MIX 70/30) (70-30) 100 UNIT/ML injection [id number]  Inject 50-60 Units into the skin See admin instructions. Inject 60 units into the skin in the morning and 50 units in the evening [provider]  Active Self  nitroGLYCERIN (NITROSTAT) 0.4 MG SL tablet [id number] No Place 1 tablet (0.4 mg total) under the tongue every 5 (five) minutes as needed for chest pain.  Patient not taking: Reported on 12/17/2021   Albertine Grates, MD Not Taking Active Self  pantoprazole (PROTONIX) 40 MG tablet [id number] Yes Take 40 mg by mouth in the morning before breakfast Glade Lloyd, MD Taking Active   potassium chloride SA (KLOR-CON) 20 MEQ tablet [id number] Yes Take 20 mEq by mouth daily. [provider] Taking Active Self  predniSONE (DELTASONE) 20 MG tablet [id number] Yes Take 2 tablets (40 mg total)  by mouth daily with breakfast for 7 days. Glade Lloyd, MD Taking Active   traMADol Janean Sark) 50 MG tablet [id number] Yes Take 50-100 mg by mouth daily as needed (for pain). [provider] Taking Active Self             SDOH:   SDOH Screenings   Alcohol Screen: Not on file  Depression (PHQ2-9): Low Risk    PHQ-2 Score: 1  Financial  Resource Strain: Not on file  Food Insecurity: No Food Insecurity   Worried About Running Out of Food in the Last Year: Never true   Ran Out of Food in the Last Year: Never true  Housing: Low Risk    Last Housing Risk Score: 0  Physical Activity: Not on file  Social Connections: Not on file  Stress: Not on file  Tobacco Use: Low Risk    Smoking Tobacco Use: Never   Smokeless Tobacco Use: Never   Passive Exposure: Not on file  Transportation Needs: No Transportation Needs   Lack of Transportation (Medical): No   Lack of Transportation (Non-Medical): No     Care Plan  Review of patient past medical history, allergies, medications, health status, including review of consultants reports, laboratory and other test data, was performed as part of comprehensive evaluation for care management services.   Care Plan : General Plan of Care (Adult)  Updates made by Fleeta Emmer, RN since 12/19/2021 12:00 AM  Completed 12/19/2021   Problem: Health Promotion or Disease Self-Management (General Plan of Care) recent COVID 19 Resolved 12/19/2021     Problem: Coping Skills (General Plan of Care)- COVID recovery Resolved 12/19/2021     Problem: Self Management Resolved 12/19/2021     Care Plan : Diabetes Type 2 (Adult)  Updates made by Fleeta Emmer, RN since 12/19/2021 12:00 AM  Completed 12/19/2021   Problem: Glycemic Management (Diabetes, Type 2) Resolved 12/19/2021     Long-Range Goal: Glycemic Management Optimized as evidenced by A1c less than 7. Completed 12/19/2021  Start Date: 11/03/2020  Expected End Date: 06/06/2021  Recent Progress: On track  Priority: High  Note:   Evidence-based guidance:   Anticipate A1C testing (point-of-care) every 3 to 6 months based on goal attainment Promote self-monitoring of blood glucose levels.  Assess and address barriers to management plan, such as food insecurity, age, developmental ability, depression, anxiety, fear of hypoglycemia or weight gain, as  well as medication cost, side effects and complicated regimen.   Notes: 12/19/21 closing duplicate goal    Task: Alleviate Barriers to Glycemic Management Completed 12/19/2021  Due Date: 06/06/2021  Priority: Routine  Responsible User: Fleeta Emmer, RN  Note:   Care Management Activities:    - blood glucose readings reviewed - self-awareness of signs/symptoms of hypo or hyperglycemia encouraged - use of blood glucose monitoring log promoted    Notes: 11/13/20 Patient checks sugars regularly.  Last sugar check 103.12/08/20 blood sugar report today 130.  Keep up the great work!! 01/10/21 Continues to monitor blood sugar readings.      Care Plan : RN CM plan of care  Updates made by Fleeta Emmer, RN since 12/19/2021 12:00 AM     Problem: Chronic Disease management and care coordination of diabetes   Priority: High     Long-Range Goal: Devoelpment of plan of care for the management of diabetes   Start Date: 12/19/2021  Expected End Date: 10/06/2022  This Visit's Progress: On track  Priority: High  Note:   Current Barriers:  Chronic  Disease Management support and education needs related to DMII   RNCM Clinical Goal(s):  Patient will verbalize basic understanding of  DMII disease process and self health management plan as evidenced by A1c less than 7.5 continue to work with RN Care Manager to address care management and care coordination needs related to  DMII as evidenced by adherence to CM Team Scheduled appointments through collaboration with RN Care manager, provider, and care team.   Interventions: Education and support related to diabetes Inter-disciplinary care team collaboration (see longitudinal plan of care) Evaluation of current treatment plan related to  self management and patient's adherence to plan as established by provider   Diabetes Interventions:  (Status:  Goal on track:  Yes.) Long Term Goal Assessed patient's understanding of A1c goal: <7% Provided education to  patient about basic DM disease process Discussed plans with patient for ongoing care management follow up and provided patient with direct contact information for care management team Lab Results  Component Value Date   HGBA1C 7.7 (H) 12/18/2021     Patient Goals/Self-Care Activities: Diabetes Take all medications as prescribed Attend all scheduled provider appointments check blood sugar at prescribed times: twice daily enter blood sugar readings and medication or insulin into daily log take the blood sugar log to all doctor visits fill half of plate with vegetables manage portion size  12/19/21 Patient on steroids due to episodic coughing.  Blood sugars running higher per patient. Last check was 390.  Encouraged patient to keep her diet tighter and really limiting carbohydrates and sweets.     Follow Up Plan:  Telephone follow up appointment with care management team member scheduled for:  April The patient has been provided with contact information for the care management team and has been advised to call with any health related questions or concerns.        Plan:  Telephone follow up appointment with care management team member scheduled for:  April The patient has been provided with contact information for the care management team and has been advised to call with any health related questions or concerns.  Follow-up: Patient agrees to Care Plan and Follow-up.   Bary Leriche, RN, MSN [facility] Care Management Care Management Coordinator Direct Line [phone] Toll Free: [phone]  Fax: [phone]

## 2021-12-25 DIAGNOSIS — E7800 Pure hypercholesterolemia, unspecified: Secondary | ICD-10-CM | POA: Diagnosis not present

## 2021-12-25 DIAGNOSIS — G8929 Other chronic pain: Secondary | ICD-10-CM | POA: Diagnosis not present

## 2021-12-25 DIAGNOSIS — I10 Essential (primary) hypertension: Secondary | ICD-10-CM | POA: Diagnosis not present

## 2021-12-25 DIAGNOSIS — I5032 Chronic diastolic (congestive) heart failure: Secondary | ICD-10-CM | POA: Diagnosis not present

## 2021-12-28 DIAGNOSIS — I10 Essential (primary) hypertension: Secondary | ICD-10-CM | POA: Diagnosis not present

## 2021-12-28 DIAGNOSIS — E7800 Pure hypercholesterolemia, unspecified: Secondary | ICD-10-CM | POA: Diagnosis not present

## 2021-12-28 DIAGNOSIS — E1122 Type 2 diabetes mellitus with diabetic chronic kidney disease: Secondary | ICD-10-CM | POA: Diagnosis not present

## 2021-12-28 DIAGNOSIS — J9601 Acute respiratory failure with hypoxia: Secondary | ICD-10-CM | POA: Diagnosis not present

## 2021-12-28 DIAGNOSIS — J449 Chronic obstructive pulmonary disease, unspecified: Secondary | ICD-10-CM | POA: Diagnosis not present

## 2021-12-28 DIAGNOSIS — E1140 Type 2 diabetes mellitus with diabetic neuropathy, unspecified: Secondary | ICD-10-CM | POA: Diagnosis not present

## 2021-12-31 DIAGNOSIS — I5030 Unspecified diastolic (congestive) heart failure: Secondary | ICD-10-CM | POA: Diagnosis not present

## 2021-12-31 DIAGNOSIS — R809 Proteinuria, unspecified: Secondary | ICD-10-CM | POA: Diagnosis not present

## 2021-12-31 DIAGNOSIS — D649 Anemia, unspecified: Secondary | ICD-10-CM | POA: Diagnosis not present

## 2021-12-31 DIAGNOSIS — E1122 Type 2 diabetes mellitus with diabetic chronic kidney disease: Secondary | ICD-10-CM | POA: Diagnosis not present

## 2021-12-31 DIAGNOSIS — N2581 Secondary hyperparathyroidism of renal origin: Secondary | ICD-10-CM | POA: Diagnosis not present

## 2021-12-31 DIAGNOSIS — N185 Chronic kidney disease, stage 5: Secondary | ICD-10-CM | POA: Diagnosis not present

## 2021-12-31 DIAGNOSIS — Z6841 Body Mass Index (BMI) 40.0 and over, adult: Secondary | ICD-10-CM | POA: Diagnosis not present

## 2021-12-31 DIAGNOSIS — G629 Polyneuropathy, unspecified: Secondary | ICD-10-CM | POA: Diagnosis not present

## 2021-12-31 DIAGNOSIS — I120 Hypertensive chronic kidney disease with stage 5 chronic kidney disease or end stage renal disease: Secondary | ICD-10-CM | POA: Diagnosis not present

## 2022-01-07 DIAGNOSIS — N2581 Secondary hyperparathyroidism of renal origin: Secondary | ICD-10-CM | POA: Diagnosis not present

## 2022-01-07 DIAGNOSIS — N185 Chronic kidney disease, stage 5: Secondary | ICD-10-CM | POA: Diagnosis not present

## 2022-01-11 ENCOUNTER — Institutional Professional Consult (permissible substitution): Payer: Medicare HMO | Admitting: Pulmonary Disease

## 2022-01-14 ENCOUNTER — Ambulatory Visit (HOSPITAL_COMMUNITY)
Admission: RE | Admit: 2022-01-14 | Discharge: 2022-01-14 | Disposition: A | Discharge status: Home / Self Care | Payer: Medicare HMO | Source: Ambulatory Visit | Attending: Internal Medicine | Admitting: Internal Medicine

## 2022-01-14 VITALS — BP 156/80 | HR 79 | Temp 97.5°F | Resp 20

## 2022-01-14 DIAGNOSIS — N184 Chronic kidney disease, stage 4 (severe): Secondary | ICD-10-CM | POA: Diagnosis not present

## 2022-01-14 DIAGNOSIS — N189 Chronic kidney disease, unspecified: Secondary | ICD-10-CM | POA: Insufficient documentation

## 2022-01-14 DIAGNOSIS — N179 Acute kidney failure, unspecified: Secondary | ICD-10-CM | POA: Insufficient documentation

## 2022-01-14 LAB — RENAL FUNCTION PANEL
Albumin: 3.6 g/dL (ref 3.5–5.0)
Anion gap: 7 (ref 5–15)
BUN: 76 mg/dL — ABNORMAL HIGH (ref 8–23)
CO2: 25 mmol/L (ref 22–32)
Calcium: 8.2 mg/dL — ABNORMAL LOW (ref 8.9–10.3)
Chloride: 107 mmol/L (ref 98–111)
Creatinine, Ser: 3.25 mg/dL — ABNORMAL HIGH (ref 0.44–1.00)
GFR, Estimated: 15 mL/min — ABNORMAL LOW (ref >60)
Glucose, Bld: 223 mg/dL — ABNORMAL HIGH (ref 70–99)
Phosphorus: 4.2 mg/dL (ref 2.5–4.6)
Potassium: 4.7 mmol/L (ref 3.5–5.1)
Sodium: 139 mmol/L (ref 135–145)

## 2022-01-14 LAB — IRON AND TIBC
Iron: 52 ug/dL (ref 28–170)
Saturation Ratios: 18 % (ref 10.4–31.8)
TIBC: 281 ug/dL (ref 250–450)
UIBC: 229 ug/dL

## 2022-01-14 LAB — FERRITIN: Ferritin: 391 ng/mL — ABNORMAL HIGH (ref 11–307)

## 2022-01-14 LAB — POCT HEMOGLOBIN-HEMACUE: Hemoglobin: 11.4 g/dL — ABNORMAL LOW (ref 12.0–15.0)

## 2022-01-14 MED ORDER — EPOETIN ALFA-EPBX 10000 UNIT/ML IJ SOLN
15000.0000 [IU] | INTRAMUSCULAR | Status: DC
  Administered 2022-01-14: 15000 [IU] via SUBCUTANEOUS

## 2022-01-14 MED ORDER — EPOETIN ALFA-EPBX 10000 UNIT/ML IJ SOLN
INTRAMUSCULAR | Status: AC
  Filled 2022-01-14: qty 2

## 2022-01-15 ENCOUNTER — Encounter (HOSPITAL_COMMUNITY): Payer: Medicare HMO

## 2022-01-15 LAB — PTH, INTACT AND CALCIUM
Calcium, Total (PTH): 8.5 mg/dL — ABNORMAL LOW (ref 8.7–10.3)
PTH: 141 pg/mL — ABNORMAL HIGH (ref 15–65)

## 2022-01-16 DIAGNOSIS — I10 Essential (primary) hypertension: Secondary | ICD-10-CM | POA: Diagnosis not present

## 2022-01-16 DIAGNOSIS — G8929 Other chronic pain: Secondary | ICD-10-CM | POA: Diagnosis not present

## 2022-01-16 DIAGNOSIS — E1122 Type 2 diabetes mellitus with diabetic chronic kidney disease: Secondary | ICD-10-CM | POA: Diagnosis not present

## 2022-01-16 DIAGNOSIS — E7800 Pure hypercholesterolemia, unspecified: Secondary | ICD-10-CM | POA: Diagnosis not present

## 2022-01-17 ENCOUNTER — Other Ambulatory Visit: Payer: Self-pay

## 2022-01-17 NOTE — Patient Outreach (Signed)
[facility]) Care Management ? ?01/17/2022 ? ?Latasha [name] ?[birth year] ?[id number] ? ? ?Telephone call to patient for disease management follow up.   No answer.  HIPAA compliant voice message left.   ? ?Plan: If no return call, RN CM will attempt patient again in May. ? ?Jone Baseman, RN, MSN ?[facility] Care Management ?Care Management Coordinator ?Direct Line [phone] ?Toll Free: [phone]  ?Fax: [phone] ? ?

## 2022-01-22 ENCOUNTER — Encounter: Payer: Self-pay | Admitting: Cardiovascular Disease

## 2022-01-22 ENCOUNTER — Ambulatory Visit: Payer: Medicare HMO | Admitting: Cardiovascular Disease

## 2022-01-22 DIAGNOSIS — R079 Chest pain, unspecified: Secondary | ICD-10-CM | POA: Diagnosis not present

## 2022-01-22 DIAGNOSIS — E782 Mixed hyperlipidemia: Secondary | ICD-10-CM

## 2022-01-22 DIAGNOSIS — R002 Palpitations: Secondary | ICD-10-CM

## 2022-01-22 DIAGNOSIS — G4733 Obstructive sleep apnea (adult) (pediatric): Secondary | ICD-10-CM

## 2022-01-22 DIAGNOSIS — I5032 Chronic diastolic (congestive) heart failure: Secondary | ICD-10-CM | POA: Diagnosis not present

## 2022-01-22 DIAGNOSIS — I10 Essential (primary) hypertension: Secondary | ICD-10-CM

## 2022-01-22 HISTORY — DX: Obstructive sleep apnea (adult) (pediatric): G47.33

## 2022-01-22 NOTE — Assessment & Plan Note (Signed)
Patient's body habitus, nocturnal snoring and daytime somnolence all consistent with obstructive sleep apnea.  She is scheduled to see pulmonologist in the near future to discuss this. ?

## 2022-01-22 NOTE — Assessment & Plan Note (Signed)
History of chest pain in the past with cardiac catheterization performed 03/09/2018 which was entirely normal suggesting that her abnormal Myoview was falsely positive. ?

## 2022-01-22 NOTE — Progress Notes (Signed)
? ? ? ?01/22/2022 ?[name]   ?Feb 14, 1956  ?[id number] ? ?Primary Physician Shirline Frees, MD ?Primary Cardiologist: Lorretta Harp MD Lupe Car[place], [place] ? ?HPI:  Latasha [name] is a 66 y.o.  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  She was referred by the [facility] emergency room (Dr. Julianne Rice) for evaluation of palpitations.  I last saw her in the office 02/27/2021.  She is accompanied by her daughter Patricia Pesa.  She has a history of hypertension, diabetes and hyperlipidemia. Her father did die at age 85 of a myocardial infarction. She has never had a heart attack or stroke. She denies chest pain but has had some dyspnea on exertion which she attributes increasing weight related to dietary indiscretion and inactivity. She does suffer from chronic vertigo. She stopped ingesting caffeine approximately 3 weeks ago. She does admit to having a lot of stress in her life both at home and at work. On 05/26/16 she was seen in the emergency room with palpitations. Her workup was negative. Interestingly, 2 years ago she was worked up by Dr. Fransico Him who did a 2-D echo that was normal and a Holter monitor that showed PVCs. Her palpitations resolved after 4 days. ?  ?I performed diagnostic coronary angiography on her 03/09/2018 after admission for chest pain and abnormal Myoview.  Her cath was clean suggesting false positive stress test. ?  ?She was admitted to [facility] 12/12/6576 with diastolic heart failure.  She was diuresed.  Her serum creatinine was elevated and she was discharged home the following day.  A 2D echo performed at the time of hospitalization revealed normal EF.  Since being home she feels clinically improved.  She denies chest pain or shortness of breath.  She does avoid salt.  She was exploring the possibility of bariatric surgery for weight reduction. ?  ?She was  hospitalized at [facility] in January for COVID-pneumonia for 11 days.  She did have stage IV CKD and was treated with IV remdesivir.  Her serum creatinine went up to 7 but is back down to the high 2 range.  She is getting vein for potential AV fistula.  She also had a GI bleed after that hospitalization.  She does complain of dyspnea on exertion but denies chest pain.  She also has symptoms compatible with obstructive sleep apnea is being currently evaluated for this. ? ?Since I saw her in the office a year ago she was recently hospitalized last month for respiratory failure thought to be related to COPD exacerbation.  She denies chest pain.  Her palpitations are markedly improved.  She is on high-dose furosemide and her weight has increased 13 pounds since I saw her a year ago.  She has 2-3+ pitting edema.  She is followed by nephrologist with creatinines in the mid 3 range. ? ? ?Current Meds  ?Medication Sig  ? acetaminophen (TYLENOL) 500 MG tablet Take 1,000 mg by mouth every 6 (six) hours as needed for mild pain, fever or headache.  ? albuterol (PROVENTIL HFA;VENTOLIN HFA) 108 (90 Base) MCG/ACT inhaler Inhale 2 puffs into the lungs every 6 (six) hours as needed for wheezing or shortness of breath.  ? amLODipine (NORVASC) 10 MG tablet Take 1 tablet (10 mg total) by mouth daily.  ? atorvastatin (LIPITOR) 40 MG tablet Take 40 mg by mouth daily.   ? Biotin w/ Vitamins  C & E (HAIR SKIN & NAILS GUMMIES PO) Take 2 tablets by mouth daily.  ? budesonide-formoterol (SYMBICORT) 160-4.5 MCG/ACT inhaler Inhale 2 puffs into the lungs 2 (two) times daily.  ? carvedilol (COREG) 25 MG tablet Take 1 tablet (25 mg total) by mouth 2 (two) times daily with a meal.  ? furosemide (LASIX) 80 MG tablet Take 2 tablets (160 mg total) by mouth See admin instructions. Take 160 mg by mouth in the morning and additional 160 mg once daily on 12/17/2021, 12/18/2021, and 12/19/2021. Resume regular once-daily dose of 160 mg on  12/20/2021, unless otherwise instructed by provider.  ? insulin aspart protamine- aspart (NOVOLOG MIX 70/30) (70-30) 100 UNIT/ML injection Inject 50-60 Units into the skin See admin instructions. Inject 60 units into the skin in the morning and 50 units in the evening  ? pantoprazole (PROTONIX) 40 MG tablet Take 40 mg by mouth in the morning before breakfast  ? potassium chloride SA (KLOR-CON) 20 MEQ tablet Take 20 mEq by mouth daily.  ? traMADol (ULTRAM) 50 MG tablet Take 50-100 mg by mouth daily as needed (for pain).  ?  ? ?Allergies  ?Allergen Reactions  ? Hydrocodone-Acetaminophen Other (See Comments)  ?  Unknown reaction  ? Oxycodone Hcl Other (See Comments)  ?  Unknown reaction  ? Amitriptyline Nausea Only  ? Citalopram Other (See Comments)  ?  Nightmares   ? ? ?Social History  ? ?Socioeconomic History  ? Marital status: Married  ?  Spouse name: Not on file  ? Number of children: Not on file  ? Years of education: Not on file  ? Highest education level: Not on file  ?Occupational History  ? Occupation: retired  ?Tobacco Use  ? Smoking status: Never  ? Smokeless tobacco: Never  ?Vaping Use  ? Vaping Use: Never used  ?Substance and Sexual Activity  ? Alcohol use: Yes  ?  Comment:  rare  ? Drug use: No  ? Sexual activity: Not on file  ?Other Topics Concern  ? Not on file  ?Social History Narrative  ? Not on file  ? ?Social Determinants of Health  ? ?Financial Resource Strain: Not on file  ?Food Insecurity: Not on file  ?Transportation Needs: No Transportation Needs  ? Lack of Transportation (Medical): No  ? Lack of Transportation (Non-Medical): No  ?Physical Activity: Not on file  ?Stress: Not on file  ?Social Connections: Not on file  ?Intimate Partner Violence: Not on file  ?  ? ?Review of Systems: ?General: negative for chills, fever, night sweats or weight changes.  ?Cardiovascular: negative for chest pain, dyspnea on exertion, edema, orthopnea, palpitations, paroxysmal nocturnal dyspnea or shortness of  breath ?Dermatological: negative for rash ?Respiratory: negative for cough or wheezing ?Urologic: negative for hematuria ?Abdominal: negative for nausea, vomiting, diarrhea, bright red blood per rectum, melena, or hematemesis ?Neurologic: negative for visual changes, syncope, or dizziness ?All other systems reviewed and are otherwise negative except as noted above. ? ? ? ?Blood pressure 128/66, pulse 69, height 5\' 4"  (1.626 m), weight 277 lb 9.6 oz (125.9 kg), SpO2 94 %.  ?General appearance: alert and no distress ?Neck: no adenopathy, no carotid bruit, no JVD, supple, symmetrical, trachea midline, and thyroid not enlarged, symmetric, no tenderness/mass/nodules ?Lungs: clear to auscultation bilaterally ?Heart: regular rate and rhythm, S1, S2 normal, no murmur, click, rub or gallop ?Extremities: extremities normal, atraumatic, no cyanosis or edema ?Pulses: 2+ and symmetric ?Skin: Skin color, texture, turgor normal. No rashes or lesions ?Neurologic:  Grossly normal ? ?EKG not performed today ? ?ASSESSMENT AND PLAN:  ? ?Morbid obesity ([place]) ?History of morbid obesity with a BMI of 47.  I am going to refer her to the [facility] for facilitated weight loss ? ?Essential hypertension ?History of essential hypertension with blood pressure measured today at 128/66.  She is on amlodipine, and carvedilol. ? ?Chronic diastolic CHF (congestive heart failure) ([place]) ?History of chronic diastolic heart failure with 2D echo performed 03/06/2020 revealing normal LV systolic function with grade 1 diastolic dysfunction.  She is on high-dose furosemide (160 mg p.o. twice daily) followed by nephrologist.  Her creatinine runs in the mid 3 range.  She has seen Dr. Donzetta Matters for cephalic vein mapping in preparation for an AV fistula. ? ?Hyperlipemia ?History of hyperlipidemia on statin therapy with lipid profile performed 11/13/2021 revealing total cholesterol 109, LDL 50 and HDL 38. ? ?Palpitations ?History of palpitations in the  past improved with reduction in caffeine intake and beta-blockade. ? ?Chest pain, rule out acute myocardial infarction ?History of chest pain in the past with cardiac catheterization performed 03/09/2018 which was enti

## 2022-01-22 NOTE — Assessment & Plan Note (Signed)
History of essential hypertension with blood pressure measured today at 128/66.  She is on amlodipine, and carvedilol. ?

## 2022-01-22 NOTE — Assessment & Plan Note (Signed)
History of morbid obesity with a BMI of 47.  I am going to refer her to the [facility] for facilitated weight loss ?

## 2022-01-22 NOTE — Patient Instructions (Signed)

## 2022-01-22 NOTE — Assessment & Plan Note (Signed)
History of hyperlipidemia on statin therapy with lipid profile performed 11/13/2021 revealing total cholesterol 109, LDL 50 and HDL 38. ?

## 2022-01-22 NOTE — Assessment & Plan Note (Signed)
History of palpitations in the past improved with reduction in caffeine intake and beta-blockade. ?

## 2022-01-22 NOTE — Assessment & Plan Note (Signed)
History of chronic diastolic heart failure with 2D echo performed 03/06/2020 revealing normal LV systolic function with grade 1 diastolic dysfunction.  She is on high-dose furosemide (160 mg p.o. twice daily) followed by nephrologist.  Her creatinine runs in the mid 3 range.  She has seen Dr. Donzetta Matters for cephalic vein mapping in preparation for an AV fistula. ?

## 2022-01-26 NOTE — Progress Notes (Deleted)
01/29/22- 74 yoF never smoker for sleep evaluation courtesyof Dr Shirline Frees with concern of Chronic Cough and OSA Medical problem ist includes OSA.  dCHF, HTN, Chronic Hypoxic Respiratory Failure, Covid Infection, GERD, DM 2, Vertigo, CKD4, Hyperlipidemia, Obesity,  Hosp 3/13-3/14-  Exacerb cough and wheeze, requiring O2. Possible COPD exacerb> steroids, nebs, referred here. Epworth score- Body weight today- Covid vax- Flu vax-       CXR 12/17/21- FINDINGS: Heart size and mediastinal contours are unremarkable. No pleural effusion or edema identified. No airspace opacities. The visualized osseous structures are unremarkable. IMPRESSION: No active cardiopulmonary abnormalities.

## 2022-01-29 ENCOUNTER — Institutional Professional Consult (permissible substitution): Payer: Medicare HMO | Admitting: Internal Medicine

## 2022-02-08 ENCOUNTER — Other Ambulatory Visit (HOSPITAL_COMMUNITY): Payer: Self-pay

## 2022-02-11 ENCOUNTER — Ambulatory Visit (HOSPITAL_COMMUNITY)
Admission: RE | Admit: 2022-02-11 | Discharge: 2022-02-11 | Disposition: A | Discharge status: Home / Self Care | Payer: Medicare HMO | Source: Ambulatory Visit | Attending: Internal Medicine | Admitting: Internal Medicine

## 2022-02-11 VITALS — BP 149/61 | HR 66 | Temp 97.7°F | Resp 17

## 2022-02-11 DIAGNOSIS — N184 Chronic kidney disease, stage 4 (severe): Secondary | ICD-10-CM | POA: Insufficient documentation

## 2022-02-11 DIAGNOSIS — N179 Acute kidney failure, unspecified: Secondary | ICD-10-CM | POA: Insufficient documentation

## 2022-02-11 DIAGNOSIS — N189 Chronic kidney disease, unspecified: Secondary | ICD-10-CM | POA: Insufficient documentation

## 2022-02-11 LAB — IRON AND TIBC
Iron: 50 ug/dL (ref 28–170)
Saturation Ratios: 19 % (ref 10.4–31.8)
TIBC: 270 ug/dL (ref 250–450)
UIBC: 220 ug/dL

## 2022-02-11 LAB — FERRITIN: Ferritin: 277 ng/mL (ref 11–307)

## 2022-02-11 LAB — POCT HEMOGLOBIN-HEMACUE: Hemoglobin: 11 g/dL — ABNORMAL LOW (ref 12.0–15.0)

## 2022-02-11 MED ORDER — EPOETIN ALFA-EPBX 10000 UNIT/ML IJ SOLN: 15000.0000 [IU] | INTRAMUSCULAR | Status: DC

## 2022-02-11 MED ORDER — EPOETIN ALFA-EPBX 10000 UNIT/ML IJ SOLN
INTRAMUSCULAR | Status: AC
  Administered 2022-02-11: 15000 [IU] via SUBCUTANEOUS
  Filled 2022-02-11: qty 2

## 2022-02-12 ENCOUNTER — Encounter: Payer: Self-pay | Admitting: Pulmonary Disease

## 2022-02-12 ENCOUNTER — Ambulatory Visit (INDEPENDENT_AMBULATORY_CARE_PROVIDER_SITE_OTHER): Payer: Medicare HMO | Admitting: Pulmonary Disease

## 2022-02-12 VITALS — BP 138/76 | HR 70 | Temp 97.9°F | Ht 64.0 in | Wt 277.0 lb

## 2022-02-12 DIAGNOSIS — G4719 Other hypersomnia: Secondary | ICD-10-CM | POA: Diagnosis not present

## 2022-02-12 DIAGNOSIS — R0609 Other forms of dyspnea: Secondary | ICD-10-CM | POA: Diagnosis not present

## 2022-02-12 NOTE — Progress Notes (Signed)
?       ? ?      ?Latasha [name]    [id number]    03-Jan-1956 ? ?Primary Care Physician:Harris, Gwyndolyn Saxon, MD ? ?Referring Physician: Shirline Frees, MD ?[address] ?[address] ?[place],  [place] [address] ? ?Chief complaint:   ?Shortness of breath ?Nonrestorative sleep ?Wheezing ? ?HPI: ? ?Worsening shortness of breath ?History of snoring, difficulty laying flat in bed ?Sleeps better sleeping more upright ? ?Multiple underlying medical problems including hypertension, diastolic congestive heart failure, cardiorenal syndrome, PVCs, diastolic dysfunction ?Chronically on diuretics ? ?Usually goes to bed between 9 and 11 PM, sleeps for couple of hours and then wakes up ?Takes about 30 minutes to fall asleep ?Wakes up frequently to use the bathroom ?Final wake up time between 9 AM and 10 AM ? ?Weight is up about 20 pounds recently ? ?She stated she may have had a sleep study performed about March but could not find this on record ? ?She does have significant balance issues for which she uses a walker, does get short of breath with only a few steps ? 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 ?She did not smoke in the past ?Occasional use of alcohol ? ?Outpatient Encounter Medications as of 02/12/2022  ?Medication Sig  ? acetaminophen (TYLENOL) 500 MG tablet Take 1,000 mg by mouth every 6 (six) hours as needed for mild pain, fever or headache.  ? albuterol (PROVENTIL HFA;VENTOLIN HFA) 108 (90 Base) MCG/ACT inhaler Inhale 2 puffs into the lungs every 6 (six) hours as needed for wheezing or shortness of breath.  ? amLODipine (NORVASC) 10 MG tablet Take 1 tablet (10 mg total) by mouth daily.  ? atorvastatin (LIPITOR) 40 MG tablet Take 40 mg by mouth daily.   ? Biotin w/ Vitamins C & E (HAIR SKIN & NAILS GUMMIES PO) Take 2 tablets by mouth daily.  ? budesonide-formoterol (SYMBICORT) 160-4.5 MCG/ACT inhaler Inhale 2 puffs into the lungs 2 (two) times daily.  ? carvedilol (COREG) 25 MG tablet  Take 1 tablet (25 mg total) by mouth 2 (two) times daily with a meal.  ? guaiFENesin-dextromethorphan (ROBITUSSIN DM) 100-10 MG/5ML syrup Take 10 mLs by mouth every 4 (four) hours as needed for cough.  ? insulin aspart protamine- aspart (NOVOLOG MIX 70/30) (70-30) 100 UNIT/ML injection Inject 50-60 Units into the skin See admin instructions. Inject 60 units into the skin in the morning and 50 units in the evening  ? nitroGLYCERIN (NITROSTAT) 0.4 MG SL tablet Place 1 tablet (0.4 mg total) under the tongue every 5 (five) minutes as needed for chest pain.  ? pantoprazole (PROTONIX) 40 MG tablet Take 40 mg by mouth in the morning before breakfast  ? potassium chloride SA (KLOR-CON) 20 MEQ tablet Take 20 mEq by mouth daily.  ? traMADol (ULTRAM) 50 MG tablet Take 50-100 mg by mouth daily as needed (for pain).  ? [DISCONTINUED] furosemide (LASIX) 80 MG tablet Take 2 tablets (160 mg total) by mouth See admin instructions. Take 160 mg by mouth in the morning and additional 160 mg once daily on 12/17/2021, 12/18/2021, and 12/19/2021. Resume regular once-daily dose of 160 mg on 12/20/2021, unless otherwise instructed by provider.  ? torsemide (DEMADEX) 100 MG tablet Take 100 mg by mouth every morning.  ? ?No facility-administered encounter medications on file as of 02/12/2022.  ? ? ?Allergies as of 02/12/2022 - Review Complete 02/12/2022  ?Allergen Reaction Noted  ? Hydrocodone-acetaminophen Other (  See Comments) 02/04/2020  ? Oxycodone hcl Other (See Comments) 02/04/2020  ? Amitriptyline Nausea Only 12/17/2021  ? Citalopram Other (See Comments) 03/21/2021  ? ? ?Past Medical History:  ?Diagnosis Date  ? BPPV (benign paroxysmal positional vertigo)   ? Chronic congestive heart failure with left ventricular diastolic dysfunction (HCC)   ? CKD (chronic kidney disease), stage IV ([place]) 05/15/2017  ? Diabetes mellitus   ? Hyperlipidemia   ? Hypertension   ? Left atrial enlargement   ? Morbid obesity with BMI of 40.0-44.9, adult ([place])    ? Obstructive sleep apnea 01/22/2022  ? Palpitations   ? ? ?Past Surgical History:  ?Procedure Laterality Date  ? ABDOMINAL HYSTERECTOMY    ? BIOPSY  11/09/2020  ? Procedure: BIOPSY;  Surgeon: Wilford Corner, MD;  Location: [facility];  Service: Endoscopy;;  ? ESOPHAGOGASTRODUODENOSCOPY N/A 11/09/2020  ? Procedure: ESOPHAGOGASTRODUODENOSCOPY (EGD);  Surgeon: Wilford Corner, MD;  Location: [facility];  Service: Endoscopy;  Laterality: N/A;  ? EYE SURGERY    ? cataract/left eye, right eye had bleeding in back  ? IR FLUORO GUIDE CV LINE RIGHT  10/26/2020  ? IR US GUIDE VASC ACCESS RIGHT  10/26/2020  ? KNEE SURGERY    ? LEFT HEART CATH AND CORONARY ANGIOGRAPHY N/A 03/09/2018  ? Procedure: LEFT HEART CATH AND CORONARY ANGIOGRAPHY;  Surgeon: Lorretta Harp, MD;  Location: [facility];  Service: Cardiovascular;  Laterality: N/A;  ? TRIGGER FINGER RELEASE    ? ? ?Family History  ?Problem Relation Age of Onset  ? Heart disease Father   ? Hypertension Father   ? Alzheimer's disease Mother   ? Diabetes Mother   ? Diabetes Sister   ? Dementia Other   ? ? ?Social History  ? ?Socioeconomic History  ? Marital status: Married  ?  Spouse name: Not on file  ? Number of children: Not on file  ? Years of education: Not on file  ? Highest education level: Not on file  ?Occupational History  ? Occupation: retired  ?Tobacco Use  ? Smoking status: Never  ? Smokeless tobacco: Never  ?Vaping Use  ? Vaping Use: Never used  ?Substance and Sexual Activity  ? Alcohol use: Yes  ?  Comment:  rare  ? Drug use: No  ? Sexual activity: Not on file  ?Other Topics Concern  ? Not on file  ?Social History Narrative  ? Not on file  ? ?Social Determinants of Health  ? ?Financial Resource Strain: Not on file  ?Food Insecurity: Not on file  ?Transportation Needs: No Transportation Needs  ? Lack of Transportation (Medical): No  ? Lack of Transportation (Non-Medical): No  ?Physical Activity: Not on file  ?Stress: Not on file  ?Social Connections:  Not on file  ?Intimate Partner Violence: Not on file  ? ? ?Review of Systems  ?Constitutional:  Positive for fatigue.  ?Respiratory:  Positive for shortness of breath.   ?Psychiatric/Behavioral:  Positive for sleep disturbance.   ? ?There were no vitals filed for this visit. ? ? ?Physical Exam ?Constitutional:   ?   Appearance: She is obese.  ?HENT:  ?   Head: Normocephalic.  ?   Mouth/Throat:  ?   Mouth: Mucous membranes are moist.  ?Cardiovascular:  ?   Rate and Rhythm: Normal rate and regular rhythm.  ?   Heart sounds: No murmur heard. ?  No friction rub.  ?Pulmonary:  ?   Effort: No respiratory distress.  ?   Breath  sounds: No stridor. No wheezing or rhonchi.  ?Musculoskeletal:  ?   Cervical back: Normal range of motion. No rigidity or tenderness.  ?Neurological:  ?   Mental Status: She is alert.  ?Psychiatric:     ?   Mood and Affect: Mood normal.  ? ? ?  02/12/2022  ?  9:00 AM  ?Results of the Epworth flowsheet  ?Sitting and reading 2  ?Watching TV 3  ?Sitting, inactive in a public place (e.g. a theatre or a meeting) 0  ?As a passenger in a car for an hour without a break 0  ?Lying down to rest in the afternoon when circumstances permit 3  ?Sitting and talking to someone 0  ?Sitting quietly after a lunch without alcohol 2  ?In a car, while stopped for a few minutes in traffic 0  ?Total score 10  ? ?Data Reviewed: ?No previous sleep study on record ? ?Assessment:  ?Moderate probability of significant obstructive sleep apnea ? ?With a history of cardiorenal disease, will benefit from an in lab polysomnogram ? ?Pathophysiology of sleep disordered breathing discussed with the patient ?Treatment options for sleep disordered breathing discussed with the patient ? ?Wheezing/shortness of breath ?-This may be related to decompensated heart failure ? ? ?Plan/Recommendations: ?Schedule patient for an in lab polysomnogram ? ?Continue current medications, currently on Symbicort ? ?Rescue inhaler albuterol use as  needed ? ?Schedule patient for pulmonary function test ? ?Schedule patient for an echocardiogram to assess for progression of dysfunction ? ?Graded exercises as tolerated ? ?Encouraged to call for any significant conc

## 2022-02-12 NOTE — Patient Instructions (Signed)
Schedule for in lab polysomnogram-split-night study ? ?Schedule for PFT ? ?Schedule for echocardiogram ? ?Get [place] copy of the sleep study you said you had recently-or at least information to follow this up ? ? ? ?Graded exercises as tolerated ?Increase activity as tolerated ? ?Decrease fluid intake ? ?Try and get sleep in any position that is comfortable that allows you to sleep for enough hours ? ?Continue Symbicort and albuterol as needed ? ?I will see you back in 4 to 6 weeks ? ?Call with significant concerns ? ?Living With Sleep Apnea ?Sleep apnea is a condition in which breathing pauses or becomes shallow during sleep. Sleep apnea is most commonly caused by a collapsed or blocked airway. People with sleep apnea usually snore loudly. They may have times when they gasp and stop breathing for 10 seconds or more during sleep. This may happen many times during the night. ?The breaks in breathing also interrupt the deep sleep that you need to feel rested. Even if you do not completely wake up from the gaps in breathing, your sleep may not be restful and you feel tired during the day. You may also have a headache in the morning and low energy during the day, and you may feel anxious or depressed. ?How can sleep apnea affect me? ?Sleep apnea increases your chances of extreme tiredness during the day (daytime fatigue). It can also increase your risk for health conditions, such as: ?Heart attack. ?Stroke. ?Obesity. ?Type 2 diabetes. ?Heart failure. ?Irregular heartbeat. ?High blood pressure. ?If you have daytime fatigue as a result of sleep apnea, you may be more likely to: ?Perform poorly at school or work. ?Fall asleep while driving. ?Have difficulty with attention. ?Develop depression or anxiety. ?Have sexual dysfunction. ?What actions can I take to manage sleep apnea? ?Sleep apnea treatment ? ?If you were given a device to open your airway while you sleep, use it only as told by your health care provider. You may be  given: ?An oral appliance. This is a custom-made mouthpiece that shifts your lower jaw forward. ?A continuous positive airway pressure (CPAP) device. This device blows air through a mask when you breathe out (exhale). ?A nasal expiratory positive airway pressure (EPAP) device. This device has valves that you put into each nostril. ?A bi-level positive airway pressure (BIPAP) device. This device blows air through a mask when you breathe in (inhale) and breathe out (exhale). ?You may need surgery if other treatments do not work for you. ?Sleep habits ?Go to sleep and wake up at the same time every day. This helps set your internal clock (circadian rhythm) for sleeping. ?If you stay up later than usual, such as on weekends, try to get up in the morning within 2 hours of your normal wake time. ?Try to get at least 7-9 hours of sleep each night. ?Stop using a computer, tablet, and mobile phone a few hours before bedtime. ?Do not take long naps during the day. If you nap, limit it to 30 minutes. ?Have a relaxing bedtime routine. Reading or listening to music may relax you and help you sleep. ?Use your bedroom only for sleep. ?Keep your television and computer out of your bedroom. ?Keep your bedroom cool, dark, and quiet. ?Use a supportive mattress and pillows. ?Follow your health care provider's instructions for other changes to sleep habits. ?Nutrition ?Do not eat heavy meals in the evening. ?Do not have caffeine in the later part of the day. The effects of caffeine can last for  more than 5 hours. ?Follow your health care provider's or dietitian's instructions for any diet changes. ?Lifestyle ? ?  ? ?Do not drink alcohol before bedtime. Alcohol can cause you to fall asleep at first, but then it can cause you to wake up in the middle of the night and have trouble getting back to sleep. ?Do not use any products that contain nicotine or tobacco. These products include cigarettes, chewing tobacco, and vaping devices, such as  e-cigarettes. If you need help quitting, ask your health care provider. ?Medicines ?Take over-the-counter and prescription medicines only as told by your health care provider. ?Do not use over-the-counter sleep medicine. You can become dependent on this medicine, and it can make sleep apnea worse. ?Do not use medicines, such as sedatives and narcotics, unless told by your health care provider. ?Activity ?Exercise on most days, but avoid exercising in the evening. Exercising near bedtime can interfere with sleeping. ?If possible, spend time outside every day. Natural light helps regulate your circadian rhythm. ?General information ?Lose weight if you need to, and maintain a healthy weight. ?Keep all follow-up visits. This is important. ?If you are having surgery, make sure to tell your health care provider that you have sleep apnea. You may need to bring your device with you. ?Where to find more information ?Learn more about sleep apnea and daytime fatigue from: ?American Sleep Association: sleepassociation.org ?National Sleep Foundation: sleepfoundation.org ?National Heart, Lung, and Blood Institute: https://www.hartman-hill.biz/ ?Summary ?Sleep apnea is a condition in which breathing pauses or becomes shallow during sleep. ?Sleep apnea can cause daytime fatigue and other serious health conditions. ?You may need to wear a device while sleeping to help keep your airway open. ?If you are having surgery, make sure to tell your health care provider that you have sleep apnea. You may need to bring your device with you. ?Making changes to sleep habits, diet, lifestyle, and activity can help you manage sleep apnea. ?This information is not intended to replace advice given to you by your health care provider. Make sure you discuss any questions you have with your health care provider. ?Document Revised: 05/02/2021 Document Reviewed: 09/01/2020 ?Elsevier Patient Education ? [place]. ? ? ? ? ? ?

## 2022-02-13 ENCOUNTER — Encounter: Payer: Self-pay | Admitting: Pulmonary Disease

## 2022-02-15 ENCOUNTER — Other Ambulatory Visit: Payer: Self-pay

## 2022-02-15 NOTE — Patient Outreach (Signed)
[facility] Care Management ? ?02/15/2022 ? ?Latasha [name] ?December 30, 1955 ?[id number] ? ? ?Telephone call to patient for follow up. She reports doing fairly well.  She reports she is starting to exercise as she is gaining weight. Blood sugars are good per patient.  However, she reports this morning her sugar was 156.  Encouraged to exercise and how it makes a difference in her sugars as well.  She verbalized understanding. No concerns.  ? ?Care Plan : RN CM plan of care  ?Updates made by Jon Billings, RN since 02/15/2022 12:00 AM  ?  ? ?Problem: Chronic Disease management and care coordination of diabetes   ?Priority: High  ?  ? ?Long-Range Goal: Devoelpment of plan of care for the management of diabetes   ?Start Date: 12/19/2021  ?Expected End Date: 10/06/2022  ?This Visit's Progress: On track  ?Recent Progress: On track  ?Priority: High  ?Note:   ?Current Barriers:  ?Chronic Disease Management support and education needs related to DMII  ? ?RNCM Clinical Goal(s):  ?Patient will verbalize basic understanding of  DMII disease process and self health management plan as evidenced by A1c less than 7.5 ?continue to work with RN Care Manager to address care management and care coordination needs related to  DMII as evidenced by adherence to CM Team Scheduled appointments through collaboration with RN Care manager, provider, and care team.  ? ?Interventions: ?Education and support related to diabetes ?Inter-disciplinary care team collaboration (see longitudinal plan of care) ?Evaluation of current treatment plan related to  self management and patient's adherence to plan as established by provider ? ? ?Diabetes Interventions:  (Status:  Goal on track:  Yes.) Long Term Goal ?Assessed patient's understanding of A1c goal: <7% ?Provided education to patient about basic DM disease process ?Discussed plans with patient for ongoing care management follow up and provided patient with direct contact information for  care management team ?Lab Results  ?Component Value Date  ? HGBA1C 7.7 (H) 12/18/2021  ? ? ? ?Patient Goals/Self-Care Activities: Diabetes ?Take all medications as prescribed ?Attend all scheduled provider appointments ?check blood sugar at prescribed times: twice daily ?enter blood sugar readings and medication or insulin into daily log ?take the blood sugar log to all doctor visits ?fill half of plate with vegetables ?manage portion size ? ?12/19/21 Patient on steroids due to episodic coughing.  Blood sugars running higher per patient. Last check was 390.  Encouraged patient to keep her diet tighter and really limiting carbohydrates and sweets.  ? ?02/15/22 Patient reports blood sugars are good.  Last check 156.  Discussed diabetes management, importance of diet control and exercise. ?  ?Follow Up Plan:  Telephone follow up appointment with care management team member scheduled for:  July ?The patient has been provided with contact information for the care management team and has been advised to call with any health related questions or concerns.  ? ?  ? ?Plan: Follow-up: Patient agrees to Care Plan and Follow-up. ?Follow-up in July.  ? ?Jone Baseman, RN, MSN ?[facility] Care Management ?Care Management Coordinator ?Direct Line [phone] ?Toll Free: [phone]  ?Fax: [phone] ? ?

## 2022-02-15 NOTE — Patient Instructions (Addendum)
Patient Goals/Self-Care Activities: Diabetes ?Take all medications as prescribed ?Attend all scheduled provider appointments ?check blood sugar at prescribed times: twice daily ?enter blood sugar readings and medication or insulin into daily log ?take the blood sugar log to all doctor visits ?fill half of plate with vegetables ?manage portion size ?Exercise and remain acitive ?

## 2022-02-18 ENCOUNTER — Ambulatory Visit (HOSPITAL_COMMUNITY)
Admission: RE | Admit: 2022-02-18 | Discharge: 2022-02-18 | Disposition: A | Discharge status: Home / Self Care | Payer: Medicare HMO | Source: Ambulatory Visit | Attending: Internal Medicine | Admitting: Internal Medicine

## 2022-02-18 DIAGNOSIS — N189 Chronic kidney disease, unspecified: Secondary | ICD-10-CM | POA: Insufficient documentation

## 2022-02-18 DIAGNOSIS — D631 Anemia in chronic kidney disease: Secondary | ICD-10-CM | POA: Diagnosis not present

## 2022-02-18 MED ORDER — SODIUM CHLORIDE 0.9 % IV SOLN
510.0000 mg | INTRAVENOUS | Status: DC
  Administered 2022-02-18: 510 mg via INTRAVENOUS
  Filled 2022-02-18: qty 510

## 2022-02-21 DIAGNOSIS — N2581 Secondary hyperparathyroidism of renal origin: Secondary | ICD-10-CM | POA: Diagnosis not present

## 2022-02-21 DIAGNOSIS — I120 Hypertensive chronic kidney disease with stage 5 chronic kidney disease or end stage renal disease: Secondary | ICD-10-CM | POA: Diagnosis not present

## 2022-02-21 DIAGNOSIS — Z6841 Body Mass Index (BMI) 40.0 and over, adult: Secondary | ICD-10-CM | POA: Diagnosis not present

## 2022-02-21 DIAGNOSIS — E1122 Type 2 diabetes mellitus with diabetic chronic kidney disease: Secondary | ICD-10-CM | POA: Diagnosis not present

## 2022-02-21 DIAGNOSIS — N185 Chronic kidney disease, stage 5: Secondary | ICD-10-CM | POA: Diagnosis not present

## 2022-02-21 DIAGNOSIS — G629 Polyneuropathy, unspecified: Secondary | ICD-10-CM | POA: Diagnosis not present

## 2022-02-21 DIAGNOSIS — I5030 Unspecified diastolic (congestive) heart failure: Secondary | ICD-10-CM | POA: Diagnosis not present

## 2022-02-21 DIAGNOSIS — R809 Proteinuria, unspecified: Secondary | ICD-10-CM | POA: Diagnosis not present

## 2022-02-21 DIAGNOSIS — D649 Anemia, unspecified: Secondary | ICD-10-CM | POA: Diagnosis not present

## 2022-02-22 ENCOUNTER — Telehealth (HOSPITAL_COMMUNITY): Payer: Self-pay | Admitting: Pulmonary Disease

## 2022-02-22 ENCOUNTER — Telehealth: Payer: Self-pay | Admitting: Pulmonary Disease

## 2022-02-22 NOTE — Telephone Encounter (Signed)
Patient declined to schedule echocardiogram due to she states she just saw cardiologist and he states everything good.  Order will be removed from [facility]

## 2022-02-22 NOTE — Telephone Encounter (Signed)
Last echo was 2 years ago.  Was not as short of breath, not able to lay flat in bed, wheezing  Need to make sure diastolic dysfunction is not worsening and contributing to pulmonary hypertension which will be a possibility

## 2022-02-25 NOTE — Telephone Encounter (Signed)
Called patient and informed her that Dr Jenetta Downer placed a new order for an echocardiogram and that he wants it done next month. Advised patient that we need to make sure her diastolic dysfunction is not getting worse. Nothing further needed

## 2022-02-28 ENCOUNTER — Encounter (HOSPITAL_COMMUNITY): Payer: Self-pay

## 2022-03-11 ENCOUNTER — Ambulatory Visit (HOSPITAL_COMMUNITY)
Admission: RE | Admit: 2022-03-11 | Discharge: 2022-03-11 | Disposition: A | Discharge status: Home / Self Care | Payer: Medicare HMO | Source: Ambulatory Visit | Attending: Internal Medicine | Admitting: Internal Medicine

## 2022-03-11 VITALS — BP 152/72 | HR 64 | Temp 98.1°F | Resp 18

## 2022-03-11 DIAGNOSIS — N184 Chronic kidney disease, stage 4 (severe): Secondary | ICD-10-CM | POA: Insufficient documentation

## 2022-03-11 DIAGNOSIS — N179 Acute kidney failure, unspecified: Secondary | ICD-10-CM | POA: Diagnosis not present

## 2022-03-11 DIAGNOSIS — N189 Chronic kidney disease, unspecified: Secondary | ICD-10-CM | POA: Insufficient documentation

## 2022-03-11 LAB — POCT HEMOGLOBIN-HEMACUE: Hemoglobin: 11.6 g/dL — ABNORMAL LOW (ref 12.0–15.0)

## 2022-03-11 LAB — RENAL FUNCTION PANEL
Albumin: 3.7 g/dL (ref 3.5–5.0)
Anion gap: 10 (ref 5–15)
BUN: 129 mg/dL — ABNORMAL HIGH (ref 8–23)
CO2: 26 mmol/L (ref 22–32)
Calcium: 8.5 mg/dL — ABNORMAL LOW (ref 8.9–10.3)
Chloride: 104 mmol/L (ref 98–111)
Creatinine, Ser: 4.11 mg/dL — ABNORMAL HIGH (ref 0.44–1.00)
GFR, Estimated: 11 mL/min — ABNORMAL LOW (ref >60)
Glucose, Bld: 133 mg/dL — ABNORMAL HIGH (ref 70–99)
Phosphorus: 6 mg/dL — ABNORMAL HIGH (ref 2.5–4.6)
Potassium: 4.9 mmol/L (ref 3.5–5.1)
Sodium: 140 mmol/L (ref 135–145)

## 2022-03-11 MED ORDER — EPOETIN ALFA-EPBX 10000 UNIT/ML IJ SOLN: 15000.0000 [IU] | INTRAMUSCULAR | Status: DC

## 2022-03-11 MED ORDER — SODIUM CHLORIDE 0.9 % IV SOLN
510.0000 mg | INTRAVENOUS | Status: DC
  Administered 2022-03-11: 510 mg via INTRAVENOUS
  Filled 2022-03-11: qty 510

## 2022-03-11 MED ORDER — EPOETIN ALFA-EPBX 10000 UNIT/ML IJ SOLN
INTRAMUSCULAR | Status: AC
  Administered 2022-03-11: 15000 [IU] via SUBCUTANEOUS
  Filled 2022-03-11: qty 2

## 2022-03-12 LAB — PTH, INTACT AND CALCIUM
Calcium, Total (PTH): 8.4 mg/dL — ABNORMAL LOW (ref 8.7–10.3)
PTH: 221 pg/mL — ABNORMAL HIGH (ref 15–65)

## 2022-03-18 ENCOUNTER — Ambulatory Visit (HOSPITAL_COMMUNITY): Payer: Medicare HMO | Attending: Pulmonary Disease

## 2022-03-18 DIAGNOSIS — R0609 Other forms of dyspnea: Secondary | ICD-10-CM | POA: Diagnosis not present

## 2022-03-18 LAB — ECHOCARDIOGRAM COMPLETE
Area-P 1/2: 2.96 cm2
S' Lateral: 3.1 cm

## 2022-03-19 NOTE — Progress Notes (Signed)
Echocardiogram is within normal limits  No significant abnormality

## 2022-03-21 DIAGNOSIS — N2581 Secondary hyperparathyroidism of renal origin: Secondary | ICD-10-CM | POA: Diagnosis not present

## 2022-03-21 DIAGNOSIS — D649 Anemia, unspecified: Secondary | ICD-10-CM | POA: Diagnosis not present

## 2022-03-21 DIAGNOSIS — I5030 Unspecified diastolic (congestive) heart failure: Secondary | ICD-10-CM | POA: Diagnosis not present

## 2022-03-21 DIAGNOSIS — E113393 Type 2 diabetes mellitus with moderate nonproliferative diabetic retinopathy without macular edema, bilateral: Secondary | ICD-10-CM | POA: Diagnosis not present

## 2022-03-21 DIAGNOSIS — N186 End stage renal disease: Secondary | ICD-10-CM | POA: Diagnosis not present

## 2022-03-21 DIAGNOSIS — R809 Proteinuria, unspecified: Secondary | ICD-10-CM | POA: Diagnosis not present

## 2022-03-21 DIAGNOSIS — E1122 Type 2 diabetes mellitus with diabetic chronic kidney disease: Secondary | ICD-10-CM | POA: Diagnosis not present

## 2022-03-21 DIAGNOSIS — G629 Polyneuropathy, unspecified: Secondary | ICD-10-CM | POA: Diagnosis not present

## 2022-03-21 DIAGNOSIS — I120 Hypertensive chronic kidney disease with stage 5 chronic kidney disease or end stage renal disease: Secondary | ICD-10-CM | POA: Diagnosis not present

## 2022-03-21 DIAGNOSIS — Z6841 Body Mass Index (BMI) 40.0 and over, adult: Secondary | ICD-10-CM | POA: Diagnosis not present

## 2022-03-26 ENCOUNTER — Ambulatory Visit: Payer: Medicare HMO | Admitting: Pulmonary Disease

## 2022-04-02 ENCOUNTER — Ambulatory Visit (INDEPENDENT_AMBULATORY_CARE_PROVIDER_SITE_OTHER): Payer: Medicare HMO | Admitting: Pulmonary Disease

## 2022-04-02 ENCOUNTER — Encounter: Payer: Self-pay | Admitting: Pulmonary Disease

## 2022-04-02 ENCOUNTER — Ambulatory Visit: Payer: Medicare HMO | Admitting: Pulmonary Disease

## 2022-04-02 VITALS — BP 126/82 | HR 68 | Ht 64.0 in | Wt 245.2 lb

## 2022-04-02 DIAGNOSIS — R0602 Shortness of breath: Secondary | ICD-10-CM

## 2022-04-02 DIAGNOSIS — R0609 Other forms of dyspnea: Secondary | ICD-10-CM | POA: Diagnosis not present

## 2022-04-02 LAB — PULMONARY FUNCTION TEST
DL/VA % pred: 94 %
DL/VA: 3.96 ml/min/mmHg/L
DLCO cor % pred: 69 %
DLCO cor: 13.85 ml/min/mmHg
DLCO unc % pred: 69 %
DLCO unc: 13.85 ml/min/mmHg
FEF 25-75 Post: 2.62 L/sec
FEF 25-75 Pre: 2.42 L/sec
FEF2575-%Change-Post: 8 %
FEF2575-%Pred-Post: 122 %
FEF2575-%Pred-Pre: 113 %
FEV1-%Change-Post: 5 %
FEV1-%Pred-Post: 64 %
FEV1-%Pred-Pre: 61 %
FEV1-Post: 1.56 L
FEV1-Pre: 1.49 L
FEV1FVC-%Change-Post: 3 %
FEV1FVC-%Pred-Pre: 115 %
FEV6-%Change-Post: 1 %
FEV6-%Pred-Post: 55 %
FEV6-%Pred-Pre: 54 %
FEV6-Post: 1.69 L
FEV6-Pre: 1.67 L
FEV6FVC-%Pred-Post: 104 %
FEV6FVC-%Pred-Pre: 104 %
FVC-%Change-Post: 1 %
FVC-%Pred-Post: 53 %
FVC-%Pred-Pre: 52 %
FVC-Post: 1.69 L
FVC-Pre: 1.67 L
Post FEV1/FVC ratio: 93 %
Post FEV6/FVC ratio: 100 %
Pre FEV1/FVC ratio: 89 %
Pre FEV6/FVC Ratio: 100 %
RV % pred: 85 %
RV: 1.79 L
TLC % pred: 75 %
TLC: 3.83 L

## 2022-04-05 ENCOUNTER — Encounter (HOSPITAL_BASED_OUTPATIENT_CLINIC_OR_DEPARTMENT_OTHER): Payer: Medicare HMO | Admitting: Pulmonary Disease

## 2022-04-08 ENCOUNTER — Ambulatory Visit (HOSPITAL_COMMUNITY)
Admission: RE | Admit: 2022-04-08 | Discharge: 2022-04-08 | Disposition: A | Discharge status: Home / Self Care | Payer: Medicare HMO | Source: Ambulatory Visit | Attending: Internal Medicine | Admitting: Internal Medicine

## 2022-04-08 VITALS — BP 134/62 | HR 66 | Temp 97.4°F | Resp 16

## 2022-04-08 DIAGNOSIS — N184 Chronic kidney disease, stage 4 (severe): Secondary | ICD-10-CM | POA: Insufficient documentation

## 2022-04-08 DIAGNOSIS — N179 Acute kidney failure, unspecified: Secondary | ICD-10-CM | POA: Insufficient documentation

## 2022-04-08 DIAGNOSIS — N189 Chronic kidney disease, unspecified: Secondary | ICD-10-CM | POA: Diagnosis not present

## 2022-04-08 LAB — IRON AND TIBC
Iron: 59 ug/dL (ref 28–170)
Saturation Ratios: 23 % (ref 10.4–31.8)
TIBC: 258 ug/dL (ref 250–450)
UIBC: 199 ug/dL

## 2022-04-08 LAB — FERRITIN: Ferritin: 603 ng/mL — ABNORMAL HIGH (ref 11–307)

## 2022-04-08 MED ORDER — EPOETIN ALFA-EPBX 10000 UNIT/ML IJ SOLN: 15000.0000 [IU] | INTRAMUSCULAR | Status: DC

## 2022-04-10 LAB — POCT HEMOGLOBIN-HEMACUE: Hemoglobin: 12.2 g/dL (ref 12.0–15.0)

## 2022-04-11 ENCOUNTER — Other Ambulatory Visit: Payer: Self-pay

## 2022-04-11 NOTE — Patient Outreach (Signed)
[facility]) Care Management  04/11/2022  Latasha [name] 1956-02-17 [id number]   Patient reports blood sugars are up and that her kidney function has declined more and will be seeing vascular physician for peritioneal catheter.  Discussed diabetes and peritoneal dialysis.  No concerns.   Care Plan : RN CM plan of care  Updates made by Jon Billings, RN since 04/11/2022 12:00 AM     Problem: Chronic Disease management and care coordination of diabetes   Priority: High     Long-Range Goal: Devoelpment of plan of care for the management of diabetes   Start Date: 12/19/2021  Expected End Date: 10/06/2022  This Visit's Progress: Not on track  Recent Progress: On track  Priority: High  Note:   Current Barriers:  Chronic Disease Management support and education needs related to DMII   RNCM Clinical Goal(s):  Patient will verbalize basic understanding of  DMII disease process and self health management plan as evidenced by A1c less than 7.5 continue to work with RN Care Manager to address care management and care coordination needs related to  DMII as evidenced by adherence to CM Team Scheduled appointments through collaboration with RN Care manager, provider, and care team.   Interventions: Education and support related to diabetes Inter-disciplinary care team collaboration (see longitudinal plan of care) Evaluation of current treatment plan related to  self management and patient's adherence to plan as established by provider   Diabetes Interventions:  (Status:  Goal on track:  Yes.) Long Term Goal Assessed patient's understanding of A1c goal: <7% Provided education to patient about basic DM disease process Discussed plans with patient for ongoing care management follow up and provided patient with direct contact information for care management team Lab Results  Component Value Date   HGBA1C 7.7 (H) 12/18/2021     Patient Goals/Self-Care Activities:  Diabetes Take all medications as prescribed Attend all scheduled provider appointments check blood sugar at prescribed times: twice daily enter blood sugar readings and medication or insulin into daily log take the blood sugar log to all doctor visits fill half of plate with vegetables manage portion size  12/19/21 Patient on steroids due to episodic coughing.  Blood sugars running higher per patient. Last check was 390.  Encouraged patient to keep her diet tighter and really limiting carbohydrates and sweets.   02/15/22 Patient reports blood sugars are good.  Last check 156.  Discussed diabetes management, importance of diet control and exercise.  04/11/22 Patient reports her sugars are up and that her kidney function has declined and that she will be starting peritoneal dialysis. She will be seeing the vascular MD this month and proceed with catheter placement.  Discussed peritoneal dialysis process.  Patient feels some better about it.  Patient reports she is not quite sure why her blood sugars are up.  Encouraged diabetes management.     Follow Up Plan:  Telephone follow up appointment with care management team member scheduled for:  August The patient has been provided with contact information for the care management team and has been advised to call with any health related questions or concerns.      Plan: Follow-up: Patient agrees to Care Plan and Follow-up. Follow-up in August.   Neesa Knapik J Adalee Kathan, RN, MSN [place] [facility] Care Management Coordinator Direct Line [phone] Toll Free: [phone]  Fax: [phone]

## 2022-04-11 NOTE — Patient Instructions (Signed)
Patient Goals/Self-Care Activities: Diabetes Take all medications as prescribed Attend all scheduled provider appointments check blood sugar at prescribed times: twice daily enter blood sugar readings and medication or insulin into daily log take the blood sugar log to all doctor visits fill half of plate with vegetables manage portion size

## 2022-04-16 DIAGNOSIS — E113513 Type 2 diabetes mellitus with proliferative diabetic retinopathy with macular edema, bilateral: Secondary | ICD-10-CM | POA: Diagnosis not present

## 2022-04-16 DIAGNOSIS — H4312 Vitreous hemorrhage, left eye: Secondary | ICD-10-CM | POA: Diagnosis not present

## 2022-04-16 DIAGNOSIS — H35372 Puckering of macula, left eye: Secondary | ICD-10-CM | POA: Diagnosis not present

## 2022-04-16 DIAGNOSIS — H3582 Retinal ischemia: Secondary | ICD-10-CM | POA: Diagnosis not present

## 2022-04-24 ENCOUNTER — Other Ambulatory Visit: Payer: Self-pay

## 2022-04-24 ENCOUNTER — Encounter: Payer: Self-pay | Admitting: Vascular Surgery

## 2022-04-24 ENCOUNTER — Ambulatory Visit: Payer: Medicare HMO | Admitting: Vascular Surgery

## 2022-04-24 VITALS — BP 150/76 | HR 64 | Temp 98.0°F | Resp 20 | Ht 64.0 in | Wt 264.0 lb

## 2022-04-24 DIAGNOSIS — N184 Chronic kidney disease, stage 4 (severe): Secondary | ICD-10-CM

## 2022-04-24 NOTE — H&P (View-Only) (Signed)
Patient ID: Latasha [name], female   DOB: [birth year], 66 y.o.   MRN: [id number]  Reason for Consult: Follow-up   Referred by Shirline Frees, MD  Subjective:     HPI:  Latasha [name] is a 66 y.o. female with history of chronic kidney disease now stage V with plan for previous cephalic or basilic vein fistula in the right upper extremity but patient was reluctant to proceed.  She is now interested in peritoneal dialysis for which she presents for discussion today.  She does have a history of transabdominal hysterectomy no other abdominal surgeries.  Recently she has complaint of weight loss due to nausea and also fatigue but denies any swelling of her legs or shortness of breath.  Past Medical History:  Diagnosis Date   BPPV (benign paroxysmal positional vertigo)    Chronic congestive heart failure with left ventricular diastolic dysfunction (HCC)    CKD (chronic kidney disease), stage IV ([place]) 05/15/2017   Diabetes mellitus    Hyperlipidemia    Hypertension    Left atrial enlargement    Morbid obesity with BMI of 40.0-44.9, adult ([place])    Obstructive sleep apnea 01/22/2022   Palpitations    Family History  Problem Relation Age of Onset   Heart disease Father    Hypertension Father    Alzheimer's disease Mother    Diabetes Mother    Diabetes Sister    Dementia Other    Past Surgical History:  Procedure Laterality Date   ABDOMINAL HYSTERECTOMY     BIOPSY  11/09/2020   Procedure: BIOPSY;  Surgeon: Wilford Corner, MD;  Location: [facility];  Service: Endoscopy;;   ESOPHAGOGASTRODUODENOSCOPY N/A 11/09/2020   Procedure: ESOPHAGOGASTRODUODENOSCOPY (EGD);  Surgeon: Wilford Corner, MD;  Location: [facility];  Service: Endoscopy;  Laterality: N/A;   EYE SURGERY     cataract/left eye, right eye had bleeding in back   IR FLUORO GUIDE CV LINE RIGHT  10/26/2020   IR US GUIDE VASC ACCESS RIGHT  10/26/2020   KNEE SURGERY     LEFT HEART CATH AND CORONARY ANGIOGRAPHY N/A 03/09/2018    Procedure: LEFT HEART CATH AND CORONARY ANGIOGRAPHY;  Surgeon: Lorretta Harp, MD;  Location: [place] [facility];  Service: Cardiovascular;  Laterality: N/A;   TRIGGER FINGER RELEASE      Short Social History:  Social History   Tobacco Use   Smoking status: Never   Smokeless tobacco: Never  Substance Use Topics   Alcohol use: Yes    Comment:  rare    Allergies  Allergen Reactions   Hydrocodone-Acetaminophen Other (See Comments)    Unknown reaction   Oxycodone Hcl Other (See Comments)    Unknown reaction   Amitriptyline Nausea Only   Citalopram Other (See Comments)    Nightmares     Current Outpatient Medications  Medication Sig Dispense Refill   acetaminophen (TYLENOL) 500 MG tablet Take 1,000 mg by mouth every 6 (six) hours as needed for mild pain, fever or headache.     albuterol (PROVENTIL HFA;VENTOLIN HFA) 108 (90 Base) MCG/ACT inhaler Inhale 2 puffs into the lungs every 6 (six) hours as needed for wheezing or shortness of breath.     amLODipine (NORVASC) 10 MG tablet Take 1 tablet (10 mg total) by mouth daily. 30 tablet 0   atorvastatin (LIPITOR) 40 MG tablet Take 40 mg by mouth daily.      Biotin w/ Vitamins C & E (HAIR SKIN & NAILS GUMMIES PO) Take 2 tablets by  mouth daily.     budesonide-formoterol (SYMBICORT) 160-4.5 MCG/ACT inhaler Inhale 2 puffs into the lungs 2 (two) times daily. 1 each 0   carvedilol (COREG) 25 MG tablet Take 1 tablet (25 mg total) by mouth 2 (two) times daily with a meal. 60 tablet 0   guaiFENesin-dextromethorphan (ROBITUSSIN DM) 100-10 MG/5ML syrup Take 10 mLs by mouth every 4 (four) hours as needed for cough. 236 mL 0   insulin aspart protamine- aspart (NOVOLOG MIX 70/30) (70-30) 100 UNIT/ML injection Inject 50-60 Units into the skin See admin instructions. Inject 60 units into the skin in the morning and 50 units in the evening     metolazone (ZAROXOLYN) 2.5 MG tablet Take 2.5 mg by mouth daily as needed.     nitroGLYCERIN (NITROSTAT) 0.4 MG  SL tablet Place 1 tablet (0.4 mg total) under the tongue every 5 (five) minutes as needed for chest pain. 30 tablet 0   pantoprazole (PROTONIX) 40 MG tablet Take 40 mg by mouth in the morning before breakfast     potassium chloride SA (KLOR-CON) 20 MEQ tablet Take 20 mEq by mouth daily.     tiZANidine (ZANAFLEX) 2 MG tablet Take 2 mg by mouth 3 (three) times daily.     torsemide (DEMADEX) 100 MG tablet Take 100 mg by mouth every morning.     traMADol (ULTRAM) 50 MG tablet Take 50-100 mg by mouth daily as needed (for pain).     No current facility-administered medications for this visit.    Review of Systems  Constitutional: Positive for unexpected weight change.  HENT: HENT negative.  Eyes: Eyes negative.  Respiratory: Respiratory negative.  Cardiovascular: Cardiovascular negative.  GI: Positive for nausea.  Musculoskeletal: Musculoskeletal negative.  Skin: Skin negative.  Neurological: Neurological negative. Hematologic: Hematologic/lymphatic negative.  Psychiatric: Psychiatric negative.        Objective:  Objective   Vitals:   04/24/22 1353  BP: (!) 150/76  Pulse: 64  Resp: 20  Temp: 98 F (36.7 C)  SpO2: 90%  Weight: 264 lb (119.7 kg)  Height: 5\' 4"  (1.626 m)   Body mass index is 45.32 kg/m.  Physical Exam Constitutional:      Appearance: She is obese.  HENT:     Head: Normocephalic.     Nose: Nose normal.  Eyes:     Pupils: Pupils are equal, round, and reactive to light.  Cardiovascular:     Rate and Rhythm: Normal rate.     Pulses: Normal pulses.  Abdominal:     General: Abdomen is flat.     Palpations: Abdomen is soft.  Musculoskeletal:     Cervical back: Normal range of motion.     Right lower leg: No edema.     Left lower leg: No edema.  Skin:    General: Skin is warm and dry.     Capillary Refill: Capillary refill takes less than 2 seconds.  Neurological:     General: No focal deficit present.     Mental Status: She is alert.  Psychiatric:         Mood and Affect: Mood normal.        Behavior: Behavior normal.        Thought Content: Thought content normal.        Judgment: Judgment normal.     Data: Previous vein mapping again reviewed     Assessment/Plan:    66 year old female with advanced chronic kidney disease now considering peritoneal dialysis.  Discussed with her the  risk being infection and primary nonfunction particularly given her size and previous hysterectomy.  Patient wants to proceed with placement of peritoneal dialysis catheter and we will get her scheduled in the near future with hopes for avoiding need for dialysis prior to this.  I discussed with her that I am not available for 2 to 3 weeks for the procedure and that if her symptoms worsen she should call and I can have one of my partners placed the PD catheter as an outpatient.  She demonstrates good understanding in the presence of her daughter.     Waynetta Sandy MD [facility]

## 2022-04-24 NOTE — Progress Notes (Signed)
Patient ID: Latasha [name], female   DOB: 03/18/1956, 66 y.o.   MRN: [id number]  Reason for Consult: Follow-up   Referred by Shirline Frees, MD  Subjective:     HPI:  Latasha [name] is a 66 y.o. female with history of chronic kidney disease now stage V with plan for previous cephalic or basilic vein fistula in the right upper extremity but patient was reluctant to proceed.  She is now interested in peritoneal dialysis for which she presents for discussion today.  She does have a history of transabdominal hysterectomy no other abdominal surgeries.  Recently she has complaint of weight loss due to nausea and also fatigue but denies any swelling of her legs or shortness of breath.  Past Medical History:  Diagnosis Date   BPPV (benign paroxysmal positional vertigo)    Chronic congestive heart failure with left ventricular diastolic dysfunction (HCC)    CKD (chronic kidney disease), stage IV ([place]) 05/15/2017   Diabetes mellitus    Hyperlipidemia    Hypertension    Left atrial enlargement    Morbid obesity with BMI of 40.0-44.9, adult ([place])    Obstructive sleep apnea 01/22/2022   Palpitations    Family History  Problem Relation Age of Onset   Heart disease Father    Hypertension Father    Alzheimer's disease Mother    Diabetes Mother    Diabetes Sister    Dementia Other    Past Surgical History:  Procedure Laterality Date   ABDOMINAL HYSTERECTOMY     BIOPSY  11/09/2020   Procedure: BIOPSY;  Surgeon: Wilford Corner, MD;  Location: [facility];  Service: Endoscopy;;   ESOPHAGOGASTRODUODENOSCOPY N/A 11/09/2020   Procedure: ESOPHAGOGASTRODUODENOSCOPY (EGD);  Surgeon: Wilford Corner, MD;  Location: [facility];  Service: Endoscopy;  Laterality: N/A;   EYE SURGERY     cataract/left eye, right eye had bleeding in back   IR FLUORO GUIDE CV LINE RIGHT  10/26/2020   IR US GUIDE VASC ACCESS RIGHT  10/26/2020   KNEE SURGERY     LEFT HEART CATH AND CORONARY ANGIOGRAPHY N/A 03/09/2018    Procedure: LEFT HEART CATH AND CORONARY ANGIOGRAPHY;  Surgeon: Lorretta Harp, MD;  Location: [facility];  Service: Cardiovascular;  Laterality: N/A;   TRIGGER FINGER RELEASE      Short Social History:  Social History   Tobacco Use   Smoking status: Never   Smokeless tobacco: Never  Substance Use Topics   Alcohol use: Yes    Comment:  rare    Allergies  Allergen Reactions   Hydrocodone-Acetaminophen Other (See Comments)    Unknown reaction   Oxycodone Hcl Other (See Comments)    Unknown reaction   Amitriptyline Nausea Only   Citalopram Other (See Comments)    Nightmares     Current Outpatient Medications  Medication Sig Dispense Refill   acetaminophen (TYLENOL) 500 MG tablet Take 1,000 mg by mouth every 6 (six) hours as needed for mild pain, fever or headache.     albuterol (PROVENTIL HFA;VENTOLIN HFA) 108 (90 Base) MCG/ACT inhaler Inhale 2 puffs into the lungs every 6 (six) hours as needed for wheezing or shortness of breath.     amLODipine (NORVASC) 10 MG tablet Take 1 tablet (10 mg total) by mouth daily. 30 tablet 0   atorvastatin (LIPITOR) 40 MG tablet Take 40 mg by mouth daily.      Biotin w/ Vitamins C & E (HAIR SKIN & NAILS GUMMIES PO) Take 2 tablets by  mouth daily.     budesonide-formoterol (SYMBICORT) 160-4.5 MCG/ACT inhaler Inhale 2 puffs into the lungs 2 (two) times daily. 1 each 0   carvedilol (COREG) 25 MG tablet Take 1 tablet (25 mg total) by mouth 2 (two) times daily with a meal. 60 tablet 0   guaiFENesin-dextromethorphan (ROBITUSSIN DM) 100-10 MG/5ML syrup Take 10 mLs by mouth every 4 (four) hours as needed for cough. 236 mL 0   insulin aspart protamine- aspart (NOVOLOG MIX 70/30) (70-30) 100 UNIT/ML injection Inject 50-60 Units into the skin See admin instructions. Inject 60 units into the skin in the morning and 50 units in the evening     metolazone (ZAROXOLYN) 2.5 MG tablet Take 2.5 mg by mouth daily as needed.     nitroGLYCERIN (NITROSTAT) 0.4 MG  SL tablet Place 1 tablet (0.4 mg total) under the tongue every 5 (five) minutes as needed for chest pain. 30 tablet 0   pantoprazole (PROTONIX) 40 MG tablet Take 40 mg by mouth in the morning before breakfast     potassium chloride SA (KLOR-CON) 20 MEQ tablet Take 20 mEq by mouth daily.     tiZANidine (ZANAFLEX) 2 MG tablet Take 2 mg by mouth 3 (three) times daily.     torsemide (DEMADEX) 100 MG tablet Take 100 mg by mouth every morning.     traMADol (ULTRAM) 50 MG tablet Take 50-100 mg by mouth daily as needed (for pain).     No current facility-administered medications for this visit.    Review of Systems  Constitutional: Positive for unexpected weight change.  HENT: HENT negative.  Eyes: Eyes negative.  Respiratory: Respiratory negative.  Cardiovascular: Cardiovascular negative.  GI: Positive for nausea.  Musculoskeletal: Musculoskeletal negative.  Skin: Skin negative.  Neurological: Neurological negative. Hematologic: Hematologic/lymphatic negative.  Psychiatric: Psychiatric negative.        Objective:  Objective   Vitals:   04/24/22 1353  BP: (!) 150/76  Pulse: 64  Resp: 20  Temp: 98 F (36.7 C)  SpO2: 90%  Weight: 264 lb (119.7 kg)  Height: 5\' 4"  (1.626 m)   Body mass index is 45.32 kg/m.  Physical Exam Constitutional:      Appearance: She is obese.  HENT:     Head: Normocephalic.     Nose: Nose normal.  Eyes:     Pupils: Pupils are equal, round, and reactive to light.  Cardiovascular:     Rate and Rhythm: Normal rate.     Pulses: Normal pulses.  Abdominal:     General: Abdomen is flat.     Palpations: Abdomen is soft.  Musculoskeletal:     Cervical back: Normal range of motion.     Right lower leg: No edema.     Left lower leg: No edema.  Skin:    General: Skin is warm and dry.     Capillary Refill: Capillary refill takes less than 2 seconds.  Neurological:     General: No focal deficit present.     Mental Status: She is alert.  Psychiatric:         Mood and Affect: Mood normal.        Behavior: Behavior normal.        Thought Content: Thought content normal.        Judgment: Judgment normal.     Data: Previous vein mapping again reviewed     Assessment/Plan:    66 year old female with advanced chronic kidney disease now considering peritoneal dialysis.  Discussed with her the  risk being infection and primary nonfunction particularly given her size and previous hysterectomy.  Patient wants to proceed with placement of peritoneal dialysis catheter and we will get her scheduled in the near future with hopes for avoiding need for dialysis prior to this.  I discussed with her that I am not available for 2 to 3 weeks for the procedure and that if her symptoms worsen she should call and I can have one of my partners placed the PD catheter as an outpatient.  She demonstrates good understanding in the presence of her daughter.     Waynetta Sandy MD [facility]

## 2022-04-26 ENCOUNTER — Ambulatory Visit (HOSPITAL_COMMUNITY)
Admission: RE | Admit: 2022-04-26 | Discharge: 2022-04-26 | Disposition: A | Discharge status: Home / Self Care | Payer: Medicare HMO | Source: Ambulatory Visit | Attending: Internal Medicine | Admitting: Internal Medicine

## 2022-04-26 VITALS — BP 124/58 | HR 63 | Temp 97.3°F | Resp 20

## 2022-04-26 DIAGNOSIS — N184 Chronic kidney disease, stage 4 (severe): Secondary | ICD-10-CM

## 2022-04-26 DIAGNOSIS — N179 Acute kidney failure, unspecified: Secondary | ICD-10-CM | POA: Insufficient documentation

## 2022-04-26 DIAGNOSIS — N189 Chronic kidney disease, unspecified: Secondary | ICD-10-CM

## 2022-04-26 LAB — POCT HEMOGLOBIN-HEMACUE: Hemoglobin: 12.5 g/dL (ref 12.0–15.0)

## 2022-04-26 MED ORDER — EPOETIN ALFA-EPBX 10000 UNIT/ML IJ SOLN
10000.0000 [IU] | INTRAMUSCULAR | Status: DC
  Filled 2022-04-26: qty 1

## 2022-04-26 MED ORDER — EPOETIN ALFA 10000 UNIT/ML IJ SOLN: 10000.0000 [IU] | Freq: Once | INTRAMUSCULAR | Status: DC

## 2022-04-26 MED ORDER — EPOETIN ALFA 10000 UNIT/ML IJ SOLN
INTRAMUSCULAR | Status: AC
  Filled 2022-04-26: qty 1

## 2022-05-04 ENCOUNTER — Emergency Department (HOSPITAL_COMMUNITY)
Admission: EM | Admit: 2022-05-04 | Discharge: 2022-05-04 | Disposition: A | Discharge status: Home / Self Care | Payer: Medicare HMO | Attending: Emergency Medicine | Admitting: Emergency Medicine

## 2022-05-04 ENCOUNTER — Encounter (HOSPITAL_COMMUNITY): Payer: Self-pay | Admitting: Emergency Medicine

## 2022-05-04 ENCOUNTER — Emergency Department (HOSPITAL_COMMUNITY): Payer: Medicare HMO

## 2022-05-04 DIAGNOSIS — R1084 Generalized abdominal pain: Secondary | ICD-10-CM | POA: Diagnosis not present

## 2022-05-04 DIAGNOSIS — R739 Hyperglycemia, unspecified: Secondary | ICD-10-CM

## 2022-05-04 DIAGNOSIS — N184 Chronic kidney disease, stage 4 (severe): Secondary | ICD-10-CM | POA: Insufficient documentation

## 2022-05-04 DIAGNOSIS — K5900 Constipation, unspecified: Secondary | ICD-10-CM | POA: Diagnosis not present

## 2022-05-04 DIAGNOSIS — R112 Nausea with vomiting, unspecified: Secondary | ICD-10-CM

## 2022-05-04 DIAGNOSIS — K5790 Diverticulosis of intestine, part unspecified, without perforation or abscess without bleeding: Secondary | ICD-10-CM | POA: Diagnosis not present

## 2022-05-04 DIAGNOSIS — I5033 Acute on chronic diastolic (congestive) heart failure: Secondary | ICD-10-CM | POA: Diagnosis not present

## 2022-05-04 DIAGNOSIS — I130 Hypertensive heart and chronic kidney disease with heart failure and stage 1 through stage 4 chronic kidney disease, or unspecified chronic kidney disease: Secondary | ICD-10-CM | POA: Diagnosis not present

## 2022-05-04 DIAGNOSIS — E1122 Type 2 diabetes mellitus with diabetic chronic kidney disease: Secondary | ICD-10-CM | POA: Insufficient documentation

## 2022-05-04 DIAGNOSIS — K8689 Other specified diseases of pancreas: Secondary | ICD-10-CM | POA: Diagnosis not present

## 2022-05-04 LAB — URINALYSIS, ROUTINE W REFLEX MICROSCOPIC
Bilirubin Urine: NEGATIVE
Glucose, UA: 150 mg/dL — AB
Hgb urine dipstick: NEGATIVE
Ketones, ur: NEGATIVE mg/dL
Leukocytes,Ua: NEGATIVE
Nitrite: NEGATIVE
Protein, ur: 100 mg/dL — AB
Specific Gravity, Urine: 1.011 (ref 1.005–1.030)
pH: 8 (ref 5.0–8.0)

## 2022-05-04 LAB — CBC WITH DIFFERENTIAL/PLATELET
Abs Immature Granulocytes: 0.02 10*3/uL (ref 0.00–0.07)
Basophils Absolute: 0 10*3/uL (ref 0.0–0.1)
Basophils Relative: 1 %
Eosinophils Absolute: 0.2 10*3/uL (ref 0.0–0.5)
Eosinophils Relative: 3 %
HCT: 39.7 % (ref 36.0–46.0)
Hemoglobin: 12.8 g/dL (ref 12.0–15.0)
Immature Granulocytes: 0 %
Lymphocytes Relative: 26 %
Lymphs Abs: 1.7 10*3/uL (ref 0.7–4.0)
MCH: 28.8 pg (ref 26.0–34.0)
MCHC: 32.2 g/dL (ref 30.0–36.0)
MCV: 89.4 fL (ref 80.0–100.0)
Monocytes Absolute: 0.5 10*3/uL (ref 0.1–1.0)
Monocytes Relative: 8 %
Neutro Abs: 4 10*3/uL (ref 1.7–7.7)
Neutrophils Relative %: 62 %
Platelets: 246 10*3/uL (ref 150–400)
RBC: 4.44 MIL/uL (ref 3.87–5.11)
RDW: 13.2 % (ref 11.5–15.5)
WBC: 6.4 10*3/uL (ref 4.0–10.5)
nRBC: 0 % (ref 0.0–0.2)

## 2022-05-04 LAB — COMPREHENSIVE METABOLIC PANEL
ALT: 21 U/L (ref 0–44)
AST: 16 U/L (ref 15–41)
Albumin: 4 g/dL (ref 3.5–5.0)
Alkaline Phosphatase: 93 U/L (ref 38–126)
Anion gap: 13 (ref 5–15)
BUN: 91 mg/dL — ABNORMAL HIGH (ref 8–23)
CO2: 28 mmol/L (ref 22–32)
Calcium: 8.8 mg/dL — ABNORMAL LOW (ref 8.9–10.3)
Chloride: 98 mmol/L (ref 98–111)
Creatinine, Ser: 3.17 mg/dL — ABNORMAL HIGH (ref 0.44–1.00)
GFR, Estimated: 16 mL/min — ABNORMAL LOW (ref >60)
Glucose, Bld: 346 mg/dL — ABNORMAL HIGH (ref 70–99)
Potassium: 4.3 mmol/L (ref 3.5–5.1)
Sodium: 139 mmol/L (ref 135–145)
Total Bilirubin: 0.5 mg/dL (ref 0.3–1.2)
Total Protein: 8.3 g/dL — ABNORMAL HIGH (ref 6.5–8.1)

## 2022-05-04 LAB — BLOOD GAS, VENOUS
Acid-Base Excess: 8.8 mmol/L — ABNORMAL HIGH (ref 0.0–2.0)
Bicarbonate: 33.5 mmol/L — ABNORMAL HIGH (ref 20.0–28.0)
O2 Saturation: 91.9 %
Patient temperature: 37.1
pCO2, Ven: 45 mmHg (ref 44–60)
pH, Ven: 7.48 — ABNORMAL HIGH (ref 7.25–7.43)
pO2, Ven: 59 mmHg — ABNORMAL HIGH (ref 32–45)

## 2022-05-04 LAB — CBG MONITORING, ED: Glucose-Capillary: 328 mg/dL — ABNORMAL HIGH (ref 70–99)

## 2022-05-04 LAB — LIPASE, BLOOD: Lipase: 31 U/L (ref 11–51)

## 2022-05-04 MED ORDER — SODIUM CHLORIDE 0.9 % IV BOLUS
1000.0000 mL | Freq: Once | INTRAVENOUS | Status: AC
  Administered 2022-05-04: 1000 mL via INTRAVENOUS

## 2022-05-04 MED ORDER — PROCHLORPERAZINE EDISYLATE 10 MG/2ML IJ SOLN
10.0000 mg | Freq: Once | INTRAMUSCULAR | Status: AC
  Administered 2022-05-04: 10 mg via INTRAVENOUS
  Filled 2022-05-04: qty 2

## 2022-05-04 MED ORDER — ONDANSETRON 4 MG PO TBDP: 4.0000 mg | ORAL_TABLET | Freq: Three times a day (TID) | ORAL | 0 refills | Status: AC | PRN

## 2022-05-04 MED ORDER — FENTANYL CITRATE PF 50 MCG/ML IJ SOSY
50.0000 ug | PREFILLED_SYRINGE | Freq: Once | INTRAMUSCULAR | Status: AC
  Administered 2022-05-04: 50 ug via INTRAVENOUS
  Filled 2022-05-04: qty 1

## 2022-05-04 NOTE — Discharge Instructions (Addendum)
Please take 6 capfuls of MiraLAX in a 32 oz bottle of Gatorade over 2-4 hour period. The following day take 3 capfuls. On day 3 start taking 1 capful 3 times a day. Slowly cut back as needed until you have normal bowel movements.

## 2022-05-04 NOTE — ED Triage Notes (Addendum)
Pt reports that she has been constipated for about 3-4 weeks. Also reports that she has not been able to tolerate PO. Hx of SBO and CKD stage 4. Reports urinating "every hour on the hour." Also reports muscle spasms, vertigo, and tinnitus, which seem to be chronic.

## 2022-05-04 NOTE — ED Notes (Signed)
Pt desated to 85 when sleeping. Pt placed on 2L [place]

## 2022-05-04 NOTE — ED Provider Notes (Signed)
[facility] DEPT Provider Note  CSN: [id number] Arrival date & time: 05/04/22 0155  Chief Complaint(s) Constipation  HPI Latasha [name] is a 66 y.o. female with a past medical history listed below including hypertension, diabetes, CKD who presents to the emergency department with 3 days of nausea and nonbloody nonbilious emesis with generalized abdominal discomfort mostly in the upper quadrants.  Patient also reports constipation for several days.  She has inability to tolerate p.o. intake.  Denies any associated chest pain or shortness of breath.  No recent fevers or infections.  No coughing or congestion.  The history is provided by the patient.    Past Medical History Past Medical History:  Diagnosis Date   BPPV (benign paroxysmal positional vertigo)    Chronic congestive heart failure with left ventricular diastolic dysfunction ([place])    CKD (chronic kidney disease), stage IV ([place]) 05/15/2017   Diabetes mellitus    Hyperlipidemia    Hypertension    Left atrial enlargement    Morbid obesity with BMI of 40.0-44.9, adult ([place])    Obstructive sleep apnea 01/22/2022   Palpitations    Patient Active Problem List   Diagnosis Date Noted   Obstructive sleep apnea 01/22/2022   Acute on chronic respiratory failure ([place]) 12/17/2021   Class 3 obesity ([place]) 12/17/2021   Reactive airway disease with wheezing 12/17/2021   Elevated d-dimer 12/17/2021   Fibula fracture 11/09/2020   Pneumonia due to COVID-19 virus 10/22/2020   Acute on chronic respiratory failure with hypoxia ([place]) 10/22/2020   Cardiorenal syndrome 03/05/2020   Chest pain, rule out acute myocardial infarction 03/07/2018   CKD (chronic kidney disease) stage 4, GFR 15-29 ml/min (HCC) 03/07/2018   Hyperphosphatemia 06/09/2017   Elevated ALT measurement 06/08/2017   Hypertensive heart disease with acute on chronic diastolic congestive heart failure (HCC)    Bilateral leg edema    Chronic  diastolic CHF (congestive heart failure) ([place]) 06/07/2017   Acute on chronic diastolic CHF (congestive heart failure) ([place]) 06/07/2017   ARF (acute renal failure) ([place])    Encephalopathy acute 05/15/2017   Hyperosmolar non-ketotic state in patient with type 2 diabetes mellitus ([place]) 05/15/2017   Chronic pain 05/15/2017   Acute kidney injury superimposed on chronic kidney disease ([place])    Hyperlipemia 01/19/2014   Palpitations 01/19/2014   PVC's (premature ventricular contractions) 01/19/2014   SCIATICA, RIGHT 12/12/2010   GASTROESOPHAGEAL REFLUX DISEASE 02/09/2010   LEG CRAMPS 02/09/2010   Morbid obesity ([place]) 12/01/2009   VITAMIN D DEFICIENCY 10/13/2009   CONSTIPATION 10/13/2009   LIVER FUNCTION TESTS, ABNORMAL, HX OF 10/13/2009   Essential hypertension 08/24/2009   SHOULDER PAIN, RIGHT, CHRONIC 08/24/2009   Insulin-requiring or dependent type II diabetes mellitus ([place]) 10/08/1991   Home Medication(s) Prior to Admission medications   Medication Sig Start Date End Date Taking? Authorizing Provider  ondansetron (ZOFRAN-ODT) 4 MG disintegrating tablet Take 1 tablet (4 mg total) by mouth every 8 (eight) hours as needed for up to 3 days for nausea or vomiting. 05/04/22 05/07/22 Yes Jasira Robinson, Grayce Sessions, MD  acetaminophen (TYLENOL) 500 MG tablet Take 1,000 mg by mouth every 6 (six) hours as needed for mild pain, fever or headache.    [provider]  albuterol (PROVENTIL HFA;VENTOLIN HFA) 108 (90 Base) MCG/ACT inhaler Inhale 2 puffs into the lungs every 6 (six) hours as needed for wheezing or shortness of breath.    [provider]  amLODipine (NORVASC) 10 MG tablet Take 1 tablet (10 mg total) by  mouth daily. 05/18/17   Eugenie Filler, MD  atorvastatin (LIPITOR) 40 MG tablet Take 40 mg by mouth daily.     [provider]  Biotin w/ Vitamins C & E (HAIR SKIN & NAILS GUMMIES PO) Take 2 tablets by mouth daily.    [provider]  budesonide-formoterol  (SYMBICORT) 160-4.5 MCG/ACT inhaler Inhale 2 puffs into the lungs 2 (two) times daily. 12/18/21   Aline August, MD  carvedilol (COREG) 25 MG tablet Take 1 tablet (25 mg total) by mouth 2 (two) times daily with a meal. 03/10/18   Florencia Reasons, MD  guaiFENesin-dextromethorphan (ROBITUSSIN DM) 100-10 MG/5ML syrup Take 10 mLs by mouth every 4 (four) hours as needed for cough. 12/18/21   Aline August, MD  insulin aspart protamine- aspart (NOVOLOG MIX 70/30) (70-30) 100 UNIT/ML injection Inject 50-60 Units into the skin See admin instructions. Inject 60 units into the skin in the morning and 50 units in the evening    [provider]  metolazone (ZAROXOLYN) 2.5 MG tablet Take 2.5 mg by mouth daily as needed. 03/21/22   [provider]  nitroGLYCERIN (NITROSTAT) 0.4 MG SL tablet Place 1 tablet (0.4 mg total) under the tongue every 5 (five) minutes as needed for chest pain. 03/10/18   Florencia Reasons, MD  pantoprazole (PROTONIX) 40 MG tablet Take 40 mg by mouth in the morning before breakfast 12/18/21   Aline August, MD  potassium chloride SA (KLOR-CON) 20 MEQ tablet Take 20 mEq by mouth daily.    [provider]  tiZANidine (ZANAFLEX) 2 MG tablet Take 2 mg by mouth 3 (three) times daily. 03/29/22   [provider]  torsemide (DEMADEX) 100 MG tablet Take 100 mg by mouth every morning. 01/30/22   [provider]  traMADol (ULTRAM) 50 MG tablet Take 50-100 mg by mouth daily as needed (for pain).    [provider]                                                                                                                                    Allergies Hydrocodone-acetaminophen, Oxycodone hcl, Amitriptyline, and Citalopram  Review of Systems Review of Systems As noted in HPI  Physical Exam Vital Signs  I have reviewed the triage vital signs BP (!) 154/55   Pulse 72   Temp 98.2 F (36.8 C) (Oral)   Resp 16   Ht 5\' 4"  (1.626 m)   Wt 117.9 kg   SpO2 97%   BMI  44.63 kg/m   Physical Exam Vitals reviewed.  Constitutional:      General: She is not in acute distress.    Appearance: She is well-developed. She is obese. She is not diaphoretic.  HENT:     Head: Normocephalic and atraumatic.     Right Ear: External ear normal.     Left Ear: External ear normal.     Nose: Nose normal.  Eyes:  General: No scleral icterus.    Conjunctiva/sclera: Conjunctivae normal.  Neck:     Trachea: Phonation normal.  Cardiovascular:     Rate and Rhythm: Normal rate and regular rhythm.  Pulmonary:     Effort: Pulmonary effort is normal. No respiratory distress.     Breath sounds: No stridor.  Abdominal:     General: There is distension.     Tenderness: There is abdominal tenderness in the right upper quadrant and epigastric area. There is no guarding or rebound.  Musculoskeletal:        General: Normal range of motion.     Cervical back: Normal range of motion.  Neurological:     Mental Status: She is alert and oriented to person, place, and time.  Psychiatric:        Behavior: Behavior normal.     ED Results and Treatments Labs (all labs ordered are listed, but only abnormal results are displayed) Labs Reviewed  COMPREHENSIVE METABOLIC PANEL - Abnormal; Notable for the following components:      Result Value   Glucose, Bld 346 (*)    BUN 91 (*)    Creatinine, Ser 3.17 (*)    Calcium 8.8 (*)    Total Protein 8.3 (*)    GFR, Estimated 16 (*)    All other components within normal limits  URINALYSIS, ROUTINE W REFLEX MICROSCOPIC - Abnormal; Notable for the following components:   Color, Urine STRAW (*)    Glucose, UA 150 (*)    Protein, ur 100 (*)    Bacteria, UA RARE (*)    All other components within normal limits  BLOOD GAS, VENOUS - Abnormal; Notable for the following components:   pH, Ven 7.48 (*)    pO2, Ven 59 (*)    Bicarbonate 33.5 (*)    Acid-Base Excess 8.8 (*)    All other components within normal limits  CBG MONITORING, ED  - Abnormal; Notable for the following components:   Glucose-Capillary 328 (*)    All other components within normal limits  LIPASE, BLOOD  CBC WITH DIFFERENTIAL/PLATELET                                                                                                                         EKG  EKG Interpretation  Date/Time:    Ventricular Rate:    PR Interval:    QRS Duration:   QT Interval:    QTC Calculation:   R Axis:     Text Interpretation:         Radiology CT ABDOMEN PELVIS WO CONTRAST  Result Date: 05/04/2022 CLINICAL DATA:  Constipation and frequent urination. History of small-bowel obstructions. History of stage IV CKD. EXAM: CT ABDOMEN AND PELVIS WITHOUT CONTRAST TECHNIQUE: Multidetector CT imaging of the abdomen and pelvis was performed following the standard protocol without IV contrast. RADIATION DOSE REDUCTION: This exam was performed according to the departmental dose-optimization program which includes automated exposure control, adjustment of the mA and/or  kV according to patient size and/or use of iterative reconstruction technique. COMPARISON:  CT without contrast 11/08/2020. FINDINGS: Lower chest: There is scattered subpleural reticulated scarring where the previous study demonstrated denser peripheral consolidations. Findings consistent with post pneumonic scarring. The cardiac size is normal. Hepatobiliary: 23 cm length mildly steatotic liver. No focal lesion is seen without contrast. There is either dense layering sludge or gravel in the proximal gallbladder. There is no wall thickening or biliary dilatation. Pancreas: Diffuse fatty atrophic changes. No mass, no inflammatory change or ductal dilatation. Spleen: No focal abnormality without contrast.  No splenomegaly. Adrenals/Urinary Tract: There is no adrenal mass. The unenhanced renal cortex is unremarkable. Perinephric stranding is symmetric and similar. There are renovascular calcifications at both renal hila. No  collecting system stones, ureteral stones or hydronephrosis are seen. There is no bladder thickening or intravesical stones. Stomach/Bowel: Unremarkable unopacified stomach and small bowel. The normal caliber appendix is well seen. There is moderate retained stool. Sigmoid diverticulosis without evidence of diverticulitis. Vascular/Lymphatic: Aortic atherosclerosis. The renal artery origins are both heavily calcified. No enlarged abdominal or pelvic lymph nodes. Reproductive: Status post hysterectomy. No adnexal masses. Other: Umbilical rectus diastasis and moderate fat hernia. No incarcerated hernia. There was body wall edema on the prior study which is not seen today. There is no free air, free hemorrhage or free fluid. Multiple pelvic phleboliths. Musculoskeletal: Chronic sacroiliitis. Degenerative change lumbar spine. No acute or significant osseous findings. IMPRESSION: 1. Constipation and diverticulosis but no evidence of bowel obstruction or inflammation. 2. Moderate bilateral perinephric stranding, could be inflammatory but is not changed in appearance from the prior study. 3. Layering dense sludge or gravel in the gallbladder. No wall thickening. 4. Prominent liver with mild steatosis. 5. Umbilical rectus diastasis and moderate-sized fat hernia, chronic. 6. Postpneumonic peripheral scarring in the lungs. Electronically Signed   By: Telford Nab M.D.   On: 05/04/2022 05:14    Pertinent labs & imaging results that were available during my care of the patient were reviewed by me and considered in my medical decision making (see MDM for details).  Medications Ordered in ED Medications  sodium chloride 0.9 % bolus 1,000 mL (0 mLs Intravenous Stopped 05/04/22 0621)  prochlorperazine (COMPAZINE) injection 10 mg (10 mg Intravenous Given 05/04/22 0324)  fentaNYL (SUBLIMAZE) injection 50 mcg (50 mcg Intravenous Given 05/04/22 0331)                                                                                                                                      Procedures Procedures  (including critical care time)  Medical Decision Making / ED Course    Complexity of Problem:  Co-morbidities/SDOH that complicate the patient evaluation/care: Noted above in HPI  Patient's presenting problem/concern, DDX, and MDM listed below: Abdominal pain with nausea and vomiting We will need to assess for evidence of intra-abdominal inflammatory/infectious process such as pancreatitis/biliary disease or bowel obstruction.  We will assess for any metabolic derangements including DKA, electrolyte derangements, worsening renal function.  Hospitalization Considered:  yes  Initial Intervention:  IVF and compazine    Complexity of Data:    Laboratory Tests ordered listed below with my independent interpretation: CBC without leukocytosis or anemia Metabolic panel without significant electrolyte derangement.  Hyperglycemia without evidence of DKA.  Stable renal function. No evidence of biliary obstruction or pancreatitis UA without evidence of infection   Imaging Studies ordered listed below with my independent interpretation: CT of the abdomen without evidence of bowel obstruction.  It did reveal constipation.  No other acute findings.     ED Course:    Assessment, Add'l Intervention, and Reassessment: Abdominal discomfort with nausea and vomiting Work-up reassuring Patient treated symptomatically and was able to tolerate p.o. intake    Final Clinical Impression(s) / ED Diagnoses Final diagnoses:  Generalized abdominal pain  Constipation, unspecified constipation type  Nausea and vomiting in adult  Hyperglycemia   The patient appears reasonably screened and/or stabilized for discharge and I doubt any other medical condition or other [facility] requiring further screening, evaluation, or treatment in the ED at this time. I have discussed the findings, Dx and Tx plan with the patient/family who  expressed understanding and agree(s) with the plan. Discharge instructions discussed at length. The patient/family was given strict return precautions who verbalized understanding of the instructions. No further questions at time of discharge.  Disposition: Discharge  Condition: Good  ED Discharge Orders          Ordered    ondansetron (ZOFRAN-ODT) 4 MG disintegrating tablet  Every 8 hours PRN        05/04/22 0624                    This chart was dictated using voice recognition software.  Despite best efforts to proofread,  errors can occur which can change the documentation meaning.    Fatima Blank, MD 05/04/22 [phone]

## 2022-05-06 ENCOUNTER — Encounter (HOSPITAL_BASED_OUTPATIENT_CLINIC_OR_DEPARTMENT_OTHER): Payer: Medicare HMO | Admitting: Pulmonary Disease

## 2022-05-07 DIAGNOSIS — H33303 Unspecified retinal break, bilateral: Secondary | ICD-10-CM | POA: Diagnosis not present

## 2022-05-10 ENCOUNTER — Encounter (HOSPITAL_COMMUNITY)
Admission: RE | Admit: 2022-05-10 | Discharge: 2022-05-10 | Disposition: A | Discharge status: Home / Self Care | Payer: Medicare HMO | Source: Ambulatory Visit | Attending: Internal Medicine | Admitting: Internal Medicine

## 2022-05-10 VITALS — BP 130/55 | HR 66 | Temp 97.6°F

## 2022-05-10 DIAGNOSIS — N189 Chronic kidney disease, unspecified: Secondary | ICD-10-CM | POA: Insufficient documentation

## 2022-05-10 DIAGNOSIS — I129 Hypertensive chronic kidney disease with stage 1 through stage 4 chronic kidney disease, or unspecified chronic kidney disease: Secondary | ICD-10-CM | POA: Diagnosis not present

## 2022-05-10 DIAGNOSIS — N179 Acute kidney failure, unspecified: Secondary | ICD-10-CM | POA: Insufficient documentation

## 2022-05-10 DIAGNOSIS — Z794 Long term (current) use of insulin: Secondary | ICD-10-CM | POA: Diagnosis not present

## 2022-05-10 DIAGNOSIS — N184 Chronic kidney disease, stage 4 (severe): Secondary | ICD-10-CM | POA: Diagnosis not present

## 2022-05-10 DIAGNOSIS — E1122 Type 2 diabetes mellitus with diabetic chronic kidney disease: Secondary | ICD-10-CM | POA: Diagnosis not present

## 2022-05-10 DIAGNOSIS — E1140 Type 2 diabetes mellitus with diabetic neuropathy, unspecified: Secondary | ICD-10-CM | POA: Diagnosis not present

## 2022-05-10 LAB — IRON AND TIBC
Iron: 62 ug/dL (ref 28–170)
Saturation Ratios: 24 % (ref 10.4–31.8)
TIBC: 260 ug/dL (ref 250–450)
UIBC: 198 ug/dL

## 2022-05-10 LAB — RENAL FUNCTION PANEL
Albumin: 3.6 g/dL (ref 3.5–5.0)
Anion gap: 14 (ref 5–15)
BUN: 96 mg/dL — ABNORMAL HIGH (ref 8–23)
CO2: 26 mmol/L (ref 22–32)
Calcium: 8.7 mg/dL — ABNORMAL LOW (ref 8.9–10.3)
Chloride: 96 mmol/L — ABNORMAL LOW (ref 98–111)
Creatinine, Ser: 4.17 mg/dL — ABNORMAL HIGH (ref 0.44–1.00)
GFR, Estimated: 11 mL/min — ABNORMAL LOW (ref >60)
Glucose, Bld: 235 mg/dL — ABNORMAL HIGH (ref 70–99)
Phosphorus: 4.6 mg/dL (ref 2.5–4.6)
Potassium: 4.4 mmol/L (ref 3.5–5.1)
Sodium: 136 mmol/L (ref 135–145)

## 2022-05-10 LAB — FERRITIN: Ferritin: 624 ng/mL — ABNORMAL HIGH (ref 11–307)

## 2022-05-10 MED ORDER — EPOETIN ALFA-EPBX 10000 UNIT/ML IJ SOLN: 10000.0000 [IU] | INTRAMUSCULAR | Status: DC

## 2022-05-10 MED ORDER — EPOETIN ALFA 10000 UNIT/ML IJ SOLN: 10000.0000 [IU] | INTRAMUSCULAR | Status: DC

## 2022-05-13 ENCOUNTER — Encounter (HOSPITAL_COMMUNITY): Payer: Medicare HMO

## 2022-05-13 LAB — POCT HEMOGLOBIN-HEMACUE: Hemoglobin: 12.2 g/dL (ref 12.0–15.0)

## 2022-05-14 ENCOUNTER — Other Ambulatory Visit: Payer: Self-pay

## 2022-05-14 NOTE — Patient Outreach (Signed)
[facility]) Care Management  05/14/2022  Latasha [name] [birth year] [id number]   Telephone call to patient for disease management follow up.   No answer.  HIPAA compliant voice message left.    Plan: If no return call, RN CM will attempt patient again in September.  Jone Baseman, RN, MSN [facility] Care Management Care Management Coordinator Direct Line [phone] Toll Free: [phone]  Fax: [phone]

## 2022-05-16 ENCOUNTER — Encounter (HOSPITAL_COMMUNITY): Payer: Self-pay | Admitting: Vascular Surgery

## 2022-05-16 ENCOUNTER — Other Ambulatory Visit: Payer: Self-pay

## 2022-05-16 NOTE — Progress Notes (Signed)
Latasha [name] denies chest pain or shortness of breath.  Patient denies having any s/s of Covid in her household.  Patient denies any known exposure to Covid.   Latasha [name] PCP is Dr. Rogers Blocker, cardiologist is Dr. [name].  Latasha [name] has type II diabetes, she reports that CBGs run 78-200; patient has a continuous glucose reader. I instructed Latasha [name] to take 35 units of 70/30 Insulin tonight. If CBG is greater than 70 in the am, take 30 units of 70/30 Insulin.  I instructed patient to check CBG after awaking and every 2 hours until arrival  to the hospital.  I Instructed patient if CBG is less than 70 to take 4 Glucose Tablets or 1 tube of Glucose Gel or 1/2 cup of a clear juice. Recheck CBG in 15 minutes if CBG is not over 70 call, pre- op desk at [phone] for further instructions. If scheduled to receive Insulin, do not take Insulin

## 2022-05-17 ENCOUNTER — Encounter (HOSPITAL_COMMUNITY)
Admission: RE | Disposition: A | Discharge status: Home / Self Care | Payer: Self-pay | Source: Ambulatory Visit | Attending: Vascular Surgery

## 2022-05-17 ENCOUNTER — Encounter (HOSPITAL_COMMUNITY): Payer: Self-pay | Admitting: Vascular Surgery

## 2022-05-17 ENCOUNTER — Ambulatory Visit (HOSPITAL_BASED_OUTPATIENT_CLINIC_OR_DEPARTMENT_OTHER): Payer: Medicare HMO | Admitting: Physician Assistant

## 2022-05-17 ENCOUNTER — Other Ambulatory Visit: Payer: Self-pay

## 2022-05-17 ENCOUNTER — Ambulatory Visit (HOSPITAL_COMMUNITY)
Admission: RE | Admit: 2022-05-17 | Discharge: 2022-05-17 | Disposition: A | Discharge status: Home / Self Care | Payer: Medicare HMO | Source: Ambulatory Visit | Attending: Vascular Surgery | Admitting: Vascular Surgery

## 2022-05-17 ENCOUNTER — Ambulatory Visit (HOSPITAL_COMMUNITY): Payer: Medicare HMO | Admitting: Physician Assistant

## 2022-05-17 DIAGNOSIS — N185 Chronic kidney disease, stage 5: Secondary | ICD-10-CM | POA: Insufficient documentation

## 2022-05-17 DIAGNOSIS — Z6841 Body Mass Index (BMI) 40.0 and over, adult: Secondary | ICD-10-CM | POA: Insufficient documentation

## 2022-05-17 DIAGNOSIS — Z794 Long term (current) use of insulin: Secondary | ICD-10-CM | POA: Insufficient documentation

## 2022-05-17 DIAGNOSIS — I509 Heart failure, unspecified: Secondary | ICD-10-CM

## 2022-05-17 DIAGNOSIS — I5032 Chronic diastolic (congestive) heart failure: Secondary | ICD-10-CM | POA: Insufficient documentation

## 2022-05-17 DIAGNOSIS — E1122 Type 2 diabetes mellitus with diabetic chronic kidney disease: Secondary | ICD-10-CM

## 2022-05-17 DIAGNOSIS — N184 Chronic kidney disease, stage 4 (severe): Secondary | ICD-10-CM

## 2022-05-17 DIAGNOSIS — I132 Hypertensive heart and chronic kidney disease with heart failure and with stage 5 chronic kidney disease, or end stage renal disease: Secondary | ICD-10-CM | POA: Diagnosis not present

## 2022-05-17 HISTORY — DX: Dyspnea, unspecified: R06.00

## 2022-05-17 HISTORY — DX: Pneumonia, unspecified organism: J18.9

## 2022-05-17 HISTORY — PX: CAPD INSERTION: SHX5233

## 2022-05-17 HISTORY — DX: Personal history of other medical treatment: Z92.89

## 2022-05-17 LAB — POCT I-STAT, CHEM 8
BUN: 128 mg/dL — ABNORMAL HIGH (ref 8–23)
Calcium, Ion: 1.05 mmol/L — ABNORMAL LOW (ref 1.15–1.40)
Chloride: 98 mmol/L (ref 98–111)
Creatinine, Ser: 4.6 mg/dL — ABNORMAL HIGH (ref 0.44–1.00)
Glucose, Bld: 208 mg/dL — ABNORMAL HIGH (ref 70–99)
HCT: 37 % (ref 36.0–46.0)
Hemoglobin: 12.6 g/dL (ref 12.0–15.0)
Potassium: 4.5 mmol/L (ref 3.5–5.1)
Sodium: 139 mmol/L (ref 135–145)
TCO2: 25 mmol/L (ref 22–32)

## 2022-05-17 LAB — GLUCOSE, CAPILLARY
Glucose-Capillary: 210 mg/dL — ABNORMAL HIGH (ref 70–99)
Glucose-Capillary: 188 mg/dL — ABNORMAL HIGH (ref 70–99)
Glucose-Capillary: 227 mg/dL — ABNORMAL HIGH (ref 70–99)

## 2022-05-17 SURGERY — LAPAROSCOPIC INSERTION CONTINUOUS AMBULATORY PERITONEAL DIALYSIS  (CAPD) CATHETER
Anesthesia: General | Site: Abdomen

## 2022-05-17 MED ORDER — FENTANYL CITRATE (PF) 100 MCG/2ML IJ SOLN: 25.0000 ug | INTRAMUSCULAR | Status: DC | PRN

## 2022-05-17 MED ORDER — MIDAZOLAM HCL 2 MG/2ML IJ SOLN
INTRAMUSCULAR | Status: AC
  Filled 2022-05-17: qty 2

## 2022-05-17 MED ORDER — TRAMADOL HCL 50 MG PO TABS: 100.0000 mg | ORAL_TABLET | Freq: Four times a day (QID) | ORAL | 0 refills | Status: DC | PRN

## 2022-05-17 MED ORDER — CHLORHEXIDINE GLUCONATE 4 % EX LIQD: 60.0000 mL | Freq: Once | CUTANEOUS | Status: DC

## 2022-05-17 MED ORDER — GLYCOPYRROLATE PF 0.2 MG/ML IJ SOSY
PREFILLED_SYRINGE | INTRAMUSCULAR | Status: AC
  Filled 2022-05-17: qty 3

## 2022-05-17 MED ORDER — LIDOCAINE 2% (20 MG/ML) 5 ML SYRINGE
INTRAMUSCULAR | Status: DC | PRN
  Administered 2022-05-17: 60 mg via INTRAVENOUS

## 2022-05-17 MED ORDER — FENTANYL CITRATE (PF) 250 MCG/5ML IJ SOLN
INTRAMUSCULAR | Status: AC
  Filled 2022-05-17: qty 5

## 2022-05-17 MED ORDER — SODIUM CHLORIDE 0.9 % IV SOLN: INTRAVENOUS | Status: DC

## 2022-05-17 MED ORDER — SUGAMMADEX SODIUM 200 MG/2ML IV SOLN
INTRAVENOUS | Status: DC | PRN
  Administered 2022-05-17: 300 mg via INTRAVENOUS

## 2022-05-17 MED ORDER — ACETAMINOPHEN 500 MG PO TABS
1000.0000 mg | ORAL_TABLET | Freq: Once | ORAL | Status: AC
  Administered 2022-05-17: 1000 mg via ORAL
  Filled 2022-05-17: qty 2

## 2022-05-17 MED ORDER — ONDANSETRON HCL 4 MG/2ML IJ SOLN
INTRAMUSCULAR | Status: DC | PRN
  Administered 2022-05-17: 4 mg via INTRAVENOUS

## 2022-05-17 MED ORDER — GLYCOPYRROLATE 0.2 MG/ML IJ SOLN
INTRAMUSCULAR | Status: DC | PRN
  Administered 2022-05-17: .2 mg via INTRAVENOUS

## 2022-05-17 MED ORDER — CHLORHEXIDINE GLUCONATE 0.12 % MT SOLN
15.0000 mL | OROMUCOSAL | Status: AC
  Administered 2022-05-17: 15 mL via OROMUCOSAL
  Filled 2022-05-17: qty 15

## 2022-05-17 MED ORDER — EPHEDRINE SULFATE-NACL 50-0.9 MG/10ML-% IV SOSY
PREFILLED_SYRINGE | INTRAVENOUS | Status: DC | PRN
  Administered 2022-05-17: 5 mg via INTRAVENOUS

## 2022-05-17 MED ORDER — 0.9 % SODIUM CHLORIDE (POUR BTL) OPTIME
TOPICAL | Status: DC | PRN
  Administered 2022-05-17: 1000 mL

## 2022-05-17 MED ORDER — DEXAMETHASONE SODIUM PHOSPHATE 10 MG/ML IJ SOLN
INTRAMUSCULAR | Status: DC | PRN
  Administered 2022-05-17: 4 mg via INTRAVENOUS

## 2022-05-17 MED ORDER — SUGAMMADEX SODIUM 500 MG/5ML IV SOLN
INTRAVENOUS | Status: AC
  Filled 2022-05-17: qty 5

## 2022-05-17 MED ORDER — MIDAZOLAM HCL 2 MG/2ML IJ SOLN
INTRAMUSCULAR | Status: DC | PRN
  Administered 2022-05-17 (×2): 1 mg via INTRAVENOUS

## 2022-05-17 MED ORDER — FENTANYL CITRATE (PF) 250 MCG/5ML IJ SOLN
INTRAMUSCULAR | Status: DC | PRN
  Administered 2022-05-17 (×2): 50 ug via INTRAVENOUS

## 2022-05-17 MED ORDER — CEFAZOLIN SODIUM-DEXTROSE 2-4 GM/100ML-% IV SOLN
2.0000 g | INTRAVENOUS | Status: AC
  Administered 2022-05-17: 2 g via INTRAVENOUS
  Filled 2022-05-17: qty 100

## 2022-05-17 MED ORDER — SODIUM CHLORIDE 0.9 % IR SOLN
Status: DC | PRN
  Administered 2022-05-17: 1

## 2022-05-17 MED ORDER — CHLORHEXIDINE GLUCONATE 0.12 % MT SOLN
OROMUCOSAL | Status: AC
  Filled 2022-05-17: qty 15

## 2022-05-17 MED ORDER — ROCURONIUM BROMIDE 10 MG/ML (PF) SYRINGE
PREFILLED_SYRINGE | INTRAVENOUS | Status: DC | PRN
  Administered 2022-05-17: 20 mg via INTRAVENOUS
  Administered 2022-05-17: 50 mg via INTRAVENOUS

## 2022-05-17 MED ORDER — PHENYLEPHRINE HCL-NACL 20-0.9 MG/250ML-% IV SOLN
INTRAVENOUS | Status: DC | PRN
  Administered 2022-05-17: 25 ug/min via INTRAVENOUS

## 2022-05-17 MED ORDER — PROPOFOL 10 MG/ML IV BOLUS
INTRAVENOUS | Status: DC | PRN
  Administered 2022-05-17: 120 mg via INTRAVENOUS

## 2022-05-17 MED ORDER — ONDANSETRON HCL 4 MG/2ML IJ SOLN: 4.0000 mg | Freq: Once | INTRAMUSCULAR | Status: DC | PRN

## 2022-05-17 SURGICAL SUPPLY — 56 items
ADAPTER TITANIUM MEDIONICS (MISCELLANEOUS) ×4 IMPLANT
ADH SKN CLS APL DERMABOND .7 (GAUZE/BANDAGES/DRESSINGS) ×4
ADPR DLYS CATH STRL LF DISP (MISCELLANEOUS) ×2
APL PRP STRL LF DISP 70% ISPRP (MISCELLANEOUS) ×4
APPLIER CLIP 5 13 M/L LIGAMAX5 (MISCELLANEOUS)
APR CLP MED LRG 5 ANG JAW (MISCELLANEOUS)
BAG DECANTER FOR FLEXI CONT (MISCELLANEOUS) ×4 IMPLANT
BIOPATCH RED 1 DISK 7.0 (GAUZE/BANDAGES/DRESSINGS) ×2 IMPLANT
BLADE CLIPPER SURG (BLADE) IMPLANT
BLADE SURG 11 STRL SS (BLADE) ×4 IMPLANT
CATH EXTENDED DIALYSIS (CATHETERS) ×2 IMPLANT
CHLORAPREP W/TINT 26 (MISCELLANEOUS) ×6 IMPLANT
CLIP APPLIE 5 13 M/L LIGAMAX5 (MISCELLANEOUS) IMPLANT
COVER SURGICAL LIGHT HANDLE (MISCELLANEOUS) ×4 IMPLANT
DERMABOND ADVANCED (GAUZE/BANDAGES/DRESSINGS) ×2
DERMABOND ADVANCED .7 DNX12 (GAUZE/BANDAGES/DRESSINGS) ×4 IMPLANT
DEVICE TROCAR PUNCTURE CLOSURE (ENDOMECHANICALS) ×4 IMPLANT
DRSG TEGADERM 4X4.75 (GAUZE/BANDAGES/DRESSINGS) ×8 IMPLANT
ELECT REM PT RETURN 9FT ADLT (ELECTROSURGICAL) ×3
ELECTRODE REM PT RTRN 9FT ADLT (ELECTROSURGICAL) ×3 IMPLANT
GAUZE SPONGE 4X4 12PLY STRL (GAUZE/BANDAGES/DRESSINGS) ×4 IMPLANT
GLOVE INDICATOR 6.5 STRL GRN (GLOVE) ×4 IMPLANT
GLOVE SURG UNDER LTX SZ7.5 (GLOVE) ×4 IMPLANT
GOWN STRL REUS W/ TWL LRG LVL3 (GOWN DISPOSABLE) ×6 IMPLANT
GOWN STRL REUS W/ TWL XL LVL3 (GOWN DISPOSABLE) ×3 IMPLANT
GOWN STRL REUS W/TWL LRG LVL3 (GOWN DISPOSABLE) ×6
GOWN STRL REUS W/TWL XL LVL3 (GOWN DISPOSABLE) ×3
GRASPER SUT TROCAR 14GX15 (MISCELLANEOUS) ×4 IMPLANT
IV NS 1000ML (IV SOLUTION) ×3
IV NS 1000ML BAXH (IV SOLUTION) ×3 IMPLANT
KIT BASIN OR (CUSTOM PROCEDURE TRAY) ×4 IMPLANT
KIT TURNOVER KIT B (KITS) ×4 IMPLANT
NDL INSUFFLATION 14GA 120MM (NEEDLE) ×2 IMPLANT
NEEDLE INSUFFLATION 14GA 120MM (NEEDLE) ×6 IMPLANT
NS IRRIG 1000ML POUR BTL (IV SOLUTION) ×4 IMPLANT
PAD ARMBOARD 7.5X6 YLW CONV (MISCELLANEOUS) ×8 IMPLANT
SET CYSTO W/LG BORE CLAMP LF (SET/KITS/TRAYS/PACK) ×4 IMPLANT
SET EXT 12IN DIALYSIS STAY-SAF (MISCELLANEOUS) ×4 IMPLANT
SET IRRIG TUBING LAPAROSCOPIC (IRRIGATION / IRRIGATOR) IMPLANT
SET TUBE SMOKE EVAC HIGH FLOW (TUBING) ×4 IMPLANT
SLEEVE ENDOPATH XCEL 5M (ENDOMECHANICALS) ×8 IMPLANT
SPIKE FLUID TRANSFER (MISCELLANEOUS) ×4 IMPLANT
STYLET FALLER (MISCELLANEOUS) ×6 IMPLANT
STYLET FALLER MEDIONICS (MISCELLANEOUS) ×4 IMPLANT
SUT MNCRL AB 4-0 PS2 18 (SUTURE) ×6 IMPLANT
SUT PROLENE 0 SH 30 (SUTURE) ×8 IMPLANT
SUT SILK 0 TIES 10X30 (SUTURE) ×4 IMPLANT
SUT VICRYL 3 0 (SUTURE) IMPLANT
TOWEL GREEN STERILE (TOWEL DISPOSABLE) ×4 IMPLANT
TOWEL GREEN STERILE FF (TOWEL DISPOSABLE) ×4 IMPLANT
TRAY LAPAROSCOPIC MC (CUSTOM PROCEDURE TRAY) ×4 IMPLANT
TROCAR 5MMX150MM (TROCAR) ×2 IMPLANT
TROCAR XCEL NON-BLD 11X100MML (ENDOMECHANICALS) IMPLANT
TROCAR XCEL NON-BLD 5MMX100MML (ENDOMECHANICALS) ×4 IMPLANT
TROCAR Z-THREAD OPTICAL 5X100M (TROCAR) ×2 IMPLANT
WATER STERILE IRR 1000ML POUR (IV SOLUTION) ×4 IMPLANT

## 2022-05-17 NOTE — Interval H&P Note (Signed)
History and Physical Interval Note:  05/17/2022 9:32 AM  Latasha [name]  has presented today for surgery, with the diagnosis of STAGE 5 CHRONIC KIDNEY DISEASE.  The various methods of treatment have been discussed with the patient and family. After consideration of risks, benefits and other options for treatment, the patient has consented to  Procedure(s): [place]  (CAPD) CATHETER (N/A) POSSIBLE LAPAROSCOPIC OMENTOPEXY/ (N/A) as a surgical intervention.  The patient's history has been reviewed, patient examined, no change in status, stable for surgery.  I have reviewed the patient's chart and labs.  Questions were answered to the patient's satisfaction.     Servando Snare

## 2022-05-17 NOTE — Transfer of Care (Signed)
Immediate Anesthesia Transfer of Care Note  Patient: Latasha [name]  Procedure(s) Performed: LAPAROSCOPIC INSERTION CONTINUOUS AMBULATORY PERITONEAL DIALYSIS CATHETER (Abdomen)  Patient Location: PACU  Anesthesia Type:General  Level of Consciousness: awake, alert  and oriented  Airway & Oxygen Therapy: Patient Spontanous Breathing and Patient connected to face mask oxygen  Post-op Assessment: Report given to RN and Post -op Vital signs reviewed and stable  Post vital signs: Reviewed and stable  Last Vitals:  Vitals Value Taken Time  BP 125/81 05/17/22 1305  Temp 36.4 C 05/17/22 1305  Pulse 65 05/17/22 1307  Resp 18 05/17/22 1307  SpO2 95 % 05/17/22 1307  Vitals shown include unvalidated device data.  Last Pain:  Vitals:   05/17/22 0856  TempSrc:   PainSc: 0-No pain      Patients Stated Pain Goal: 0 (14/99/69 2493)  Complications: No notable events documented.

## 2022-05-17 NOTE — Progress Notes (Signed)
Anesthesia made aware of CBG 210. Patient took 30units of Novolog 70/30 at home PTA at 0730. Anesthesia did not want to initiate peri-op glycemic protocol at this time. Will recheck CBG in two hours per unit protocol.

## 2022-05-17 NOTE — Anesthesia Preprocedure Evaluation (Addendum)
Anesthesia Evaluation  Patient identified by MRN, date of birth, ID band Patient awake    Reviewed: Allergy & Precautions, NPO status , Patient's Chart, lab work & pertinent test results, reviewed documented beta blocker date and time   History of Anesthesia Complications Negative for: history of anesthetic complications  Airway Mallampati: III  TM Distance: >3 FB Neck ROM: Full    Dental  (+) Dental Advisory Given, Partial Upper, Partial Lower   Pulmonary sleep apnea ,    Pulmonary exam normal        Cardiovascular hypertension, Pt. on medications and Pt. on home beta blockers +CHF  Normal cardiovascular exam   '23 TTE - EF 60 to 65%. There is moderate concentric left ventricular hypertrophy. Grade II diastolic dysfunction  (pseudonormalization). Left atrial size was mildly dilated. Mild MR    Neuro/Psych  BPPV  negative psych ROS   GI/Hepatic Neg liver ROS, GERD  Medicated and Controlled,  Endo/Other  diabetes, Type 2, Insulin DependentMorbid obesity  Renal/GU ESRFRenal disease     Musculoskeletal negative musculoskeletal ROS (+)   Abdominal (+) + obese,   Peds  Hematology negative hematology ROS (+)   Anesthesia Other Findings   Reproductive/Obstetrics                            Anesthesia Physical Anesthesia Plan  ASA: 3  Anesthesia Plan: General   Post-op Pain Management: Tylenol PO (pre-op)*   Induction: Intravenous  PONV Risk Score and Plan: 3 and Treatment may vary due to age or medical condition, Ondansetron and Dexamethasone  Airway Management Planned: Oral ETT  Additional Equipment: None  Intra-op Plan:   Post-operative Plan: Extubation in OR  Informed Consent: I have reviewed the patients History and Physical, chart, labs and discussed the procedure including the risks, benefits and alternatives for the proposed anesthesia with the patient or authorized  representative who has indicated his/her understanding and acceptance.     Dental advisory given  Plan Discussed with: CRNA and Anesthesiologist  Anesthesia Plan Comments:        Anesthesia Quick Evaluation

## 2022-05-17 NOTE — Op Note (Signed)
Patient name: Latasha [name] MRN: [id number] DOB: July 30, 1956 Sex: female  05/17/2022 Pre-operative Diagnosis: [id number] Post-operative diagnosis:  Same Surgeon:  Erlene Quan C. Donzetta Matters, MD Assistant: Paulo Fruit, PA Procedure Performed:  Laparoscopic placement of continuous ambulatory peritoneal dialysis catheter  Indications: 66 year old female now with advanced chronic kidney disease.  She is hopeful for peritoneal dialysis catheter and we discussed the risk and benefits particularly given her size and previous abdominal surgery of infection and primary nonfunction of the catheter.  Findings: I was unable to get a varies needle into her abdomen due to the thickness of her abdominal wall.  Ultimately I used an Optiview trocar from the right upper quadrant approach and did visualize significant adhesions down the left side of the abdomen and into the pelvis but this mostly involved the omentum and transverse colon.  I was able to place a catheter from the right side into the pelvis did not require omentopexy given the aforementioned adhesions.  At completion we instilled 650 cc of saline and returned just over 400.   Procedure:  The patient was identified in the holding area and taken to the room where she was placed supine on upper table and general anesthesia was induced.  She was gently prepped and draped in the abdomen the usual fashion, antibiotics were administered and timeout was called.  I attempted in the left upper quadrant to place a varies needle but I could never get it deep enough to be convincingly intra-abdominal.  With this I aborted and then made a 5 mm incision in the right upper quadrant using Optiview trocar was able to directly visualize entrance into the abdomen and then it fully insufflated the abdomen placed a 30 degree camera and fully inspected the abdomen.  It did appear that I was never able to enter in the left upper quadrant with the varies needle.  In the right upper quadrant  there was some bleeding from the liver but this appeared to be minor there was no injury to the intra-abdominal contents.  As mentioned above there was significant adhesions of the transverse colon and the omentum down the left side.  With this I elected to place a catheter on the right side.  Patient was placed in Trendelenburg and I placed another 5 mm port 5 cm inferior to my right upper quadrant port for a working instrument.  I then used a grasper was able to mobilize the small bowel more cephalad.  I made a 10 cm incision on the right side and in the infraumbilical position and then was able to tunnel a long trocar down and into the abdomen and placed the peritoneal dialysis catheter into the pelvis.  This was then retracted through the port.  I planned an exit site in the right upper quadrant and then trimmed both sides of the catheter to size and affixed them together and tied them with 0 Prolene suture.  I then tunneled the entire to the catheter with an exit site in the right upper quadrant.  I reinspected the abdomen unfortunately the cough had exited from the rectus muscle.  I was able to mobilize the catheter some and using a grasper pulled this back down into the rectus muscle.  The catheter itself was in the pelvis and good positioning.  There is no need for omentopexy given aforementioned scar tissue.  I then allowed all of the air to exit the abdomen.  We instilled 650 cc of saline and returned about 400 with  the patient in reverse Trendelenburg position.  Satisfied with this we placed a sterile dressing over the catheter and closed our port sites with 4 Monocryl and placed Dermabond at all sites.  She was then awakened from anesthesia having tolerated procedure without any complication.  All counts were correct at completion.   Given the complexity of the case,  the assistant was necessary in order to expedient the procedure and safely perform the technical aspects of the operation.  The  assistant was used to operate the camera as well as assist with tunneling of the catheter.  These skills could not have been adequately performed by a scrub tech assistant.     EBL: 20cc    Eliya Bubar C. Donzetta Matters, MD [facility] Office: [phone] Pager: [phone]

## 2022-05-17 NOTE — Anesthesia Procedure Notes (Signed)
Procedure Name: Intubation Date/Time: 05/17/2022 11:32 AM  Performed by: Mariea Clonts, CRNAPre-anesthesia Checklist: Patient identified, Emergency Drugs available, Suction available and Patient being monitored Patient Re-evaluated:Patient Re-evaluated prior to induction Oxygen Delivery Method: Circle System Utilized Preoxygenation: Pre-oxygenation with 100% oxygen Induction Type: IV induction Ventilation: Mask ventilation without difficulty Laryngoscope Size: Miller and 2 Grade View: Grade I Tube type: Oral Tube size: 7.0 mm Number of attempts: 1 Airway Equipment and Method: Stylet and Oral airway Placement Confirmation: ETT inserted through vocal cords under direct vision, positive ETCO2 and breath sounds checked- equal and bilateral Tube secured with: Tape Dental Injury: Teeth and Oropharynx as per pre-operative assessment

## 2022-05-18 NOTE — Anesthesia Postprocedure Evaluation (Signed)
Anesthesia Post Note  Patient: Latasha [name]  Procedure(s) Performed: LAPAROSCOPIC INSERTION CONTINUOUS AMBULATORY PERITONEAL DIALYSIS CATHETER (Abdomen)     Patient location during evaluation: PACU Anesthesia Type: General Level of consciousness: awake and alert Pain management: pain level controlled Vital Signs Assessment: post-procedure vital signs reviewed and stable Respiratory status: spontaneous breathing, nonlabored ventilation and respiratory function stable Cardiovascular status: stable and blood pressure returned to baseline Anesthetic complications: no   No notable events documented.  Last Vitals:  Vitals:   05/17/22 1320 05/17/22 1340  BP: 112/61 (!) 130/57  Pulse: 62 64  Resp: 12 19  Temp:  36.4 C  SpO2: 97% 98%    Last Pain:  Vitals:   05/17/22 1340  TempSrc:   PainSc: 0-No pain   Pain Goal: Patients Stated Pain Goal: 0 (05/17/22 0856)                 Audry Pili

## 2022-05-20 ENCOUNTER — Encounter (HOSPITAL_COMMUNITY): Payer: Self-pay | Admitting: Vascular Surgery

## 2022-05-24 ENCOUNTER — Ambulatory Visit
Admission: RE | Admit: 2022-05-24 | Discharge: 2022-05-24 | Disposition: A | Discharge status: Home / Self Care | Payer: Medicare HMO | Source: Ambulatory Visit | Attending: Nephrology | Admitting: Nephrology

## 2022-05-24 ENCOUNTER — Encounter (HOSPITAL_COMMUNITY): Payer: Medicare HMO

## 2022-05-24 ENCOUNTER — Other Ambulatory Visit: Payer: Self-pay | Admitting: Nephrology

## 2022-05-24 DIAGNOSIS — Z043 Encounter for examination and observation following other accident: Secondary | ICD-10-CM | POA: Diagnosis not present

## 2022-05-24 DIAGNOSIS — D649 Anemia, unspecified: Secondary | ICD-10-CM | POA: Diagnosis not present

## 2022-05-24 DIAGNOSIS — Z466 Encounter for fitting and adjustment of urinary device: Secondary | ICD-10-CM

## 2022-05-24 DIAGNOSIS — N185 Chronic kidney disease, stage 5: Secondary | ICD-10-CM | POA: Diagnosis not present

## 2022-05-29 DIAGNOSIS — Z992 Dependence on renal dialysis: Secondary | ICD-10-CM | POA: Diagnosis not present

## 2022-05-29 DIAGNOSIS — E1122 Type 2 diabetes mellitus with diabetic chronic kidney disease: Secondary | ICD-10-CM

## 2022-05-29 DIAGNOSIS — Z794 Long term (current) use of insulin: Secondary | ICD-10-CM | POA: Diagnosis not present

## 2022-05-29 DIAGNOSIS — N186 End stage renal disease: Secondary | ICD-10-CM | POA: Diagnosis not present

## 2022-06-04 DIAGNOSIS — H353122 Nonexudative age-related macular degeneration, left eye, intermediate dry stage: Secondary | ICD-10-CM | POA: Diagnosis not present

## 2022-06-06 ENCOUNTER — Other Ambulatory Visit: Payer: Self-pay

## 2022-06-06 NOTE — Patient Outreach (Signed)
[name]) Care Management  06/06/2022  Latasha [name] July 19, 1956 [id number]   RN CM closing case:  pt will be followed for care coordination by [facility].  Jone Baseman, RN, MSN [facility] Care Management Care Management Coordinator Direct Line [phone] Toll Free: [phone]  Fax: [phone]

## 2022-06-07 ENCOUNTER — Encounter (HOSPITAL_COMMUNITY): Payer: Medicare HMO

## 2022-06-12 ENCOUNTER — Telehealth: Payer: Self-pay

## 2022-06-12 ENCOUNTER — Ambulatory Visit: Payer: Self-pay

## 2022-06-12 NOTE — Patient Instructions (Signed)
Visit Information  Thank you for taking time to visit with me today. Please don't hesitate to contact me if I can be of assistance to you.   Following are the goals we discussed today:   Goals Addressed             This Visit's Progress    Start Hemodialysis       Care Coordination Interventions: Patient to start Hemodialysis Evaluation of current treatment plan related to chronic kidney disease self management and patient's adherence to plan as established by provider      Discussed plans with patient for ongoing care management follow up and provided patient with direct contact information for care management team    Discussed the impact of chronic kidney disease on daily life and mental health and acknowledged and normalized feelings of disempowerment, fear, and frustration              Our next appointment is by telephone on 07-11-22 at 12 noon  Please call the care guide team at [phone] if you need to cancel or reschedule your appointment.   If you are experiencing a Mental Health or [place] or need someone to talk to, please call the Suicide and Crisis Lifeline: 988   Patient verbalizes understanding of instructions and care plan provided today and agrees to view in [place]. Active MyChart status and patient understanding of how to access instructions and care plan via MyChart confirmed with patient.     Telephone follow up appointment with care management team member scheduled for: 07-11-22  Jone Baseman RN, MSN [place] [facility] Care Management Coordinator Direct Line [phone] Toll Free: [phone]  Fax: [phone]

## 2022-06-12 NOTE — Patient Outreach (Signed)
  Care Coordination   Follow Up Visit Note   06/12/2022 Name: Latasha [name] MRN: [id number] DOB: [birth year]  Latasha [name] is a 66 y.o. year old female who sees Shirline Frees, MD for primary care. I spoke with  Latasha [name] by phone today.  What matters to the patients health and wellness today?  Starting Hemodialysis.  Patient disappointed that she will not be able to do peritoneal dialysis at home due to vision problrms. Will be getting dialysis temporary access to start hemodialysis.      Goals Addressed             This Visit's Progress    Start Hemodialysis       Care Coordination Interventions: Patient to start Hemodialysis Evaluation of current treatment plan related to chronic kidney disease self management and patient's adherence to plan as established by provider      Discussed plans with patient for ongoing care management follow up and provided patient with direct contact information for care management team    Discussed the impact of chronic kidney disease on daily life and mental health and acknowledged and normalized feelings of disempowerment, fear, and frustration              SDOH assessments and interventions completed:  Yes  SDOH Interventions Today    Flowsheet Row Most Recent Value  SDOH Interventions   Social Connections Interventions Intervention Not Indicated        Care Coordination Interventions Activated:  Yes  Care Coordination Interventions:  Yes, provided   Follow up plan: Follow up call scheduled for October    Encounter Outcome:  Pt. Visit Completed   Jone Baseman, RN, MSN [place] Management Care Management Coordinator Direct Line [phone] Toll Free: [phone]  Fax: [phone]

## 2022-06-13 ENCOUNTER — Other Ambulatory Visit: Payer: Self-pay | Admitting: *Deleted

## 2022-06-13 DIAGNOSIS — N184 Chronic kidney disease, stage 4 (severe): Secondary | ICD-10-CM

## 2022-06-17 DIAGNOSIS — Z992 Dependence on renal dialysis: Secondary | ICD-10-CM | POA: Diagnosis not present

## 2022-06-17 DIAGNOSIS — N186 End stage renal disease: Secondary | ICD-10-CM | POA: Diagnosis not present

## 2022-06-19 ENCOUNTER — Ambulatory Visit: Payer: Medicare HMO | Admitting: Vascular Surgery

## 2022-06-19 ENCOUNTER — Encounter (HOSPITAL_COMMUNITY): Payer: Self-pay

## 2022-06-19 ENCOUNTER — Encounter: Payer: Self-pay | Admitting: Vascular Surgery

## 2022-06-19 ENCOUNTER — Ambulatory Visit (HOSPITAL_COMMUNITY)
Admission: RE | Admit: 2022-06-19 | Discharge: 2022-06-19 | Disposition: A | Discharge status: Home / Self Care | Payer: Medicare HMO | Source: Ambulatory Visit | Attending: Vascular Surgery | Admitting: Vascular Surgery

## 2022-06-19 ENCOUNTER — Ambulatory Visit (INDEPENDENT_AMBULATORY_CARE_PROVIDER_SITE_OTHER)
Admission: RE | Admit: 2022-06-19 | Discharge: 2022-06-19 | Disposition: A | Discharge status: Home / Self Care | Payer: Medicare HMO | Source: Ambulatory Visit | Attending: Vascular Surgery | Admitting: Vascular Surgery

## 2022-06-19 VITALS — BP 154/76 | HR 66 | Temp 98.0°F | Resp 20 | Ht 64.0 in | Wt 260.0 lb

## 2022-06-19 DIAGNOSIS — N184 Chronic kidney disease, stage 4 (severe): Secondary | ICD-10-CM | POA: Insufficient documentation

## 2022-06-19 DIAGNOSIS — N186 End stage renal disease: Secondary | ICD-10-CM | POA: Diagnosis not present

## 2022-06-19 NOTE — H&P (View-Only) (Signed)
Patient ID: Latasha [name], female   DOB: [birth year], 66 y.o.   MRN: [id number]  Reason for Consult: Follow-up   Referred by Justin Mend, MD  Subjective:     HPI:  Latasha [name] is a 66 y.o. female now approaching end-stage renal disease has a tunneled dialysis catheter for dialysis.  I recently placed a peritoneal dialysis catheter which has been working but the patient does not have the eyesight to make this work and is now considering hemodialysis.  She is left-hand dominant.  She is here to discuss permanent upper extremity access.  She denies any previous chest or breast surgery other than her recent catheter placement.  Past Medical History:  Diagnosis Date   BPPV (benign paroxysmal positional vertigo)    Chronic congestive heart failure with left ventricular diastolic dysfunction (HCC)    CKD (chronic kidney disease), stage IV ([place]) 05/15/2017   Diabetes mellitus    Dyspnea    History of blood transfusion    Hyperlipidemia    Hypertension    Left atrial enlargement    Morbid obesity with BMI of 40.0-44.9, adult ([place])    Obstructive sleep apnea 01/22/2022   Palpitations    Pneumonia    Family History  Problem Relation Age of Onset   Heart disease Father    Hypertension Father    Alzheimer's disease Mother    Diabetes Mother    Diabetes Sister    Dementia Other    Past Surgical History:  Procedure Laterality Date   ABDOMINAL HYSTERECTOMY     BIOPSY  11/09/2020   Procedure: BIOPSY;  Surgeon: Wilford Corner, MD;  Location: [facility];  Service: Endoscopy;;   CAPD INSERTION N/A 05/17/2022   Procedure: LAPAROSCOPIC INSERTION CONTINUOUS AMBULATORY PERITONEAL DIALYSIS CATHETER;  Surgeon: Waynetta Sandy, MD;  Location: [facility];  Service: Vascular;  Laterality: N/A;   ESOPHAGOGASTRODUODENOSCOPY N/A 11/09/2020   Procedure: ESOPHAGOGASTRODUODENOSCOPY (EGD);  Surgeon: Wilford Corner, MD;  Location: [facility];  Service: Endoscopy;  Laterality: N/A;   EYE  SURGERY     cataract/left eye, right eye had bleeding in back   IR FLUORO GUIDE CV LINE RIGHT  10/26/2020   IR US GUIDE VASC ACCESS RIGHT  10/26/2020   KNEE SURGERY     LEFT HEART CATH AND CORONARY ANGIOGRAPHY N/A 03/09/2018   Procedure: LEFT HEART CATH AND CORONARY ANGIOGRAPHY;  Surgeon: Lorretta Harp, MD;  Location: [facility];  Service: Cardiovascular;  Laterality: N/A;   TRIGGER FINGER RELEASE      Short Social History:  Social History   Tobacco Use   Smoking status: Never   Smokeless tobacco: Never  Substance Use Topics   Alcohol use: Yes    Comment:  rare    Allergies  Allergen Reactions   Hydrocodone-Acetaminophen Other (See Comments)    Unknown reaction   Oxycodone Hcl Other (See Comments)    Unknown reaction   Amitriptyline Nausea Only   Celexa [Citalopram] Other (See Comments)    Nightmares     Current Outpatient Medications  Medication Sig Dispense Refill   acetaminophen (TYLENOL) 500 MG tablet Take 1,000 mg by mouth every 6 (six) hours as needed for mild pain, fever or headache.     albuterol (PROVENTIL HFA;VENTOLIN HFA) 108 (90 Base) MCG/ACT inhaler Inhale 2 puffs into the lungs every 6 (six) hours as needed for wheezing or shortness of breath.     amLODipine (NORVASC) 10 MG tablet Take 1 tablet (10 mg total) by mouth  daily. 30 tablet 0   atorvastatin (LIPITOR) 40 MG tablet Take 40 mg by mouth daily.      budesonide-formoterol (SYMBICORT) 160-4.5 MCG/ACT inhaler Inhale 2 puffs into the lungs 2 (two) times daily. 1 each 0   carvedilol (COREG) 25 MG tablet Take 1 tablet (25 mg total) by mouth 2 (two) times daily with a meal. 60 tablet 0   gabapentin (NEURONTIN) 100 MG capsule Take 300 mg by mouth 3 (three) times daily.     guaiFENesin-dextromethorphan (ROBITUSSIN DM) 100-10 MG/5ML syrup Take 10 mLs by mouth every 4 (four) hours as needed for cough. 236 mL 0   insulin aspart protamine- aspart (NOVOLOG MIX 70/30) (70-30) 100 UNIT/ML injection Inject 50-60  Units into the skin See admin instructions. Inject 60 units into the skin in the morning and 50 units in the evening     metolazone (ZAROXOLYN) 2.5 MG tablet Take 2.5 mg by mouth daily.     ondansetron (ZOFRAN) 4 MG tablet Take 4 mg by mouth every 8 (eight) hours as needed for nausea or vomiting.     pantoprazole (PROTONIX) 40 MG tablet Take 40 mg by mouth in the morning before breakfast (Patient taking differently: Take 40 mg by mouth 2 (two) times daily.)     potassium chloride SA (KLOR-CON) 20 MEQ tablet Take 20 mEq by mouth daily.     tiZANidine (ZANAFLEX) 2 MG tablet Take 2 mg by mouth 3 (three) times daily.     torsemide (DEMADEX) 100 MG tablet Take 100 mg by mouth every morning.     traMADol (ULTRAM) 50 MG tablet Take 2 tablets (100 mg total) by mouth every 6 (six) hours as needed (for pain). 15 tablet 0   nitroGLYCERIN (NITROSTAT) 0.4 MG SL tablet Place 1 tablet (0.4 mg total) under the tongue every 5 (five) minutes as needed for chest pain. (Patient not taking: Reported on 06/19/2022) 30 tablet 0   No current facility-administered medications for this visit.    Review of Systems  Constitutional:  Constitutional negative. HENT: HENT negative.  Eyes: Positive for loss of vision.   Respiratory: Respiratory negative.  Cardiovascular: Cardiovascular negative.  GI: Gastrointestinal negative.  Musculoskeletal: Musculoskeletal negative.  Neurological: Neurological negative. Hematologic: Hematologic/lymphatic negative.  Psychiatric: Psychiatric negative.        Objective:  Objective   Vitals:   06/19/22 1219  BP: (!) 154/76  Pulse: 66  Resp: 20  Temp: 98 F (36.7 C)  SpO2: 92%  Weight: 260 lb (117.9 kg)  Height: 5\' 4"  (1.626 m)   Body mass index is 44.63 kg/m.  Physical Exam HENT:     Head: Normocephalic.     Nose: Nose normal.  Eyes:     Pupils: Pupils are equal, round, and reactive to light.  Cardiovascular:     Rate and Rhythm: Normal rate.     Pulses: Normal  pulses.  Pulmonary:     Effort: Pulmonary effort is normal.  Abdominal:     General: Abdomen is flat.  Musculoskeletal:        General: Normal range of motion.     Cervical back: Normal range of motion and neck supple.     Right lower leg: No edema.     Left lower leg: No edema.  Skin:    General: Skin is warm and dry.     Capillary Refill: Capillary refill takes less than 2 seconds.  Neurological:     General: No focal deficit present.     Mental  Status: She is alert.  Psychiatric:        Mood and Affect: Mood normal.        Behavior: Behavior normal.        Thought Content: Thought content normal.    Data: Right Cephalic   Diameter (cm)Depth (cm)Findings   +-----------------+-------------+----------+---------+  Prox upper arm       0.41        2.30              +-----------------+-------------+----------+---------+  Mid upper arm        0.48        2.00   branching  +-----------------+-------------+----------+---------+  Dist upper arm       0.58        1.60   branching  +-----------------+-------------+----------+---------+  Antecubital fossa    0.57        0.54              +-----------------+-------------+----------+---------+  Prox forearm         0.54        0.16   branching  +-----------------+-------------+----------+---------+  Mid forearm          0.38        0.46              +-----------------+-------------+----------+---------+  Dist forearm         0.44        0.37              +-----------------+-------------+----------+---------+   Right Pre-Dialysis Findings:  +-----------------------+----------+--------------------+---------+--------  +  Location               PSV (cm/s)Intralum. Diam. (cm)Waveform  Comments  +-----------------------+----------+--------------------+---------+--------  +  Brachial Antecub. fossa77        0.43                triphasic             +-----------------------+----------+--------------------+---------+--------  +  Radial Art at Wrist    95        0.19                triphasic            +-----------------------+----------+--------------------+---------+--------  +  Ulnar Art at Wrist     114       0.16                triphasic            +-----------------------+----------+--------------------+---------+--------  +     Assessment/Plan:    66 year old female now with end-stage renal disease about to start dialysis via catheter.  She is left-hand dominant.  Plan for right arm access she appears to have suitable cephalic vein.  I will remove the PD catheter since this is not usable for her given her challenges with vision.  We will get this scheduled on a nondialysis day as soon as she figures out her dialysis schedule.  We discussed the risk benefits alternatives with the patient in the presence of her daughter.     Waynetta Sandy MD [facility]

## 2022-06-19 NOTE — Progress Notes (Signed)
Patient ID: Latasha [name], female   DOB: [birth year], 66 y.o.   MRN: [id number]  Reason for Consult: Follow-up   Referred by Justin Mend, MD  Subjective:     HPI:  Latasha [name] is a 66 y.o. female now approaching end-stage renal disease has a tunneled dialysis catheter for dialysis.  I recently placed a peritoneal dialysis catheter which has been working but the patient does not have the eyesight to make this work and is now considering hemodialysis.  She is left-hand dominant.  She is here to discuss permanent upper extremity access.  She denies any previous chest or breast surgery other than her recent catheter placement.  Past Medical History:  Diagnosis Date   BPPV (benign paroxysmal positional vertigo)    Chronic congestive heart failure with left ventricular diastolic dysfunction (HCC)    CKD (chronic kidney disease), stage IV ([place]) 05/15/2017   Diabetes mellitus    Dyspnea    History of blood transfusion    Hyperlipidemia    Hypertension    Left atrial enlargement    Morbid obesity with BMI of 40.0-44.9, adult ([place])    Obstructive sleep apnea 01/22/2022   Palpitations    Pneumonia    Family History  Problem Relation Age of Onset   Heart disease Father    Hypertension Father    Alzheimer's disease Mother    Diabetes Mother    Diabetes Sister    Dementia Other    Past Surgical History:  Procedure Laterality Date   ABDOMINAL HYSTERECTOMY     BIOPSY  11/09/2020   Procedure: BIOPSY;  Surgeon: Wilford Corner, MD;  [facility]: [facility];  Service: Endoscopy;;   CAPD INSERTION N/A 05/17/2022   Procedure: LAPAROSCOPIC INSERTION CONTINUOUS AMBULATORY PERITONEAL DIALYSIS CATHETER;  Surgeon: Waynetta Sandy, MD;  Location: [facility];  Service: Vascular;  Laterality: N/A;   ESOPHAGOGASTRODUODENOSCOPY N/A 11/09/2020   Procedure: ESOPHAGOGASTRODUODENOSCOPY (EGD);  Surgeon: Wil[facility] Corner, MD;  Location: [facility];  Service: Endoscopy;  Laterality: N/A;   EYE  SURGERY     cataract/left eye, right eye had bleeding in back   IR FLUORO GUIDE CV LINE RIGHT  10/26/2020   IR US GUIDE VASC ACCESS RIGHT  10/26/2020   KNEE SURGERY     LEFT HEART CATH AND CORONARY ANGIOGRAPHY N/A 03/09/2018   Procedure: LEFT HEART CATH AND CORONARY ANGIOGRAPHY;  Surgeon: Lorretta Harp, MD;  Location: [facility];  Service: Cardiovascular;  Laterality: N/A;   TRIGGER FINGER RELEASE      Short Social History:  Social History   Tobacco Use   Smoking status: Never   Smokeless tobacco: Never  Substance Use Topics   Alcohol use: Yes    Comment:  rare    Allergies  Allergen Reactions   Hydrocodone-Acetaminophen Other (See Comments)    Unknown reaction   Oxycodone Hcl Other (See Comments)    Unknown reaction   Amitriptyline Nausea Only   Celexa [Citalopram] Other (See Comments)    Nightmares     Current Outpatient Medications  Medication Sig Dispense Refill   acetaminophen (TYLENOL) 500 MG tablet Take 1,000 mg by mouth every 6 (six) hours as needed for mild pain, fever or headache.     albuterol (PROVENTIL HFA;VENTOLIN HFA) 108 (90 Base) MCG/ACT inhaler Inhale 2 puffs into the lungs every 6 (six) hours as needed for wheezing or shortness of breath.     amLODipine (NORVASC) 10 MG tablet Take 1 tablet (10 mg total) by mouth  daily. 30 tablet 0   atorvastatin (LIPITOR) 40 MG tablet Take 40 mg by mouth daily.      budesonide-formoterol (SYMBICORT) 160-4.5 MCG/ACT inhaler Inhale 2 puffs into the lungs 2 (two) times daily. 1 each 0   carvedilol (COREG) 25 MG tablet Take 1 tablet (25 mg total) by mouth 2 (two) times daily with a meal. 60 tablet 0   gabapentin (NEURONTIN) 100 MG capsule Take 300 mg by mouth 3 (three) times daily.     guaiFENesin-dextromethorphan (ROBITUSSIN DM) 100-10 MG/5ML syrup Take 10 mLs by mouth every 4 (four) hours as needed for cough. 236 mL 0   insulin aspart protamine- aspart (NOVOLOG MIX 70/30) (70-30) 100 UNIT/ML injection Inject 50-60  Units into the skin See admin instructions. Inject 60 units into the skin in the morning and 50 units in the evening     metolazone (ZAROXOLYN) 2.5 MG tablet Take 2.5 mg by mouth daily.     ondansetron (ZOFRAN) 4 MG tablet Take 4 mg by mouth every 8 (eight) hours as needed for nausea or vomiting.     pantoprazole (PROTONIX) 40 MG tablet Take 40 mg by mouth in the morning before breakfast (Patient taking differently: Take 40 mg by mouth 2 (two) times daily.)     potassium chloride SA (KLOR-CON) 20 MEQ tablet Take 20 mEq by mouth daily.     tiZANidine (ZANAFLEX) 2 MG tablet Take 2 mg by mouth 3 (three) times daily.     torsemide (DEMADEX) 100 MG tablet Take 100 mg by mouth every morning.     traMADol (ULTRAM) 50 MG tablet Take 2 tablets (100 mg total) by mouth every 6 (six) hours as needed (for pain). 15 tablet 0   nitroGLYCERIN (NITROSTAT) 0.4 MG SL tablet Place 1 tablet (0.4 mg total) under the tongue every 5 (five) minutes as needed for chest pain. (Patient not taking: Reported on 06/19/2022) 30 tablet 0   No current facility-administered medications for this visit.    Review of Systems  Constitutional:  Constitutional negative. HENT: HENT negative.  Eyes: Positive for loss of vision.   Respiratory: Respiratory negative.  Cardiovascular: Cardiovascular negative.  GI: Gastrointestinal negative.  Musculoskeletal: Musculoskeletal negative.  Neurological: Neurological negative. Hematologic: Hematologic/lymphatic negative.  Psychiatric: Psychiatric negative.        Objective:  Objective   Vitals:   06/19/22 1219  BP: (!) 154/76  Pulse: 66  Resp: 20  Temp: 98 F (36.7 C)  SpO2: 92%  Weight: 260 lb (117.9 kg)  Height: 5\' 4"  (1.626 m)   Body mass index is 44.63 kg/m.  Physical Exam HENT:     Head: Normocephalic.     Nose: Nose normal.  Eyes:     Pupils: Pupils are equal, round, and reactive to light.  Cardiovascular:     Rate and Rhythm: Normal rate.     Pulses: Normal  pulses.  Pulmonary:     Effort: Pulmonary effort is normal.  Abdominal:     General: Abdomen is flat.  Musculoskeletal:        General: Normal range of motion.     Cervical back: Normal range of motion and neck supple.     Right lower leg: No edema.     Left lower leg: No edema.  Skin:    General: Skin is warm and dry.     Capillary Refill: Capillary refill takes less than 2 seconds.  Neurological:     General: No focal deficit present.     Mental  Status: She is alert.  Psychiatric:        Mood and Affect: Mood normal.        Behavior: Behavior normal.        Thought Content: Thought content normal.    Data: Right Cephalic   Diameter (cm)Depth (cm)Findings   +-----------------+-------------+----------+---------+  Prox upper arm       0.41        2.30              +-----------------+-------------+----------+---------+  Mid upper arm        0.48        2.00   branching  +-----------------+-------------+----------+---------+  Dist upper arm       0.58        1.60   branching  +-----------------+-------------+----------+---------+  Antecubital fossa    0.57        0.54              +-----------------+-------------+----------+---------+  Prox forearm         0.54        0.16   branching  +-----------------+-------------+----------+---------+  Mid forearm          0.38        0.46              +-----------------+-------------+----------+---------+  Dist forearm         0.44        0.37              +-----------------+-------------+----------+---------+   Right Pre-Dialysis Findings:  +-----------------------+----------+--------------------+---------+--------  +  Location               PSV (cm/s)Intralum. Diam. (cm)Waveform  Comments  +-----------------------+----------+--------------------+---------+--------  +  Brachial Antecub. fossa77        0.43                triphasic             +-----------------------+----------+--------------------+---------+--------  +  Radial Art at Wrist    95        0.19                triphasic            +-----------------------+----------+--------------------+---------+--------  +  Ulnar Art at Wrist     114       0.16                triphasic            +-----------------------+----------+--------------------+---------+--------  +     Assessment/Plan:    66 year old female now with end-stage renal disease about to start dialysis via catheter.  She is left-hand dominant.  Plan for right arm access she appears to have suitable cephalic vein.  I will remove the PD catheter since this is not usable for her given her challenges with vision.  We will get this scheduled on a nondialysis day as soon as she figures out her dialysis schedule.  We discussed the risk benefits alternatives with the patient in the presence of her daughter.     Waynetta Sandy MD [facility]

## 2022-06-26 DIAGNOSIS — N185 Chronic kidney disease, stage 5: Secondary | ICD-10-CM | POA: Diagnosis not present

## 2022-06-26 DIAGNOSIS — N186 End stage renal disease: Secondary | ICD-10-CM | POA: Diagnosis not present

## 2022-06-27 ENCOUNTER — Telehealth: Payer: Self-pay

## 2022-06-27 DIAGNOSIS — Z23 Encounter for immunization: Secondary | ICD-10-CM | POA: Insufficient documentation

## 2022-06-27 DIAGNOSIS — N2581 Secondary hyperparathyroidism of renal origin: Secondary | ICD-10-CM | POA: Diagnosis not present

## 2022-06-27 DIAGNOSIS — D649 Anemia, unspecified: Secondary | ICD-10-CM | POA: Insufficient documentation

## 2022-06-27 DIAGNOSIS — L299 Pruritus, unspecified: Secondary | ICD-10-CM | POA: Insufficient documentation

## 2022-06-27 DIAGNOSIS — Z992 Dependence on renal dialysis: Secondary | ICD-10-CM | POA: Diagnosis not present

## 2022-06-27 DIAGNOSIS — N186 End stage renal disease: Secondary | ICD-10-CM | POA: Insufficient documentation

## 2022-06-27 DIAGNOSIS — D509 Iron deficiency anemia, unspecified: Secondary | ICD-10-CM | POA: Insufficient documentation

## 2022-06-27 DIAGNOSIS — T782XXA Anaphylactic shock, unspecified, initial encounter: Secondary | ICD-10-CM | POA: Insufficient documentation

## 2022-06-27 DIAGNOSIS — E1122 Type 2 diabetes mellitus with diabetic chronic kidney disease: Secondary | ICD-10-CM | POA: Diagnosis not present

## 2022-06-27 DIAGNOSIS — T7840XS Allergy, unspecified, sequela: Secondary | ICD-10-CM | POA: Insufficient documentation

## 2022-06-27 DIAGNOSIS — R0602 Shortness of breath: Secondary | ICD-10-CM | POA: Insufficient documentation

## 2022-06-27 DIAGNOSIS — T829XXA Unspecified complication of cardiac and vascular prosthetic device, implant and graft, initial encounter: Secondary | ICD-10-CM | POA: Insufficient documentation

## 2022-06-27 NOTE — Telephone Encounter (Signed)
Patient left message that she was ready to schedule surgery for right arm AVF/AVG and removal of PD catheter with Dr. Donzetta Matters.   Attempted to reach pt to schedule. Left vm to return call.

## 2022-06-28 ENCOUNTER — Other Ambulatory Visit: Payer: Self-pay

## 2022-06-28 DIAGNOSIS — N186 End stage renal disease: Secondary | ICD-10-CM

## 2022-06-29 DIAGNOSIS — N186 End stage renal disease: Secondary | ICD-10-CM | POA: Diagnosis not present

## 2022-06-29 DIAGNOSIS — Z992 Dependence on renal dialysis: Secondary | ICD-10-CM | POA: Diagnosis not present

## 2022-06-29 DIAGNOSIS — N2581 Secondary hyperparathyroidism of renal origin: Secondary | ICD-10-CM | POA: Diagnosis not present

## 2022-07-02 DIAGNOSIS — D689 Coagulation defect, unspecified: Secondary | ICD-10-CM | POA: Insufficient documentation

## 2022-07-02 DIAGNOSIS — N2581 Secondary hyperparathyroidism of renal origin: Secondary | ICD-10-CM | POA: Diagnosis not present

## 2022-07-02 DIAGNOSIS — Z992 Dependence on renal dialysis: Secondary | ICD-10-CM | POA: Diagnosis not present

## 2022-07-02 DIAGNOSIS — N186 End stage renal disease: Secondary | ICD-10-CM | POA: Diagnosis not present

## 2022-07-04 DIAGNOSIS — N186 End stage renal disease: Secondary | ICD-10-CM | POA: Diagnosis not present

## 2022-07-04 DIAGNOSIS — N2581 Secondary hyperparathyroidism of renal origin: Secondary | ICD-10-CM | POA: Diagnosis not present

## 2022-07-04 DIAGNOSIS — Z992 Dependence on renal dialysis: Secondary | ICD-10-CM | POA: Diagnosis not present

## 2022-07-05 DIAGNOSIS — E162 Hypoglycemia, unspecified: Secondary | ICD-10-CM | POA: Insufficient documentation

## 2022-07-06 DIAGNOSIS — N186 End stage renal disease: Secondary | ICD-10-CM | POA: Diagnosis not present

## 2022-07-06 DIAGNOSIS — N2581 Secondary hyperparathyroidism of renal origin: Secondary | ICD-10-CM | POA: Diagnosis not present

## 2022-07-06 DIAGNOSIS — Z992 Dependence on renal dialysis: Secondary | ICD-10-CM | POA: Diagnosis not present

## 2022-07-06 DIAGNOSIS — I129 Hypertensive chronic kidney disease with stage 1 through stage 4 chronic kidney disease, or unspecified chronic kidney disease: Secondary | ICD-10-CM | POA: Diagnosis not present

## 2022-07-10 ENCOUNTER — Encounter (HOSPITAL_COMMUNITY): Payer: Self-pay | Admitting: Vascular Surgery

## 2022-07-10 ENCOUNTER — Other Ambulatory Visit: Payer: Self-pay

## 2022-07-10 NOTE — Progress Notes (Signed)
S.D.W- Instructions   Your procedure is scheduled on Fri., Oct. 6, 2023 from 7:30AM-9:54AM.  Report to [facility] Main Entrance "A" at 5:30 A.M., then check in with the Admitting office.  Call this number if you have problems the morning of surgery:  [phone]   Remember:  Do not eat or drink after midnight on Oct. 5th    Take these medicines the morning of surgery with A SIP OF WATER:  AmLODipine (NORVASC) Atorvastatin (LIPITOR) Carvedilol (COREG) Pantoprazole (PROTONIX) TiZANidine (ZANAFLEX)  If Needed: Acetaminophen (TYLENOL) TraMADol (ULTRAM)  As of today, STOP taking any Aspirin (unless otherwise instructed by your surgeon) Aleve, Naproxen, Ibuprofen, Motrin, Advil, Goody's, BC's, all herbal medications, fish oil, and all vitamins.  How to Manage Your Diabetes Before and After Surgery  How do I manage my blood sugar before surgery? Check your blood sugar the morning of your surgery when you wake up and every 2 hours until you get to the Short Stay unit. If your blood sugar is less than 70 mg/dL, you will need to treat for low blood sugar: Do not take insulin. Treat a low blood sugar (less than 70 mg/dL) with  cup of clear juice (cranberry or apple), 4 glucose tablets, OR glucose gel. Recheck blood sugar in 15 minutes after treatment (to make sure it is greater than 70 mg/dL). If your blood sugar is not greater than 70 mg/dL on recheck, call [phone]  for further instructions. Report your blood sugar to the short stay nurse when you get to Short Stay.  WHAT DO I DO ABOUT MY DIABETES MEDICATION?  THE NIGHT BEFORE SURGERY, take _____35______ units of _____Novolog 70/30______insulin.      If your CBG is greater than 220 mg/dL,inform the staff upon arrival to Pre-Op.  Reviewed and Endorsed by [facility] Patient Education Committee, August 2015  Do not wear jewelry or makeup. Do not wear lotions, powders, perfumes or deodorant. Do not shave 48 hours prior to  surgery.   Do not bring valuables to the hospital. Do not wear nail polish, gel polish, artificial nails, or any other type of covering on natural nails (fingers and toes) If you have artificial nails or gel coating that need to be removed by a nail salon, please have this removed prior to surgery. Artificial nails or gel coating may interfere with anesthesia's ability to adequately monitor your vital signs.  [place] is not responsible for any belongings or valuables.    Do NOT Smoke (Tobacco/Vaping)  24 hours prior to your procedure  If you use a CPAP at night, you may bring your mask for your overnight stay.   Contacts, glasses, hearing aids, dentures or partials may not be worn into surgery, please bring cases for these belongings   For patients admitted to the hospital, discharge time will be determined by your treatment team.   Patients discharged the day of surgery will not be allowed to drive home, and someone needs to stay with them for 24 hours.  Special instructions:    Oral Hygiene is also important to reduce your risk of infection.  Remember - BRUSH YOUR TEETH THE MORNING OF SURGERY WITH YOUR REGULAR TOOTHPASTE  - Preparing For Surgery  Before surgery, you can play an important role. Because skin is not sterile, your skin needs to be as free of germs as possible. You can reduce the number of germs on your skin by washing with Antibacterial Soap before surgery.     Please follow these  instructions carefully.     Shower the NIGHT BEFORE SURGERY and the MORNING OF SURGERY with Antibacterial Soap.   Pat yourself dry with a CLEAN TOWEL.  Wear CLEAN PAJAMAS to bed the night before surgery  Place CLEAN SHEETS on your bed the night before your surgery  DO NOT SLEEP WITH PETS.  Day of Surgery:  Take a shower with Antibacterial soap. Wear Clean/Comfortable clothing the morning of surgery Do not apply any deodorants/lotions.   Remember to brush your teeth  WITH YOUR REGULAR TOOTHPASTE.   If you test positive for Covid, or been in contact with anyone that has tested positive in the last 10 days, please notify your surgeon.  SURGICAL WAITING ROOM VISITATION Patients having surgery or a procedure may have no more than 2 support people in the waiting area - these visitors may rotate.   Children under the age of 39 must have an adult with them who is not the patient. If the patient needs to stay at the hospital during part of their recovery, the visitor guidelines for inpatient rooms apply. Pre-op nurse will coordinate an appropriate time for 1 support person to accompany patient in pre-op.  This support person may not rotate.   Please refer to the [facility] website for the visitor guidelines for Inpatients (after your surgery is over and you are in a regular room).

## 2022-07-10 NOTE — Progress Notes (Signed)
PCP - Dr. Kenton Kingfisher, W.  Cardiologist - Dr. [name]  EP- Denies  Endocrine- Denies  Pulm- Denies  Chest x-ray - 12/17/21 (E)  EKG - 12/17/21 (E)  Stress Test - 03/08/18 (E)  ECHO - 03/18/22 (E)  Cardiac Cath - 03/09/18 (E)  AICD-na PM-na LOOP-na  Nerve Stimulator- Denies  Dialysis- T-TH-Sa  Sleep Study - Yes- Negative, due to the pt stating that she never had a proper test. CPAP - Denies  LABS- 07/12/22: I-Stat 8  ASA- Denies  ERAS- No  HA1C- 05/20/22(CE): 9.3 Fasting Blood Sugar - 90-250 Checks Blood Sugar ___5__ times a day, pt has a continuous meter to the Right Upper arm. Pt advised to remove prior to surgery as that will be the operative side.  Anesthesia- Yes- elevated A1C  Pt denies having chest pain, sob, or fever during the pre-op phone call. All instructions explained to the pt, with a verbal understanding of the material. Pt also instructed to wear a mask and social distance if she goes out. The opportunity to ask questions was provided.

## 2022-07-11 ENCOUNTER — Ambulatory Visit: Payer: Self-pay

## 2022-07-11 DIAGNOSIS — N2581 Secondary hyperparathyroidism of renal origin: Secondary | ICD-10-CM | POA: Diagnosis not present

## 2022-07-11 DIAGNOSIS — Z992 Dependence on renal dialysis: Secondary | ICD-10-CM | POA: Diagnosis not present

## 2022-07-11 DIAGNOSIS — N186 End stage renal disease: Secondary | ICD-10-CM | POA: Diagnosis not present

## 2022-07-11 NOTE — Patient Outreach (Signed)
  Care Coordination   07/11/2022 Name: Latasha [name] MRN: [id number] DOB: 17-Nov-1955   Care Coordination Outreach Attempts:  An unsuccessful telephone outreach was attempted today to offer the patient information about available care coordination services as a benefit of their health plan.   Follow Up Plan:  Additional outreach attempts will be made to offer the patient care coordination information and services.   Encounter Outcome:  No Answer  Care Coordination Interventions Activated:  No   Care Coordination Interventions:  No, not indicated    Jone Baseman, RN, MSN [facility] Care Management Care Management Coordinator Direct Line [phone]

## 2022-07-11 NOTE — Anesthesia Preprocedure Evaluation (Addendum)
Anesthesia Evaluation  Patient identified by MRN, date of birth, ID band Patient awake    Reviewed: Allergy & Precautions, H&P , NPO status , Patient's Chart, lab work & pertinent test results  Airway Mallampati: III  TM Distance: >3 FB Neck ROM: Full    Dental no notable dental hx. (+) Partial Lower, Partial Upper, Dental Advisory Given   Pulmonary sleep apnea    Pulmonary exam normal breath sounds clear to auscultation       Cardiovascular Exercise Tolerance: Good hypertension, Pt. on medications and Pt. on home beta blockers +CHF   Rhythm:Regular Rate:Normal     Neuro/Psych negative neurological ROS  negative psych ROS   GI/Hepatic Neg liver ROS,GERD  Medicated,,  Endo/Other  diabetes, Insulin Dependent  Morbid obesity  Renal/GU ESRF and DialysisRenal disease  negative genitourinary   Musculoskeletal   Abdominal   Peds  Hematology  (+) Blood dyscrasia, anemia   Anesthesia Other Findings   Reproductive/Obstetrics negative OB ROS                             Anesthesia Physical Anesthesia Plan  ASA: 3  Anesthesia Plan: General   Post-op Pain Management: Tylenol PO (pre-op)*   Induction: Intravenous  PONV Risk Score and Plan: 4 or greater and Ondansetron, Dexamethasone and Midazolam  Airway Management Planned: Oral ETT  Additional Equipment:   Intra-op Plan:   Post-operative Plan: Extubation in OR  Informed Consent: I have reviewed the patients History and Physical, chart, labs and discussed the procedure including the risks, benefits and alternatives for the proposed anesthesia with the patient or authorized representative who has indicated his/her understanding and acceptance.     Dental advisory given  Plan Discussed with: CRNA  Anesthesia Plan Comments: (PAT note written 07/11/2022 by Myra Gianotti, PA-C. )        Anesthesia Quick Evaluation

## 2022-07-11 NOTE — Progress Notes (Signed)
Anesthesia Chart Review: Kathleene Hazel  Case: [id number] Date/Time: 07/12/22 0715   Procedures:      RIGHT ARM ARTERIOVENOUS (AV) FISTULA VERSUS ARTERIOVENOUS GRAFT CREATION (Right)     LAPAROSCOPIC REMOVAL OF CONTINUOUS AMBULATORY PERITONEAL DIALYSIS  (CAPD) CATHETER   Anesthesia type: Choice   Pre-op diagnosis: ESRD   Location: [facility] OR ROOM 12 / [facility]   Surgeons: Waynetta Sandy, MD       DISCUSSION: Patient is a 66 year old female scheduled for the above procedure. She had PD catheter placed on 05/17/22. At her 06/19/22 follow-up with Dr. Donzetta Matters, he wrote, "I recently placed a peritoneal dialysis catheter which has been working but the patient does not have the eyesight to make this work and is now considering hemodialysis." Per PAT RN interview, she does dialysis on TTS.   History includes never smoker, HTN, HLD, DM2, chronic diastolic CHF, palpitations (2015, improved with caffeine reduction, b-blocker), dyspnea, anemia, ESRD (05/2022), BPPV (2017), COVID PNA (10/2020), GI Bleed (11/2020: acute gastritis, non-bleeding duodenal ulcer), obesity. She had normal coronaries (following false positive stress test) in June 2019. A split night sleep study was ordered in May but has not been done.   A1c was 9.3% at 05/10/22 ([facility]). Last endocrinology visit was 05/29/22 with Dr. Tamala Julian.  She was checking her sugars then using a libre monitor.  She had started peritoneal dialysis.  Her insulin was increased to 65 units in the morning and 55 units in the evening with adjustments with dialysis. She reported fasting CBGs can range ~ 90-250.  She is a same-day work-up, so labs and anesthesia team to evaluate on the day of surgery.   VS: Ht 5\' 4"  (1.626 m)   Wt 102.1 kg   BMI 38.62 kg/m  BP Readings from Last 3 Encounters:  06/19/22 (!) 154/76  05/17/22 (!) 130/57  05/10/22 (!) 130/55   Pulse Readings from Last 3 Encounters:  06/19/22 66  05/17/22 64  05/10/22 66     PROVIDERS: Shirline Frees, MD is PCP  Quay Burow, MD is cardiologist. Last visit 01/22/22.  Pearson Grippe, MD is nephrologist Sherrilyn Rist, MD is pulmonologist. Last visit 04/02/22.  Iran Planas, MD is endocrinologist ([facility])   LABS: See DISCUSSION. H/H 12.6/37.0 on 05/17/22.    PFTs 04/02/22: FVC 1.67 (52%), post 1.69 (53%). FEV1 1.49 (61%), post 1.56 (64%). LDLCO unc/cor 13.85 (69%). "Pulmonary function test today shows no significant obstructive disease, no significant bronchodilator response, mild restriction with mild reduction in diffusing capacity".   EKG: 12/17/21: Sinus rhythm Borderline T wave abnormalities Minimal ST elevation, anterior leads No significant change since last tracing Confirmed by Lacretia Leigh ([id number]) on 12/17/2021 12:18:06 PM   CV: Echo 03/18/22: IMPRESSIONS   1. Left ventricular ejection fraction, by estimation, is 60 to 65%. The  left ventricle has normal function. The left ventricle has no regional  wall motion abnormalities. There is moderate concentric left ventricular  hypertrophy. Left ventricular  diastolic parameters are consistent with Grade II diastolic dysfunction  (pseudonormalization). Elevated left ventricular end-diastolic pressure.  The E/e' is 15.9.   2. Right ventricular systolic function is normal. The right ventricular  size is normal. There is normal pulmonary artery systolic pressure. The  estimated right ventricular systolic pressure is 33.2 mmHg.   3. Left atrial size was mildly dilated.   4. The mitral valve is grossly normal. Mild mitral valve regurgitation.  No evidence of mitral stenosis.   5. The aortic valve is tricuspid. Aortic valve regurgitation  is not  visualized. No aortic stenosis is present.   6. The inferior vena cava is normal in size with greater than 50%  respiratory variability, suggesting right atrial pressure of 3 mmHg.  - Comparison(s): No significant change from prior study.   Cardiac catheterization  03/09/18: IMPRESSION: Ms. Moening has normal coronary arteries and mildly elevated LVEDP.  Her 2D echo was normal.  I believe her chest pain was noncardiac and her Myoview was false positive.   Lexiscan Myoview 03/08/18 1. There is a moderate sized reversible defect in the inferior/inferolateral wall suggestive of ischemia. 2. Normal left ventricular wall motion. 3. Left ventricular ejection fraction 51% 4. Non invasive risk stratification*: Intermediate  Cardiac event monitor 01/27/14-02/10/14: "heart monitor showed normal rhythm with HR ranging from 82 to 107bpm."   Past Medical History:  Diagnosis Date   Anemia    BPPV (benign paroxysmal positional vertigo)    Chronic congestive heart failure with left ventricular diastolic dysfunction (HCC)    CKD (chronic kidney disease), stage IV ([place]) 05/15/2017   Diabetes mellitus    Dyspnea    History of blood transfusion    Hyperlipidemia    Hypertension    Left atrial enlargement    Morbid obesity with BMI of 40.0-44.9, adult ([place])    Obstructive sleep apnea 01/22/2022   Pt denies having this diagnosis, as she did not have a proper study   Palpitations    Pneumonia     Past Surgical History:  Procedure Laterality Date   ABDOMINAL HYSTERECTOMY     BIOPSY  11/09/2020   Procedure: BIOPSY;  Surgeon: Wilford Corner, MD;  Location: [facility] ENDOSCOPY;  Service: Endoscopy;;   CAPD INSERTION N/A 05/17/2022   Procedure: LAPAROSCOPIC INSERTION CONTINUOUS AMBULATORY PERITONEAL DIALYSIS CATHETER;  Surgeon: Waynetta Sandy, MD;  Location: [facility];  Service: Vascular;  Laterality: N/A;   DILATION AND CURETTAGE OF UTERUS     ESOPHAGOGASTRODUODENOSCOPY N/A 11/09/2020   Procedure: ESOPHAGOGASTRODUODENOSCOPY (EGD);  Surgeon: Wilford Corner, MD;  Location: [facility];  Service: Endoscopy;  Laterality: N/A;   EYE SURGERY     cataract/left eye, right eye had bleeding in back   IR FLUORO GUIDE CV LINE RIGHT  10/26/2020   IR US GUIDE VASC ACCESS  RIGHT  10/26/2020   KNEE SURGERY     LEFT HEART CATH AND CORONARY ANGIOGRAPHY N/A 03/09/2018   Procedure: LEFT HEART CATH AND CORONARY ANGIOGRAPHY;  Surgeon: Lorretta Harp, MD;  Location: [facility] CV LAB;  Service: Cardiovascular;  Laterality: N/A;   TRIGGER FINGER RELEASE     TUBAL LIGATION      MEDICATIONS: No current facility-administered medications for this encounter.    acetaminophen (TYLENOL) 650 MG CR tablet   amLODipine (NORVASC) 10 MG tablet   atorvastatin (LIPITOR) 40 MG tablet   carvedilol (COREG) 25 MG tablet   gabapentin (NEURONTIN) 100 MG capsule   insulin aspart protamine- aspart (NOVOLOG MIX 70/30) (70-30) 100 UNIT/ML injection   lactulose (CHRONULAC) 10 GM/15ML solution   pantoprazole (PROTONIX) 40 MG tablet   tiZANidine (ZANAFLEX) 2 MG tablet   torsemide (DEMADEX) 100 MG tablet   traMADol (ULTRAM) 50 MG tablet   budesonide-formoterol (SYMBICORT) 160-4.5 MCG/ACT inhaler   nitroGLYCERIN (NITROSTAT) 0.4 MG SL tablet    Myra Gianotti, PA-C Surgical Short Stay/Anesthesiology [facility] Phone [phone] [facility] Phone [phone] 07/11/2022 10:39 AM

## 2022-07-12 ENCOUNTER — Ambulatory Visit (HOSPITAL_BASED_OUTPATIENT_CLINIC_OR_DEPARTMENT_OTHER): Payer: Medicare HMO | Admitting: Vascular Surgery

## 2022-07-12 ENCOUNTER — Ambulatory Visit (HOSPITAL_COMMUNITY): Payer: Medicare HMO | Admitting: Vascular Surgery

## 2022-07-12 ENCOUNTER — Ambulatory Visit (HOSPITAL_COMMUNITY)
Admission: RE | Admit: 2022-07-12 | Discharge: 2022-07-12 | Disposition: A | Discharge status: Home / Self Care | Payer: Medicare HMO | Attending: Vascular Surgery | Admitting: Vascular Surgery

## 2022-07-12 ENCOUNTER — Telehealth: Payer: Self-pay

## 2022-07-12 ENCOUNTER — Encounter (HOSPITAL_COMMUNITY)
Admission: RE | Disposition: A | Discharge status: Home / Self Care | Payer: Self-pay | Source: Home / Self Care | Attending: Vascular Surgery

## 2022-07-12 ENCOUNTER — Other Ambulatory Visit: Payer: Self-pay | Admitting: Physician Assistant

## 2022-07-12 ENCOUNTER — Other Ambulatory Visit: Payer: Self-pay

## 2022-07-12 DIAGNOSIS — E1122 Type 2 diabetes mellitus with diabetic chronic kidney disease: Secondary | ICD-10-CM | POA: Diagnosis not present

## 2022-07-12 DIAGNOSIS — N186 End stage renal disease: Secondary | ICD-10-CM | POA: Diagnosis not present

## 2022-07-12 DIAGNOSIS — D631 Anemia in chronic kidney disease: Secondary | ICD-10-CM

## 2022-07-12 DIAGNOSIS — Z794 Long term (current) use of insulin: Secondary | ICD-10-CM | POA: Insufficient documentation

## 2022-07-12 DIAGNOSIS — G4730 Sleep apnea, unspecified: Secondary | ICD-10-CM | POA: Diagnosis not present

## 2022-07-12 DIAGNOSIS — Z6841 Body Mass Index (BMI) 40.0 and over, adult: Secondary | ICD-10-CM | POA: Insufficient documentation

## 2022-07-12 DIAGNOSIS — I132 Hypertensive heart and chronic kidney disease with heart failure and with stage 5 chronic kidney disease, or end stage renal disease: Secondary | ICD-10-CM | POA: Insufficient documentation

## 2022-07-12 DIAGNOSIS — I509 Heart failure, unspecified: Secondary | ICD-10-CM | POA: Diagnosis not present

## 2022-07-12 DIAGNOSIS — I5032 Chronic diastolic (congestive) heart failure: Secondary | ICD-10-CM | POA: Diagnosis not present

## 2022-07-12 DIAGNOSIS — N185 Chronic kidney disease, stage 5: Secondary | ICD-10-CM | POA: Diagnosis not present

## 2022-07-12 DIAGNOSIS — K219 Gastro-esophageal reflux disease without esophagitis: Secondary | ICD-10-CM | POA: Insufficient documentation

## 2022-07-12 DIAGNOSIS — Z992 Dependence on renal dialysis: Secondary | ICD-10-CM | POA: Diagnosis not present

## 2022-07-12 HISTORY — PX: CAPD REMOVAL: SHX5234

## 2022-07-12 HISTORY — DX: Anemia, unspecified: D64.9

## 2022-07-12 HISTORY — PX: AV FISTULA PLACEMENT: SHX1204

## 2022-07-12 LAB — POCT I-STAT, CHEM 8
BUN: 39 mg/dL — ABNORMAL HIGH (ref 8–23)
Calcium, Ion: 1 mmol/L — ABNORMAL LOW (ref 1.15–1.40)
Chloride: 98 mmol/L (ref 98–111)
Creatinine, Ser: 3.5 mg/dL — ABNORMAL HIGH (ref 0.44–1.00)
Glucose, Bld: 197 mg/dL — ABNORMAL HIGH (ref 70–99)
HCT: 30 % — ABNORMAL LOW (ref 36.0–46.0)
Hemoglobin: 10.2 g/dL — ABNORMAL LOW (ref 12.0–15.0)
Potassium: 3.9 mmol/L (ref 3.5–5.1)
Sodium: 140 mmol/L (ref 135–145)
TCO2: 27 mmol/L (ref 22–32)

## 2022-07-12 LAB — GLUCOSE, CAPILLARY
Glucose-Capillary: 182 mg/dL — ABNORMAL HIGH (ref 70–99)
Glucose-Capillary: 188 mg/dL — ABNORMAL HIGH (ref 70–99)

## 2022-07-12 SURGERY — ARTERIOVENOUS (AV) FISTULA CREATION
Anesthesia: General | Site: Arm Upper | Laterality: Right

## 2022-07-12 MED ORDER — MIDAZOLAM HCL 2 MG/2ML IJ SOLN
INTRAMUSCULAR | Status: DC | PRN
  Administered 2022-07-12: 2 mg via INTRAVENOUS

## 2022-07-12 MED ORDER — STERILE WATER FOR IRRIGATION IR SOLN
Status: DC | PRN
  Administered 2022-07-12: 500 mL

## 2022-07-12 MED ORDER — ORAL CARE MOUTH RINSE: 15.0000 mL | Freq: Once | OROMUCOSAL | Status: AC

## 2022-07-12 MED ORDER — CHLORHEXIDINE GLUCONATE 4 % EX LIQD: 60.0000 mL | Freq: Once | CUTANEOUS | Status: DC

## 2022-07-12 MED ORDER — LACTATED RINGERS IV SOLN: INTRAVENOUS | Status: DC | PRN

## 2022-07-12 MED ORDER — TRAMADOL HCL 50 MG PO TABS: 50.0000 mg | ORAL_TABLET | Freq: Four times a day (QID) | ORAL | 0 refills | Status: DC | PRN

## 2022-07-12 MED ORDER — LIDOCAINE-EPINEPHRINE (PF) 1 %-1:200000 IJ SOLN
INTRAMUSCULAR | Status: AC
  Filled 2022-07-12: qty 30

## 2022-07-12 MED ORDER — HYDROMORPHONE HCL 1 MG/ML IJ SOLN
INTRAMUSCULAR | Status: AC
  Filled 2022-07-12: qty 1

## 2022-07-12 MED ORDER — DEXAMETHASONE SODIUM PHOSPHATE 10 MG/ML IJ SOLN
INTRAMUSCULAR | Status: DC | PRN
  Administered 2022-07-12: 10 mg via INTRAVENOUS

## 2022-07-12 MED ORDER — HEPARIN 6000 UNIT IRRIGATION SOLUTION
Status: AC
  Filled 2022-07-12: qty 500

## 2022-07-12 MED ORDER — MIDAZOLAM HCL 2 MG/2ML IJ SOLN
INTRAMUSCULAR | Status: AC
  Filled 2022-07-12: qty 2

## 2022-07-12 MED ORDER — FENTANYL CITRATE (PF) 250 MCG/5ML IJ SOLN
INTRAMUSCULAR | Status: DC | PRN
  Administered 2022-07-12 (×3): 50 ug via INTRAVENOUS

## 2022-07-12 MED ORDER — LIDOCAINE 2% (20 MG/ML) 5 ML SYRINGE
INTRAMUSCULAR | Status: DC | PRN
  Administered 2022-07-12: 60 mg via INTRAVENOUS

## 2022-07-12 MED ORDER — CEFAZOLIN SODIUM-DEXTROSE 2-3 GM-%(50ML) IV SOLR
INTRAVENOUS | Status: DC | PRN
  Administered 2022-07-12: 2 g via INTRAVENOUS

## 2022-07-12 MED ORDER — FENTANYL CITRATE (PF) 250 MCG/5ML IJ SOLN
INTRAMUSCULAR | Status: AC
  Filled 2022-07-12: qty 5

## 2022-07-12 MED ORDER — PHENYLEPHRINE HCL (PRESSORS) 10 MG/ML IV SOLN
INTRAVENOUS | Status: DC | PRN
  Administered 2022-07-12: 80 ug via INTRAVENOUS
  Administered 2022-07-12: 240 ug via INTRAVENOUS

## 2022-07-12 MED ORDER — ACETAMINOPHEN 500 MG PO TABS
1000.0000 mg | ORAL_TABLET | Freq: Once | ORAL | Status: AC
  Administered 2022-07-12: 1000 mg via ORAL
  Filled 2022-07-12: qty 2

## 2022-07-12 MED ORDER — INSULIN ASPART 100 UNIT/ML IJ SOLN
0.0000 [IU] | INTRAMUSCULAR | Status: DC | PRN
  Administered 2022-07-12: 2 [IU] via SUBCUTANEOUS
  Filled 2022-07-12: qty 1

## 2022-07-12 MED ORDER — SODIUM CHLORIDE 0.9 % IV SOLN: INTRAVENOUS | Status: DC

## 2022-07-12 MED ORDER — HEPARIN 6000 UNIT IRRIGATION SOLUTION
Status: DC | PRN
  Administered 2022-07-12: 1

## 2022-07-12 MED ORDER — CEFAZOLIN SODIUM-DEXTROSE 2-4 GM/100ML-% IV SOLN
2.0000 g | INTRAVENOUS | Status: AC
  Administered 2022-07-12: 2 g via INTRAVENOUS
  Filled 2022-07-12: qty 100

## 2022-07-12 MED ORDER — PHENYLEPHRINE HCL-NACL 20-0.9 MG/250ML-% IV SOLN
INTRAVENOUS | Status: DC | PRN
  Administered 2022-07-12: 25 ug/min via INTRAVENOUS

## 2022-07-12 MED ORDER — CHLORHEXIDINE GLUCONATE 0.12 % MT SOLN
15.0000 mL | Freq: Once | OROMUCOSAL | Status: AC
  Administered 2022-07-12: 15 mL via OROMUCOSAL
  Filled 2022-07-12: qty 15

## 2022-07-12 MED ORDER — 0.9 % SODIUM CHLORIDE (POUR BTL) OPTIME
TOPICAL | Status: DC | PRN
  Administered 2022-07-12: 1000 mL

## 2022-07-12 MED ORDER — ONDANSETRON HCL 4 MG/2ML IJ SOLN
INTRAMUSCULAR | Status: DC | PRN
  Administered 2022-07-12: 4 mg via INTRAVENOUS

## 2022-07-12 MED ORDER — PROPOFOL 10 MG/ML IV BOLUS
INTRAVENOUS | Status: DC | PRN
  Administered 2022-07-12: 150 mg via INTRAVENOUS
  Administered 2022-07-12: 50 mg via INTRAVENOUS

## 2022-07-12 MED ORDER — HYDROMORPHONE HCL 1 MG/ML IJ SOLN
0.2500 mg | INTRAMUSCULAR | Status: DC | PRN
  Administered 2022-07-12: 0.25 mg via INTRAVENOUS

## 2022-07-12 SURGICAL SUPPLY — 38 items
ADH SKN CLS APL DERMABOND .7 (GAUZE/BANDAGES/DRESSINGS) ×4
ARMBAND PINK RESTRICT EXTREMIT (MISCELLANEOUS) ×3 IMPLANT
BAG COUNTER SPONGE SURGICOUNT (BAG) ×3 IMPLANT
BAG SPNG CNTER NS LX DISP (BAG) ×2
CANISTER SUCT 3000ML PPV (MISCELLANEOUS) ×3 IMPLANT
CLIP LIGATING EXTRA MED SLVR (CLIP) ×3 IMPLANT
CLIP LIGATING EXTRA SM BLUE (MISCELLANEOUS) ×3 IMPLANT
COVER PROBE W GEL 5X96 (DRAPES) IMPLANT
DERMABOND ADVANCED .7 DNX12 (GAUZE/BANDAGES/DRESSINGS) ×3 IMPLANT
DRAPE LAPAROSCOPIC ABDOMINAL (DRAPES) IMPLANT
DRSG TEGADERM 2-3/8X2-3/4 SM (GAUZE/BANDAGES/DRESSINGS) IMPLANT
ELECT REM PT RETURN 9FT ADLT (ELECTROSURGICAL) ×2
ELECTRODE REM PT RTRN 9FT ADLT (ELECTROSURGICAL) ×3 IMPLANT
GAUZE SPONGE 2X2 8PLY STRL LF (GAUZE/BANDAGES/DRESSINGS) IMPLANT
GLOVE BIO SURGEON STRL SZ 6.5 (GLOVE) IMPLANT
GLOVE BIO SURGEON STRL SZ7 (GLOVE) IMPLANT
GLOVE BIO SURGEON STRL SZ7.5 (GLOVE) ×3 IMPLANT
GLOVE INDICATOR 7.0 STRL GRN (GLOVE) IMPLANT
GOWN STRL REUS W/ TWL LRG LVL3 (GOWN DISPOSABLE) ×6 IMPLANT
GOWN STRL REUS W/ TWL XL LVL3 (GOWN DISPOSABLE) ×3 IMPLANT
GOWN STRL REUS W/TWL LRG LVL3 (GOWN DISPOSABLE) ×4
GOWN STRL REUS W/TWL XL LVL3 (GOWN DISPOSABLE) ×2
INSERT FOGARTY SM (MISCELLANEOUS) IMPLANT
KIT BASIN OR (CUSTOM PROCEDURE TRAY) ×3 IMPLANT
KIT TURNOVER KIT B (KITS) ×3 IMPLANT
LOOP VESSEL MINI RED (MISCELLANEOUS) IMPLANT
NS IRRIG 1000ML POUR BTL (IV SOLUTION) ×3 IMPLANT
PACK CV ACCESS (CUSTOM PROCEDURE TRAY) ×3 IMPLANT
PAD ARMBOARD 7.5X6 YLW CONV (MISCELLANEOUS) ×6 IMPLANT
SLING ARM FOAM STRAP LRG (SOFTGOODS) IMPLANT
SLING ARM FOAM STRAP MED (SOFTGOODS) IMPLANT
SUT MNCRL AB 4-0 PS2 18 (SUTURE) ×3 IMPLANT
SUT PROLENE 6 0 BV (SUTURE) ×3 IMPLANT
SUT VIC AB 3-0 SH 27 (SUTURE) ×2
SUT VIC AB 3-0 SH 27X BRD (SUTURE) ×3 IMPLANT
TOWEL GREEN STERILE (TOWEL DISPOSABLE) ×3 IMPLANT
UNDERPAD 30X36 HEAVY ABSORB (UNDERPADS AND DIAPERS) ×3 IMPLANT
WATER STERILE IRR 1000ML POUR (IV SOLUTION) ×3 IMPLANT

## 2022-07-12 NOTE — Discharge Instructions (Signed)
   [facility]  Discharge Instructions  AV Fistula or Graft Surgery for Dialysis Access  Please refer to the following instructions for your post-procedure care. Your surgeon or physician assistant will discuss any changes with you.  Activity  You may drive the day following your surgery, if you are comfortable and no longer taking prescription pain medication. Resume full activity as the soreness in your incision resolves.  Bathing/Showering  You may shower after you go home. Keep your incision dry for 48 hours. Do not soak in a bathtub, hot tub, or swim until the incision heals completely. You may not shower if you have a hemodialysis catheter.  Incision Care  Clean your incision with mild soap and water after 48 hours. Pat the area dry with a clean towel. You do not need a bandage unless otherwise instructed. Do not apply any ointments or creams to your incision. You may have skin glue on your incision. Do not peel it off. It will come off on its own in about one week. Your arm may swell a bit after surgery. To reduce swelling use pillows to elevate your arm so it is above your heart. Your doctor will tell you if you need to lightly wrap your arm with an ACE bandage.  Diet  Resume your normal diet. There are not special food restrictions following this procedure. In order to heal from your surgery, it is CRITICAL to get adequate nutrition. Your body requires vitamins, minerals, and protein. Vegetables are the best source of vitamins and minerals. Vegetables also provide the perfect balance of protein. Processed food has little nutritional value, so try to avoid this.  Medications  Resume taking all of your medications. If your incision is causing pain, you may take over-the counter pain relievers such as acetaminophen (Tylenol). If you were prescribed a stronger pain medication, please be aware these medications can cause nausea and constipation. Prevent  nausea by taking the medication with a snack or meal. Avoid constipation by drinking plenty of fluids and eating foods with high amount of fiber, such as fruits, vegetables, and grains.  Do not take Tylenol if you are taking prescription pain medications.  Follow up Your surgeon may want to see you in the office following your access surgery. If so, this will be arranged at the time of your surgery.  Please call us immediately for any of the following conditions:  Increased pain, redness, drainage (pus) from your incision site Fever of 101 degrees or higher Severe or worsening pain at your incision site Hand pain or numbness.  Reduce your risk of vascular disease:  Stop smoking. If you would like help, call [facility] at 1-800-QUIT-NOW (419)042-8535) or [place] at [place] your cholesterol Maintain a desired weight Control your diabetes Keep your blood pressure down  Dialysis  It will take several weeks to several months for your new dialysis access to be ready for use. Your surgeon will determine when it is okay to use it. Your nephrologist will continue to direct your dialysis. You can continue to use your Permcath until your new access is ready for use.   07/12/2022 Latasha [name] [id number] 09-28-1956  Surgeon(s): Waynetta Sandy, MD  Procedure(s): RIGHT ARM ARTERIOVENOUS (AV) FISTULA CREATION REMOVAL OF CONTINUOUS AMBULATORY PERITONEAL DIALYSIS  (CAPD) CATHETER  x Do not stick fistula for 12 weeks    If you have any questions, please call the office at [phone].

## 2022-07-12 NOTE — Op Note (Signed)
    Patient name: Latasha [name] MRN: [id number] DOB: 05/10/1956 Sex: female  07/12/2022 Pre-operative Diagnosis: esrd Post-operative diagnosis:  Same Surgeon:  Erlene Quan C. Donzetta Matters, MD Assistants: Leontine Locket, PA; Leatha Gilding, MS3 Procedure Performed: 1.  Right arm brachial artery to cephalic vein AV fistula creation 2.  Removal of peritoneal dialysis catheter  Indications: 66 year old female with end-stage renal disease currently on dialysis via catheter.  She has previous peritoneal dialysis catheter placed but cannot use it due to visual impairment.  She is now indicated for fistula creation versus graft she is left-hand dominant.  Findings: Cephalic vein near the antecubital measured approximately 5 mm although this did shrink down just above that joint with a larger branch but after connection to the brachial artery there was pulsatility that could be traced with Doppler of the upper arm with a very mild Doppler signal.  The entirety of the PD catheter was removed the fascia was not closed due to the depth.   Procedure:  The patient was identified in the holding area and taken to the operating room where she is placed supine operative table and LMA anesthesia was induced.  She was sterilely prepped and draped in the right upper extremity and abdomen the usual fashion, antibiotics were minister timeout was called.  Ultrasound was used to identify the cephalic vein as well as the brachial artery at the antecubitum and the cephalic vein was traced throughout the upper arm.  A transverse incision was made just below the antecubital we dissected out the vein marked for orientation dividing branches between clips and ties.  We dissected to the deep fascia the brachial artery placed a vessel loop around this.  The cephalic vein was then clamped distally transected tied off flush with heparinized saline spatulated.  The artery was clamped distally and proximally opened longitudinally and flushed with  heparinized saline and distal direction only.  The vein was then sewn into side with 6-0 Prolene suture.  Breakaway sheath flushed in all directions.  Upon completion we then freed up some of the soft tissue around to check with Doppler there was good signal was also a strong radial artery signal at the wrist that did not seem to augment with compression of the fistula.  The wound was irrigated hemostasis was obtained closed in layers with Vicryl and Monocryl and Dermabond was placed at the skin level.  Attention was then turned to the abdomen.  I first dissected down after identifying the first of the cuffs with ultrasound.  We opened the previous incision dissected down as close as I could to the fascia identified the fistula and remove this from the abdomen.  I had to make an additional incision to release the more cephalad 2 cuffs.  After this the entire catheter was removed there was no residual left.  The wounds were irrigated hemostasis obtained and we closed in layers with Vicryl Monocryl and again Dermabond was placed at the skin level.  She was then awakened from anesthesia having tolerated procedure without any complication.  All counts were correct at completion.  EBL: 50cc   Odarius Dines C. Donzetta Matters, MD [facility] [place] Office: [phone] Pager: [phone]

## 2022-07-12 NOTE — Transfer of Care (Signed)
Immediate Anesthesia Transfer of Care Note  Patient: Latasha [name]  Procedure(s) Performed: RIGHT ARM ARTERIOVENOUS (AV) FISTULA CREATION (Right: Arm Upper) REMOVAL OF CONTINUOUS AMBULATORY PERITONEAL DIALYSIS  (CAPD) CATHETER (Right: Abdomen)  Patient Location: PACU  Anesthesia Type:General  Level of Consciousness: awake, alert  and oriented  Airway & Oxygen Therapy: Patient Spontanous Breathing  Post-op Assessment: Report given to RN and Post -op Vital signs reviewed and stable  Post vital signs: Reviewed and stable  Last Vitals:  Vitals Value Taken Time  BP    Temp 36.9 C 07/12/22 0900  Pulse 74 07/12/22 0907  Resp 11 07/12/22 0907  SpO2 93 % 07/12/22 0907  Vitals shown include unvalidated device data.  Last Pain:  Vitals:   07/12/22 0620  TempSrc:   PainSc: 0-No pain      Patients Stated Pain Goal: 3 (88/33/74 4514)  Complications: No notable events documented.

## 2022-07-12 NOTE — Interval H&P Note (Signed)
History and Physical Interval Note:  07/12/2022 7:25 AM  Latasha [name]  has presented today for surgery, with the diagnosis of ESRD.  The various methods of treatment have been discussed with the patient and family. After consideration of risks, benefits and other options for treatment, the patient has consented to  Procedure(s): RIGHT ARM ARTERIOVENOUS (AV) FISTULA VERSUS ARTERIOVENOUS GRAFT CREATION (Right) LAPAROSCOPIC REMOVAL OF CONTINUOUS AMBULATORY PERITONEAL DIALYSIS  (CAPD) CATHETER (N/A) as a surgical intervention.  The patient's history has been reviewed, patient examined, no change in status, stable for surgery.  I have reviewed the patient's chart and labs.  Questions were answered to the patient's satisfaction.     Servando Snare

## 2022-07-12 NOTE — Anesthesia Procedure Notes (Signed)
Procedure Name: LMA Insertion Date/Time: 07/12/2022 7:45 AM  Performed by: Minerva Ends, CRNAPre-anesthesia Checklist: Patient identified, Emergency Drugs available, Suction available and Patient being monitored Patient Re-evaluated:Patient Re-evaluated prior to induction Oxygen Delivery Method: Circle system utilized Preoxygenation: Pre-oxygenation with 100% oxygen Induction Type: IV induction Ventilation: Mask ventilation without difficulty LMA: LMA inserted LMA Size: 4.0 Tube type: Oral Number of attempts: 1 Placement Confirmation: positive ETCO2 and breath sounds checked- equal and bilateral Tube secured with: Tape Dental Injury: Teeth and Oropharynx as per pre-operative assessment

## 2022-07-12 NOTE — Telephone Encounter (Signed)
Pt called requesting pain meds following her surgery today.  Reviewed pt's chart, spoke with [facility] PA, returned call for clarification, no answer, lf vm.  Pt called, informed her that the PA sent in some Tramadol. She was unable to get any Oxy or Hydrocodone d/t allergies. Pt instructed to take Tylenol in addition. Confirmed understanding.

## 2022-07-12 NOTE — Anesthesia Postprocedure Evaluation (Signed)
Anesthesia Post Note  Patient: KERRIANNE JENG  Procedure(s) Performed: RIGHT ARM ARTERIOVENOUS (AV) FISTULA CREATION (Right: Arm Upper) REMOVAL OF CONTINUOUS AMBULATORY PERITONEAL DIALYSIS  (CAPD) CATHETER (Right: Abdomen)     Patient location during evaluation: PACU Anesthesia Type: General Level of consciousness: awake and alert Pain management: pain level controlled Vital Signs Assessment: post-procedure vital signs reviewed and stable Respiratory status: spontaneous breathing, nonlabored ventilation and respiratory function stable Cardiovascular status: blood pressure returned to baseline and stable Postop Assessment: no apparent nausea or vomiting Anesthetic complications: no   No notable events documented.  Last Vitals:  Vitals:   07/12/22 0930 07/12/22 0945  BP: 135/60 (!) 143/65  Pulse: 68 66  Resp: 14 12  Temp:  36.9 C  SpO2: 100% 100%    Last Pain:  Vitals:   07/12/22 0945  TempSrc:   PainSc: 2                  Sallye Lunz,W. EDMOND

## 2022-07-13 ENCOUNTER — Encounter (HOSPITAL_COMMUNITY): Payer: Self-pay | Admitting: Vascular Surgery

## 2022-07-13 DIAGNOSIS — N2581 Secondary hyperparathyroidism of renal origin: Secondary | ICD-10-CM | POA: Diagnosis not present

## 2022-07-13 DIAGNOSIS — Z992 Dependence on renal dialysis: Secondary | ICD-10-CM | POA: Diagnosis not present

## 2022-07-13 DIAGNOSIS — N186 End stage renal disease: Secondary | ICD-10-CM | POA: Diagnosis not present

## 2022-07-16 ENCOUNTER — Telehealth: Payer: Self-pay | Admitting: Physician Assistant

## 2022-07-16 DIAGNOSIS — N186 End stage renal disease: Secondary | ICD-10-CM | POA: Diagnosis not present

## 2022-07-16 DIAGNOSIS — N2581 Secondary hyperparathyroidism of renal origin: Secondary | ICD-10-CM | POA: Diagnosis not present

## 2022-07-16 DIAGNOSIS — Z992 Dependence on renal dialysis: Secondary | ICD-10-CM | POA: Diagnosis not present

## 2022-07-16 NOTE — Telephone Encounter (Signed)
-----   Message from Gabriel Earing, [place] sent at 07/12/2022  8:59 AM EDT ----- S/p right BC AVF 10/6.  F/u in 6 weeks with duplex on [facility] clinic day.  Thanks

## 2022-07-20 DIAGNOSIS — N2581 Secondary hyperparathyroidism of renal origin: Secondary | ICD-10-CM | POA: Diagnosis not present

## 2022-07-20 DIAGNOSIS — N186 End stage renal disease: Secondary | ICD-10-CM | POA: Diagnosis not present

## 2022-07-20 DIAGNOSIS — Z992 Dependence on renal dialysis: Secondary | ICD-10-CM | POA: Diagnosis not present

## 2022-07-22 ENCOUNTER — Encounter (HOSPITAL_COMMUNITY): Payer: Self-pay

## 2022-07-23 ENCOUNTER — Ambulatory Visit: Payer: Self-pay

## 2022-07-23 DIAGNOSIS — N186 End stage renal disease: Secondary | ICD-10-CM | POA: Diagnosis not present

## 2022-07-23 DIAGNOSIS — Z992 Dependence on renal dialysis: Secondary | ICD-10-CM | POA: Diagnosis not present

## 2022-07-23 DIAGNOSIS — N2581 Secondary hyperparathyroidism of renal origin: Secondary | ICD-10-CM | POA: Diagnosis not present

## 2022-07-23 NOTE — Patient Outreach (Signed)
  Care Coordination   07/23/2022 Name: Latasha [name] MRN: [id number] DOB: [birth year]   Care Coordination Outreach Attempts:  A second unsuccessful outreach was attempted today to offer the patient with information about available care coordination services as a benefit of their health plan.     Follow Up Plan:  Additional outreach attempts will be made to offer the patient care coordination information and services.   Encounter Outcome:  No Answer  Care Coordination Interventions Activated:  No   Care Coordination Interventions:  No, not indicated    Jone Baseman, RN, MSN [facility] Care Management Care Management Coordinator Direct Line [phone]

## 2022-07-24 ENCOUNTER — Telehealth: Payer: Self-pay

## 2022-07-24 NOTE — Telephone Encounter (Signed)
Pt called with c/o R hand vein "throbbing and swollen". She has been elevating it and using ice. Pt has been scheduled with APP tomorrow and advised to use a warm compress.

## 2022-07-24 NOTE — Progress Notes (Unsigned)
    Postoperative Access Visit   History of Present Illness   Latasha [name] is a 66 y.o. year old female who presents for postoperative follow-up for: Right arm brachial artery to cephalic vein AV fistula creation and Removal of peritoneal dialysis catheter by Dr. Donzetta Matters on 07/12/22. She was unable to continue PD due to visual impairment so she was indicated for an upper arm fistula. She presents today with concerns of throbbing vein in her right hand post operatively. The patient's wounds are *** healed.  The patient notes *** steal symptoms.  The patient is *** able to complete their activities of daily living.  The patient's current symptoms are: ***.   Physical Examination  There were no vitals filed for this visit. There is no height or weight on file to calculate BMI.  right arm Incision is *** healed, *** radial pulse, hand grip is ***/5, sensation in digits is *** intact, ***palpable thrill, bruit can *** be auscultated     Medical Decision Making   Latasha [name] is a 66 y.o. year old female who presents s/p Right arm brachial artery to cephalic vein AV fistula creation and Removal of peritoneal dialysis catheter by Dr. Donzetta Matters on 07/12/22.  Patent *** without signs or symptoms of steal syndrome The patient's access will be ready for use *** ***The patient's tunneled dialysis catheter can be removed when Nephrology is comfortable with the performance of the *** The patient may follow up on a prn basis   Karoline Caldwell, PA-C [facility] [place]: [facility] MD: Donzetta Matters

## 2022-07-25 ENCOUNTER — Ambulatory Visit (INDEPENDENT_AMBULATORY_CARE_PROVIDER_SITE_OTHER): Payer: Medicare HMO | Admitting: Physician Assistant

## 2022-07-25 VITALS — BP 150/80 | HR 72 | Resp 20 | Ht 64.0 in | Wt 262.4 lb

## 2022-07-25 DIAGNOSIS — M79641 Pain in right hand: Secondary | ICD-10-CM

## 2022-07-25 DIAGNOSIS — E11319 Type 2 diabetes mellitus with unspecified diabetic retinopathy without macular edema: Secondary | ICD-10-CM | POA: Insufficient documentation

## 2022-07-25 DIAGNOSIS — R600 Localized edema: Secondary | ICD-10-CM | POA: Insufficient documentation

## 2022-07-25 DIAGNOSIS — Z9889 Other specified postprocedural states: Secondary | ICD-10-CM

## 2022-07-25 DIAGNOSIS — R309 Painful micturition, unspecified: Secondary | ICD-10-CM | POA: Diagnosis not present

## 2022-07-25 DIAGNOSIS — N3000 Acute cystitis without hematuria: Secondary | ICD-10-CM | POA: Diagnosis not present

## 2022-07-25 DIAGNOSIS — E7800 Pure hypercholesterolemia, unspecified: Secondary | ICD-10-CM | POA: Insufficient documentation

## 2022-07-25 DIAGNOSIS — H8110 Benign paroxysmal vertigo, unspecified ear: Secondary | ICD-10-CM | POA: Insufficient documentation

## 2022-07-25 DIAGNOSIS — N186 End stage renal disease: Secondary | ICD-10-CM

## 2022-07-27 DIAGNOSIS — N2581 Secondary hyperparathyroidism of renal origin: Secondary | ICD-10-CM | POA: Diagnosis not present

## 2022-07-27 DIAGNOSIS — Z992 Dependence on renal dialysis: Secondary | ICD-10-CM | POA: Diagnosis not present

## 2022-07-27 DIAGNOSIS — N186 End stage renal disease: Secondary | ICD-10-CM | POA: Diagnosis not present

## 2022-07-30 DIAGNOSIS — N2581 Secondary hyperparathyroidism of renal origin: Secondary | ICD-10-CM | POA: Diagnosis not present

## 2022-07-30 DIAGNOSIS — Z992 Dependence on renal dialysis: Secondary | ICD-10-CM | POA: Diagnosis not present

## 2022-07-30 DIAGNOSIS — N186 End stage renal disease: Secondary | ICD-10-CM | POA: Diagnosis not present

## 2022-07-31 ENCOUNTER — Ambulatory Visit: Payer: Self-pay

## 2022-07-31 NOTE — Patient Instructions (Signed)
Visit Information  Thank you for taking time to visit with me today. Please don't hesitate to contact me if I can be of assistance to you.   Following are the goals we discussed today:   Goals Addressed             This Visit's Progress    Maintain Hemodialysis       Care Coordination Interventions: Ongoing Hemodialysis Evaluation of current treatment plan related to chronic kidney disease self management and patient's adherence to plan as established by provider      Reviewed prescribed diet Renal Discussed plans with patient for ongoing care management follow up and provided patient with direct contact information for care management team    Discussed the impact of chronic kidney disease on daily life and mental health and acknowledged and normalized feelings of disempowerment, fear, and frustration     Patient adjusting to hemodialysis [address]  A1c 8.?          Our next appointment is by telephone on 08/21/22 at 12noon  Please call the care guide team at [phone] if you need to cancel or reschedule your appointment.   If you are experiencing a Mental Health or [place] or need someone to talk to, please call the Suicide and Crisis Lifeline: 988   Patient verbalizes understanding of instructions and care plan provided today and agrees to view in [place]. Active MyChart status and patient understanding of how to access instructions and care plan via MyChart confirmed with patient.     Telephone follow up appointment with care management team member scheduled URK:[id number]  Jone Baseman, RN, MSN [place] [facility] Care Management Coordinator Direct Line [phone]

## 2022-07-31 NOTE — Patient Outreach (Signed)
  Care Coordination   Follow Up Visit Note   07/31/2022 Name: Latasha [name] MRN: [id number] DOB: 1956-07-09  Latasha [name] is a 66 y.o. year old female who sees Shirline Frees, MD for primary care. I spoke with  Latasha [name] by phone today.  What matters to the patients health and wellness today?  Maintaining hemodialysis    Goals Addressed             This Visit's Progress    Maintain Hemodialysis       Care Coordination Interventions: Ongoing Hemodialysis Evaluation of current treatment plan related to chronic kidney disease self management and patient's adherence to plan as established by provider      Reviewed prescribed diet Renal Discussed plans with patient for ongoing care management follow up and provided patient with direct contact information for care management team    Discussed the impact of chronic kidney disease on daily life and mental health and acknowledged and normalized feelings of disempowerment, fear, and frustration     Patient adjusting to hemodialysis [address]  A1c 8.?          SDOH assessments and interventions completed:  Yes     Care Coordination Interventions Activated:  Yes  Care Coordination Interventions:  Yes, provided   Follow up plan: Follow up call scheduled for November    Encounter Outcome:  Pt. Visit Completed   Jone Baseman, RN, MSN [place] [facility] Care Management Coordinator Direct Line [phone]

## 2022-08-01 DIAGNOSIS — N186 End stage renal disease: Secondary | ICD-10-CM | POA: Diagnosis not present

## 2022-08-01 DIAGNOSIS — Z992 Dependence on renal dialysis: Secondary | ICD-10-CM | POA: Diagnosis not present

## 2022-08-01 DIAGNOSIS — N2581 Secondary hyperparathyroidism of renal origin: Secondary | ICD-10-CM | POA: Diagnosis not present

## 2022-08-02 DIAGNOSIS — Z992 Dependence on renal dialysis: Secondary | ICD-10-CM | POA: Diagnosis not present

## 2022-08-02 DIAGNOSIS — I10 Essential (primary) hypertension: Secondary | ICD-10-CM | POA: Diagnosis not present

## 2022-08-02 DIAGNOSIS — R6889 Other general symptoms and signs: Secondary | ICD-10-CM | POA: Diagnosis not present

## 2022-08-02 DIAGNOSIS — Z23 Encounter for immunization: Secondary | ICD-10-CM | POA: Diagnosis not present

## 2022-08-02 DIAGNOSIS — N186 End stage renal disease: Secondary | ICD-10-CM | POA: Diagnosis not present

## 2022-08-02 DIAGNOSIS — E1140 Type 2 diabetes mellitus with diabetic neuropathy, unspecified: Secondary | ICD-10-CM | POA: Diagnosis not present

## 2022-08-02 DIAGNOSIS — Z1331 Encounter for screening for depression: Secondary | ICD-10-CM | POA: Diagnosis not present

## 2022-08-02 DIAGNOSIS — Z1211 Encounter for screening for malignant neoplasm of colon: Secondary | ICD-10-CM | POA: Diagnosis not present

## 2022-08-02 DIAGNOSIS — H9312 Tinnitus, left ear: Secondary | ICD-10-CM | POA: Diagnosis not present

## 2022-08-06 DIAGNOSIS — I129 Hypertensive chronic kidney disease with stage 1 through stage 4 chronic kidney disease, or unspecified chronic kidney disease: Secondary | ICD-10-CM | POA: Diagnosis not present

## 2022-08-06 DIAGNOSIS — Z992 Dependence on renal dialysis: Secondary | ICD-10-CM | POA: Diagnosis not present

## 2022-08-06 DIAGNOSIS — N186 End stage renal disease: Secondary | ICD-10-CM | POA: Diagnosis not present

## 2022-08-06 DIAGNOSIS — N2581 Secondary hyperparathyroidism of renal origin: Secondary | ICD-10-CM | POA: Diagnosis not present

## 2022-08-08 DIAGNOSIS — N186 End stage renal disease: Secondary | ICD-10-CM | POA: Diagnosis not present

## 2022-08-08 DIAGNOSIS — N2581 Secondary hyperparathyroidism of renal origin: Secondary | ICD-10-CM | POA: Diagnosis not present

## 2022-08-08 DIAGNOSIS — Z992 Dependence on renal dialysis: Secondary | ICD-10-CM | POA: Diagnosis not present

## 2022-08-10 DIAGNOSIS — N186 End stage renal disease: Secondary | ICD-10-CM | POA: Diagnosis not present

## 2022-08-10 DIAGNOSIS — Z992 Dependence on renal dialysis: Secondary | ICD-10-CM | POA: Diagnosis not present

## 2022-08-10 DIAGNOSIS — N2581 Secondary hyperparathyroidism of renal origin: Secondary | ICD-10-CM | POA: Diagnosis not present

## 2022-08-13 DIAGNOSIS — N2581 Secondary hyperparathyroidism of renal origin: Secondary | ICD-10-CM | POA: Diagnosis not present

## 2022-08-13 DIAGNOSIS — N186 End stage renal disease: Secondary | ICD-10-CM | POA: Diagnosis not present

## 2022-08-13 DIAGNOSIS — Z992 Dependence on renal dialysis: Secondary | ICD-10-CM | POA: Diagnosis not present

## 2022-08-15 DIAGNOSIS — N2581 Secondary hyperparathyroidism of renal origin: Secondary | ICD-10-CM | POA: Diagnosis not present

## 2022-08-15 DIAGNOSIS — N186 End stage renal disease: Secondary | ICD-10-CM | POA: Diagnosis not present

## 2022-08-15 DIAGNOSIS — Z992 Dependence on renal dialysis: Secondary | ICD-10-CM | POA: Diagnosis not present

## 2022-08-17 DIAGNOSIS — N186 End stage renal disease: Secondary | ICD-10-CM | POA: Diagnosis not present

## 2022-08-17 DIAGNOSIS — N2581 Secondary hyperparathyroidism of renal origin: Secondary | ICD-10-CM | POA: Diagnosis not present

## 2022-08-17 DIAGNOSIS — Z992 Dependence on renal dialysis: Secondary | ICD-10-CM | POA: Diagnosis not present

## 2022-08-20 ENCOUNTER — Other Ambulatory Visit: Payer: Self-pay | Admitting: *Deleted

## 2022-08-20 DIAGNOSIS — N186 End stage renal disease: Secondary | ICD-10-CM

## 2022-08-21 ENCOUNTER — Ambulatory Visit: Payer: Self-pay

## 2022-08-21 NOTE — Patient Outreach (Signed)
  Care Coordination   08/21/2022 Name: Latasha [name] MRN: [id number] DOB: 1955/10/18   Care Coordination Outreach Attempts:  An unsuccessful telephone outreach was attempted today to offer the patient information about available care coordination services as a benefit of their health plan.   Follow Up Plan:  Additional outreach attempts will be made to offer the patient care coordination information and services.   Encounter Outcome:  No Answer  Care Coordination Interventions Activated:  Yes   Care Coordination Interventions:  Yes, provided    Jone Baseman, RN, MSN [place] [facility] Care Management Coordinator Direct Line [phone]

## 2022-08-22 DIAGNOSIS — Z992 Dependence on renal dialysis: Secondary | ICD-10-CM | POA: Diagnosis not present

## 2022-08-22 DIAGNOSIS — N186 End stage renal disease: Secondary | ICD-10-CM | POA: Diagnosis not present

## 2022-08-22 DIAGNOSIS — N2581 Secondary hyperparathyroidism of renal origin: Secondary | ICD-10-CM | POA: Diagnosis not present

## 2022-08-23 ENCOUNTER — Ambulatory Visit: Payer: Medicare HMO | Admitting: Podiatry

## 2022-08-24 DIAGNOSIS — N186 End stage renal disease: Secondary | ICD-10-CM | POA: Diagnosis not present

## 2022-08-24 DIAGNOSIS — N2581 Secondary hyperparathyroidism of renal origin: Secondary | ICD-10-CM | POA: Diagnosis not present

## 2022-08-24 DIAGNOSIS — Z992 Dependence on renal dialysis: Secondary | ICD-10-CM | POA: Diagnosis not present

## 2022-08-26 DIAGNOSIS — N186 End stage renal disease: Secondary | ICD-10-CM | POA: Diagnosis not present

## 2022-08-26 DIAGNOSIS — Z992 Dependence on renal dialysis: Secondary | ICD-10-CM | POA: Diagnosis not present

## 2022-08-26 DIAGNOSIS — N2581 Secondary hyperparathyroidism of renal origin: Secondary | ICD-10-CM | POA: Diagnosis not present

## 2022-08-28 ENCOUNTER — Other Ambulatory Visit: Payer: Self-pay

## 2022-08-28 ENCOUNTER — Ambulatory Visit (INDEPENDENT_AMBULATORY_CARE_PROVIDER_SITE_OTHER): Payer: Medicare HMO | Admitting: Physician Assistant

## 2022-08-28 ENCOUNTER — Ambulatory Visit (HOSPITAL_COMMUNITY)
Admission: RE | Admit: 2022-08-28 | Discharge: 2022-08-28 | Disposition: A | Discharge status: Home / Self Care | Payer: Medicare HMO | Source: Ambulatory Visit | Attending: Vascular Surgery | Admitting: Vascular Surgery

## 2022-08-28 ENCOUNTER — Encounter: Payer: Self-pay | Admitting: Physician Assistant

## 2022-08-28 VITALS — BP 180/83 | HR 68 | Temp 97.6°F | Ht 64.0 in | Wt 265.0 lb

## 2022-08-28 DIAGNOSIS — N186 End stage renal disease: Secondary | ICD-10-CM | POA: Diagnosis not present

## 2022-08-28 DIAGNOSIS — R6889 Other general symptoms and signs: Secondary | ICD-10-CM | POA: Diagnosis not present

## 2022-08-28 NOTE — H&P (View-Only) (Signed)
    Postoperative Access Visit   History of Present Illness   Latasha [name] is a 66 y.o. year old female who presents for postoperative follow-up for: Right arm brachial artery to cephalic vein AV fistula creation and Removal of peritoneal dialysis catheter by Dr. Donzetta Matters on 07/12/22. She was unable to continue PD due to visual impairment so she was indicated for an upper arm fistula.  I saw her several weeks ago with concerns of pain in her right hand in are vein. That has since resolved. The patient's wounds are well healed.  The patient notes no steal symptoms. The patient is able to complete their activities of daily living. She does report recent issues at her dialysis sessions with hypotension and severe cramping in her ands and abdomen that has been requiring her to not complete her full treatments.   Has a right IJ TDC and dialyzes on TTS at  [facility] on [address]   Physical Examination   Vitals:   08/28/22 1441  BP: (!) 180/83  Pulse: 68  Temp: 97.6 F (36.4 C)  TempSrc: Temporal  SpO2: 96%  Weight: 265 lb (120.2 kg)  Height: 5\' 4"  (1.626 m)   Body mass index is 45.49 kg/m.  right arm Incision is well healed, 2+ radial pulse, hand grip is 5/5, sensation in digits is intact, no palpable thrill, bruit cannot be auscultated     Medical Decision Making   Latasha [name] is a 66 y.o. year old female who presents s/p Right arm brachial artery to cephalic vein AV fistula creation and Removal of peritoneal dialysis catheter by Dr. Donzetta Matters on 07/12/22. Her incision is well healed. Patent is without signs or symptoms of steal syndrome. Her duplex today shows that her fistula is now occluded. I think this is likely secondary to her hypotensive episodes at dialysis. She will now likely require right Basilic vein fistula vs. AV graft. The patient is aware the risks include but are not limited to: bleeding, infection, steal syndrome, nerve damage, thrombosis, failure to mature, and need for  additional procedures. The patient agrees to proceed with the procedure. I will arrange this with Dr. Donzetta Matters in the near future.    Karoline Caldwell, PA-C [facility] [place] Office: [phone]  Clinic MD: Yetta Barre

## 2022-08-28 NOTE — Progress Notes (Signed)
    Postoperative Access Visit   History of Present Illness   Latasha [name] is a 66 y.o. year old female who presents for postoperative follow-up for: Right arm brachial artery to cephalic vein AV fistula creation and Removal of peritoneal dialysis catheter by Dr. Donzetta Matters on 07/12/22. She was unable to continue PD due to visual impairment so she was indicated for an upper arm fistula.  I saw her several weeks ago with concerns of pain in her right hand in are vein. That has since resolved. The patient's wounds are well healed.  The patient notes no steal symptoms. The patient is able to complete their activities of daily living. She does report recent issues at her dialysis sessions with hypotension and severe cramping in her ands and abdomen that has been requiring her to not complete her full treatments.   Has a right IJ TDC and dialyzes on TTS at  [facility] on [address]   Physical Examination   Vitals:   08/28/22 1441  BP: (!) 180/83  Pulse: 68  Temp: 97.6 F (36.4 C)  TempSrc: Temporal  SpO2: 96%  Weight: 265 lb (120.2 kg)  Height: 5\' 4"  (1.626 m)   Body mass index is 45.49 kg/m.  right arm Incision is well healed, 2+ radial pulse, hand grip is 5/5, sensation in digits is intact, no palpable thrill, bruit cannot be auscultated     Medical Decision Making   Latasha [name] is a 66 y.o. year old female who presents s/p Right arm brachial artery to cephalic vein AV fistula creation and Removal of peritoneal dialysis catheter by Dr. Donzetta Matters on 07/12/22. Her incision is well healed. Patent is without signs or symptoms of steal syndrome. Her duplex today shows that her fistula is now occluded. I think this is likely secondary to her hypotensive episodes at dialysis. She will now likely require right Basilic vein fistula vs. AV graft. The patient is aware the risks include but are not limited to: bleeding, infection, steal syndrome, nerve damage, thrombosis, failure to mature, and need for  additional procedures. The patient agrees to proceed with the procedure. I will arrange this with Dr. Donzetta Matters in the near future.    Karoline Caldwell, PA-C [facility] [place] Office: [phone]  Clinic MD: Yetta Barre

## 2022-08-31 DIAGNOSIS — N2581 Secondary hyperparathyroidism of renal origin: Secondary | ICD-10-CM | POA: Diagnosis not present

## 2022-08-31 DIAGNOSIS — Z992 Dependence on renal dialysis: Secondary | ICD-10-CM | POA: Diagnosis not present

## 2022-08-31 DIAGNOSIS — N186 End stage renal disease: Secondary | ICD-10-CM | POA: Diagnosis not present

## 2022-09-03 DIAGNOSIS — N186 End stage renal disease: Secondary | ICD-10-CM | POA: Diagnosis not present

## 2022-09-03 DIAGNOSIS — N2581 Secondary hyperparathyroidism of renal origin: Secondary | ICD-10-CM | POA: Diagnosis not present

## 2022-09-03 DIAGNOSIS — Z992 Dependence on renal dialysis: Secondary | ICD-10-CM | POA: Diagnosis not present

## 2022-09-05 DIAGNOSIS — Z992 Dependence on renal dialysis: Secondary | ICD-10-CM | POA: Diagnosis not present

## 2022-09-05 DIAGNOSIS — N186 End stage renal disease: Secondary | ICD-10-CM | POA: Diagnosis not present

## 2022-09-05 DIAGNOSIS — I129 Hypertensive chronic kidney disease with stage 1 through stage 4 chronic kidney disease, or unspecified chronic kidney disease: Secondary | ICD-10-CM | POA: Diagnosis not present

## 2022-09-05 DIAGNOSIS — N2581 Secondary hyperparathyroidism of renal origin: Secondary | ICD-10-CM | POA: Diagnosis not present

## 2022-09-09 ENCOUNTER — Ambulatory Visit: Payer: Self-pay

## 2022-09-09 NOTE — Patient Outreach (Signed)
  Care Coordination   09/09/2022 Name: Latasha [name] MRN: [id number] DOB: [birth year]   Care Coordination Outreach Attempts:  A second unsuccessful outreach was attempted today to offer the patient with information about available care coordination services as a benefit of their health plan.     Follow Up Plan:  Additional outreach attempts will be made to offer the patient care coordination information and services.   Encounter Outcome:  No Answer   Care Coordination Interventions:  No, not indicated    Jone Baseman, RN, MSN [place] [facility] Care Management Coordinator Direct Line [phone]

## 2022-09-10 DIAGNOSIS — Z992 Dependence on renal dialysis: Secondary | ICD-10-CM | POA: Diagnosis not present

## 2022-09-10 DIAGNOSIS — N2581 Secondary hyperparathyroidism of renal origin: Secondary | ICD-10-CM | POA: Diagnosis not present

## 2022-09-10 DIAGNOSIS — N186 End stage renal disease: Secondary | ICD-10-CM | POA: Diagnosis not present

## 2022-09-12 DIAGNOSIS — N2581 Secondary hyperparathyroidism of renal origin: Secondary | ICD-10-CM | POA: Diagnosis not present

## 2022-09-12 DIAGNOSIS — N186 End stage renal disease: Secondary | ICD-10-CM | POA: Diagnosis not present

## 2022-09-12 DIAGNOSIS — Z992 Dependence on renal dialysis: Secondary | ICD-10-CM | POA: Diagnosis not present

## 2022-09-13 ENCOUNTER — Ambulatory Visit: Payer: Medicare HMO | Admitting: Podiatry

## 2022-09-13 DIAGNOSIS — M79674 Pain in right toe(s): Secondary | ICD-10-CM

## 2022-09-13 DIAGNOSIS — Z794 Long term (current) use of insulin: Secondary | ICD-10-CM

## 2022-09-13 DIAGNOSIS — M79675 Pain in left toe(s): Secondary | ICD-10-CM | POA: Diagnosis not present

## 2022-09-13 DIAGNOSIS — E119 Type 2 diabetes mellitus without complications: Secondary | ICD-10-CM | POA: Diagnosis not present

## 2022-09-13 DIAGNOSIS — B351 Tinea unguium: Secondary | ICD-10-CM | POA: Diagnosis not present

## 2022-09-13 DIAGNOSIS — R6889 Other general symptoms and signs: Secondary | ICD-10-CM | POA: Diagnosis not present

## 2022-09-13 NOTE — Progress Notes (Signed)
  Subjective:  Patient ID: Latasha [name], female    DOB: July 18, 1956,  MRN: [id number]  Chief Complaint  Patient presents with   Diabetes   66 y.o. female returns for the above complaint.  Patient presents with thickened elongated dystrophic toenails x 10 mild pain on palpation hurts with ambulation worse with pressure.  She would like to have them debrided down she is not able to do it herself.  Denies any other acute complaint  Objective:  There were no vitals filed for this visit. Podiatric Exam: Vascular: dorsalis pedis and posterior tibial pulses are palpable bilateral. Capillary return is immediate. Temperature gradient is WNL. Skin turgor WNL  Sensorium: Normal Semmes Weinstein monofilament test. Normal tactile sensation bilaterally. Nail Exam: Pt has thick disfigured discolored nails with subungual debris noted bilateral entire nail hallux through fifth toenails.  Pain on palpation to the nails. Ulcer Exam: There is no evidence of ulcer or pre-ulcerative changes or infection. Orthopedic Exam: Muscle tone and strength are WNL. No limitations in general ROM. No crepitus or effusions noted.  Skin: No Porokeratosis. No infection or ulcers    Assessment & Plan:   1. Pain due to onychomycosis of toenails of both feet   2. Insulin-requiring or dependent type II diabetes mellitus ([place])     Patient was evaluated and treated and all questions answered.  Onychomycosis with pain  -Nails palliatively debrided as below. -Educated on self-care  Procedure: Nail Debridement Rationale: pain  Type of Debridement: manual, sharp debridement. Instrumentation: Nail nipper, rotary burr. Number of Nails: 10  Procedures and Treatment: Consent by patient was obtained for treatment procedures. The patient understood the discussion of treatment and procedures well. All questions were answered thoroughly reviewed. Debridement of mycotic and hypertrophic toenails, 1 through 5 bilateral and clearing of  subungual debris. No ulceration, no infection noted.  Return Visit-Office Procedure: Patient instructed to return to the office for a follow up visit 3 months for continued evaluation and treatment.  Boneta Lucks, DPM    Return in about 3 months (around 12/13/2022).

## 2022-09-14 DIAGNOSIS — N2581 Secondary hyperparathyroidism of renal origin: Secondary | ICD-10-CM | POA: Diagnosis not present

## 2022-09-14 DIAGNOSIS — Z992 Dependence on renal dialysis: Secondary | ICD-10-CM | POA: Diagnosis not present

## 2022-09-14 DIAGNOSIS — N186 End stage renal disease: Secondary | ICD-10-CM | POA: Diagnosis not present

## 2022-09-17 DIAGNOSIS — N2581 Secondary hyperparathyroidism of renal origin: Secondary | ICD-10-CM | POA: Diagnosis not present

## 2022-09-17 DIAGNOSIS — N186 End stage renal disease: Secondary | ICD-10-CM | POA: Diagnosis not present

## 2022-09-17 DIAGNOSIS — Z992 Dependence on renal dialysis: Secondary | ICD-10-CM | POA: Diagnosis not present

## 2022-09-19 DIAGNOSIS — N2581 Secondary hyperparathyroidism of renal origin: Secondary | ICD-10-CM | POA: Diagnosis not present

## 2022-09-19 DIAGNOSIS — N186 End stage renal disease: Secondary | ICD-10-CM | POA: Diagnosis not present

## 2022-09-19 DIAGNOSIS — Z992 Dependence on renal dialysis: Secondary | ICD-10-CM | POA: Diagnosis not present

## 2022-09-23 ENCOUNTER — Ambulatory Visit: Payer: Self-pay

## 2022-09-23 NOTE — Patient Outreach (Signed)
  Care Coordination   Follow Up Visit Note   09/23/2022 Name: Latasha [name] MRN: [id number] DOB: 04/22/1956  Latasha [name] is a 66 y.o. year old female who sees Shirline Frees, MD for primary care. I spoke with  Latasha [name] by phone today.  What matters to the patients health and wellness today?  Maintaining health    Goals Addressed             This Visit's Progress    Maintain Hemodialysis       Care Coordination Interventions: Ongoing Hemodialysis Evaluation of current treatment plan related to chronic kidney disease self management and patient's adherence to plan as established by provider      Reviewed prescribed diet Renal Discussed plans with patient for ongoing care management follow up and provided patient with direct contact information for care management team    Discussed the impact of chronic kidney disease on daily life and mental health and acknowledged and normalized feelings of disempowerment, fear, and frustration     Patient adjusting to hemodialysis [address]   Blood sugars in better control 130-140 in the AM.  Discussed diabetes management.          SDOH assessments and interventions completed:  Yes     Care Coordination Interventions:  Yes, provided   Follow up plan: Follow up call scheduled for January    Encounter Outcome:  Pt. Visit Completed   Jone Baseman, RN, MSN [place] [facility] Care Management Coordinator Direct Line [phone]

## 2022-09-23 NOTE — Patient Instructions (Signed)
Visit Information  Thank you for taking time to visit with me today. Please don't hesitate to contact me if I can be of assistance to you.   Following are the goals we discussed today:   Goals Addressed             This Visit's Progress    Maintain Hemodialysis       Care Coordination Interventions: Ongoing Hemodialysis Evaluation of current treatment plan related to chronic kidney disease self management and patient's adherence to plan as established by provider      Reviewed prescribed diet Renal Discussed plans with patient for ongoing care management follow up and provided patient with direct contact information for care management team    Discussed the impact of chronic kidney disease on daily life and mental health and acknowledged and normalized feelings of disempowerment, fear, and frustration     Patient adjusting to hemodialysis [address]   Blood sugars in better control 130-140 in the AM.  Discussed diabetes management.          Our next appointment is by telephone on 10/18/22 at 1130  Please call the care guide team at [phone] if you need to cancel or reschedule your appointment.   If you are experiencing a Mental Health or [place] or need someone to talk to, please call the Suicide and Crisis Lifeline: 988   Patient verbalizes understanding of instructions and care plan provided today and agrees to view in [place]. Active MyChart status and patient understanding of how to access instructions and care plan via MyChart confirmed with patient.     Telephone follow up appointment with care management team member scheduled for: January  Clodagh Odenthal J Macklin Jacquin, RN, MSN [place] [facility] Care Management Coordinator Direct Line [phone]

## 2022-09-24 DIAGNOSIS — N186 End stage renal disease: Secondary | ICD-10-CM | POA: Diagnosis not present

## 2022-09-24 DIAGNOSIS — Z992 Dependence on renal dialysis: Secondary | ICD-10-CM | POA: Diagnosis not present

## 2022-09-24 DIAGNOSIS — N2581 Secondary hyperparathyroidism of renal origin: Secondary | ICD-10-CM | POA: Diagnosis not present

## 2022-09-26 ENCOUNTER — Encounter (HOSPITAL_COMMUNITY): Payer: Self-pay | Admitting: Vascular Surgery

## 2022-09-26 ENCOUNTER — Other Ambulatory Visit: Payer: Self-pay

## 2022-09-26 DIAGNOSIS — Z992 Dependence on renal dialysis: Secondary | ICD-10-CM | POA: Diagnosis not present

## 2022-09-26 DIAGNOSIS — N186 End stage renal disease: Secondary | ICD-10-CM | POA: Diagnosis not present

## 2022-09-26 DIAGNOSIS — N2581 Secondary hyperparathyroidism of renal origin: Secondary | ICD-10-CM | POA: Diagnosis not present

## 2022-09-26 NOTE — Progress Notes (Signed)
Mrs Latasha [name] denies chest pain or shortness of breath.  Patient denies having any s/s of Covid in her household, also denies any known exposure to Covid.  Mrs Latasha [name] ' PCP is Dr. Rogers Blocker, patient has type II diabetes.  Mrs Latasha [name] reports that CBGs have been in the 200's recently. I instructed Mrs. Latasha [name] that if CBG is greater in am to take 70% of 70/30 Insulin+ 35 units. I instructed patient to check CBG after awaking and every 2 hours until arrival  to the hospital.  I Instructed patient if CBG is less than 70 to take 4 Glucose Tablets or 1 tube of Glucose Gel or 1/2 cup of a clear juice. Recheck CBG in 15 minutes if CBG is not over 70 call, pre- op desk at [phone] for further instructions. If scheduled to receive Insulin, do not take Insulin.  Mrs. Latasha [name]' cardiologist is Dr. [name].

## 2022-09-27 ENCOUNTER — Ambulatory Visit (HOSPITAL_COMMUNITY): Payer: Medicare HMO | Admitting: Anesthesiology

## 2022-09-27 ENCOUNTER — Encounter (HOSPITAL_COMMUNITY): Payer: Self-pay | Admitting: Vascular Surgery

## 2022-09-27 ENCOUNTER — Ambulatory Visit (HOSPITAL_BASED_OUTPATIENT_CLINIC_OR_DEPARTMENT_OTHER): Payer: Medicare HMO | Admitting: Anesthesiology

## 2022-09-27 ENCOUNTER — Ambulatory Visit (HOSPITAL_COMMUNITY)
Admission: RE | Admit: 2022-09-27 | Discharge: 2022-09-27 | Disposition: A | Discharge status: Home / Self Care | Payer: Medicare HMO | Attending: Vascular Surgery | Admitting: Vascular Surgery

## 2022-09-27 ENCOUNTER — Encounter (HOSPITAL_COMMUNITY)
Admission: RE | Disposition: A | Discharge status: Home / Self Care | Payer: Self-pay | Source: Home / Self Care | Attending: Vascular Surgery

## 2022-09-27 DIAGNOSIS — N186 End stage renal disease: Secondary | ICD-10-CM

## 2022-09-27 DIAGNOSIS — T85868A Thrombosis due to other internal prosthetic devices, implants and grafts, initial encounter: Secondary | ICD-10-CM | POA: Diagnosis not present

## 2022-09-27 DIAGNOSIS — T82898A Other specified complication of vascular prosthetic devices, implants and grafts, initial encounter: Secondary | ICD-10-CM | POA: Diagnosis not present

## 2022-09-27 DIAGNOSIS — T82868A Thrombosis of vascular prosthetic devices, implants and grafts, initial encounter: Secondary | ICD-10-CM | POA: Diagnosis not present

## 2022-09-27 DIAGNOSIS — I120 Hypertensive chronic kidney disease with stage 5 chronic kidney disease or end stage renal disease: Secondary | ICD-10-CM | POA: Diagnosis not present

## 2022-09-27 DIAGNOSIS — Z6841 Body Mass Index (BMI) 40.0 and over, adult: Secondary | ICD-10-CM | POA: Diagnosis not present

## 2022-09-27 DIAGNOSIS — Z794 Long term (current) use of insulin: Secondary | ICD-10-CM | POA: Insufficient documentation

## 2022-09-27 DIAGNOSIS — E1122 Type 2 diabetes mellitus with diabetic chronic kidney disease: Secondary | ICD-10-CM | POA: Diagnosis not present

## 2022-09-27 DIAGNOSIS — Z992 Dependence on renal dialysis: Secondary | ICD-10-CM | POA: Insufficient documentation

## 2022-09-27 HISTORY — PX: THROMBECTOMY W/ EMBOLECTOMY: SHX2507

## 2022-09-27 HISTORY — PX: AV FISTULA PLACEMENT: SHX1204

## 2022-09-27 HISTORY — DX: Personal history of other medical treatment: Z92.89

## 2022-09-27 LAB — POCT I-STAT, CHEM 8
BUN: 32 mg/dL — ABNORMAL HIGH (ref 8–23)
Calcium, Ion: 1.1 mmol/L — ABNORMAL LOW (ref 1.15–1.40)
Chloride: 98 mmol/L (ref 98–111)
Creatinine, Ser: 2.6 mg/dL — ABNORMAL HIGH (ref 0.44–1.00)
Glucose, Bld: 234 mg/dL — ABNORMAL HIGH (ref 70–99)
HCT: 34 % — ABNORMAL LOW (ref 36.0–46.0)
Hemoglobin: 11.6 g/dL — ABNORMAL LOW (ref 12.0–15.0)
Potassium: 3.8 mmol/L (ref 3.5–5.1)
Sodium: 140 mmol/L (ref 135–145)
TCO2: 29 mmol/L (ref 22–32)

## 2022-09-27 LAB — GLUCOSE, CAPILLARY
Glucose-Capillary: 203 mg/dL — ABNORMAL HIGH (ref 70–99)
Glucose-Capillary: 210 mg/dL — ABNORMAL HIGH (ref 70–99)
Glucose-Capillary: 192 mg/dL — ABNORMAL HIGH (ref 70–99)

## 2022-09-27 SURGERY — ARTERIOVENOUS (AV) FISTULA CREATION
Anesthesia: General | Site: Arm Upper | Laterality: Right

## 2022-09-27 MED ORDER — ACETAMINOPHEN 10 MG/ML IV SOLN: 1000.0000 mg | Freq: Once | INTRAVENOUS | Status: DC | PRN

## 2022-09-27 MED ORDER — SUCCINYLCHOLINE CHLORIDE 200 MG/10ML IV SOSY
PREFILLED_SYRINGE | INTRAVENOUS | Status: DC | PRN
  Administered 2022-09-27: 140 mg via INTRAVENOUS

## 2022-09-27 MED ORDER — INSULIN ASPART 100 UNIT/ML IJ SOLN
0.0000 [IU] | INTRAMUSCULAR | Status: DC | PRN
  Administered 2022-09-27: 2 [IU] via SUBCUTANEOUS
  Filled 2022-09-27: qty 1

## 2022-09-27 MED ORDER — CEFAZOLIN IN SODIUM CHLORIDE 3-0.9 GM/100ML-% IV SOLN
3.0000 g | INTRAVENOUS | Status: AC
  Administered 2022-09-27: 2 g via INTRAVENOUS
  Filled 2022-09-27: qty 100

## 2022-09-27 MED ORDER — ORAL CARE MOUTH RINSE: 15.0000 mL | Freq: Once | OROMUCOSAL | Status: AC

## 2022-09-27 MED ORDER — MIDAZOLAM HCL 2 MG/2ML IJ SOLN
INTRAMUSCULAR | Status: DC | PRN
  Administered 2022-09-27 (×2): 1 mg via INTRAVENOUS

## 2022-09-27 MED ORDER — 0.9 % SODIUM CHLORIDE (POUR BTL) OPTIME
TOPICAL | Status: DC | PRN
  Administered 2022-09-27: 1000 mL

## 2022-09-27 MED ORDER — FENTANYL CITRATE (PF) 100 MCG/2ML IJ SOLN: 25.0000 ug | INTRAMUSCULAR | Status: DC | PRN

## 2022-09-27 MED ORDER — PROPOFOL 10 MG/ML IV BOLUS
INTRAVENOUS | Status: DC | PRN
  Administered 2022-09-27: 30 mg via INTRAVENOUS
  Administered 2022-09-27: 120 mg via INTRAVENOUS

## 2022-09-27 MED ORDER — SUGAMMADEX SODIUM 200 MG/2ML IV SOLN
INTRAVENOUS | Status: DC | PRN
  Administered 2022-09-27: 100 mg via INTRAVENOUS

## 2022-09-27 MED ORDER — FENTANYL CITRATE (PF) 250 MCG/5ML IJ SOLN
INTRAMUSCULAR | Status: DC | PRN
  Administered 2022-09-27 (×5): 50 ug via INTRAVENOUS

## 2022-09-27 MED ORDER — FENTANYL CITRATE (PF) 250 MCG/5ML IJ SOLN
INTRAMUSCULAR | Status: AC
  Filled 2022-09-27: qty 5

## 2022-09-27 MED ORDER — SODIUM CHLORIDE 0.9 % IV SOLN: INTRAVENOUS | Status: DC

## 2022-09-27 MED ORDER — PROPOFOL 10 MG/ML IV BOLUS
INTRAVENOUS | Status: AC
  Filled 2022-09-27: qty 20

## 2022-09-27 MED ORDER — LACTATED RINGERS IV SOLN: INTRAVENOUS | Status: DC

## 2022-09-27 MED ORDER — ROCURONIUM BROMIDE 10 MG/ML (PF) SYRINGE
PREFILLED_SYRINGE | INTRAVENOUS | Status: DC | PRN
  Administered 2022-09-27: 30 mg via INTRAVENOUS

## 2022-09-27 MED ORDER — HEPARIN 6000 UNIT IRRIGATION SOLUTION
Status: DC | PRN
  Administered 2022-09-27: 1

## 2022-09-27 MED ORDER — LIDOCAINE 2% (20 MG/ML) 5 ML SYRINGE
INTRAMUSCULAR | Status: DC | PRN
  Administered 2022-09-27: 100 mg via INTRAVENOUS

## 2022-09-27 MED ORDER — EPHEDRINE SULFATE-NACL 50-0.9 MG/10ML-% IV SOSY
PREFILLED_SYRINGE | INTRAVENOUS | Status: DC | PRN
  Administered 2022-09-27: 5 mg via INTRAVENOUS

## 2022-09-27 MED ORDER — PHENYLEPHRINE HCL-NACL 20-0.9 MG/250ML-% IV SOLN
INTRAVENOUS | Status: DC | PRN
  Administered 2022-09-27: 50 ug/min via INTRAVENOUS

## 2022-09-27 MED ORDER — GLYCOPYRROLATE 0.2 MG/ML IJ SOLN
INTRAMUSCULAR | Status: DC | PRN
  Administered 2022-09-27 (×2): .1 mg via INTRAVENOUS

## 2022-09-27 MED ORDER — HEPARIN 6000 UNIT IRRIGATION SOLUTION
Status: AC
  Filled 2022-09-27: qty 500

## 2022-09-27 MED ORDER — ONDANSETRON HCL 4 MG/2ML IJ SOLN: 4.0000 mg | Freq: Once | INTRAMUSCULAR | Status: DC | PRN

## 2022-09-27 MED ORDER — CHLORHEXIDINE GLUCONATE 4 % EX LIQD: 60.0000 mL | Freq: Once | CUTANEOUS | Status: DC

## 2022-09-27 MED ORDER — MIDAZOLAM HCL 2 MG/2ML IJ SOLN
INTRAMUSCULAR | Status: AC
  Filled 2022-09-27: qty 2

## 2022-09-27 MED ORDER — ONDANSETRON HCL 4 MG/2ML IJ SOLN
INTRAMUSCULAR | Status: DC | PRN
  Administered 2022-09-27: 4 mg via INTRAVENOUS

## 2022-09-27 MED ORDER — CHLORHEXIDINE GLUCONATE 0.12 % MT SOLN
15.0000 mL | Freq: Once | OROMUCOSAL | Status: AC
  Administered 2022-09-27: 15 mL via OROMUCOSAL
  Filled 2022-09-27: qty 15

## 2022-09-27 MED ORDER — TRAMADOL HCL 50 MG PO TABS: 50.0000 mg | ORAL_TABLET | Freq: Four times a day (QID) | ORAL | 0 refills | Status: DC | PRN

## 2022-09-27 SURGICAL SUPPLY — 35 items
ADH SKN CLS APL DERMABOND .7 (GAUZE/BANDAGES/DRESSINGS) ×1
ARMBAND PINK RESTRICT EXTREMIT (MISCELLANEOUS) ×2 IMPLANT
BAG COUNTER SPONGE SURGICOUNT (BAG) ×2 IMPLANT
BAG SPNG CNTER NS LX DISP (BAG) ×1
CANISTER SUCT 3000ML PPV (MISCELLANEOUS) ×2 IMPLANT
CATH EMB 3FR 40CM (CATHETERS) IMPLANT
CLIP LIGATING EXTRA MED SLVR (CLIP) ×2 IMPLANT
CLIP LIGATING EXTRA SM BLUE (MISCELLANEOUS) ×2 IMPLANT
COVER PROBE W GEL 5X96 (DRAPES) IMPLANT
DERMABOND ADVANCED .7 DNX12 (GAUZE/BANDAGES/DRESSINGS) ×2 IMPLANT
ELECT REM PT RETURN 9FT ADLT (ELECTROSURGICAL) ×1
ELECTRODE REM PT RTRN 9FT ADLT (ELECTROSURGICAL) ×2 IMPLANT
GLOVE BIO SURGEON STRL SZ7.5 (GLOVE) ×2 IMPLANT
GOWN STRL REUS W/ TWL LRG LVL3 (GOWN DISPOSABLE) ×4 IMPLANT
GOWN STRL REUS W/ TWL XL LVL3 (GOWN DISPOSABLE) ×2 IMPLANT
GOWN STRL REUS W/TWL LRG LVL3 (GOWN DISPOSABLE) ×2
GOWN STRL REUS W/TWL XL LVL3 (GOWN DISPOSABLE) ×1
INSERT FOGARTY SM (MISCELLANEOUS) IMPLANT
KIT BASIN OR (CUSTOM PROCEDURE TRAY) ×2 IMPLANT
KIT TURNOVER KIT B (KITS) ×2 IMPLANT
NS IRRIG 1000ML POUR BTL (IV SOLUTION) ×2 IMPLANT
PACK CV ACCESS (CUSTOM PROCEDURE TRAY) ×2 IMPLANT
PAD ARMBOARD 7.5X6 YLW CONV (MISCELLANEOUS) ×4 IMPLANT
POWDER SURGICEL 3.0 GRAM (HEMOSTASIS) IMPLANT
SLING ARM FOAM STRAP LRG (SOFTGOODS) IMPLANT
SLING ARM FOAM STRAP MED (SOFTGOODS) IMPLANT
SPONGE T-LAP 18X18 ~~LOC~~+RFID (SPONGE) IMPLANT
SUT MNCRL AB 4-0 PS2 18 (SUTURE) ×2 IMPLANT
SUT PROLENE 6 0 BV (SUTURE) ×2 IMPLANT
SUT VIC AB 3-0 SH 27 (SUTURE) ×1
SUT VIC AB 3-0 SH 27X BRD (SUTURE) ×2 IMPLANT
SYR 3ML LL SCALE MARK (SYRINGE) IMPLANT
TOWEL GREEN STERILE (TOWEL DISPOSABLE) ×2 IMPLANT
UNDERPAD 30X36 HEAVY ABSORB (UNDERPADS AND DIAPERS) ×2 IMPLANT
WATER STERILE IRR 1000ML POUR (IV SOLUTION) ×2 IMPLANT

## 2022-09-27 NOTE — Discharge Instructions (Signed)
° °  [facility]  ° °Discharge Instructions ° °AV Fistula or Graft Surgery for Dialysis Access ° °Please refer to the following instructions for your post-procedure care. Your surgeon or physician assistant will discuss any changes with you. ° °Activity ° °You may drive the day following your surgery, if you are comfortable and no longer taking prescription pain medication. Resume full activity as the soreness in your incision resolves. ° °Bathing/Showering ° °You may shower after you go home. Keep your incision dry for 48 hours. Do not soak in a bathtub, hot tub, or swim until the incision heals completely. You may not shower if you have a hemodialysis catheter. ° °Incision Care ° °Clean your incision with mild soap and water after 48 hours. Pat the area dry with a clean towel. You do not need a bandage unless otherwise instructed. Do not apply any ointments or creams to your incision. You may have skin glue on your incision. Do not peel it off. It will come off on its own in about one week. Your arm may swell a bit after surgery. To reduce swelling use pillows to elevate your arm so it is above your heart. Your doctor will tell you if you need to lightly wrap your arm with an ACE bandage. ° °Diet ° °Resume your normal diet. There are not special food restrictions following this procedure. In order to heal from your surgery, it is CRITICAL to get adequate nutrition. Your body requires vitamins, minerals, and protein. Vegetables are the best source of vitamins and minerals. Vegetables also provide the perfect balance of protein. Processed food has little nutritional value, so try to avoid this. ° °Medications ° °Resume taking all of your medications. If your incision is causing pain, you may take over-the counter pain relievers such as acetaminophen (Tylenol). If you were prescribed a stronger pain medication, please be aware these medications can cause nausea and constipation. Prevent  nausea by taking the medication with a snack or meal. Avoid constipation by drinking plenty of fluids and eating foods with high amount of fiber, such as fruits, vegetables, and grains. Do not take Tylenol if you are taking prescription pain medications. ° ° ° ° °Follow up °Your surgeon may want to see you in the office following your access surgery. If so, this will be arranged at the time of your surgery. ° °Please call us immediately for any of the following conditions: ° °Increased pain, redness, drainage (pus) from your incision site °Fever of 101 degrees or higher °Severe or worsening pain at your incision site °Hand pain or numbness. ° °Reduce your risk of vascular disease: ° °Stop smoking. If you would like help, call [facility] at 1-800-QUIT-NOW (1-800-784-8669) or [place] at 336-586-4000 ° °Manage your cholesterol °Maintain a desired weight °Control your diabetes °Keep your blood pressure down ° °Dialysis ° °It will take several weeks to several months for your new dialysis access to be ready for use. Your surgeon will determine when it is OK to use it. Your nephrologist will continue to direct your dialysis. You can continue to use your Permcath until your new access is ready for use. ° °If you have any questions, please call the office at [phone]. ° °

## 2022-09-27 NOTE — Transfer of Care (Signed)
Immediate Anesthesia Transfer of Care Note  Patient: TYREONA PANJWANI  Procedure(s) Performed: RIGHT UPPER EXTREMITY ARTERIOVENOUS (AV) FISTULA (Right: Arm Upper) THROMBECTOMY ARTERIOVENOUS FISTULA (Right: Arm Upper)  Patient Location: PACU  Anesthesia Type:General  Level of Consciousness: awake and patient cooperative  Airway & Oxygen Therapy: Patient Spontanous Breathing and Patient connected to face mask oxygen  Post-op Assessment: Report given to RN, Post -op Vital signs reviewed and stable, and Patient moving all extremities  Post vital signs: Reviewed and stable  Last Vitals:  Vitals Value Taken Time  BP 151/61 09/27/22 1003  Temp    Pulse 69 09/27/22 1004  Resp 16 09/27/22 1004  SpO2 96 % 09/27/22 1004  Vitals shown include unvalidated device data.  Last Pain:  Vitals:   09/27/22 0703  TempSrc:   PainSc: 0-No pain         Complications: No notable events documented.

## 2022-09-27 NOTE — Anesthesia Procedure Notes (Signed)
Procedure Name: Intubation Date/Time: 09/27/2022 8:14 AM  Performed by: Elvin So, CRNAPre-anesthesia Checklist: Patient identified, Emergency Drugs available, Suction available and Patient being monitored Patient Re-evaluated:Patient Re-evaluated prior to induction Oxygen Delivery Method: Circle System Utilized Preoxygenation: Pre-oxygenation with 100% oxygen Induction Type: IV induction Ventilation: Mask ventilation without difficulty Laryngoscope Size: Mac and 3 Grade View: Grade I Tube type: Oral Tube size: 7.0 mm Number of attempts: 1 Airway Equipment and Method: Stylet and Oral airway Placement Confirmation: ETT inserted through vocal cords under direct vision, positive ETCO2 and breath sounds checked- equal and bilateral Secured at: 22 cm Tube secured with: Tape Dental Injury: Teeth and Oropharynx as per pre-operative assessment  Comments: LMA 5 and LMA 4 failed to seat. Excessive leaking around both with attempts by CRNA and MD. Successful intubation with one attempt by CRNA. Easy mask

## 2022-09-27 NOTE — Op Note (Signed)
Patient name: Latasha [name] MRN: [id number] DOB: [birth year] Sex: female  09/27/2022 Pre-operative Diagnosis: End-stage renal disease Post-operative diagnosis:  Same Surgeon:  Erlene Quan C. Donzetta Matters, MD Assistant: Arlee Muslim, PA Procedure Performed:  Revision of right arm brachial artery to cephalic vein fistula with connection to new branch of the cephalic vein and thrombectomy of existing cephalic vein fistula   Indications: 66 year old female with a history of end-stage renal disease dialyzing via catheter.  She previously had a peritoneal dialysis catheter placed which was removed due to visual impairment and then subsequently had a right brachial artery to cephalic vein fistula created.  She is left-hand dominant.  The cephalic vein fistula unfortunately has thrombosed by recent ultrasound.  She is medicated for new right upper extremity access.  Findings: Cephalic vein fistula was occluded proximally and appeared to have been chronically occluded.  I performed thrombectomy of this but could not return really any significant blood flow.  I did find a larger cephalic vein branch laterally and ultimately was able to transpose this over and create a new fistula at the site of the old fistula.  At completion there was a palpable radial artery pulse at the wrist and a strong signal in the ulnar artery at the wrist and strong Doppler flow throughout the cephalic vein in the upper arm.    The cephalic vein higher in the right upper arm was somewhat deep and will likely require transposition versus superficialization in the future.   Procedure:  The patient was identified in the holding area and taken to the operating room where she is placed supine operative table and anesthesia was induced.  She was sterilely prepped and draped in the right upper extremity usual fashion, antibiotics were administered and a timeout was called.  I began using ultrasound to identify what appeared to be an occluded  cephalic vein fistula but laterally there was a larger branch of the cephalic vein.  I made an incision above the antecubitum and dissected down to the both the fistula and the larger branch and I dissected the branch out for several centimeters and this was quite large and I marked this for orientation.  I then clamped the fistula distally and proximally and transected this and tied it off more cephalad.  There was no flow through this.  I first attempted endarterectomy of this and then attempted thrombectomy and I was able to get the Fogarty to pass but could never really establish any strong inflow.  With this I traced the cephalic vein further distally and made a vertical incision on the forearm to fully dissected out the cephalic vein for several centimeters.  Branches were divided between clips and ties and then again marked the distal part now for orientation.  I opened the previous incision although only on the medial aspect and dissected down to the brachial artery into the cephalic vein fistula creation.  I was able to get full exposure of the brachial artery bifurcation where I could clamp the tube vessels distally the radial and ulnar arteries as well as the brachial artery proximally.  I then transected the fistula off of the artery back to healthy appearing artery and I had good backbleeding antegrade bleeding and I flushed the artery in both directions with heparinized saline.  I then transected the cephalic vein distally in the forearm and tied this off distally and then flushed with heparinized saline tunneled to the old incision and then transected this and spatulated.  I then  sewed this into side to the artery with 6-0 Prolene suture.  Prior to completion we did allow flushing in all directions and then thoroughly irrigated with heparinized saline.  Upon completion there was a very strong thrill in the fistula itself and a palpable radial artery pulse at the wrist confirmed with Doppler and also a  strong ulnar signal at the wrist confirmed with Doppler.  We irrigated all the wounds and obtain hemostasis and then closed in layers with Vicryl and Monocryl.  The patient was then awakened from anesthesia having tolerated procedure well without immediate complication.  All counts were correct at completion.  EBL: 50 cc     Letrell Attwood C. Donzetta Matters, MD [facility] [place] Office: [phone] Pager: [phone]

## 2022-09-27 NOTE — Anesthesia Postprocedure Evaluation (Signed)
Anesthesia Post Note  Patient: Latasha [name]  Procedure(s) Performed: RIGHT UPPER EXTREMITY ARTERIOVENOUS (AV) FISTULA (Right: Arm Upper) THROMBECTOMY ARTERIOVENOUS FISTULA (Right: Arm Upper)     Patient location during evaluation: PACU Anesthesia Type: General Level of consciousness: awake and alert Pain management: pain level controlled Vital Signs Assessment: post-procedure vital signs reviewed and stable Respiratory status: spontaneous breathing, nonlabored ventilation, respiratory function stable and patient connected to nasal cannula oxygen Cardiovascular status: blood pressure returned to baseline and stable Postop Assessment: no apparent nausea or vomiting Anesthetic complications: no  No notable events documented.  Last Vitals:  Vitals:   09/27/22 1004 09/27/22 1015  BP: (!) 151/61 (!) 119/50  Pulse: 69 69  Resp: 16 14  Temp: (!) 36.2 C   SpO2: 96% 97%    Last Pain:  Vitals:   09/27/22 1030  TempSrc:   PainSc: 0-No pain                 Nohealani Medinger S

## 2022-09-27 NOTE — Anesthesia Preprocedure Evaluation (Signed)
Anesthesia Evaluation  Patient identified by MRN, date of birth, ID band Patient awake    Reviewed: Allergy & Precautions, H&P , NPO status , Patient's Chart, lab work & pertinent test results  Airway Mallampati: III  TM Distance: <3 FB Neck ROM: Full    Dental no notable dental hx.    Pulmonary neg pulmonary ROS   Pulmonary exam normal breath sounds clear to auscultation       Cardiovascular hypertension, Normal cardiovascular exam Rhythm:Regular Rate:Normal     Neuro/Psych negative neurological ROS  negative psych ROS   GI/Hepatic negative GI ROS, Neg liver ROS,,,  Endo/Other  diabetes, Insulin Dependent  Morbid obesity  Renal/GU ESRFRenal disease  negative genitourinary   Musculoskeletal negative musculoskeletal ROS (+)    Abdominal   Peds negative pediatric ROS (+)  Hematology negative hematology ROS (+)   Anesthesia Other Findings   Reproductive/Obstetrics negative OB ROS                             Anesthesia Physical Anesthesia Plan  ASA: 3  Anesthesia Plan: General   Post-op Pain Management: Minimal or no pain anticipated   Induction: Intravenous  PONV Risk Score and Plan: 3 and Ondansetron, Dexamethasone and Treatment may vary due to age or medical condition  Airway Management Planned: LMA  Additional Equipment:   Intra-op Plan:   Post-operative Plan: Extubation in OR  Informed Consent: I have reviewed the patients History and Physical, chart, labs and discussed the procedure including the risks, benefits and alternatives for the proposed anesthesia with the patient or authorized representative who has indicated his/her understanding and acceptance.     Dental advisory given  Plan Discussed with: CRNA and Surgeon  Anesthesia Plan Comments:        Anesthesia Quick Evaluation

## 2022-09-27 NOTE — Interval H&P Note (Signed)
History and Physical Interval Note:  09/27/2022 7:24 AM  Latasha [name]  has presented today for surgery, with the diagnosis of ESRD.  The various methods of treatment have been discussed with the patient and family. After consideration of risks, benefits and other options for treatment, the patient has consented to  Procedure(s): RIGHT UPPER EXTREMITY ARTERIOVENOUS (AV) FISTULA VERSUS ARTERIOVENOUS GRAFT CREATION (Right) as a surgical intervention.  The patient's history has been reviewed, patient examined, no change in status, stable for surgery.  I have reviewed the patient's chart and labs.  Questions were answered to the patient's satisfaction.     Servando Snare

## 2022-09-28 ENCOUNTER — Encounter (HOSPITAL_COMMUNITY): Payer: Self-pay | Admitting: Vascular Surgery

## 2022-10-01 DIAGNOSIS — Z992 Dependence on renal dialysis: Secondary | ICD-10-CM | POA: Diagnosis not present

## 2022-10-01 DIAGNOSIS — N186 End stage renal disease: Secondary | ICD-10-CM | POA: Diagnosis not present

## 2022-10-01 DIAGNOSIS — N2581 Secondary hyperparathyroidism of renal origin: Secondary | ICD-10-CM | POA: Diagnosis not present

## 2022-10-02 DIAGNOSIS — Z794 Long term (current) use of insulin: Secondary | ICD-10-CM | POA: Diagnosis not present

## 2022-10-02 DIAGNOSIS — Z992 Dependence on renal dialysis: Secondary | ICD-10-CM | POA: Diagnosis not present

## 2022-10-02 DIAGNOSIS — N186 End stage renal disease: Secondary | ICD-10-CM | POA: Diagnosis not present

## 2022-10-02 DIAGNOSIS — E1122 Type 2 diabetes mellitus with diabetic chronic kidney disease: Secondary | ICD-10-CM | POA: Diagnosis not present

## 2022-10-03 ENCOUNTER — Telehealth: Payer: Self-pay | Admitting: Physician Assistant

## 2022-10-03 DIAGNOSIS — N186 End stage renal disease: Secondary | ICD-10-CM | POA: Diagnosis not present

## 2022-10-03 DIAGNOSIS — Z992 Dependence on renal dialysis: Secondary | ICD-10-CM | POA: Diagnosis not present

## 2022-10-03 DIAGNOSIS — N2581 Secondary hyperparathyroidism of renal origin: Secondary | ICD-10-CM | POA: Diagnosis not present

## 2022-10-03 NOTE — Telephone Encounter (Signed)
-----   Message from Dagoberto Ligas, PA-C sent at 09/27/2022  9:50 AM EST -----  5-6 weeks with fistula duplex with PA.  PO R brachiocephalic Thanks

## 2022-10-05 DIAGNOSIS — N186 End stage renal disease: Secondary | ICD-10-CM | POA: Diagnosis not present

## 2022-10-05 DIAGNOSIS — N2581 Secondary hyperparathyroidism of renal origin: Secondary | ICD-10-CM | POA: Diagnosis not present

## 2022-10-05 DIAGNOSIS — Z992 Dependence on renal dialysis: Secondary | ICD-10-CM | POA: Diagnosis not present

## 2022-10-06 DIAGNOSIS — I129 Hypertensive chronic kidney disease with stage 1 through stage 4 chronic kidney disease, or unspecified chronic kidney disease: Secondary | ICD-10-CM | POA: Diagnosis not present

## 2022-10-06 DIAGNOSIS — Z992 Dependence on renal dialysis: Secondary | ICD-10-CM | POA: Diagnosis not present

## 2022-10-06 DIAGNOSIS — N186 End stage renal disease: Secondary | ICD-10-CM | POA: Diagnosis not present

## 2022-10-10 DIAGNOSIS — N186 End stage renal disease: Secondary | ICD-10-CM | POA: Diagnosis not present

## 2022-10-10 DIAGNOSIS — Z992 Dependence on renal dialysis: Secondary | ICD-10-CM | POA: Diagnosis not present

## 2022-10-10 DIAGNOSIS — N2581 Secondary hyperparathyroidism of renal origin: Secondary | ICD-10-CM | POA: Diagnosis not present

## 2022-10-12 DIAGNOSIS — N2581 Secondary hyperparathyroidism of renal origin: Secondary | ICD-10-CM | POA: Diagnosis not present

## 2022-10-12 DIAGNOSIS — N186 End stage renal disease: Secondary | ICD-10-CM | POA: Diagnosis not present

## 2022-10-12 DIAGNOSIS — Z992 Dependence on renal dialysis: Secondary | ICD-10-CM | POA: Diagnosis not present

## 2022-10-17 DIAGNOSIS — Z992 Dependence on renal dialysis: Secondary | ICD-10-CM | POA: Diagnosis not present

## 2022-10-17 DIAGNOSIS — N186 End stage renal disease: Secondary | ICD-10-CM | POA: Diagnosis not present

## 2022-10-17 DIAGNOSIS — N2581 Secondary hyperparathyroidism of renal origin: Secondary | ICD-10-CM | POA: Diagnosis not present

## 2022-10-18 ENCOUNTER — Ambulatory Visit: Payer: Self-pay

## 2022-10-18 NOTE — Patient Instructions (Signed)
Visit Information  Thank you for taking time to visit with me today. Please don't hesitate to contact me if I can be of assistance to you.   Following are the goals we discussed today:   Goals Addressed             This Visit's Progress    Maintain Hemodialysis       Care Coordination Interventions: Ongoing Hemodialysis Evaluation of current treatment plan related to chronic kidney disease self management and patient's adherence to plan as established by provider      Reviewed prescribed diet Renal Discussed plans with patient for ongoing care management follow up and provided patient with direct contact information for care management team    Discussed the impact of chronic kidney disease on daily life and mental health and acknowledged and normalized feelings of disempowerment, fear, and frustration     Patient adjusting to hemodialysis [address].  Recent dialysis shunt but not using. Patient concerned about function. Patient to call vascular surgeon for follow up.  Discussed diabetes management Recently started on Mounjaro to follow up with endocrinologist about refills.  A1c 8.0 per patient.         Our next appointment is by telephone on 11/15/22 at 1100  Please call the care guide team at [phone] if you need to cancel or reschedule your appointment.   If you are experiencing a Mental Health or [employer] or need someone to talk to, please call the Suicide and Crisis Lifeline: 988   Patient verbalizes understanding of instructions and care plan provided today and agrees to view in [place]. Active MyChart status and patient understanding of how to access instructions and care plan via MyChart confirmed with patient.     Telephone follow up appointment with care management team member scheduled for: No further follow up required: February  Cameran Ahmed J Shamicka Inga, RN, MSN [facility] Care Management Care Management Coordinator Direct Line  [phone]

## 2022-10-18 NOTE — Patient Outreach (Signed)
  Care Coordination   Follow Up Visit Note   10/18/2022 Name: KATELYN BROADNAX MRN: [id number] DOB: [birth year]  [name] is a 67 y.o. year old female who sees Shirline Frees, MD for primary care. I spoke with  [name] by phone today.  What matters to the patients health and wellness today?  New dialysis shut function    Goals Addressed             This Visit's Progress    Maintain Hemodialysis       Care Coordination Interventions: Ongoing Hemodialysis Evaluation of current treatment plan related to chronic kidney disease self management and patient's adherence to plan as established by provider      Reviewed prescribed diet Renal Discussed plans with patient for ongoing care management follow up and provided patient with direct contact information for care management team    Discussed the impact of chronic kidney disease on daily life and mental health and acknowledged and normalized feelings of disempowerment, fear, and frustration     Patient adjusting to hemodialysis [address].  Recent dialysis shunt but not using. Patient concerned about function. Patient to call vascular surgeon for follow up.  Discussed diabetes management Recently started on Mounjaro to follow up with endocrinologist about refills.  A1c 8.0 per patient.         SDOH assessments and interventions completed:  Yes     Care Coordination Interventions:  Yes, provided   Follow up plan: Follow up call scheduled for February    Encounter Outcome:  Pt. Visit Completed   Jone Baseman, RN, MSN [facility] Care Management Coordinator Direct Line [phone]

## 2022-10-18 NOTE — Telephone Encounter (Signed)
Appointment has been scheduled.

## 2022-10-19 DIAGNOSIS — Z992 Dependence on renal dialysis: Secondary | ICD-10-CM | POA: Diagnosis not present

## 2022-10-19 DIAGNOSIS — N186 End stage renal disease: Secondary | ICD-10-CM | POA: Diagnosis not present

## 2022-10-19 DIAGNOSIS — N2581 Secondary hyperparathyroidism of renal origin: Secondary | ICD-10-CM | POA: Diagnosis not present

## 2022-10-22 DIAGNOSIS — Z992 Dependence on renal dialysis: Secondary | ICD-10-CM | POA: Diagnosis not present

## 2022-10-22 DIAGNOSIS — N2581 Secondary hyperparathyroidism of renal origin: Secondary | ICD-10-CM | POA: Diagnosis not present

## 2022-10-22 DIAGNOSIS — N186 End stage renal disease: Secondary | ICD-10-CM | POA: Diagnosis not present

## 2022-10-23 ENCOUNTER — Other Ambulatory Visit: Payer: Self-pay | Admitting: Physician Assistant

## 2022-10-24 DIAGNOSIS — N2581 Secondary hyperparathyroidism of renal origin: Secondary | ICD-10-CM | POA: Diagnosis not present

## 2022-10-24 DIAGNOSIS — N186 End stage renal disease: Secondary | ICD-10-CM | POA: Diagnosis not present

## 2022-10-24 DIAGNOSIS — Z992 Dependence on renal dialysis: Secondary | ICD-10-CM | POA: Diagnosis not present

## 2022-10-31 ENCOUNTER — Other Ambulatory Visit: Payer: Self-pay | Admitting: *Deleted

## 2022-10-31 DIAGNOSIS — N186 End stage renal disease: Secondary | ICD-10-CM

## 2022-11-02 DIAGNOSIS — N2581 Secondary hyperparathyroidism of renal origin: Secondary | ICD-10-CM | POA: Diagnosis not present

## 2022-11-02 DIAGNOSIS — N186 End stage renal disease: Secondary | ICD-10-CM | POA: Diagnosis not present

## 2022-11-02 DIAGNOSIS — Z992 Dependence on renal dialysis: Secondary | ICD-10-CM | POA: Diagnosis not present

## 2022-11-05 DIAGNOSIS — Z992 Dependence on renal dialysis: Secondary | ICD-10-CM | POA: Diagnosis not present

## 2022-11-05 DIAGNOSIS — N2581 Secondary hyperparathyroidism of renal origin: Secondary | ICD-10-CM | POA: Diagnosis not present

## 2022-11-05 DIAGNOSIS — N186 End stage renal disease: Secondary | ICD-10-CM | POA: Diagnosis not present

## 2022-11-06 DIAGNOSIS — Z992 Dependence on renal dialysis: Secondary | ICD-10-CM | POA: Diagnosis not present

## 2022-11-06 DIAGNOSIS — N186 End stage renal disease: Secondary | ICD-10-CM | POA: Diagnosis not present

## 2022-11-06 DIAGNOSIS — I129 Hypertensive chronic kidney disease with stage 1 through stage 4 chronic kidney disease, or unspecified chronic kidney disease: Secondary | ICD-10-CM | POA: Diagnosis not present

## 2022-11-07 DIAGNOSIS — N2581 Secondary hyperparathyroidism of renal origin: Secondary | ICD-10-CM | POA: Diagnosis not present

## 2022-11-07 DIAGNOSIS — Z992 Dependence on renal dialysis: Secondary | ICD-10-CM | POA: Diagnosis not present

## 2022-11-07 DIAGNOSIS — N186 End stage renal disease: Secondary | ICD-10-CM | POA: Diagnosis not present

## 2022-11-09 DIAGNOSIS — N186 End stage renal disease: Secondary | ICD-10-CM | POA: Diagnosis not present

## 2022-11-09 DIAGNOSIS — N2581 Secondary hyperparathyroidism of renal origin: Secondary | ICD-10-CM | POA: Diagnosis not present

## 2022-11-09 DIAGNOSIS — Z992 Dependence on renal dialysis: Secondary | ICD-10-CM | POA: Diagnosis not present

## 2022-11-12 DIAGNOSIS — N186 End stage renal disease: Secondary | ICD-10-CM | POA: Diagnosis not present

## 2022-11-12 DIAGNOSIS — Z992 Dependence on renal dialysis: Secondary | ICD-10-CM | POA: Diagnosis not present

## 2022-11-12 DIAGNOSIS — N2581 Secondary hyperparathyroidism of renal origin: Secondary | ICD-10-CM | POA: Diagnosis not present

## 2022-11-13 ENCOUNTER — Ambulatory Visit (HOSPITAL_COMMUNITY)
Admission: RE | Admit: 2022-11-13 | Discharge: 2022-11-13 | Disposition: A | Discharge status: Home / Self Care | Payer: Medicare HMO | Source: Ambulatory Visit | Attending: Vascular Surgery | Admitting: Vascular Surgery

## 2022-11-13 ENCOUNTER — Ambulatory Visit (INDEPENDENT_AMBULATORY_CARE_PROVIDER_SITE_OTHER): Payer: Medicare HMO | Admitting: Physician Assistant

## 2022-11-13 VITALS — BP 134/69 | HR 64 | Temp 98.0°F | Wt 264.0 lb

## 2022-11-13 DIAGNOSIS — N186 End stage renal disease: Secondary | ICD-10-CM | POA: Insufficient documentation

## 2022-11-13 NOTE — Progress Notes (Signed)
Office Note     CC:  follow up Requesting Provider:  Shirline Frees, MD  HPI: Latasha [name] is a 67 y.o. ([birth year]) female who presents status post revision right brachiocephalic fistula by Dr. Donzetta Matters on 09/27/2022.  Incisions are completely healed.  She does have some occasional cold feeling in her hand however she denies any lack of motor or sensation in the hand.  She is dialyzing via right IJ [facility] on a Tuesday Thursday Saturday schedule.  She is accompanied today by her son.   Past Medical History:  Diagnosis Date   Anemia    BPPV (benign paroxysmal positional vertigo)    Chronic congestive heart failure with left ventricular diastolic dysfunction (HCC)    CKD (chronic kidney disease), stage IV ([facility]) 05/15/2017   Diabetes mellitus    Dyspnea    History of blood transfusion    Hx of transfusion of packed red blood cells    Hyperlipidemia    Hypertension    Left atrial enlargement    Morbid obesity with BMI of 40.0-44.9, adult ([place])    Obstructive sleep apnea 01/22/2022   Pt denies having this diagnosis, as she did not have a proper study   Palpitations    Pneumonia     Past Surgical History:  Procedure Laterality Date   ABDOMINAL HYSTERECTOMY     AV FISTULA PLACEMENT Right 07/12/2022   Procedure: RIGHT ARM ARTERIOVENOUS (AV) FISTULA CREATION;  Surgeon: Waynetta Sandy, MD;  Location: [place];  Service: Vascular;  Laterality: Right;   AV FISTULA PLACEMENT Right 09/27/2022   Procedure: RIGHT UPPER EXTREMITY ARTERIOVENOUS (AV) FISTULA;  Surgeon: Waynetta Sandy, MD;  Location: [place];  Service: Vascular;  Laterality: Right;   BIOPSY  11/09/2020   Procedure: BIOPSY;  Surgeon: Wilford Corner, MD;  Location: [facility];  Service: Endoscopy;;   CAPD INSERTION N/A 05/17/2022   Procedure: LAPAROSCOPIC INSERTION CONTINUOUS AMBULATORY PERITONEAL DIALYSIS CATHETER;  Surgeon: Waynetta Sandy, MD;  Location: [place];  Service: Vascular;  Laterality: N/A;    CAPD REMOVAL Right 07/12/2022   Procedure: REMOVAL OF CONTINUOUS AMBULATORY PERITONEAL DIALYSIS  (CAPD) CATHETER;  Surgeon: Waynetta Sandy, MD;  Location: [place];  Service: Vascular;  Laterality: Right;   DILATION AND CURETTAGE OF UTERUS     ESOPHAGOGASTRODUODENOSCOPY N/A 11/09/2020   Procedure: ESOPHAGOGASTRODUODENOSCOPY (EGD);  Surgeon: Wilford Corner, MD;  Location: [facility];  Service: Endoscopy;  Laterality: N/A;   EYE SURGERY     cataract/left eye, right eye had bleeding in back   IR FLUORO GUIDE CV LINE RIGHT  10/26/2020   IR US GUIDE VASC ACCESS RIGHT  10/26/2020   KNEE SURGERY     LEFT HEART CATH AND CORONARY ANGIOGRAPHY N/A 03/09/2018   Procedure: LEFT HEART CATH AND CORONARY ANGIOGRAPHY;  Surgeon: Lorretta Harp, MD;  Location: [place] CV [facility];  Service: Cardiovascular;  Laterality: N/A;   THROMBECTOMY W/ EMBOLECTOMY Right 09/27/2022   Procedure: THROMBECTOMY ARTERIOVENOUS FISTULA;  Surgeon: Waynetta Sandy, MD;  Location: [facility] OR;  Service: Vascular;  Laterality: Right;   TRIGGER FINGER RELEASE     TUBAL LIGATION      Social History   Socioeconomic History   Marital status: Married    Spouse name: Not on file   Number of children: Not on file   Years of education: Not on file   Highest education level: Not on file  Occupational History   Occupation: retired  Tobacco Use   Smoking status: Never  Passive exposure: Never   Smokeless tobacco: Never  Vaping Use   Vaping Use: Never used  Substance and Sexual Activity   Alcohol use: Yes    Comment:  rare   Drug use: No   Sexual activity: Not on file  Other Topics Concern   Not on file  Social History Narrative   Not on file   Social Determinants of Health   Financial Resource Strain: Not on file  Food Insecurity: No Food Insecurity (06/12/2022)   Hunger Vital Sign    Worried About Running Out of Food in the Last Year: Never true    Ran Out of Food in the Last Year: Never true   Transportation Needs: No Transportation Needs (12/19/2021)   PRAPARE - Hydrologist (Medical): No    Lack of Transportation (Non-Medical): No  Physical Activity: Not on file  Stress: Not on file  Social Connections: Unknown (06/12/2022)   Social Connection and Isolation Panel [NHANES]    Frequency of Communication with Friends and Family: More than three times a week    Frequency of Social Gatherings with Friends and Family: More than three times a week    Attends Religious Services: Not on Advertising copywriter or Organizations: No    Attends Archivist Meetings: Never    Marital Status: Married  Human resources officer Violence: Not on file    Family History  Problem Relation Age of Onset   Heart disease Father    Hypertension Father    Alzheimer's disease Mother    Diabetes Mother    Diabetes Sister    Dementia Other     Current Outpatient Medications  Medication Sig Dispense Refill   acetaminophen (TYLENOL) 650 MG CR tablet Take 1,300 mg by mouth every 8 (eight) hours as needed for pain.     amLODipine (NORVASC) 10 MG tablet Take 1 tablet (10 mg total) by mouth daily. (Patient taking differently: Take 5-10 mg by mouth See admin instructions. Take 1 tablet (10 mg) by mouth in the morning on Tuesdays, Thursdays & Saturdays. Take 0.5 tablet (5 mg) by mouth on Sundays, Mondays, Wednesdays & Fridays in the morning.) 30 tablet 0   atorvastatin (LIPITOR) 40 MG tablet Take 40 mg by mouth in the morning.     B Complex-C-Zn-Folic Acid (DIALYVITE XX123456 PO) Take 1 tablet by mouth daily.     carvedilol (COREG) 25 MG tablet Take 1 tablet (25 mg total) by mouth 2 (two) times daily with a meal. 60 tablet 0   Doxercalciferol (HECTOROL IV) Doxercalciferol (Hectorol)     gabapentin (NEURONTIN) 100 MG capsule Take 300 mg by mouth in the morning, at noon, and at bedtime.     HEPARIN SODIUM, PORCINE, IV Heparin Sodium (Porcine) 1,000 Units/mL Systemic      lactulose (CHRONULAC) 10 GM/15ML solution Take 10 g by mouth 2 (two) times daily as needed for moderate constipation.     NOVOLOG MIX 70/30 FLEXPEN (70-30) 100 UNIT/ML FlexPen Inject 50-60 Units into the skin See admin instructions. Inject 60 units into the skin before breakfast and 50 unit before dinner     pantoprazole (PROTONIX) 40 MG tablet Take 40 mg by mouth in the morning before breakfast (Patient taking differently: Take 40 mg by mouth daily.)     tiZANidine (ZANAFLEX) 2 MG tablet Take 2 mg by mouth 3 (three) times daily as needed for muscle spasms.     traMADol (ULTRAM) 50 MG  tablet TAKE 1 TABLET BY MOUTH EVERY 6 HOURS AS NEEDED 20 tablet 0   No current facility-administered medications for this visit.    Allergies  Allergen Reactions   Hydrocodone-Acetaminophen Other (See Comments)    Unknown reaction   Oxycodone Hcl Other (See Comments)    Unknown reaction   Ozempic (0.25 Or 0.5 Mg-Dose) [Semaglutide(0.25 Or 0.32m-Dos)] Nausea And Vomiting   Amitriptyline Nausea Only   Celexa [Citalopram] Other (See Comments)    Nightmares      REVIEW OF SYSTEMS:   [X]$  denotes positive finding, [ ]$  denotes negative finding Cardiac  Comments:  Chest pain or chest pressure:    Shortness of breath upon exertion:    Short of breath when lying flat:    Irregular heart rhythm:        Vascular    Pain in calf, thigh, or hip brought on by ambulation:    Pain in feet at night that wakes you up from your sleep:     Blood clot in your veins:    Leg swelling:         Pulmonary    Oxygen at home:    Productive cough:     Wheezing:         Neurologic    Sudden weakness in arms or legs:     Sudden numbness in arms or legs:     Sudden onset of difficulty speaking or slurred speech:    Temporary loss of vision in one eye:     Problems with dizziness:         Gastrointestinal    Blood in stool:     Vomited blood:         Genitourinary    Burning when urinating:     Blood in urine:         Psychiatric    Major depression:         Hematologic    Bleeding problems:    Problems with blood clotting too easily:        Skin    Rashes or ulcers:        Constitutional    Fever or chills:      PHYSICAL EXAMINATION:  Vitals:   11/13/22 1428  BP: 134/69  Pulse: 64  Temp: 98 F (36.7 C)  TempSrc: Temporal  SpO2: 96%  Weight: 264 lb (119.7 kg)    General:  WDWN in NAD; vital signs documented above Gait: Not observed HENT: WNL, normocephalic Pulmonary: normal non-labored breathing , without Rales, rhonchi,  wheezing Cardiac: regular HR Abdomen: soft, NT, no masses Skin: without rashes Vascular Exam/Pulses: Easily palpable thrill at the anastomosis; 1+ right radial pulse Extremities: without ischemic changes, without Gangrene , without cellulitis; without open wounds;  Musculoskeletal: no muscle wasting or atrophy  Neurologic: A&O X 3;  No focal weakness or paresthesias are detected Psychiatric:  The pt has Normal affect.   Non-Invasive Vascular Imaging:   Patent right brachiocephalic fistula with greater than 6 mm diameter and greater than 6 mm depth with numerous large sidebranches    ASSESSMENT/PLAN:: 67y.o. female status post right brachiocephalic fistula creation  -Patent right brachiocephalic fistula with only mild symptoms of steal.  Duplex demonstrates a mature fistula however it is too deep from the distal to upper arm.  She will require superficialization of the fistula prior to cannulating for dialysis.  [facility]  She requests superficialization surgery on a Friday  with Dr. Donzetta Matters in the near future.  Case was discussed with the patient and her son including risks such as steal syndrome, bleeding, thrombosis of fistula, and need for future surgery.  She is agreeable to proceed.   Dagoberto Ligas, PA-C [facility] [phone]  Clinic MD:   Donzetta Matters

## 2022-11-13 NOTE — H&P (View-Only) (Signed)
Office Note     CC:  follow up Requesting Provider:  Shirline Frees, MD  HPI: Latasha [name] is a 67 y.o. ([birth year]) female who presents status post revision right brachiocephalic fistula by Dr. Donzetta Matters on 09/27/2022.  Incisions are completely healed.  She does have some occasional cold feeling in her hand however she denies any lack of motor or sensation in the hand.  She is dialyzing via right IJ [facility] on a Tuesday Thursday Saturday schedule.  She is accompanied today by her son.   Past Medical History:  Diagnosis Date   Anemia    BPPV (benign paroxysmal positional vertigo)    Chronic congestive heart failure with left ventricular diastolic dysfunction (HCC)    CKD (chronic kidney disease), stage IV ([facility]) 05/15/2017   Diabetes mellitus    Dyspnea    History of blood transfusion    Hx of transfusion of packed red blood cells    Hyperlipidemia    Hypertension    Left atrial enlargement    Morbid obesity with BMI of 40.0-44.9, adult ([place])    Obstructive sleep apnea 01/22/2022   Pt denies having this diagnosis, as she did not have a proper study   Palpitations    Pneumonia     Past Surgical History:  Procedure Laterality Date   ABDOMINAL HYSTERECTOMY     AV FISTULA PLACEMENT Right 07/12/2022   Procedure: RIGHT ARM ARTERIOVENOUS (AV) FISTULA CREATION;  Surgeon: Waynetta Sandy, MD;  Location: [place];  Service: Vascular;  Laterality: Right;   AV FISTULA PLACEMENT Right 09/27/2022   Procedure: RIGHT UPPER EXTREMITY ARTERIOVENOUS (AV) FISTULA;  Surgeon: Waynetta Sandy, MD;  Location: [place];  Service: Vascular;  Laterality: Right;   BIOPSY  11/09/2020   Procedure: BIOPSY;  Surgeon: Wilford Corner, MD;  Location: [facility];  Service: Endoscopy;;   CAPD INSERTION N/A 05/17/2022   Procedure: LAPAROSCOPIC INSERTION CONTINUOUS AMBULATORY PERITONEAL DIALYSIS CATHETER;  Surgeon: Waynetta Sandy, MD;  Location: [place];  Service: Vascular;  Laterality: N/A;    CAPD REMOVAL Right 07/12/2022   Procedure: REMOVAL OF CONTINUOUS AMBULATORY PERITONEAL DIALYSIS  (CAPD) CATHETER;  Surgeon: Waynetta Sandy, MD;  Location: [place];  Service: Vascular;  Laterality: Right;   DILATION AND CURETTAGE OF UTERUS     ESOPHAGOGASTRODUODENOSCOPY N/A 11/09/2020   Procedure: ESOPHAGOGASTRODUODENOSCOPY (EGD);  Surgeon: Wilford Corner, MD;  Location: [facility];  Service: Endoscopy;  Laterality: N/A;   EYE SURGERY     cataract/left eye, right eye had bleeding in back   IR FLUORO GUIDE CV LINE RIGHT  10/26/2020   IR US GUIDE VASC ACCESS RIGHT  10/26/2020   KNEE SURGERY     LEFT HEART CATH AND CORONARY ANGIOGRAPHY N/A 03/09/2018   Procedure: LEFT HEART CATH AND CORONARY ANGIOGRAPHY;  Surgeon: Lorretta Harp, MD;  Location: [place] [facility];  Service: Cardiovascular;  Laterality: N/A;   THROMBECTOMY W/ EMBOLECTOMY Right 09/27/2022   Procedure: THROMBECTOMY ARTERIOVENOUS FISTULA;  Surgeon: Waynetta Sandy, MD;  Location: [facility] OR;  Service: Vascular;  Laterality: Right;   TRIGGER FINGER RELEASE     TUBAL LIGATION      Social History   Socioeconomic History   Marital status: Married    Spouse name: Not on file   Number of children: Not on file   Years of education: Not on file   Highest education level: Not on file  Occupational History   Occupation: retired  Tobacco Use   Smoking status: Never  Passive exposure: Never   Smokeless tobacco: Never  Vaping Use   Vaping Use: Never used  Substance and Sexual Activity   Alcohol use: Yes    Comment:  rare   Drug use: No   Sexual activity: Not on file  Other Topics Concern   Not on file  Social History Narrative   Not on file   Social Determinants of Health   Financial Resource Strain: Not on file  Food Insecurity: No Food Insecurity (06/12/2022)   Hunger Vital Sign    Worried About Running Out of Food in the Last Year: Never true    Ran Out of Food in the Last Year: Never true   Transportation Needs: No Transportation Needs (12/19/2021)   PRAPARE - Hydrologist (Medical): No    Lack of Transportation (Non-Medical): No  Physical Activity: Not on file  Stress: Not on file  Social Connections: Unknown (06/12/2022)   Social Connection and Isolation Panel [NHANES]    Frequency of Communication with Friends and Family: More than three times a week    Frequency of Social Gatherings with Friends and Family: More than three times a week    Attends Religious Services: Not on Advertising copywriter or Organizations: No    Attends Archivist Meetings: Never    Marital Status: Married  Human resources officer Violence: Not on file    Family History  Problem Relation Age of Onset   Heart disease Father    Hypertension Father    Alzheimer's disease Mother    Diabetes Mother    Diabetes Sister    Dementia Other     Current Outpatient Medications  Medication Sig Dispense Refill   acetaminophen (TYLENOL) 650 MG CR tablet Take 1,300 mg by mouth every 8 (eight) hours as needed for pain.     amLODipine (NORVASC) 10 MG tablet Take 1 tablet (10 mg total) by mouth daily. (Patient taking differently: Take 5-10 mg by mouth See admin instructions. Take 1 tablet (10 mg) by mouth in the morning on Tuesdays, Thursdays & Saturdays. Take 0.5 tablet (5 mg) by mouth on Sundays, Mondays, Wednesdays & Fridays in the morning.) 30 tablet 0   atorvastatin (LIPITOR) 40 MG tablet Take 40 mg by mouth in the morning.     B Complex-C-Zn-Folic Acid (DIALYVITE XX123456 PO) Take 1 tablet by mouth daily.     carvedilol (COREG) 25 MG tablet Take 1 tablet (25 mg total) by mouth 2 (two) times daily with a meal. 60 tablet 0   Doxercalciferol (HECTOROL IV) Doxercalciferol (Hectorol)     gabapentin (NEURONTIN) 100 MG capsule Take 300 mg by mouth in the morning, at noon, and at bedtime.     HEPARIN SODIUM, PORCINE, IV Heparin Sodium (Porcine) 1,000 Units/mL Systemic      lactulose (CHRONULAC) 10 GM/15ML solution Take 10 g by mouth 2 (two) times daily as needed for moderate constipation.     NOVOLOG MIX 70/30 FLEXPEN (70-30) 100 UNIT/ML FlexPen Inject 50-60 Units into the skin See admin instructions. Inject 60 units into the skin before breakfast and 50 unit before dinner     pantoprazole (PROTONIX) 40 MG tablet Take 40 mg by mouth in the morning before breakfast (Patient taking differently: Take 40 mg by mouth daily.)     tiZANidine (ZANAFLEX) 2 MG tablet Take 2 mg by mouth 3 (three) times daily as needed for muscle spasms.     traMADol (ULTRAM) 50 MG  tablet TAKE 1 TABLET BY MOUTH EVERY 6 HOURS AS NEEDED 20 tablet 0   No current facility-administered medications for this visit.    Allergies  Allergen Reactions   Hydrocodone-Acetaminophen Other (See Comments)    Unknown reaction   Oxycodone Hcl Other (See Comments)    Unknown reaction   Ozempic (0.25 Or 0.5 Mg-Dose) [Semaglutide(0.25 Or 0.32m-Dos)] Nausea And Vomiting   Amitriptyline Nausea Only   Celexa [Citalopram] Other (See Comments)    Nightmares      REVIEW OF SYSTEMS:   [X]$  denotes positive finding, [ ]$  denotes negative finding Cardiac  Comments:  Chest pain or chest pressure:    Shortness of breath upon exertion:    Short of breath when lying flat:    Irregular heart rhythm:        Vascular    Pain in calf, thigh, or hip brought on by ambulation:    Pain in feet at night that wakes you up from your sleep:     Blood clot in your veins:    Leg swelling:         Pulmonary    Oxygen at home:    Productive cough:     Wheezing:         Neurologic    Sudden weakness in arms or legs:     Sudden numbness in arms or legs:     Sudden onset of difficulty speaking or slurred speech:    Temporary loss of vision in one eye:     Problems with dizziness:         Gastrointestinal    Blood in stool:     Vomited blood:         Genitourinary    Burning when urinating:     Blood in urine:         Psychiatric    Major depression:         Hematologic    Bleeding problems:    Problems with blood clotting too easily:        Skin    Rashes or ulcers:        Constitutional    Fever or chills:      PHYSICAL EXAMINATION:  Vitals:   11/13/22 1428  BP: 134/69  Pulse: 64  Temp: 98 F (36.7 C)  TempSrc: Temporal  SpO2: 96%  Weight: 264 lb (119.7 kg)    General:  WDWN in NAD; vital signs documented above Gait: Not observed HENT: WNL, normocephalic Pulmonary: normal non-labored breathing , without Rales, rhonchi,  wheezing Cardiac: regular HR Abdomen: soft, NT, no masses Skin: without rashes Vascular Exam/Pulses: Easily palpable thrill at the anastomosis; 1+ right radial pulse Extremities: without ischemic changes, without Gangrene , without cellulitis; without open wounds;  Musculoskeletal: no muscle wasting or atrophy  Neurologic: A&O X 3;  No focal weakness or paresthesias are detected Psychiatric:  The pt has Normal affect.   Non-Invasive Vascular Imaging:   Patent right brachiocephalic fistula with greater than 6 mm diameter and greater than 6 mm depth with numerous large sidebranches    ASSESSMENT/PLAN:: 67y.o. female status post right brachiocephalic fistula creation  -Patent right brachiocephalic fistula with only mild symptoms of steal.  Duplex demonstrates a mature fistula however it is too deep from the distal to upper arm.  She will require superficialization of the fistula prior to cannulating for dialysis.  [facility]  She requests superficialization surgery on a Friday  with Dr. Donzetta Matters in the near future.  Case was discussed with the patient and her son including risks such as steal syndrome, bleeding, thrombosis of fistula, and need for future surgery.  She is agreeable to proceed.   Dagoberto Ligas, PA-C [facility] [phone]  Clinic MD:   Donzetta Matters

## 2022-11-14 ENCOUNTER — Other Ambulatory Visit: Payer: Self-pay

## 2022-11-14 DIAGNOSIS — N186 End stage renal disease: Secondary | ICD-10-CM

## 2022-11-15 ENCOUNTER — Ambulatory Visit: Payer: Self-pay

## 2022-11-15 NOTE — Patient Instructions (Signed)
Visit Information  Thank you for taking time to visit with me today. Please don't hesitate to contact me if I can be of assistance to you.   Following are the goals we discussed today:   Goals Addressed             This Visit's Progress    Maintain Hemodialysis       Care Coordination Interventions: Ongoing Hemodialysis Evaluation of current treatment plan related to chronic kidney disease self management and patient's adherence to plan as established by provider      Reviewed prescribed diet Renal Discussed plans with patient for ongoing care management follow up and provided patient with direct contact information for care management team    Discussed the impact of chronic kidney disease on daily life and mental health and acknowledged and normalized feelings of disempowerment, fear, and frustration     Patient adjusting to hemodialysis [address].  Recent dialysis shunt but not using. There is some concern about  management of shunt.  Patient will have surgery to shunt next week. Discussed diabetes management   A1c 8.0 per patient.         Our next appointment is by telephone on 11/29/22 at 1115 am  Please call the care guide team at [phone] if you need to cancel or reschedule your appointment.   If you are experiencing a Mental Health or [place] or need someone to talk to, please call the Suicide and Crisis Lifeline: 988   Patient verbalizes understanding of instructions and care plan provided today and agrees to view in [place]. Active MyChart status and patient understanding of how to access instructions and care plan via MyChart confirmed with patient.     The patient has been provided with contact information for the care management team and has been advised to call with any health related questions or concerns.   Jone Baseman, RN, MSN [place] Management Care Management Coordinator Direct Line [phone]

## 2022-11-15 NOTE — Patient Outreach (Signed)
  Care Coordination   Follow Up Visit Note   11/15/2022 Name: Latasha [name] MRN: [id number] DOB: 26-Sep-1956  Latasha [name] is a 67 y.o. year old female who sees Shirline Frees, MD for primary care. I spoke with  Latasha [name] by phone today.  What matters to the patients health and wellness today?  Needs dialysis shunt revision.             Goals Addressed             This Visit's Progress    Maintain Hemodialysis       Care Coordination Interventions: Ongoing Hemodialysis Evaluation of current treatment plan related to chronic kidney disease self management and patient's adherence to plan as established by provider      Reviewed prescribed diet Renal Discussed plans with patient for ongoing care management follow up and provided patient with direct contact information for care management team    Discussed the impact of chronic kidney disease on daily life and mental health and acknowledged and normalized feelings of disempowerment, fear, and frustration     Patient adjusting to hemodialysis [address].  Recent dialysis shunt but not using. There is some concern about  management of shunt.  Patient will have surgery to shunt next week. Discussed diabetes management   A1c 8.0 per patient.         SDOH assessments and interventions completed:  Yes     Care Coordination Interventions:  Yes, provided   Follow up plan: Follow up call scheduled for 11/29/22    Encounter Outcome:  Pt. Visit Completed   Jone Baseman, RN, MSN [place] Management Care Management Coordinator Direct Line [phone]

## 2022-11-16 DIAGNOSIS — N2581 Secondary hyperparathyroidism of renal origin: Secondary | ICD-10-CM | POA: Diagnosis not present

## 2022-11-16 DIAGNOSIS — Z992 Dependence on renal dialysis: Secondary | ICD-10-CM | POA: Diagnosis not present

## 2022-11-16 DIAGNOSIS — N186 End stage renal disease: Secondary | ICD-10-CM | POA: Diagnosis not present

## 2022-11-19 DIAGNOSIS — Z992 Dependence on renal dialysis: Secondary | ICD-10-CM | POA: Diagnosis not present

## 2022-11-19 DIAGNOSIS — N186 End stage renal disease: Secondary | ICD-10-CM | POA: Diagnosis not present

## 2022-11-19 DIAGNOSIS — N2581 Secondary hyperparathyroidism of renal origin: Secondary | ICD-10-CM | POA: Diagnosis not present

## 2022-11-21 ENCOUNTER — Encounter (HOSPITAL_COMMUNITY): Payer: Self-pay | Admitting: Vascular Surgery

## 2022-11-21 DIAGNOSIS — Z992 Dependence on renal dialysis: Secondary | ICD-10-CM | POA: Diagnosis not present

## 2022-11-21 DIAGNOSIS — N2581 Secondary hyperparathyroidism of renal origin: Secondary | ICD-10-CM | POA: Diagnosis not present

## 2022-11-21 DIAGNOSIS — N186 End stage renal disease: Secondary | ICD-10-CM | POA: Diagnosis not present

## 2022-11-21 NOTE — Progress Notes (Signed)
Mrs Latasha [name] denies chest pain or shortness of breath. Patient denies having any s/s of Covid in her household, also denies any known exposure to Covid.   Mrs Latasha [name] PCP is Dr. Rogers Blocker, cardiologist is Dr. [name].  Mrs. Latasha [name] has type II diabetes she wears a [employer] continuous blood sugar monitor.  Mrs. Latasha [name] takes 70/30 Insulin I instructed patient if CBG is less than 70 to take 4 Glucose Tablets or 1 tube of Glucose Gel or 1/2 cup of a clear juice. Recheck CBG in 15 minutes if CBG is not over 70 call, pre- op desk at [phone] for further instructions.

## 2022-11-22 ENCOUNTER — Ambulatory Visit (HOSPITAL_COMMUNITY)
Admission: RE | Admit: 2022-11-22 | Discharge: 2022-11-22 | Disposition: A | Discharge status: Home / Self Care | Payer: Medicare HMO | Attending: Vascular Surgery | Admitting: Vascular Surgery

## 2022-11-22 ENCOUNTER — Encounter (HOSPITAL_COMMUNITY)
Admission: RE | Disposition: A | Discharge status: Home / Self Care | Payer: Self-pay | Source: Home / Self Care | Attending: Vascular Surgery

## 2022-11-22 ENCOUNTER — Encounter (HOSPITAL_COMMUNITY): Payer: Self-pay | Admitting: Vascular Surgery

## 2022-11-22 ENCOUNTER — Ambulatory Visit (HOSPITAL_BASED_OUTPATIENT_CLINIC_OR_DEPARTMENT_OTHER): Payer: Medicare HMO | Admitting: Anesthesiology

## 2022-11-22 ENCOUNTER — Ambulatory Visit (HOSPITAL_COMMUNITY): Payer: Medicare HMO | Admitting: Anesthesiology

## 2022-11-22 ENCOUNTER — Other Ambulatory Visit: Payer: Self-pay

## 2022-11-22 DIAGNOSIS — T82590A Other mechanical complication of surgically created arteriovenous fistula, initial encounter: Secondary | ICD-10-CM | POA: Insufficient documentation

## 2022-11-22 DIAGNOSIS — I130 Hypertensive heart and chronic kidney disease with heart failure and stage 1 through stage 4 chronic kidney disease, or unspecified chronic kidney disease: Secondary | ICD-10-CM | POA: Insufficient documentation

## 2022-11-22 DIAGNOSIS — E1122 Type 2 diabetes mellitus with diabetic chronic kidney disease: Secondary | ICD-10-CM | POA: Insufficient documentation

## 2022-11-22 DIAGNOSIS — N185 Chronic kidney disease, stage 5: Secondary | ICD-10-CM | POA: Diagnosis not present

## 2022-11-22 DIAGNOSIS — I5032 Chronic diastolic (congestive) heart failure: Secondary | ICD-10-CM | POA: Diagnosis not present

## 2022-11-22 DIAGNOSIS — Z8249 Family history of ischemic heart disease and other diseases of the circulatory system: Secondary | ICD-10-CM | POA: Diagnosis not present

## 2022-11-22 DIAGNOSIS — N186 End stage renal disease: Secondary | ICD-10-CM | POA: Diagnosis not present

## 2022-11-22 DIAGNOSIS — Z6841 Body Mass Index (BMI) 40.0 and over, adult: Secondary | ICD-10-CM | POA: Diagnosis not present

## 2022-11-22 DIAGNOSIS — Y832 Surgical operation with anastomosis, bypass or graft as the cause of abnormal reaction of the patient, or of later complication, without mention of misadventure at the time of the procedure: Secondary | ICD-10-CM | POA: Insufficient documentation

## 2022-11-22 DIAGNOSIS — I132 Hypertensive heart and chronic kidney disease with heart failure and with stage 5 chronic kidney disease, or end stage renal disease: Secondary | ICD-10-CM | POA: Diagnosis not present

## 2022-11-22 DIAGNOSIS — Z833 Family history of diabetes mellitus: Secondary | ICD-10-CM | POA: Diagnosis not present

## 2022-11-22 DIAGNOSIS — T82898A Other specified complication of vascular prosthetic devices, implants and grafts, initial encounter: Secondary | ICD-10-CM | POA: Diagnosis not present

## 2022-11-22 DIAGNOSIS — I509 Heart failure, unspecified: Secondary | ICD-10-CM | POA: Diagnosis not present

## 2022-11-22 DIAGNOSIS — Z992 Dependence on renal dialysis: Secondary | ICD-10-CM | POA: Diagnosis not present

## 2022-11-22 DIAGNOSIS — D631 Anemia in chronic kidney disease: Secondary | ICD-10-CM

## 2022-11-22 DIAGNOSIS — K219 Gastro-esophageal reflux disease without esophagitis: Secondary | ICD-10-CM | POA: Diagnosis not present

## 2022-11-22 DIAGNOSIS — Z794 Long term (current) use of insulin: Secondary | ICD-10-CM

## 2022-11-22 HISTORY — PX: FISTULA SUPERFICIALIZATION: SHX6341

## 2022-11-22 HISTORY — DX: End stage renal disease: N18.6

## 2022-11-22 HISTORY — DX: Unspecified osteoarthritis, unspecified site: M19.90

## 2022-11-22 LAB — POCT I-STAT, CHEM 8
BUN: 35 mg/dL — ABNORMAL HIGH (ref 8–23)
Calcium, Ion: 1.03 mmol/L — ABNORMAL LOW (ref 1.15–1.40)
Chloride: 98 mmol/L (ref 98–111)
Creatinine, Ser: 3.2 mg/dL — ABNORMAL HIGH (ref 0.44–1.00)
Glucose, Bld: 288 mg/dL — ABNORMAL HIGH (ref 70–99)
HCT: 35 % — ABNORMAL LOW (ref 36.0–46.0)
Hemoglobin: 11.9 g/dL — ABNORMAL LOW (ref 12.0–15.0)
Potassium: 4.2 mmol/L (ref 3.5–5.1)
Sodium: 138 mmol/L (ref 135–145)
TCO2: 29 mmol/L (ref 22–32)

## 2022-11-22 LAB — GLUCOSE, CAPILLARY
Glucose-Capillary: 275 mg/dL — ABNORMAL HIGH (ref 70–99)
Glucose-Capillary: 249 mg/dL — ABNORMAL HIGH (ref 70–99)

## 2022-11-22 SURGERY — FISTULA SUPERFICIALIZATION
Anesthesia: General | Laterality: Right

## 2022-11-22 MED ORDER — GLYCOPYRROLATE PF 0.2 MG/ML IJ SOSY
PREFILLED_SYRINGE | INTRAMUSCULAR | Status: AC
  Filled 2022-11-22: qty 2

## 2022-11-22 MED ORDER — ONDANSETRON HCL 4 MG/2ML IJ SOLN: 4.0000 mg | Freq: Once | INTRAMUSCULAR | Status: DC | PRN

## 2022-11-22 MED ORDER — HEPARIN 6000 UNIT IRRIGATION SOLUTION
Status: AC
  Filled 2022-11-22: qty 500

## 2022-11-22 MED ORDER — PROPOFOL 10 MG/ML IV BOLUS
INTRAVENOUS | Status: AC
  Filled 2022-11-22: qty 20

## 2022-11-22 MED ORDER — MIDAZOLAM HCL 5 MG/5ML IJ SOLN
INTRAMUSCULAR | Status: DC | PRN
  Administered 2022-11-22: 2 mg via INTRAVENOUS

## 2022-11-22 MED ORDER — ONDANSETRON HCL 4 MG/2ML IJ SOLN
INTRAMUSCULAR | Status: DC | PRN
  Administered 2022-11-22: 4 mg via INTRAVENOUS

## 2022-11-22 MED ORDER — PHENYLEPHRINE 80 MCG/ML (10ML) SYRINGE FOR IV PUSH (FOR BLOOD PRESSURE SUPPORT)
PREFILLED_SYRINGE | INTRAVENOUS | Status: DC | PRN
  Administered 2022-11-22: 80 ug via INTRAVENOUS
  Administered 2022-11-22 (×2): 160 ug via INTRAVENOUS
  Administered 2022-11-22: 80 ug via INTRAVENOUS

## 2022-11-22 MED ORDER — FENTANYL CITRATE (PF) 250 MCG/5ML IJ SOLN
INTRAMUSCULAR | Status: AC
  Filled 2022-11-22: qty 5

## 2022-11-22 MED ORDER — MIDAZOLAM HCL 2 MG/2ML IJ SOLN
INTRAMUSCULAR | Status: AC
  Filled 2022-11-22: qty 2

## 2022-11-22 MED ORDER — CEFAZOLIN SODIUM-DEXTROSE 2-4 GM/100ML-% IV SOLN
2.0000 g | INTRAVENOUS | Status: AC
  Administered 2022-11-22: 2 g via INTRAVENOUS
  Filled 2022-11-22: qty 100

## 2022-11-22 MED ORDER — HEPARIN 6000 UNIT IRRIGATION SOLUTION
Status: DC | PRN
  Administered 2022-11-22: 1

## 2022-11-22 MED ORDER — TRAMADOL HCL 50 MG PO TABS: 50.0000 mg | ORAL_TABLET | Freq: Four times a day (QID) | ORAL | 0 refills | Status: AC | PRN

## 2022-11-22 MED ORDER — PHENYLEPHRINE HCL (PRESSORS) 10 MG/ML IV SOLN
INTRAVENOUS | Status: AC
  Filled 2022-11-22: qty 1

## 2022-11-22 MED ORDER — FENTANYL CITRATE (PF) 100 MCG/2ML IJ SOLN
INTRAMUSCULAR | Status: DC | PRN
  Administered 2022-11-22 (×3): 50 ug via INTRAVENOUS

## 2022-11-22 MED ORDER — CHLORHEXIDINE GLUCONATE 0.12 % MT SOLN: 15.0000 mL | Freq: Once | OROMUCOSAL | Status: AC

## 2022-11-22 MED ORDER — SUCCINYLCHOLINE CHLORIDE 200 MG/10ML IV SOSY
PREFILLED_SYRINGE | INTRAVENOUS | Status: DC | PRN
  Administered 2022-11-22: 120 mg via INTRAVENOUS

## 2022-11-22 MED ORDER — PROPOFOL 10 MG/ML IV BOLUS
INTRAVENOUS | Status: DC | PRN
  Administered 2022-11-22: 100 mg via INTRAVENOUS
  Administered 2022-11-22 (×2): 50 mg via INTRAVENOUS

## 2022-11-22 MED ORDER — CHLORHEXIDINE GLUCONATE 4 % EX LIQD: 60.0000 mL | Freq: Once | CUTANEOUS | Status: DC

## 2022-11-22 MED ORDER — SODIUM CHLORIDE 0.9 % IV SOLN: INTRAVENOUS | Status: DC

## 2022-11-22 MED ORDER — INSULIN ASPART 100 UNIT/ML IJ SOLN
0.0000 [IU] | INTRAMUSCULAR | Status: DC | PRN
  Administered 2022-11-22: 4 [IU] via SUBCUTANEOUS
  Filled 2022-11-22: qty 1

## 2022-11-22 MED ORDER — CARVEDILOL 12.5 MG PO TABS
25.0000 mg | ORAL_TABLET | Freq: Once | ORAL | Status: AC
  Administered 2022-11-22: 25 mg via ORAL
  Filled 2022-11-22: qty 2

## 2022-11-22 MED ORDER — NEOSTIGMINE METHYLSULFATE 3 MG/3ML IV SOSY
PREFILLED_SYRINGE | INTRAVENOUS | Status: AC
  Filled 2022-11-22: qty 6

## 2022-11-22 MED ORDER — ACETAMINOPHEN 500 MG PO TABS
ORAL_TABLET | ORAL | Status: AC
  Administered 2022-11-22: 1000 mg via ORAL
  Filled 2022-11-22: qty 2

## 2022-11-22 MED ORDER — FENTANYL CITRATE (PF) 100 MCG/2ML IJ SOLN: 25.0000 ug | INTRAMUSCULAR | Status: DC | PRN

## 2022-11-22 MED ORDER — ORAL CARE MOUTH RINSE: 15.0000 mL | Freq: Once | OROMUCOSAL | Status: AC

## 2022-11-22 MED ORDER — EPHEDRINE SULFATE-NACL 50-0.9 MG/10ML-% IV SOSY
PREFILLED_SYRINGE | INTRAVENOUS | Status: DC | PRN
  Administered 2022-11-22 (×2): 5 mg via INTRAVENOUS

## 2022-11-22 MED ORDER — CHLORHEXIDINE GLUCONATE 0.12 % MT SOLN
OROMUCOSAL | Status: AC
  Administered 2022-11-22: 15 mL via OROMUCOSAL
  Filled 2022-11-22: qty 15

## 2022-11-22 MED ORDER — PHENYLEPHRINE HCL-NACL 20-0.9 MG/250ML-% IV SOLN
INTRAVENOUS | Status: DC | PRN
  Administered 2022-11-22: 40 ug/min via INTRAVENOUS

## 2022-11-22 MED ORDER — FENTANYL CITRATE (PF) 100 MCG/2ML IJ SOLN
INTRAMUSCULAR | Status: AC
  Filled 2022-11-22: qty 2

## 2022-11-22 MED ORDER — PAPAVERINE HCL 30 MG/ML IJ SOLN
INTRAMUSCULAR | Status: AC
  Filled 2022-11-22: qty 2

## 2022-11-22 MED ORDER — DEXAMETHASONE SODIUM PHOSPHATE 4 MG/ML IJ SOLN
INTRAMUSCULAR | Status: DC | PRN
  Administered 2022-11-22: 4 mg via INTRAVENOUS

## 2022-11-22 MED ORDER — 0.9 % SODIUM CHLORIDE (POUR BTL) OPTIME
TOPICAL | Status: DC | PRN
  Administered 2022-11-22: 1000 mL

## 2022-11-22 MED ORDER — ACETAMINOPHEN 500 MG PO TABS: 1000.0000 mg | ORAL_TABLET | Freq: Once | ORAL | Status: AC

## 2022-11-22 MED ORDER — LIDOCAINE 2% (20 MG/ML) 5 ML SYRINGE
INTRAMUSCULAR | Status: DC | PRN
  Administered 2022-11-22: 60 mg via INTRAVENOUS

## 2022-11-22 MED ORDER — LIDOCAINE-EPINEPHRINE (PF) 1 %-1:200000 IJ SOLN
INTRAMUSCULAR | Status: AC
  Filled 2022-11-22: qty 30

## 2022-11-22 SURGICAL SUPPLY — 33 items
ADH SKN CLS APL DERMABOND .7 (GAUZE/BANDAGES/DRESSINGS) ×1
ARMBAND PINK RESTRICT EXTREMIT (MISCELLANEOUS) ×2 IMPLANT
BAG COUNTER SPONGE SURGICOUNT (BAG) ×2 IMPLANT
BAG SPNG CNTER NS LX DISP (BAG) ×1
CANISTER SUCT 3000ML PPV (MISCELLANEOUS) ×2 IMPLANT
CLIP LIGATING EXTRA MED SLVR (CLIP) ×2 IMPLANT
CLIP LIGATING EXTRA SM BLUE (MISCELLANEOUS) ×2 IMPLANT
COVER PROBE W GEL 5X96 (DRAPES) ×2 IMPLANT
DERMABOND ADVANCED .7 DNX12 (GAUZE/BANDAGES/DRESSINGS) ×2 IMPLANT
ELECT REM PT RETURN 9FT ADLT (ELECTROSURGICAL) ×1
ELECTRODE REM PT RTRN 9FT ADLT (ELECTROSURGICAL) ×2 IMPLANT
GLOVE BIO SURGEON STRL SZ7.5 (GLOVE) ×2 IMPLANT
GLOVE BIOGEL PI IND STRL 7.5 (GLOVE) IMPLANT
GLOVE ECLIPSE 7.0 STRL STRAW (GLOVE) IMPLANT
GOWN STRL REUS W/ TWL LRG LVL3 (GOWN DISPOSABLE) ×4 IMPLANT
GOWN STRL REUS W/ TWL XL LVL3 (GOWN DISPOSABLE) ×2 IMPLANT
GOWN STRL REUS W/TWL LRG LVL3 (GOWN DISPOSABLE) ×2
GOWN STRL REUS W/TWL XL LVL3 (GOWN DISPOSABLE) ×1
KIT BASIN OR (CUSTOM PROCEDURE TRAY) ×2 IMPLANT
KIT TURNOVER KIT B (KITS) ×2 IMPLANT
NS IRRIG 1000ML POUR BTL (IV SOLUTION) ×2 IMPLANT
PACK CV ACCESS (CUSTOM PROCEDURE TRAY) ×2 IMPLANT
PAD ARMBOARD 7.5X6 YLW CONV (MISCELLANEOUS) ×4 IMPLANT
POWDER SURGICEL 3.0 GRAM (HEMOSTASIS) IMPLANT
SUT MNCRL AB 4-0 PS2 18 (SUTURE) ×2 IMPLANT
SUT PROLENE 6 0 BV (SUTURE) ×2 IMPLANT
SUT VIC AB 2-0 CT1 27 (SUTURE) ×1
SUT VIC AB 2-0 CT1 TAPERPNT 27 (SUTURE) IMPLANT
SUT VIC AB 3-0 SH 27 (SUTURE) ×1
SUT VIC AB 3-0 SH 27X BRD (SUTURE) ×2 IMPLANT
TOWEL GREEN STERILE (TOWEL DISPOSABLE) ×2 IMPLANT
UNDERPAD 30X36 HEAVY ABSORB (UNDERPADS AND DIAPERS) ×2 IMPLANT
WATER STERILE IRR 1000ML POUR (IV SOLUTION) ×2 IMPLANT

## 2022-11-22 NOTE — Anesthesia Preprocedure Evaluation (Addendum)
Anesthesia Evaluation  Patient identified by MRN, date of birth, ID band Patient awake    Reviewed: Allergy & Precautions, NPO status , Patient's Chart, lab work & pertinent test results  Airway Mallampati: II  TM Distance: >3 FB Neck ROM: Full    Dental  (+) Missing   Pulmonary neg pulmonary ROS   Pulmonary exam normal        Cardiovascular hypertension, Pt. on medications and Pt. on home beta blockers +CHF  Normal cardiovascular exam     Neuro/Psych negative neurological ROS  negative psych ROS   GI/Hepatic Neg liver ROS,GERD  Medicated and Controlled,,  Endo/Other  diabetes, Insulin Dependent  Morbid obesity  Renal/GU ESRF and DialysisRenal diseaseOn HD T, R, Sat     Musculoskeletal  (+) Arthritis ,    Abdominal  (+) + obese  Peds  Hematology  (+) Blood dyscrasia, anemia   Anesthesia Other Findings ESRD  Reproductive/Obstetrics                             Anesthesia Physical Anesthesia Plan  ASA: 3  Anesthesia Plan: General   Post-op Pain Management:    Induction: Intravenous  PONV Risk Score and Plan: 3 and Ondansetron, Dexamethasone and Treatment may vary due to age or medical condition  Airway Management Planned: Oral ETT  Additional Equipment:   Intra-op Plan:   Post-operative Plan: Extubation in OR  Informed Consent: I have reviewed the patients History and Physical, chart, labs and discussed the procedure including the risks, benefits and alternatives for the proposed anesthesia with the patient or authorized representative who has indicated his/her understanding and acceptance.     Dental advisory given  Plan Discussed with: CRNA  Anesthesia Plan Comments:        Anesthesia Quick Evaluation

## 2022-11-22 NOTE — Discharge Instructions (Signed)
° °  [facility]  ° °Discharge Instructions ° °AV Fistula or Graft Surgery for Dialysis Access ° °Please refer to the following instructions for your post-procedure care. Your surgeon or physician assistant will discuss any changes with you. ° °Activity ° °You may drive the day following your surgery, if you are comfortable and no longer taking prescription pain medication. Resume full activity as the soreness in your incision resolves. ° °Bathing/Showering ° °You may shower after you go home. Keep your incision dry for 48 hours. Do not soak in a bathtub, hot tub, or swim until the incision heals completely. You may not shower if you have a hemodialysis catheter. ° °Incision Care ° °Clean your incision with mild soap and water after 48 hours. Pat the area dry with a clean towel. You do not need a bandage unless otherwise instructed. Do not apply any ointments or creams to your incision. You may have skin glue on your incision. Do not peel it off. It will come off on its own in about one week. Your arm may swell a bit after surgery. To reduce swelling use pillows to elevate your arm so it is above your heart. Your doctor will tell you if you need to lightly wrap your arm with an ACE bandage. ° °Diet ° °Resume your normal diet. There are not special food restrictions following this procedure. In order to heal from your surgery, it is CRITICAL to get adequate nutrition. Your body requires vitamins, minerals, and protein. Vegetables are the best source of vitamins and minerals. Vegetables also provide the perfect balance of protein. Processed food has little nutritional value, so try to avoid this. ° °Medications ° °Resume taking all of your medications. If your incision is causing pain, you may take over-the counter pain relievers such as acetaminophen (Tylenol). If you were prescribed a stronger pain medication, please be aware these medications can cause nausea and constipation. Prevent  nausea by taking the medication with a snack or meal. Avoid constipation by drinking plenty of fluids and eating foods with high amount of fiber, such as fruits, vegetables, and grains. Do not take Tylenol if you are taking prescription pain medications. ° ° ° ° °Follow up °Your surgeon may want to see you in the office following your access surgery. If so, this will be arranged at the time of your surgery. ° °Please call us immediately for any of the following conditions: ° °Increased pain, redness, drainage (pus) from your incision site °Fever of 101 degrees or higher °Severe or worsening pain at your incision site °Hand pain or numbness. ° °Reduce your risk of vascular disease: ° °Stop smoking. If you would like help, call [facility] at 1-800-QUIT-NOW (1-800-784-8669) or [place] at 336-586-4000 ° °Manage your cholesterol °Maintain a desired weight °Control your diabetes °Keep your blood pressure down ° °Dialysis ° °It will take several weeks to several months for your new dialysis access to be ready for use. Your surgeon will determine when it is OK to use it. Your nephrologist will continue to direct your dialysis. You can continue to use your Permcath until your new access is ready for use. ° °If you have any questions, please call the office at [phone]. ° °

## 2022-11-22 NOTE — Anesthesia Procedure Notes (Signed)
Procedure Name: Intubation Date/Time: 11/22/2022 11:17 AM  Performed by: Lieutenant Diego, CRNAPre-anesthesia Checklist: Patient identified, Emergency Drugs available, Suction available and Patient being monitored Patient Re-evaluated:Patient Re-evaluated prior to induction Oxygen Delivery Method: Circle system utilized Preoxygenation: Pre-oxygenation with 100% oxygen Induction Type: IV induction Ventilation: Mask ventilation without difficulty Laryngoscope Size: Miller and 2 Grade View: Grade I Tube type: Oral Tube size: 7.0 mm Number of attempts: 1 Airway Equipment and Method: Stylet Placement Confirmation: ETT inserted through vocal cords under direct vision, positive ETCO2 and breath sounds checked- equal and bilateral Secured at: 22 cm Tube secured with: Tape Dental Injury: Teeth and Oropharynx as per pre-operative assessment

## 2022-11-22 NOTE — Op Note (Signed)
    Patient name: Latasha [name] MRN: [id number] DOB: 04/19/1956 Sex: female  11/22/2022 Pre-operative Diagnosis: End-stage renal disease, malfunction right arm AV fistula Post-operative diagnosis:  Same Surgeon:  Eda Paschal. Donzetta Matters, MD Assistant: Laurence Slate, PA Procedure Performed: Revision of right arm cephalic vein fistula with superficialization  Indications: 67 year old female with end-stage renal disease currently dialyzing via catheter.  She has a cephalic vein fistula in the right arm which is too deep for cannulation is now indicated for revision with superficialization versus transposition.  Findings: Fistula was very large measured approximately 7 mm and there were multiple large branches which were divided and the fistula was superficial lysed just under the layer of dermis as it would not transposed laterally given the short length and deep nature.     Procedure:  The patient was identified in the holding area and taken to the operating room where she was placed upon operative table and LMA anesthesia was induced.  She was sterilely prepped and draped in the right lower extremity usual fashion, antibiotics were administered and a timeout was called.  We began using ultrasound to evaluate the fistula throughout the upper arm multiple branches were identified.  We then made 2 longitudinal incisions dissected the fistula for the entirety of the upper arm dividing branches between ties.  Given that the fascia was too short to transpose laterally elected to superficial eyes and closed the soft tissue below the fistula with interrupted 2-0 Vicryl suture.  The skin was then closed with 4-0 Monocryl.  Dermabond is placed at the skin site.  Patient was awakened from anesthesia having tolerated procedure without any complication but all counts were correct at completion.  An experienced assistant was necessary to facilitate exposure of the fistula and superficialization.  EBL: 50 cc    [name]  C. Donzetta Matters, MD [facility] [place] Office: [phone] Pager: [phone]

## 2022-11-22 NOTE — Transfer of Care (Signed)
Immediate Anesthesia Transfer of Care Note  Patient: Latasha [name]  Procedure(s) Performed: RIGHT ARM ARTERIOVENOUS FISTULA SUPERFICIALIZATION (Right)  Patient Location: PACU  Anesthesia Type:General  Level of Consciousness: awake, alert , and oriented  Airway & Oxygen Therapy: Patient Spontanous Breathing and Patient connected to nasal cannula oxygen  Post-op Assessment: Report given to RN and Post -op Vital signs reviewed and stable  Post vital signs: Reviewed and stable  Last Vitals:  Vitals Value Taken Time  BP 106/54 11/22/22 1215  Temp    Pulse 60 11/22/22 1215  Resp 17 11/22/22 1215  SpO2 100 % 11/22/22 1215  Vitals shown include unvalidated device data.  Last Pain:  Vitals:   11/22/22 1029  TempSrc:   PainSc: 0-No pain         Complications: No notable events documented.

## 2022-11-22 NOTE — Interval H&P Note (Signed)
History and Physical Interval Note:  11/22/2022 10:34 AM  Latasha [name]  has presented today for surgery, with the diagnosis of ESRD.  The various methods of treatment have been discussed with the patient and family. After consideration of risks, benefits and other options for treatment, the patient has consented to  Procedure(s): RIGHT ARM ARTERIOVENOUS FISTULA SUPERFICIALIZATION (Right) as a surgical intervention.  The patient's history has been reviewed, patient examined, no change in status, stable for surgery.  I have reviewed the patient's chart and labs.  Questions were answered to the patient's satisfaction.     Servando Snare

## 2022-11-23 NOTE — Anesthesia Postprocedure Evaluation (Signed)
Anesthesia Post Note  Patient: BREANNIA PHILIPS  Procedure(s) Performed: RIGHT ARM ARTERIOVENOUS FISTULA SUPERFICIALIZATION (Right)     Patient location during evaluation: PACU Anesthesia Type: General Level of consciousness: awake Pain management: pain level controlled Vital Signs Assessment: post-procedure vital signs reviewed and stable Respiratory status: spontaneous breathing, nonlabored ventilation and respiratory function stable Cardiovascular status: blood pressure returned to baseline and stable Postop Assessment: no apparent nausea or vomiting Anesthetic complications: no   No notable events documented.  Last Vitals:  Vitals:   11/22/22 1230 11/22/22 1245  BP: (!) 113/58 (!) 118/58  Pulse: (!) 58 (!) 59  Resp: 15 20  Temp:  37.2 C  SpO2: 96% 95%    Last Pain:  Vitals:   11/22/22 1245  TempSrc:   PainSc: 3                  Janis Cuffe P Domenique Southers

## 2022-11-24 ENCOUNTER — Encounter (HOSPITAL_COMMUNITY): Payer: Self-pay | Admitting: Vascular Surgery

## 2022-11-26 DIAGNOSIS — Z992 Dependence on renal dialysis: Secondary | ICD-10-CM | POA: Diagnosis not present

## 2022-11-26 DIAGNOSIS — N2581 Secondary hyperparathyroidism of renal origin: Secondary | ICD-10-CM | POA: Diagnosis not present

## 2022-11-26 DIAGNOSIS — N186 End stage renal disease: Secondary | ICD-10-CM | POA: Diagnosis not present

## 2022-11-28 DIAGNOSIS — Z992 Dependence on renal dialysis: Secondary | ICD-10-CM | POA: Diagnosis not present

## 2022-11-28 DIAGNOSIS — N186 End stage renal disease: Secondary | ICD-10-CM | POA: Diagnosis not present

## 2022-11-28 DIAGNOSIS — N2581 Secondary hyperparathyroidism of renal origin: Secondary | ICD-10-CM | POA: Diagnosis not present

## 2022-11-29 ENCOUNTER — Ambulatory Visit: Payer: Self-pay

## 2022-11-29 DIAGNOSIS — H35373 Puckering of macula, bilateral: Secondary | ICD-10-CM | POA: Diagnosis not present

## 2022-11-29 DIAGNOSIS — H3582 Retinal ischemia: Secondary | ICD-10-CM | POA: Diagnosis not present

## 2022-11-29 DIAGNOSIS — E113513 Type 2 diabetes mellitus with proliferative diabetic retinopathy with macular edema, bilateral: Secondary | ICD-10-CM | POA: Diagnosis not present

## 2022-11-29 NOTE — Patient Outreach (Signed)
  Care Coordination   11/29/2022 Name: Latasha [name] MRN: [name] DOB: December 20, 1955   Care Coordination Outreach Attempts:  An unsuccessful telephone outreach was attempted today to offer the patient information about available care coordination services as a benefit of their health plan.   Follow Up Plan:  Additional outreach attempts will be made to offer the patient care coordination information and services.   Encounter Outcome:  No Answer   Care Coordination Interventions:  No, not indicated    Jone Baseman, RN, MSN [place] [facility] Care Management Coordinator Direct Line [phone]

## 2022-11-30 DIAGNOSIS — Z992 Dependence on renal dialysis: Secondary | ICD-10-CM | POA: Diagnosis not present

## 2022-11-30 DIAGNOSIS — N2581 Secondary hyperparathyroidism of renal origin: Secondary | ICD-10-CM | POA: Diagnosis not present

## 2022-11-30 DIAGNOSIS — N186 End stage renal disease: Secondary | ICD-10-CM | POA: Diagnosis not present

## 2022-12-03 DIAGNOSIS — N186 End stage renal disease: Secondary | ICD-10-CM | POA: Diagnosis not present

## 2022-12-03 DIAGNOSIS — N2581 Secondary hyperparathyroidism of renal origin: Secondary | ICD-10-CM | POA: Diagnosis not present

## 2022-12-03 DIAGNOSIS — Z992 Dependence on renal dialysis: Secondary | ICD-10-CM | POA: Diagnosis not present

## 2022-12-05 DIAGNOSIS — N186 End stage renal disease: Secondary | ICD-10-CM | POA: Diagnosis not present

## 2022-12-05 DIAGNOSIS — Z992 Dependence on renal dialysis: Secondary | ICD-10-CM | POA: Diagnosis not present

## 2022-12-05 DIAGNOSIS — I129 Hypertensive chronic kidney disease with stage 1 through stage 4 chronic kidney disease, or unspecified chronic kidney disease: Secondary | ICD-10-CM | POA: Diagnosis not present

## 2022-12-07 DIAGNOSIS — N186 End stage renal disease: Secondary | ICD-10-CM | POA: Diagnosis not present

## 2022-12-07 DIAGNOSIS — Z992 Dependence on renal dialysis: Secondary | ICD-10-CM | POA: Diagnosis not present

## 2022-12-07 DIAGNOSIS — N2581 Secondary hyperparathyroidism of renal origin: Secondary | ICD-10-CM | POA: Diagnosis not present

## 2022-12-10 ENCOUNTER — Other Ambulatory Visit: Payer: Self-pay

## 2022-12-10 DIAGNOSIS — N186 End stage renal disease: Secondary | ICD-10-CM

## 2022-12-10 DIAGNOSIS — Z992 Dependence on renal dialysis: Secondary | ICD-10-CM | POA: Diagnosis not present

## 2022-12-10 DIAGNOSIS — N2581 Secondary hyperparathyroidism of renal origin: Secondary | ICD-10-CM | POA: Diagnosis not present

## 2022-12-13 DIAGNOSIS — N186 End stage renal disease: Secondary | ICD-10-CM | POA: Diagnosis not present

## 2022-12-13 DIAGNOSIS — N2581 Secondary hyperparathyroidism of renal origin: Secondary | ICD-10-CM | POA: Diagnosis not present

## 2022-12-13 DIAGNOSIS — Z992 Dependence on renal dialysis: Secondary | ICD-10-CM | POA: Diagnosis not present

## 2022-12-14 DIAGNOSIS — N186 End stage renal disease: Secondary | ICD-10-CM | POA: Diagnosis not present

## 2022-12-14 DIAGNOSIS — Z992 Dependence on renal dialysis: Secondary | ICD-10-CM | POA: Diagnosis not present

## 2022-12-14 DIAGNOSIS — N2581 Secondary hyperparathyroidism of renal origin: Secondary | ICD-10-CM | POA: Diagnosis not present

## 2022-12-17 DIAGNOSIS — N2581 Secondary hyperparathyroidism of renal origin: Secondary | ICD-10-CM | POA: Diagnosis not present

## 2022-12-17 DIAGNOSIS — N186 End stage renal disease: Secondary | ICD-10-CM | POA: Diagnosis not present

## 2022-12-17 DIAGNOSIS — Z992 Dependence on renal dialysis: Secondary | ICD-10-CM | POA: Diagnosis not present

## 2022-12-18 ENCOUNTER — Ambulatory Visit: Payer: Self-pay

## 2022-12-18 NOTE — Patient Outreach (Signed)
  Care Coordination   12/18/2022 Name: Latasha [name] MRN: [id number] DOB: 1956-01-22   Care Coordination Outreach Attempts:  An unsuccessful telephone outreach was attempted today to offer the patient information about available care coordination services as a benefit of their health plan.   Follow Up Plan:  Additional outreach attempts will be made to offer the patient care coordination information and services.   Encounter Outcome:  No Answer   Care Coordination Interventions:  No, not indicated    Jone Baseman, RN, MSN [place] [facility] Care Management Coordinator Direct Line [phone]

## 2022-12-19 DIAGNOSIS — Z992 Dependence on renal dialysis: Secondary | ICD-10-CM | POA: Diagnosis not present

## 2022-12-19 DIAGNOSIS — N186 End stage renal disease: Secondary | ICD-10-CM | POA: Diagnosis not present

## 2022-12-19 DIAGNOSIS — N2581 Secondary hyperparathyroidism of renal origin: Secondary | ICD-10-CM | POA: Diagnosis not present

## 2022-12-20 ENCOUNTER — Telehealth: Payer: Self-pay | Admitting: *Deleted

## 2022-12-20 NOTE — Progress Notes (Unsigned)
  Care Coordination Note  12/20/2022 Name: Latasha [name] MRN: [name] DOB: 27-Jan-1956  Latasha [name] is a 67 y.o. year old female who is a primary care patient of Shirline Frees, MD and is actively engaged with the care management team. I reached out to Latasha [name] by phone today to assist with re-scheduling a follow up visit with the RN Case Manager  Follow up plan: Unsuccessful telephone outreach attempt made. A HIPAA compliant phone message was left for the patient providing contact information and requesting a return call.   [place]  Direct Dial: [phone]

## 2022-12-21 DIAGNOSIS — Z992 Dependence on renal dialysis: Secondary | ICD-10-CM | POA: Diagnosis not present

## 2022-12-21 DIAGNOSIS — N186 End stage renal disease: Secondary | ICD-10-CM | POA: Diagnosis not present

## 2022-12-21 DIAGNOSIS — N2581 Secondary hyperparathyroidism of renal origin: Secondary | ICD-10-CM | POA: Diagnosis not present

## 2022-12-24 DIAGNOSIS — N186 End stage renal disease: Secondary | ICD-10-CM | POA: Diagnosis not present

## 2022-12-24 DIAGNOSIS — Z992 Dependence on renal dialysis: Secondary | ICD-10-CM | POA: Diagnosis not present

## 2022-12-24 DIAGNOSIS — N2581 Secondary hyperparathyroidism of renal origin: Secondary | ICD-10-CM | POA: Diagnosis not present

## 2022-12-24 NOTE — Progress Notes (Signed)
  Care Coordination Note  12/24/2022 Name: Latasha [name] MRN: [id number] DOB: 04-08-1956  Latasha [name] is a 67 y.o. year old female who is a primary care patient of Shirline Frees, MD and is actively engaged with the care management team. I reached out to Latasha [name] by phone today to assist with re-scheduling a follow up visit with the RN Case Manager  Follow up plan: Unsuccessful telephone outreach attempt made. We have been unable to make contact with the patient for follow up. The care management team is available to follow up with the patient after provider conversation with the patient regarding recommendation for care management engagement and subsequent re-referral to the care management team.   [place]  Direct Dial: [phone]

## 2022-12-24 NOTE — Patient Outreach (Signed)
  Care Coordination   Case Closure  Visit Note   12/24/2022 Name: Latasha [name] MRN: [id number] DOB: Sep 26, 1956  Latasha [name] is a 67 y.o. year old female who sees Shirline Frees, MD for primary care. I  received a message from Care guide that patient has been unable to reach.  CM will close case.   What matters to the patients health and wellness today?  N/A    Goals Addressed             This Visit's Progress    COMPLETED: Maintain Hemodialysis       Care Coordination Interventions: Patient has not been able to engage. RN CM closing case.          SDOH assessments and interventions completed:  No     Care Coordination Interventions:  No, not indicated   Follow up plan: No further intervention required.   Encounter Outcome:  RN CM closing case.   Jone Baseman, RN, MSN [place] [facility] Care Management Coordinator Direct Line [phone]

## 2022-12-25 ENCOUNTER — Ambulatory Visit (INDEPENDENT_AMBULATORY_CARE_PROVIDER_SITE_OTHER): Payer: Medicare HMO | Admitting: Physician Assistant

## 2022-12-25 ENCOUNTER — Ambulatory Visit (HOSPITAL_COMMUNITY)
Admission: RE | Admit: 2022-12-25 | Discharge: 2022-12-25 | Disposition: A | Discharge status: Home / Self Care | Payer: Medicare HMO | Source: Ambulatory Visit | Attending: Vascular Surgery | Admitting: Vascular Surgery

## 2022-12-25 VITALS — BP 140/63 | HR 66 | Temp 98.0°F | Wt 271.0 lb

## 2022-12-25 DIAGNOSIS — N186 End stage renal disease: Secondary | ICD-10-CM | POA: Diagnosis not present

## 2022-12-25 NOTE — Progress Notes (Signed)
    Postoperative Access Visit   History of Present Illness   Latasha [name] is a 67 y.o. year old female who presents for postoperative follow-up for: right  superficialization of the brachial cephalic fistula  (Date: [id number]).  The patient's wounds are healed.  The patient denies steal symptoms.  The patient is able to complete their activities of daily living.  She is currently dialyzing via right IJ [facility] on a Tuesday Thursday Saturday schedule at the [employer] location  Physical Examination   Vitals:   12/25/22 1336  BP: (!) 140/63  Pulse: 66  Temp: 98 F (36.7 C)  TempSrc: Temporal  SpO2: 92%  Weight: 271 lb (122.9 kg)   Body mass index is 46.52 kg/m.  right arm Incisions are healed, 1+ radial pulse, hand grip is 5/5, sensation in digits is intact, palpable thrill, bruit can be auscultated     Medical Decision Making   Latasha [name] is a 67 y.o. year old female who presents s/p right brachiocephalic arteriovenous fistula revision by superficialization  Patent AVF without signs or symptoms of steal syndrome The patient's access will be ready for use Tuesday 12/31/22 The patient's tunneled dialysis catheter can be removed when Nephrology is comfortable with the performance of the fistula The patient may follow up on a prn basis   Latasha Ligas PA-C [facility] Office: [phone]  Clinic MD: Donzetta Matters

## 2022-12-26 DIAGNOSIS — Z992 Dependence on renal dialysis: Secondary | ICD-10-CM | POA: Diagnosis not present

## 2022-12-26 DIAGNOSIS — N186 End stage renal disease: Secondary | ICD-10-CM | POA: Diagnosis not present

## 2022-12-26 DIAGNOSIS — N2581 Secondary hyperparathyroidism of renal origin: Secondary | ICD-10-CM | POA: Diagnosis not present

## 2022-12-31 DIAGNOSIS — E1122 Type 2 diabetes mellitus with diabetic chronic kidney disease: Secondary | ICD-10-CM | POA: Diagnosis not present

## 2023-01-04 DIAGNOSIS — N186 End stage renal disease: Secondary | ICD-10-CM | POA: Diagnosis not present

## 2023-01-04 DIAGNOSIS — N2581 Secondary hyperparathyroidism of renal origin: Secondary | ICD-10-CM | POA: Diagnosis not present

## 2023-01-04 DIAGNOSIS — Z992 Dependence on renal dialysis: Secondary | ICD-10-CM | POA: Diagnosis not present

## 2023-01-05 DIAGNOSIS — I129 Hypertensive chronic kidney disease with stage 1 through stage 4 chronic kidney disease, or unspecified chronic kidney disease: Secondary | ICD-10-CM | POA: Diagnosis not present

## 2023-01-05 DIAGNOSIS — Z992 Dependence on renal dialysis: Secondary | ICD-10-CM | POA: Diagnosis not present

## 2023-01-05 DIAGNOSIS — N186 End stage renal disease: Secondary | ICD-10-CM | POA: Diagnosis not present

## 2023-01-07 DIAGNOSIS — N2581 Secondary hyperparathyroidism of renal origin: Secondary | ICD-10-CM | POA: Diagnosis not present

## 2023-01-07 DIAGNOSIS — N186 End stage renal disease: Secondary | ICD-10-CM | POA: Diagnosis not present

## 2023-01-07 DIAGNOSIS — Z992 Dependence on renal dialysis: Secondary | ICD-10-CM | POA: Diagnosis not present

## 2023-01-09 DIAGNOSIS — N2581 Secondary hyperparathyroidism of renal origin: Secondary | ICD-10-CM | POA: Diagnosis not present

## 2023-01-09 DIAGNOSIS — Z992 Dependence on renal dialysis: Secondary | ICD-10-CM | POA: Diagnosis not present

## 2023-01-09 DIAGNOSIS — N186 End stage renal disease: Secondary | ICD-10-CM | POA: Diagnosis not present

## 2023-01-11 DIAGNOSIS — N186 End stage renal disease: Secondary | ICD-10-CM | POA: Diagnosis not present

## 2023-01-11 DIAGNOSIS — Z992 Dependence on renal dialysis: Secondary | ICD-10-CM | POA: Diagnosis not present

## 2023-01-11 DIAGNOSIS — N2581 Secondary hyperparathyroidism of renal origin: Secondary | ICD-10-CM | POA: Diagnosis not present

## 2023-01-14 DIAGNOSIS — N186 End stage renal disease: Secondary | ICD-10-CM | POA: Diagnosis not present

## 2023-01-14 DIAGNOSIS — Z992 Dependence on renal dialysis: Secondary | ICD-10-CM | POA: Diagnosis not present

## 2023-01-14 DIAGNOSIS — N2581 Secondary hyperparathyroidism of renal origin: Secondary | ICD-10-CM | POA: Diagnosis not present

## 2023-01-16 DIAGNOSIS — Z992 Dependence on renal dialysis: Secondary | ICD-10-CM | POA: Diagnosis not present

## 2023-01-16 DIAGNOSIS — N186 End stage renal disease: Secondary | ICD-10-CM | POA: Diagnosis not present

## 2023-01-16 DIAGNOSIS — N2581 Secondary hyperparathyroidism of renal origin: Secondary | ICD-10-CM | POA: Diagnosis not present

## 2023-01-18 DIAGNOSIS — N186 End stage renal disease: Secondary | ICD-10-CM | POA: Diagnosis not present

## 2023-01-18 DIAGNOSIS — N2581 Secondary hyperparathyroidism of renal origin: Secondary | ICD-10-CM | POA: Diagnosis not present

## 2023-01-18 DIAGNOSIS — Z992 Dependence on renal dialysis: Secondary | ICD-10-CM | POA: Diagnosis not present

## 2023-01-21 DIAGNOSIS — N2581 Secondary hyperparathyroidism of renal origin: Secondary | ICD-10-CM | POA: Diagnosis not present

## 2023-01-21 DIAGNOSIS — N186 End stage renal disease: Secondary | ICD-10-CM | POA: Diagnosis not present

## 2023-01-21 DIAGNOSIS — Z992 Dependence on renal dialysis: Secondary | ICD-10-CM | POA: Diagnosis not present

## 2023-01-22 DIAGNOSIS — Z452 Encounter for adjustment and management of vascular access device: Secondary | ICD-10-CM | POA: Diagnosis not present

## 2023-01-22 DIAGNOSIS — N186 End stage renal disease: Secondary | ICD-10-CM | POA: Diagnosis not present

## 2023-01-22 DIAGNOSIS — Z992 Dependence on renal dialysis: Secondary | ICD-10-CM | POA: Diagnosis not present

## 2023-01-25 DIAGNOSIS — Z992 Dependence on renal dialysis: Secondary | ICD-10-CM | POA: Diagnosis not present

## 2023-01-25 DIAGNOSIS — N186 End stage renal disease: Secondary | ICD-10-CM | POA: Diagnosis not present

## 2023-01-25 DIAGNOSIS — N2581 Secondary hyperparathyroidism of renal origin: Secondary | ICD-10-CM | POA: Diagnosis not present

## 2023-01-28 DIAGNOSIS — N186 End stage renal disease: Secondary | ICD-10-CM | POA: Diagnosis not present

## 2023-01-28 DIAGNOSIS — Z992 Dependence on renal dialysis: Secondary | ICD-10-CM | POA: Diagnosis not present

## 2023-01-28 DIAGNOSIS — N2581 Secondary hyperparathyroidism of renal origin: Secondary | ICD-10-CM | POA: Diagnosis not present

## 2023-01-30 DIAGNOSIS — N186 End stage renal disease: Secondary | ICD-10-CM | POA: Diagnosis not present

## 2023-01-30 DIAGNOSIS — Z992 Dependence on renal dialysis: Secondary | ICD-10-CM | POA: Diagnosis not present

## 2023-01-30 DIAGNOSIS — N2581 Secondary hyperparathyroidism of renal origin: Secondary | ICD-10-CM | POA: Diagnosis not present

## 2023-02-01 DIAGNOSIS — N2581 Secondary hyperparathyroidism of renal origin: Secondary | ICD-10-CM | POA: Diagnosis not present

## 2023-02-01 DIAGNOSIS — N186 End stage renal disease: Secondary | ICD-10-CM | POA: Diagnosis not present

## 2023-02-01 DIAGNOSIS — Z992 Dependence on renal dialysis: Secondary | ICD-10-CM | POA: Diagnosis not present

## 2023-02-04 DIAGNOSIS — Z992 Dependence on renal dialysis: Secondary | ICD-10-CM | POA: Diagnosis not present

## 2023-02-04 DIAGNOSIS — I129 Hypertensive chronic kidney disease with stage 1 through stage 4 chronic kidney disease, or unspecified chronic kidney disease: Secondary | ICD-10-CM | POA: Diagnosis not present

## 2023-02-04 DIAGNOSIS — N186 End stage renal disease: Secondary | ICD-10-CM | POA: Diagnosis not present

## 2023-02-06 DIAGNOSIS — Z992 Dependence on renal dialysis: Secondary | ICD-10-CM | POA: Diagnosis not present

## 2023-02-06 DIAGNOSIS — N2581 Secondary hyperparathyroidism of renal origin: Secondary | ICD-10-CM | POA: Diagnosis not present

## 2023-02-06 DIAGNOSIS — N186 End stage renal disease: Secondary | ICD-10-CM | POA: Diagnosis not present

## 2023-02-07 DIAGNOSIS — E1122 Type 2 diabetes mellitus with diabetic chronic kidney disease: Secondary | ICD-10-CM | POA: Diagnosis not present

## 2023-02-07 DIAGNOSIS — E1165 Type 2 diabetes mellitus with hyperglycemia: Secondary | ICD-10-CM | POA: Diagnosis not present

## 2023-02-07 DIAGNOSIS — E1140 Type 2 diabetes mellitus with diabetic neuropathy, unspecified: Secondary | ICD-10-CM | POA: Diagnosis not present

## 2023-02-07 DIAGNOSIS — I10 Essential (primary) hypertension: Secondary | ICD-10-CM | POA: Diagnosis not present

## 2023-02-07 DIAGNOSIS — I5032 Chronic diastolic (congestive) heart failure: Secondary | ICD-10-CM | POA: Diagnosis not present

## 2023-02-07 DIAGNOSIS — I132 Hypertensive heart and chronic kidney disease with heart failure and with stage 5 chronic kidney disease, or end stage renal disease: Secondary | ICD-10-CM | POA: Diagnosis not present

## 2023-02-07 DIAGNOSIS — Z992 Dependence on renal dialysis: Secondary | ICD-10-CM | POA: Diagnosis not present

## 2023-02-07 DIAGNOSIS — N186 End stage renal disease: Secondary | ICD-10-CM | POA: Diagnosis not present

## 2023-02-07 DIAGNOSIS — Z794 Long term (current) use of insulin: Secondary | ICD-10-CM | POA: Diagnosis not present

## 2023-02-07 DIAGNOSIS — E7800 Pure hypercholesterolemia, unspecified: Secondary | ICD-10-CM | POA: Diagnosis not present

## 2023-02-08 DIAGNOSIS — Z992 Dependence on renal dialysis: Secondary | ICD-10-CM | POA: Diagnosis not present

## 2023-02-08 DIAGNOSIS — N186 End stage renal disease: Secondary | ICD-10-CM | POA: Diagnosis not present

## 2023-02-08 DIAGNOSIS — N2581 Secondary hyperparathyroidism of renal origin: Secondary | ICD-10-CM | POA: Diagnosis not present

## 2023-02-11 DIAGNOSIS — Z992 Dependence on renal dialysis: Secondary | ICD-10-CM | POA: Diagnosis not present

## 2023-02-11 DIAGNOSIS — N186 End stage renal disease: Secondary | ICD-10-CM | POA: Diagnosis not present

## 2023-02-11 DIAGNOSIS — N2581 Secondary hyperparathyroidism of renal origin: Secondary | ICD-10-CM | POA: Diagnosis not present

## 2023-02-13 DIAGNOSIS — Z992 Dependence on renal dialysis: Secondary | ICD-10-CM | POA: Diagnosis not present

## 2023-02-13 DIAGNOSIS — N186 End stage renal disease: Secondary | ICD-10-CM | POA: Diagnosis not present

## 2023-02-13 DIAGNOSIS — N2581 Secondary hyperparathyroidism of renal origin: Secondary | ICD-10-CM | POA: Diagnosis not present

## 2023-02-15 DIAGNOSIS — N2581 Secondary hyperparathyroidism of renal origin: Secondary | ICD-10-CM | POA: Diagnosis not present

## 2023-02-15 DIAGNOSIS — N186 End stage renal disease: Secondary | ICD-10-CM | POA: Diagnosis not present

## 2023-02-15 DIAGNOSIS — Z992 Dependence on renal dialysis: Secondary | ICD-10-CM | POA: Diagnosis not present

## 2023-02-20 DIAGNOSIS — N186 End stage renal disease: Secondary | ICD-10-CM | POA: Diagnosis not present

## 2023-02-20 DIAGNOSIS — Z992 Dependence on renal dialysis: Secondary | ICD-10-CM | POA: Diagnosis not present

## 2023-02-20 DIAGNOSIS — N2581 Secondary hyperparathyroidism of renal origin: Secondary | ICD-10-CM | POA: Diagnosis not present

## 2023-02-22 DIAGNOSIS — N186 End stage renal disease: Secondary | ICD-10-CM | POA: Diagnosis not present

## 2023-02-22 DIAGNOSIS — N2581 Secondary hyperparathyroidism of renal origin: Secondary | ICD-10-CM | POA: Diagnosis not present

## 2023-02-22 DIAGNOSIS — Z992 Dependence on renal dialysis: Secondary | ICD-10-CM | POA: Diagnosis not present

## 2023-02-27 DIAGNOSIS — Z992 Dependence on renal dialysis: Secondary | ICD-10-CM | POA: Diagnosis not present

## 2023-02-27 DIAGNOSIS — N2581 Secondary hyperparathyroidism of renal origin: Secondary | ICD-10-CM | POA: Diagnosis not present

## 2023-02-27 DIAGNOSIS — N186 End stage renal disease: Secondary | ICD-10-CM | POA: Diagnosis not present

## 2023-03-01 DIAGNOSIS — N186 End stage renal disease: Secondary | ICD-10-CM | POA: Diagnosis not present

## 2023-03-01 DIAGNOSIS — N2581 Secondary hyperparathyroidism of renal origin: Secondary | ICD-10-CM | POA: Diagnosis not present

## 2023-03-01 DIAGNOSIS — Z992 Dependence on renal dialysis: Secondary | ICD-10-CM | POA: Diagnosis not present

## 2023-03-04 DIAGNOSIS — N2581 Secondary hyperparathyroidism of renal origin: Secondary | ICD-10-CM | POA: Diagnosis not present

## 2023-03-04 DIAGNOSIS — Z992 Dependence on renal dialysis: Secondary | ICD-10-CM | POA: Diagnosis not present

## 2023-03-04 DIAGNOSIS — N186 End stage renal disease: Secondary | ICD-10-CM | POA: Diagnosis not present

## 2023-03-07 DIAGNOSIS — I129 Hypertensive chronic kidney disease with stage 1 through stage 4 chronic kidney disease, or unspecified chronic kidney disease: Secondary | ICD-10-CM | POA: Diagnosis not present

## 2023-03-07 DIAGNOSIS — N186 End stage renal disease: Secondary | ICD-10-CM | POA: Diagnosis not present

## 2023-03-07 DIAGNOSIS — Z992 Dependence on renal dialysis: Secondary | ICD-10-CM | POA: Diagnosis not present

## 2023-03-08 DIAGNOSIS — N2581 Secondary hyperparathyroidism of renal origin: Secondary | ICD-10-CM | POA: Diagnosis not present

## 2023-03-08 DIAGNOSIS — Z992 Dependence on renal dialysis: Secondary | ICD-10-CM | POA: Diagnosis not present

## 2023-03-08 DIAGNOSIS — N186 End stage renal disease: Secondary | ICD-10-CM | POA: Diagnosis not present

## 2023-03-11 DIAGNOSIS — Z992 Dependence on renal dialysis: Secondary | ICD-10-CM | POA: Diagnosis not present

## 2023-03-11 DIAGNOSIS — N2581 Secondary hyperparathyroidism of renal origin: Secondary | ICD-10-CM | POA: Diagnosis not present

## 2023-03-11 DIAGNOSIS — N186 End stage renal disease: Secondary | ICD-10-CM | POA: Diagnosis not present

## 2023-03-15 DIAGNOSIS — N2581 Secondary hyperparathyroidism of renal origin: Secondary | ICD-10-CM | POA: Diagnosis not present

## 2023-03-15 DIAGNOSIS — N186 End stage renal disease: Secondary | ICD-10-CM | POA: Diagnosis not present

## 2023-03-15 DIAGNOSIS — Z992 Dependence on renal dialysis: Secondary | ICD-10-CM | POA: Diagnosis not present

## 2023-03-18 DIAGNOSIS — Z992 Dependence on renal dialysis: Secondary | ICD-10-CM | POA: Diagnosis not present

## 2023-03-18 DIAGNOSIS — N186 End stage renal disease: Secondary | ICD-10-CM | POA: Diagnosis not present

## 2023-03-18 DIAGNOSIS — N2581 Secondary hyperparathyroidism of renal origin: Secondary | ICD-10-CM | POA: Diagnosis not present

## 2023-03-22 DIAGNOSIS — Z992 Dependence on renal dialysis: Secondary | ICD-10-CM | POA: Diagnosis not present

## 2023-03-22 DIAGNOSIS — N2581 Secondary hyperparathyroidism of renal origin: Secondary | ICD-10-CM | POA: Diagnosis not present

## 2023-03-22 DIAGNOSIS — N186 End stage renal disease: Secondary | ICD-10-CM | POA: Diagnosis not present

## 2023-03-25 DIAGNOSIS — Z992 Dependence on renal dialysis: Secondary | ICD-10-CM | POA: Diagnosis not present

## 2023-03-25 DIAGNOSIS — N2581 Secondary hyperparathyroidism of renal origin: Secondary | ICD-10-CM | POA: Diagnosis not present

## 2023-03-25 DIAGNOSIS — N186 End stage renal disease: Secondary | ICD-10-CM | POA: Diagnosis not present

## 2023-03-29 DIAGNOSIS — N2581 Secondary hyperparathyroidism of renal origin: Secondary | ICD-10-CM | POA: Diagnosis not present

## 2023-03-29 DIAGNOSIS — Z992 Dependence on renal dialysis: Secondary | ICD-10-CM | POA: Diagnosis not present

## 2023-03-29 DIAGNOSIS — N186 End stage renal disease: Secondary | ICD-10-CM | POA: Diagnosis not present

## 2023-03-31 DIAGNOSIS — E1122 Type 2 diabetes mellitus with diabetic chronic kidney disease: Secondary | ICD-10-CM | POA: Diagnosis not present

## 2023-04-01 DIAGNOSIS — Z992 Dependence on renal dialysis: Secondary | ICD-10-CM | POA: Diagnosis not present

## 2023-04-01 DIAGNOSIS — N2581 Secondary hyperparathyroidism of renal origin: Secondary | ICD-10-CM | POA: Diagnosis not present

## 2023-04-01 DIAGNOSIS — N186 End stage renal disease: Secondary | ICD-10-CM | POA: Diagnosis not present

## 2023-04-05 DIAGNOSIS — N186 End stage renal disease: Secondary | ICD-10-CM | POA: Diagnosis not present

## 2023-04-05 DIAGNOSIS — N2581 Secondary hyperparathyroidism of renal origin: Secondary | ICD-10-CM | POA: Diagnosis not present

## 2023-04-05 DIAGNOSIS — Z992 Dependence on renal dialysis: Secondary | ICD-10-CM | POA: Diagnosis not present

## 2023-04-06 DIAGNOSIS — Z992 Dependence on renal dialysis: Secondary | ICD-10-CM | POA: Diagnosis not present

## 2023-04-06 DIAGNOSIS — I129 Hypertensive chronic kidney disease with stage 1 through stage 4 chronic kidney disease, or unspecified chronic kidney disease: Secondary | ICD-10-CM | POA: Diagnosis not present

## 2023-04-06 DIAGNOSIS — N186 End stage renal disease: Secondary | ICD-10-CM | POA: Diagnosis not present

## 2023-04-08 DIAGNOSIS — N2581 Secondary hyperparathyroidism of renal origin: Secondary | ICD-10-CM | POA: Diagnosis not present

## 2023-04-08 DIAGNOSIS — Z992 Dependence on renal dialysis: Secondary | ICD-10-CM | POA: Diagnosis not present

## 2023-04-08 DIAGNOSIS — N186 End stage renal disease: Secondary | ICD-10-CM | POA: Diagnosis not present

## 2023-04-12 DIAGNOSIS — N2581 Secondary hyperparathyroidism of renal origin: Secondary | ICD-10-CM | POA: Diagnosis not present

## 2023-04-12 DIAGNOSIS — Z992 Dependence on renal dialysis: Secondary | ICD-10-CM | POA: Diagnosis not present

## 2023-04-12 DIAGNOSIS — N186 End stage renal disease: Secondary | ICD-10-CM | POA: Diagnosis not present

## 2023-04-15 DIAGNOSIS — Z992 Dependence on renal dialysis: Secondary | ICD-10-CM | POA: Diagnosis not present

## 2023-04-15 DIAGNOSIS — N2581 Secondary hyperparathyroidism of renal origin: Secondary | ICD-10-CM | POA: Diagnosis not present

## 2023-04-15 DIAGNOSIS — N186 End stage renal disease: Secondary | ICD-10-CM | POA: Diagnosis not present

## 2023-04-19 DIAGNOSIS — N186 End stage renal disease: Secondary | ICD-10-CM | POA: Diagnosis not present

## 2023-04-19 DIAGNOSIS — N2581 Secondary hyperparathyroidism of renal origin: Secondary | ICD-10-CM | POA: Diagnosis not present

## 2023-04-19 DIAGNOSIS — Z992 Dependence on renal dialysis: Secondary | ICD-10-CM | POA: Diagnosis not present

## 2023-04-22 DIAGNOSIS — Z992 Dependence on renal dialysis: Secondary | ICD-10-CM | POA: Diagnosis not present

## 2023-04-22 DIAGNOSIS — N2581 Secondary hyperparathyroidism of renal origin: Secondary | ICD-10-CM | POA: Diagnosis not present

## 2023-04-22 DIAGNOSIS — N186 End stage renal disease: Secondary | ICD-10-CM | POA: Diagnosis not present

## 2023-04-29 DIAGNOSIS — N186 End stage renal disease: Secondary | ICD-10-CM | POA: Diagnosis not present

## 2023-04-29 DIAGNOSIS — N2581 Secondary hyperparathyroidism of renal origin: Secondary | ICD-10-CM | POA: Diagnosis not present

## 2023-04-29 DIAGNOSIS — Z992 Dependence on renal dialysis: Secondary | ICD-10-CM | POA: Diagnosis not present

## 2023-05-02 DIAGNOSIS — Z992 Dependence on renal dialysis: Secondary | ICD-10-CM | POA: Diagnosis not present

## 2023-05-02 DIAGNOSIS — R350 Frequency of micturition: Secondary | ICD-10-CM | POA: Diagnosis not present

## 2023-05-03 DIAGNOSIS — N2581 Secondary hyperparathyroidism of renal origin: Secondary | ICD-10-CM | POA: Diagnosis not present

## 2023-05-03 DIAGNOSIS — Z992 Dependence on renal dialysis: Secondary | ICD-10-CM | POA: Diagnosis not present

## 2023-05-03 DIAGNOSIS — N186 End stage renal disease: Secondary | ICD-10-CM | POA: Diagnosis not present

## 2023-05-07 DIAGNOSIS — I129 Hypertensive chronic kidney disease with stage 1 through stage 4 chronic kidney disease, or unspecified chronic kidney disease: Secondary | ICD-10-CM | POA: Diagnosis not present

## 2023-05-07 DIAGNOSIS — N186 End stage renal disease: Secondary | ICD-10-CM | POA: Diagnosis not present

## 2023-05-07 DIAGNOSIS — N2581 Secondary hyperparathyroidism of renal origin: Secondary | ICD-10-CM | POA: Diagnosis not present

## 2023-05-07 DIAGNOSIS — Z992 Dependence on renal dialysis: Secondary | ICD-10-CM | POA: Diagnosis not present

## 2023-05-10 DIAGNOSIS — Z992 Dependence on renal dialysis: Secondary | ICD-10-CM | POA: Diagnosis not present

## 2023-05-10 DIAGNOSIS — N2581 Secondary hyperparathyroidism of renal origin: Secondary | ICD-10-CM | POA: Diagnosis not present

## 2023-05-10 DIAGNOSIS — N186 End stage renal disease: Secondary | ICD-10-CM | POA: Diagnosis not present

## 2023-05-13 DIAGNOSIS — N2581 Secondary hyperparathyroidism of renal origin: Secondary | ICD-10-CM | POA: Diagnosis not present

## 2023-05-13 DIAGNOSIS — N186 End stage renal disease: Secondary | ICD-10-CM | POA: Diagnosis not present

## 2023-05-13 DIAGNOSIS — Z992 Dependence on renal dialysis: Secondary | ICD-10-CM | POA: Diagnosis not present

## 2023-05-20 DIAGNOSIS — N2581 Secondary hyperparathyroidism of renal origin: Secondary | ICD-10-CM | POA: Diagnosis not present

## 2023-05-20 DIAGNOSIS — N186 End stage renal disease: Secondary | ICD-10-CM | POA: Diagnosis not present

## 2023-05-20 DIAGNOSIS — Z992 Dependence on renal dialysis: Secondary | ICD-10-CM | POA: Diagnosis not present

## 2023-05-27 DIAGNOSIS — N2581 Secondary hyperparathyroidism of renal origin: Secondary | ICD-10-CM | POA: Diagnosis not present

## 2023-05-27 DIAGNOSIS — Z992 Dependence on renal dialysis: Secondary | ICD-10-CM | POA: Diagnosis not present

## 2023-05-27 DIAGNOSIS — N186 End stage renal disease: Secondary | ICD-10-CM | POA: Diagnosis not present

## 2023-05-31 DIAGNOSIS — N2581 Secondary hyperparathyroidism of renal origin: Secondary | ICD-10-CM | POA: Diagnosis not present

## 2023-05-31 DIAGNOSIS — N186 End stage renal disease: Secondary | ICD-10-CM | POA: Diagnosis not present

## 2023-05-31 DIAGNOSIS — Z992 Dependence on renal dialysis: Secondary | ICD-10-CM | POA: Diagnosis not present

## 2023-06-07 DIAGNOSIS — Z992 Dependence on renal dialysis: Secondary | ICD-10-CM | POA: Diagnosis not present

## 2023-06-07 DIAGNOSIS — N2581 Secondary hyperparathyroidism of renal origin: Secondary | ICD-10-CM | POA: Diagnosis not present

## 2023-06-07 DIAGNOSIS — N186 End stage renal disease: Secondary | ICD-10-CM | POA: Diagnosis not present

## 2023-06-07 DIAGNOSIS — I129 Hypertensive chronic kidney disease with stage 1 through stage 4 chronic kidney disease, or unspecified chronic kidney disease: Secondary | ICD-10-CM | POA: Diagnosis not present

## 2023-06-10 DIAGNOSIS — Z992 Dependence on renal dialysis: Secondary | ICD-10-CM | POA: Diagnosis not present

## 2023-06-10 DIAGNOSIS — N2581 Secondary hyperparathyroidism of renal origin: Secondary | ICD-10-CM | POA: Diagnosis not present

## 2023-06-10 DIAGNOSIS — N186 End stage renal disease: Secondary | ICD-10-CM | POA: Diagnosis not present

## 2023-06-13 ENCOUNTER — Other Ambulatory Visit: Payer: Self-pay | Admitting: Family Medicine

## 2023-06-13 DIAGNOSIS — Z1231 Encounter for screening mammogram for malignant neoplasm of breast: Secondary | ICD-10-CM

## 2023-06-14 DIAGNOSIS — N2581 Secondary hyperparathyroidism of renal origin: Secondary | ICD-10-CM | POA: Diagnosis not present

## 2023-06-14 DIAGNOSIS — Z992 Dependence on renal dialysis: Secondary | ICD-10-CM | POA: Diagnosis not present

## 2023-06-14 DIAGNOSIS — N186 End stage renal disease: Secondary | ICD-10-CM | POA: Diagnosis not present

## 2023-06-18 DIAGNOSIS — N186 End stage renal disease: Secondary | ICD-10-CM | POA: Diagnosis not present

## 2023-06-18 DIAGNOSIS — N2581 Secondary hyperparathyroidism of renal origin: Secondary | ICD-10-CM | POA: Diagnosis not present

## 2023-06-18 DIAGNOSIS — Z992 Dependence on renal dialysis: Secondary | ICD-10-CM | POA: Diagnosis not present

## 2023-06-21 DIAGNOSIS — N186 End stage renal disease: Secondary | ICD-10-CM | POA: Diagnosis not present

## 2023-06-21 DIAGNOSIS — Z992 Dependence on renal dialysis: Secondary | ICD-10-CM | POA: Diagnosis not present

## 2023-06-21 DIAGNOSIS — N2581 Secondary hyperparathyroidism of renal origin: Secondary | ICD-10-CM | POA: Diagnosis not present

## 2023-06-24 DIAGNOSIS — N186 End stage renal disease: Secondary | ICD-10-CM | POA: Diagnosis not present

## 2023-06-24 DIAGNOSIS — N2581 Secondary hyperparathyroidism of renal origin: Secondary | ICD-10-CM | POA: Diagnosis not present

## 2023-06-24 DIAGNOSIS — Z992 Dependence on renal dialysis: Secondary | ICD-10-CM | POA: Diagnosis not present

## 2023-06-27 DIAGNOSIS — N186 End stage renal disease: Secondary | ICD-10-CM | POA: Diagnosis not present

## 2023-06-27 DIAGNOSIS — E1122 Type 2 diabetes mellitus with diabetic chronic kidney disease: Secondary | ICD-10-CM | POA: Diagnosis not present

## 2023-06-27 DIAGNOSIS — E1165 Type 2 diabetes mellitus with hyperglycemia: Secondary | ICD-10-CM | POA: Diagnosis not present

## 2023-06-27 DIAGNOSIS — Z7985 Long-term (current) use of injectable non-insulin antidiabetic drugs: Secondary | ICD-10-CM | POA: Diagnosis not present

## 2023-06-27 DIAGNOSIS — Z992 Dependence on renal dialysis: Secondary | ICD-10-CM | POA: Diagnosis not present

## 2023-06-27 DIAGNOSIS — E1142 Type 2 diabetes mellitus with diabetic polyneuropathy: Secondary | ICD-10-CM | POA: Diagnosis not present

## 2023-06-27 DIAGNOSIS — Z978 Presence of other specified devices: Secondary | ICD-10-CM | POA: Diagnosis not present

## 2023-06-27 DIAGNOSIS — Z794 Long term (current) use of insulin: Secondary | ICD-10-CM | POA: Diagnosis not present

## 2023-06-28 DIAGNOSIS — N2581 Secondary hyperparathyroidism of renal origin: Secondary | ICD-10-CM | POA: Diagnosis not present

## 2023-06-28 DIAGNOSIS — N186 End stage renal disease: Secondary | ICD-10-CM | POA: Diagnosis not present

## 2023-06-28 DIAGNOSIS — Z992 Dependence on renal dialysis: Secondary | ICD-10-CM | POA: Diagnosis not present

## 2023-06-29 DIAGNOSIS — E1122 Type 2 diabetes mellitus with diabetic chronic kidney disease: Secondary | ICD-10-CM | POA: Diagnosis not present

## 2023-07-01 DIAGNOSIS — N2581 Secondary hyperparathyroidism of renal origin: Secondary | ICD-10-CM | POA: Diagnosis not present

## 2023-07-01 DIAGNOSIS — N186 End stage renal disease: Secondary | ICD-10-CM | POA: Diagnosis not present

## 2023-07-01 DIAGNOSIS — Z992 Dependence on renal dialysis: Secondary | ICD-10-CM | POA: Diagnosis not present

## 2023-07-05 DIAGNOSIS — N2581 Secondary hyperparathyroidism of renal origin: Secondary | ICD-10-CM | POA: Diagnosis not present

## 2023-07-05 DIAGNOSIS — Z992 Dependence on renal dialysis: Secondary | ICD-10-CM | POA: Diagnosis not present

## 2023-07-05 DIAGNOSIS — N186 End stage renal disease: Secondary | ICD-10-CM | POA: Diagnosis not present

## 2023-07-07 DIAGNOSIS — I129 Hypertensive chronic kidney disease with stage 1 through stage 4 chronic kidney disease, or unspecified chronic kidney disease: Secondary | ICD-10-CM | POA: Diagnosis not present

## 2023-07-07 DIAGNOSIS — Z992 Dependence on renal dialysis: Secondary | ICD-10-CM | POA: Diagnosis not present

## 2023-07-07 DIAGNOSIS — N186 End stage renal disease: Secondary | ICD-10-CM | POA: Diagnosis not present

## 2023-07-08 DIAGNOSIS — Z992 Dependence on renal dialysis: Secondary | ICD-10-CM | POA: Diagnosis not present

## 2023-07-08 DIAGNOSIS — N2581 Secondary hyperparathyroidism of renal origin: Secondary | ICD-10-CM | POA: Diagnosis not present

## 2023-07-08 DIAGNOSIS — N186 End stage renal disease: Secondary | ICD-10-CM | POA: Diagnosis not present

## 2023-07-15 DIAGNOSIS — N186 End stage renal disease: Secondary | ICD-10-CM | POA: Diagnosis not present

## 2023-07-15 DIAGNOSIS — N2581 Secondary hyperparathyroidism of renal origin: Secondary | ICD-10-CM | POA: Diagnosis not present

## 2023-07-15 DIAGNOSIS — Z992 Dependence on renal dialysis: Secondary | ICD-10-CM | POA: Diagnosis not present

## 2023-07-19 DIAGNOSIS — N2581 Secondary hyperparathyroidism of renal origin: Secondary | ICD-10-CM | POA: Diagnosis not present

## 2023-07-19 DIAGNOSIS — Z992 Dependence on renal dialysis: Secondary | ICD-10-CM | POA: Diagnosis not present

## 2023-07-19 DIAGNOSIS — N186 End stage renal disease: Secondary | ICD-10-CM | POA: Diagnosis not present

## 2023-07-22 DIAGNOSIS — N2581 Secondary hyperparathyroidism of renal origin: Secondary | ICD-10-CM | POA: Diagnosis not present

## 2023-07-22 DIAGNOSIS — Z992 Dependence on renal dialysis: Secondary | ICD-10-CM | POA: Diagnosis not present

## 2023-07-22 DIAGNOSIS — N186 End stage renal disease: Secondary | ICD-10-CM | POA: Diagnosis not present

## 2023-07-26 DIAGNOSIS — N186 End stage renal disease: Secondary | ICD-10-CM | POA: Diagnosis not present

## 2023-07-26 DIAGNOSIS — N2581 Secondary hyperparathyroidism of renal origin: Secondary | ICD-10-CM | POA: Diagnosis not present

## 2023-07-26 DIAGNOSIS — Z992 Dependence on renal dialysis: Secondary | ICD-10-CM | POA: Diagnosis not present

## 2023-07-31 DIAGNOSIS — N186 End stage renal disease: Secondary | ICD-10-CM | POA: Diagnosis not present

## 2023-07-31 DIAGNOSIS — N2581 Secondary hyperparathyroidism of renal origin: Secondary | ICD-10-CM | POA: Diagnosis not present

## 2023-07-31 DIAGNOSIS — Z992 Dependence on renal dialysis: Secondary | ICD-10-CM | POA: Diagnosis not present

## 2023-08-02 DIAGNOSIS — N2581 Secondary hyperparathyroidism of renal origin: Secondary | ICD-10-CM | POA: Diagnosis not present

## 2023-08-02 DIAGNOSIS — Z992 Dependence on renal dialysis: Secondary | ICD-10-CM | POA: Diagnosis not present

## 2023-08-02 DIAGNOSIS — N186 End stage renal disease: Secondary | ICD-10-CM | POA: Diagnosis not present

## 2023-08-04 ENCOUNTER — Ambulatory Visit
Admission: RE | Admit: 2023-08-04 | Discharge: 2023-08-04 | Disposition: A | Discharge status: Home / Self Care | Payer: Medicare HMO | Source: Ambulatory Visit | Attending: Family Medicine | Admitting: Family Medicine

## 2023-08-04 ENCOUNTER — Other Ambulatory Visit: Payer: Self-pay | Admitting: Family Medicine

## 2023-08-04 DIAGNOSIS — Z1231 Encounter for screening mammogram for malignant neoplasm of breast: Secondary | ICD-10-CM

## 2023-08-04 DIAGNOSIS — N63 Unspecified lump in unspecified breast: Secondary | ICD-10-CM

## 2023-08-05 DIAGNOSIS — Z992 Dependence on renal dialysis: Secondary | ICD-10-CM | POA: Diagnosis not present

## 2023-08-05 DIAGNOSIS — N2581 Secondary hyperparathyroidism of renal origin: Secondary | ICD-10-CM | POA: Diagnosis not present

## 2023-08-05 DIAGNOSIS — N186 End stage renal disease: Secondary | ICD-10-CM | POA: Diagnosis not present

## 2023-08-07 DIAGNOSIS — Z992 Dependence on renal dialysis: Secondary | ICD-10-CM | POA: Diagnosis not present

## 2023-08-07 DIAGNOSIS — I129 Hypertensive chronic kidney disease with stage 1 through stage 4 chronic kidney disease, or unspecified chronic kidney disease: Secondary | ICD-10-CM | POA: Diagnosis not present

## 2023-08-07 DIAGNOSIS — N186 End stage renal disease: Secondary | ICD-10-CM | POA: Diagnosis not present

## 2023-08-12 ENCOUNTER — Emergency Department (HOSPITAL_COMMUNITY): Payer: Medicare HMO

## 2023-08-12 ENCOUNTER — Encounter (HOSPITAL_COMMUNITY): Payer: Self-pay | Admitting: Emergency Medicine

## 2023-08-12 ENCOUNTER — Observation Stay (HOSPITAL_COMMUNITY)
Admission: EM | Admit: 2023-08-12 | Discharge: 2023-08-13 | Disposition: A | Discharge status: Home / Self Care | Payer: Medicare HMO | Attending: Internal Medicine | Admitting: Internal Medicine

## 2023-08-12 ENCOUNTER — Other Ambulatory Visit: Payer: Self-pay

## 2023-08-12 DIAGNOSIS — E8770 Fluid overload, unspecified: Secondary | ICD-10-CM | POA: Diagnosis present

## 2023-08-12 DIAGNOSIS — I517 Cardiomegaly: Secondary | ICD-10-CM | POA: Diagnosis not present

## 2023-08-12 DIAGNOSIS — R918 Other nonspecific abnormal finding of lung field: Secondary | ICD-10-CM | POA: Diagnosis not present

## 2023-08-12 DIAGNOSIS — I132 Hypertensive heart and chronic kidney disease with heart failure and with stage 5 chronic kidney disease, or end stage renal disease: Secondary | ICD-10-CM | POA: Diagnosis not present

## 2023-08-12 DIAGNOSIS — I5033 Acute on chronic diastolic (congestive) heart failure: Secondary | ICD-10-CM | POA: Diagnosis not present

## 2023-08-12 DIAGNOSIS — E785 Hyperlipidemia, unspecified: Secondary | ICD-10-CM | POA: Diagnosis present

## 2023-08-12 DIAGNOSIS — N186 End stage renal disease: Secondary | ICD-10-CM | POA: Insufficient documentation

## 2023-08-12 DIAGNOSIS — I10 Essential (primary) hypertension: Secondary | ICD-10-CM | POA: Diagnosis present

## 2023-08-12 DIAGNOSIS — K219 Gastro-esophageal reflux disease without esophagitis: Secondary | ICD-10-CM | POA: Diagnosis present

## 2023-08-12 DIAGNOSIS — Z79899 Other long term (current) drug therapy: Secondary | ICD-10-CM | POA: Diagnosis not present

## 2023-08-12 DIAGNOSIS — R0989 Other specified symptoms and signs involving the circulatory and respiratory systems: Secondary | ICD-10-CM | POA: Diagnosis not present

## 2023-08-12 DIAGNOSIS — E1122 Type 2 diabetes mellitus with diabetic chronic kidney disease: Secondary | ICD-10-CM | POA: Insufficient documentation

## 2023-08-12 DIAGNOSIS — Z992 Dependence on renal dialysis: Secondary | ICD-10-CM | POA: Insufficient documentation

## 2023-08-12 DIAGNOSIS — R0602 Shortness of breath: Secondary | ICD-10-CM | POA: Diagnosis not present

## 2023-08-12 DIAGNOSIS — D649 Anemia, unspecified: Secondary | ICD-10-CM | POA: Diagnosis present

## 2023-08-12 DIAGNOSIS — E66813 Obesity, class 3: Secondary | ICD-10-CM | POA: Diagnosis present

## 2023-08-12 LAB — HEPATIC FUNCTION PANEL
ALT: 24 U/L (ref 0–44)
ALT: 25 U/L (ref 0–44)
AST: 15 U/L (ref 15–41)
AST: 17 U/L (ref 15–41)
Albumin: 3.8 g/dL (ref 3.5–5.0)
Albumin: 3.5 g/dL (ref 3.5–5.0)
Alkaline Phosphatase: 68 U/L (ref 38–126)
Alkaline Phosphatase: 75 U/L (ref 38–126)
Bilirubin, Direct: 0.1 mg/dL (ref 0.0–0.2)
Bilirubin, Direct: 0.2 mg/dL (ref 0.0–0.2)
Indirect Bilirubin: 0.6 mg/dL (ref 0.3–0.9)
Indirect Bilirubin: 0.5 mg/dL (ref 0.3–0.9)
Total Bilirubin: 0.7 mg/dL (ref <1.2)
Total Bilirubin: 0.7 mg/dL (ref <1.2)
Total Protein: 7.4 g/dL (ref 6.5–8.1)
Total Protein: 6.9 g/dL (ref 6.5–8.1)

## 2023-08-12 LAB — MRSA NEXT GEN BY PCR, NASAL: MRSA by PCR Next Gen: NOT DETECTED

## 2023-08-12 LAB — CBC WITH DIFFERENTIAL/PLATELET
Abs Immature Granulocytes: 0.02 10*3/uL (ref 0.00–0.07)
Basophils Absolute: 0 10*3/uL (ref 0.0–0.1)
Basophils Relative: 1 %
Eosinophils Absolute: 0.2 10*3/uL (ref 0.0–0.5)
Eosinophils Relative: 4 %
HCT: 30.1 % — ABNORMAL LOW (ref 36.0–46.0)
Hemoglobin: 9.9 g/dL — ABNORMAL LOW (ref 12.0–15.0)
Immature Granulocytes: 0 %
Lymphocytes Relative: 31 %
Lymphs Abs: 1.7 10*3/uL (ref 0.7–4.0)
MCH: 29.8 pg (ref 26.0–34.0)
MCHC: 32.9 g/dL (ref 30.0–36.0)
MCV: 90.7 fL (ref 80.0–100.0)
Monocytes Absolute: 0.4 10*3/uL (ref 0.1–1.0)
Monocytes Relative: 8 %
Neutro Abs: 3 10*3/uL (ref 1.7–7.7)
Neutrophils Relative %: 56 %
Platelets: 196 10*3/uL (ref 150–400)
RBC: 3.32 MIL/uL — ABNORMAL LOW (ref 3.87–5.11)
RDW: 14.7 % (ref 11.5–15.5)
WBC: 5.3 10*3/uL (ref 4.0–10.5)
nRBC: 0 % (ref 0.0–0.2)

## 2023-08-12 LAB — BASIC METABOLIC PANEL
Anion gap: 12 (ref 5–15)
BUN: 96 mg/dL — ABNORMAL HIGH (ref 8–23)
CO2: 24 mmol/L (ref 22–32)
Calcium: 7.9 mg/dL — ABNORMAL LOW (ref 8.9–10.3)
Chloride: 103 mmol/L (ref 98–111)
Creatinine, Ser: 3.95 mg/dL — ABNORMAL HIGH (ref 0.44–1.00)
GFR, Estimated: 12 mL/min — ABNORMAL LOW (ref >60)
Glucose, Bld: 151 mg/dL — ABNORMAL HIGH (ref 70–99)
Potassium: 4.2 mmol/L (ref 3.5–5.1)
Sodium: 139 mmol/L (ref 135–145)

## 2023-08-12 LAB — BRAIN NATRIURETIC PEPTIDE: B Natriuretic Peptide: 144.2 pg/mL — ABNORMAL HIGH (ref 0.0–100.0)

## 2023-08-12 LAB — GLUCOSE, CAPILLARY
Glucose-Capillary: 214 mg/dL — ABNORMAL HIGH (ref 70–99)
Glucose-Capillary: 221 mg/dL — ABNORMAL HIGH (ref 70–99)

## 2023-08-12 LAB — TROPONIN I (HIGH SENSITIVITY)
Troponin I (High Sensitivity): 15 ng/L (ref <18)
Troponin I (High Sensitivity): 16 ng/L (ref <18)

## 2023-08-12 MED ORDER — ACETAMINOPHEN 650 MG RE SUPP: 650.0000 mg | Freq: Four times a day (QID) | RECTAL | Status: DC | PRN

## 2023-08-12 MED ORDER — PROCHLORPERAZINE EDISYLATE 10 MG/2ML IJ SOLN: 10.0000 mg | Freq: Four times a day (QID) | INTRAMUSCULAR | Status: DC | PRN

## 2023-08-12 MED ORDER — MELATONIN 5 MG PO TABS
5.0000 mg | ORAL_TABLET | Freq: Every day | ORAL | Status: DC
  Administered 2023-08-12: 5 mg via ORAL
  Filled 2023-08-12: qty 1

## 2023-08-12 MED ORDER — ACETAMINOPHEN 325 MG PO TABS
650.0000 mg | ORAL_TABLET | Freq: Four times a day (QID) | ORAL | Status: DC | PRN
  Administered 2023-08-12 – 2023-08-13 (×3): 650 mg via ORAL
  Filled 2023-08-12 (×3): qty 2

## 2023-08-12 MED ORDER — HEPARIN SODIUM (PORCINE) 5000 UNIT/ML IJ SOLN
5000.0000 [IU] | Freq: Three times a day (TID) | INTRAMUSCULAR | Status: DC
  Administered 2023-08-12 – 2023-08-13 (×3): 5000 [IU] via SUBCUTANEOUS
  Filled 2023-08-12 (×4): qty 1

## 2023-08-12 MED ORDER — FUROSEMIDE 10 MG/ML IJ SOLN
40.0000 mg | Freq: Once | INTRAMUSCULAR | Status: AC
  Administered 2023-08-12: 40 mg via INTRAVENOUS
  Filled 2023-08-12: qty 4

## 2023-08-12 MED ORDER — INSULIN ASPART 100 UNIT/ML IJ SOLN
0.0000 [IU] | Freq: Three times a day (TID) | INTRAMUSCULAR | Status: DC
  Administered 2023-08-12: 3 [IU] via SUBCUTANEOUS
  Administered 2023-08-13 (×2): 2 [IU] via SUBCUTANEOUS

## 2023-08-12 NOTE — Progress Notes (Signed)
NEW ADMISSION NOTE New Admission Note:   Arrival Method: stretcher Mental Orientation: x4 Telemetry: box 11 Assessment: Completed Skin: intact IV: Saline locked Pain: denies Tubes: none present Safety Measures: Safety Fall Prevention Plan has been given, discussed and signed Admission: Completed [facility] Orientation: Patient has been orientated to the room, unit and staff.  Family: notified by patient  Orders have been reviewed and implemented. Will continue to monitor the patient. Call light has been placed within reach and bed alarm has been activated.   Velia Meyer, RN

## 2023-08-12 NOTE — Plan of Care (Signed)
  Problem: Nutrition: Goal: Adequate nutrition will be maintained Outcome: Progressing   Problem: Coping: Goal: Level of anxiety will decrease Outcome: Progressing   

## 2023-08-12 NOTE — ED Triage Notes (Signed)
Patient arrives in wheelchair by POV c/o shortness of breath and heart palpitations x 1 week. States symptoms worsened last night. Patient speaking in full sentences. States she was supposed to go to dialysis today but came here instead. Has dialysis twice a week usually Tuesday and Saturdays however missed Saturday as well due to vertigo.

## 2023-08-12 NOTE — H&P (Signed)
History and Physical    Patient: Latasha [name] LKG:[id number] DOB: 01-Sep-1956 DOA: 08/12/2023 DOS: the patient was seen and examined on 08/12/2023 PCP: Noberto Retort, MD  Patient coming from: Home  Chief Complaint:  Chief Complaint  Patient presents with   Shortness of Breath   HPI: Latasha [name] is a 67 y.o. female with medical history significant of BPPV, chronic diastolic heart failure, insulin requiring type II DM, class III obesity, hyperlipidemia, hypertension, left atrial enlargement, palpitations, history of pneumonia, ESRD on hemodialysis who presented to the emergency department at [facility] with dyspnea after missing dialysis on Saturday and earlier today.  She initially wanted to sign AMA, but subsequently agreed to be admitted and transferred to [facility] to the hemodialysis unit.  Positive dyspnea, lower extremity edema, but no chest pain, palpitations or diaphoresis.  Lab work: CBC showed a white count of 5.3, hemoglobin 9.9 g/deciliter and platelets 196.  Troponin x 2 normal, BMP 144.2 pg/mL.  BMP showed a glucose 151, BUN 96, creatinine 3.95 and calcium 7.9 mg/dL.  The rest of the electrolytes were normal.  Imaging: 2 view chest radiograph showing mild cardiomegaly with mild central pulmonary vascular congestion and possible minimal bilateral pulmonary edema.   ED course: Initial vital signs were temperature 98.6 F, pulse 63, respiration 20, BP 138/70 mmHg O2 sat 100% on room air.  The patient received 40 mg of furosemide IVP.  Review of Systems: As mentioned in the history of present illness. All other systems reviewed and are negative. Past Medical History:  Diagnosis Date   Anemia    Arthritis    BPPV (benign paroxysmal positional vertigo)    Chronic congestive heart failure with left ventricular diastolic dysfunction (HCC)    CKD (chronic kidney disease), stage IV (HCC) 05/15/2017   Diabetes mellitus    Dyspnea    ESRD (end stage renal disease)  (HCC)    [facility] - [address]   History of blood transfusion    Hx of transfusion of packed red blood cells    Hyperlipidemia    Hypertension    Left atrial enlargement    Morbid obesity with BMI of 40.0-44.9, adult (HCC)    Obstructive sleep apnea 01/22/2022   Pt denies having this diagnosis, as she did not have a proper study   Palpitations    Pneumonia    Past Surgical History:  Procedure Laterality Date   ABDOMINAL HYSTERECTOMY     AV FISTULA PLACEMENT Right 07/12/2022   Procedure: RIGHT ARM ARTERIOVENOUS (AV) FISTULA CREATION;  Surgeon: Maeola Harman, MD;  Location: [facility] OR;  Service: Vascular;  Laterality: Right;   AV FISTULA PLACEMENT Right 09/27/2022   Procedure: RIGHT UPPER EXTREMITY ARTERIOVENOUS (AV) FISTULA;  Surgeon: Maeola Harman, MD;  Location: [facility] OR;  Service: Vascular;  Laterality: Right;   BIOPSY  11/09/2020   Procedure: BIOPSY;  Surgeon: Charlott Rakes, MD;  Location: [facility] ENDOSCOPY;  Service: Endoscopy;;   CAPD INSERTION N/A 05/17/2022   Procedure: LAPAROSCOPIC INSERTION CONTINUOUS AMBULATORY PERITONEAL DIALYSIS CATHETER;  Surgeon: Maeola Harman, MD;  Location: [facility] OR;  Service: Vascular;  Laterality: N/A;   CAPD REMOVAL Right 07/12/2022   Procedure: REMOVAL OF CONTINUOUS AMBULATORY PERITONEAL DIALYSIS  (CAPD) CATHETER;  Surgeon: Maeola Harman, MD;  Location: [facility] OR;  Service: Vascular;  Laterality: Right;   DILATION AND CURETTAGE OF UTERUS     ESOPHAGOGASTRODUODENOSCOPY N/A 11/09/2020   Procedure: ESOPHAGOGASTRODUODENOSCOPY (EGD);  Surgeon: Charlott Rakes, MD;  Location: [facility] ENDOSCOPY;  Service: Endoscopy;  Laterality: N/A;   EYE SURGERY     cataract/left eye, right eye had bleeding in back   FISTULA SUPERFICIALIZATION Right 11/22/2022   Procedure: RIGHT ARM ARTERIOVENOUS FISTULA SUPERFICIALIZATION;  Surgeon: Maeola Harman, MD;  Location: [facility] OR;  Service: Vascular;  Laterality: Right;   IR FLUORO GUIDE  CV LINE RIGHT  10/26/2020   IR US GUIDE VASC ACCESS RIGHT  10/26/2020   KNEE SURGERY     LEFT HEART CATH AND CORONARY ANGIOGRAPHY N/A 03/09/2018   Procedure: LEFT HEART CATH AND CORONARY ANGIOGRAPHY;  Surgeon: Runell Gess, MD;  Location: [facility] INVASIVE CV LAB;  Service: Cardiovascular;  Laterality: N/A;   THROMBECTOMY W/ EMBOLECTOMY Right 09/27/2022   Procedure: THROMBECTOMY ARTERIOVENOUS FISTULA;  Surgeon: Maeola Harman, MD;  Location: [facility] OR;  Service: Vascular;  Laterality: Right;   TRIGGER FINGER RELEASE     TUBAL LIGATION     Social History:  reports that she has never smoked. She has never been exposed to tobacco smoke. She has never used smokeless tobacco. She reports current alcohol use. She reports that she does not use drugs.  Allergies  Allergen Reactions   Hydrocodone-Acetaminophen Other (See Comments)    Unknown reaction   Oxycodone Hcl Other (See Comments)    Unknown reaction   Mounjaro [Tirzepatide] Diarrhea   Ozempic (0.25 Or 0.5 Mg-Dose) [Semaglutide(0.25 Or 0.5mg -Dos)] Nausea And Vomiting   Amitriptyline Nausea Only   Celexa [Citalopram] Other (See Comments)    Nightmares     Family History  Problem Relation Age of Onset   Heart disease Father    Hypertension Father    Alzheimer's disease Mother    Diabetes Mother    Diabetes Sister    Dementia Other     Prior to Admission medications   Medication Sig Start Date End Date Taking? Authorizing Provider  acetaminophen (TYLENOL) 650 MG CR tablet Take 650 mg by mouth every 8 (eight) hours as needed for pain.    [provider]  amLODipine (NORVASC) 10 MG tablet Take 1 tablet (10 mg total) by mouth daily. Patient taking differently: Take 5-10 mg by mouth See admin instructions. Take 5 mg after dialysis on Tues, Thurs, and Sat Take 10 mg on Sun, Mon, Wed, and Fri 05/18/17   Rodolph Bong, MD  atorvastatin (LIPITOR) 40 MG tablet Take 40 mg by mouth in the morning.    [provider]  B Complex-C-Zn-Folic Acid (DIALYVITE 800/ZINC PO) Take 1 tablet by mouth daily.    [provider]  carvedilol (COREG) 25 MG tablet Take 1 tablet (25 mg total) by mouth 2 (two) times daily with a meal. 03/10/18   Albertine Grates, MD  gabapentin (NEURONTIN) 100 MG capsule Take 300 mg by mouth at bedtime. 03/04/22   [provider]  lactulose (CHRONULAC) 10 GM/15ML solution Take 30 g by mouth 2 (two) times daily as needed for moderate constipation. 06/22/22   [provider]  NOVOLOG MIX 70/30 FLEXPEN (70-30) 100 UNIT/ML FlexPen Inject 50-60 Units into the skin See admin instructions. Inject 50 units into the skin before breakfast and 60-65 unit before dinner on Sun, Mon, Wed, and Fri (non-dialysis days only) 07/22/22   [provider]  tiZANidine (ZANAFLEX) 2 MG tablet Take 2 mg by mouth 3 (three) times daily as needed for muscle spasms. 03/29/22   [provider]  torsemide (DEMADEX) 100 MG tablet Take 100 mg by mouth daily.    [provider]  traMADol (ULTRAM) 50 MG tablet Take 1 tablet (50 mg total) by mouth every 6 (six) hours as needed. 11/22/22 11/22/23  Lars Mage, PA-C    Physical Exam: Vitals:   08/12/23 0836 08/12/23 0843 08/12/23 1004 08/12/23 1100  BP: 138/70 99/87 (!) 145/53 138/63  Pulse: 63 63 (!) 141 60  Resp: 20 20 11  (!) 24  Temp: 98.6 F (37 C)  97.8 F (36.6 C)   TempSrc: Oral  Oral   SpO2: 100% 94% 96% 94%  Weight: 115.2 kg     Height: 5\' 4"  (1.626 m)      Physical Exam Vitals and nursing note reviewed.  Constitutional:      General: She is awake. She is not in acute distress.    Appearance: She is well-developed. She is obese. She is ill-appearing.  HENT:     Head: Normocephalic.     Nose: No rhinorrhea.     Mouth/Throat:     Mouth: Mucous membranes are moist.  Eyes:     General: No scleral icterus.    Pupils: Pupils are equal, round, and reactive to light.  Neck:     Vascular: No JVD.  Cardiovascular:      Rate and Rhythm: Normal rate and regular rhythm.     Heart sounds: S1 normal and S2 normal.  Pulmonary:     Effort: No tachypnea.     Breath sounds: Examination of the right-lower field reveals rales. Examination of the left-lower field reveals rales. Rales present. No wheezing or rhonchi.  Abdominal:     General: Bowel sounds are normal.     Palpations: Abdomen is soft.     Tenderness: There is no abdominal tenderness.  Musculoskeletal:     Cervical back: Neck supple.     Right lower leg: 1+ Pitting Edema present.     Left lower leg: 1+ Pitting Edema present.  Skin:    General: Skin is warm and dry.  Neurological:     General: No focal deficit present.     Mental Status: She is alert and oriented to person, place, and time.  Psychiatric:        Mood and Affect: Mood normal.        Behavior: Behavior normal. Behavior is cooperative.     Data Reviewed:  Results are pending, will review when available. 03/18/2022 transthoracic echocardiogram. IMPRESSIONS:   1. Left ventricular ejection fraction, by estimation, is 60 to 65%. The left ventricle has normal function. The left ventricle has no regional wall motion abnormalities. There is moderate concentric left ventricular hypertrophy. Left ventricular diastolic parameters are consistent with Grade II diastolic dysfunction (pseudonormalization). Elevated left ventricular end-diastolic pressure. The E/e' is 15.9.  2. Right ventricular systolic function is normal. The right ventricular size is normal. There is normal pulmonary artery systolic pressure. The estimated right ventricular systolic pressure is 31.5 mmHg.  3. Left atrial size was mildly dilated.  4. The mitral valve is grossly normal. Mild mitral valve regurgitation. No evidence of mitral stenosis.  5. The aortic valve is tricuspid. Aortic valve regurgitation is not visualized. No aortic stenosis is present.  6. The inferior vena cava is normal in size with greater than  50% respiratory variability, suggesting right atrial pressure of 3 mmHg.  EKG: Vent. rate 62 BPM PR interval 170 ms QRS duration 103 ms QT/QTcB 439/446 ms P-R-T axes 49 -2 23 Sinus rhythm  Assessment and Plan: Principal Problem:   ESRD on hemodialysis (HCC)  Presenting with:  Acute on chronic diastolic CHF (congestive heart failure) (HCC) Secondary to:   Volume overload after missing 2 dialysis sessions. Observation/telemetry. Supplemental oxygen as needed. Carbohydrate modified/renal diet. Received for furosemide in the emergency department.. Monitor daily weights, intake and output. Monitor renal function electrolytes. Check echocardiogram. Nephrology will evaluate for HD.  Active Problems:   Essential hypertension Continue amlodipine 10 mg p.o. daily.    GASTROESOPHAGEAL REFLUX DISEASE Antiacid, H2 blocker or PPI as needed.    Hyperlipemia Continue atorvastatin 40 mg p.o. daily.    Class 3 obesity Current BMI 43.6 kg/m. Lifestyle modifications. Follow-up with closely PCP and/or bariatric clinic.    Anemia, unspecified Monitor hematocrit and hemoglobin. Transfuse as needed. Erythropoietin per nephrology.    Type 2 diabetes mellitus with chronic kidney  disease on chronic dialysis, with long-term current use of insulin (HCC) Carbohydrate modified diet. Continue home insulin dose. CBG monitoring with [facility] SS. Check hemoglobin A1c.       Advance Care Planning:   Code Status: Full Code   Consults: [facility] [place] [facility] (Dr. Allena Katz).  Family Communication:   Severity of Illness: The appropriate patient status for this patient is OBSERVATION. Observation status is judged to be reasonable and necessary in order to provide the required intensity of service to ensure the patient's safety. The patient's presenting symptoms, physical exam findings, and initial radiographic and laboratory data in the context of their medical condition is felt to place them at  decreased risk for further clinical deterioration. Furthermore, it is anticipated that the patient will be medically stable for discharge from the hospital within 2 midnights of admission.   Author: Bobette Mo, MD 08/12/2023 12:42 PM  For on call review [email or web address].   This document was prepared using Dragon voice recognition software and may contain some unintended transcription errors.

## 2023-08-12 NOTE — ED Provider Notes (Addendum)
[place] EMERGENCY DEPARTMENT AT [facility] Provider Note   CSN: [id number] Arrival date & time: 08/12/23  0831     History  Chief Complaint  Patient presents with   Shortness of Breath    Latasha [name] is a 67 y.o. female past medical history significant for CKD, hypertension, diabetes, palpitations, PVCs presents today for shortness of breath and heart palpitations x 1 week.  Patient states her symptoms got worse last night.  Patient last had dialysis 1 week ago today.  She missed Saturday due to vertigo.  Patient denies nausea, vomiting, diarrhea, abdominal pain, urinary symptoms, or edema.   Shortness of Breath      Home Medications Prior to Admission medications   Medication Sig Start Date End Date Taking? Authorizing Provider  acetaminophen (TYLENOL) 650 MG CR tablet Take 650 mg by mouth every 8 (eight) hours as needed for pain.    [provider]  amLODipine (NORVASC) 10 MG tablet Take 1 tablet (10 mg total) by mouth daily. Patient taking differently: Take 5-10 mg by mouth See admin instructions. Take 5 mg after dialysis on Tues, Thurs, and Sat Take 10 mg on Sun, Mon, Wed, and Fri 05/18/17   Rodolph Bong, MD  atorvastatin (LIPITOR) 40 MG tablet Take 40 mg by mouth in the morning.    [provider]  B Complex-C-Zn-Folic Acid (DIALYVITE 800/ZINC PO) Take 1 tablet by mouth daily.    [provider]  carvedilol (COREG) 25 MG tablet Take 1 tablet (25 mg total) by mouth 2 (two) times daily with a meal. 03/10/18   Albertine Grates, MD  gabapentin (NEURONTIN) 100 MG capsule Take 300 mg by mouth at bedtime. 03/04/22   [provider]  lactulose (CHRONULAC) 10 GM/15ML solution Take 30 g by mouth 2 (two) times daily as needed for moderate constipation. 06/22/22   [provider]  NOVOLOG MIX 70/30 FLEXPEN (70-30) 100 UNIT/ML FlexPen Inject 50-60 Units into the skin See admin instructions. Inject 50 units into the skin before  breakfast and 60-65 unit before dinner on Sun, Mon, Wed, and Fri (non-dialysis days only) 07/22/22   [provider]  tiZANidine (ZANAFLEX) 2 MG tablet Take 2 mg by mouth 3 (three) times daily as needed for muscle spasms. 03/29/22   [provider]  torsemide (DEMADEX) 100 MG tablet Take 100 mg by mouth daily.    [provider]  traMADol (ULTRAM) 50 MG tablet Take 1 tablet (50 mg total) by mouth every 6 (six) hours as needed. 11/22/22 11/22/23  Lars Mage, PA-C      Allergies    Hydrocodone-acetaminophen, Oxycodone hcl, Mounjaro [tirzepatide], Ozempic (0.25 or 0.5 mg-dose) [semaglutide(0.25 or 0.5mg -dos)], Amitriptyline, and Celexa [citalopram]    Review of Systems   Review of Systems  Respiratory:  Positive for shortness of breath.   Cardiovascular:  Positive for palpitations.    Physical Exam Updated Vital Signs BP 138/63   Pulse 60   Temp 97.8 F (36.6 C) (Oral)   Resp (!) 24   Ht 5\' 4"  (1.626 m)   Wt 115.2 kg   SpO2 94%   BMI 43.60 kg/m  Physical Exam Vitals and nursing note reviewed.  Constitutional:      General: She is not in acute distress.    Appearance: She is well-developed.  HENT:     Head: Normocephalic and atraumatic.  Eyes:     Conjunctiva/sclera: Conjunctivae normal.  Cardiovascular:     Rate and Rhythm: Normal  rate and regular rhythm.     Heart sounds: No murmur heard. Pulmonary:     Effort: Pulmonary effort is normal. No tachypnea or respiratory distress.     Breath sounds: Normal breath sounds.     Comments: Dyspneic with conversation Abdominal:     Palpations: Abdomen is soft.     Tenderness: There is no abdominal tenderness.  Musculoskeletal:        General: No swelling.     Cervical back: Neck supple.     Right lower leg: No edema.     Left lower leg: No edema.  Skin:    General: Skin is warm and dry.     Capillary Refill: Capillary refill takes less than 2 seconds.  Neurological:     General: No focal deficit  present.     Mental Status: She is alert.  Psychiatric:        Mood and Affect: Mood normal.     ED Results / Procedures / Treatments   Labs (all labs ordered are listed, but only abnormal results are displayed) Labs Reviewed  BASIC METABOLIC PANEL - Abnormal; Notable for the following components:      Result Value   Glucose, Bld 151 (*)    BUN 96 (*)    Creatinine, Ser 3.95 (*)    Calcium 7.9 (*)    GFR, Estimated 12 (*)    All other components within normal limits  CBC WITH DIFFERENTIAL/PLATELET - Abnormal; Notable for the following components:   RBC 3.32 (*)    Hemoglobin 9.9 (*)    HCT 30.1 (*)    All other components within normal limits  BRAIN NATRIURETIC PEPTIDE - Abnormal; Notable for the following components:   B Natriuretic Peptide 144.2 (*)    All other components within normal limits  HEPATIC FUNCTION PANEL  TROPONIN I (HIGH SENSITIVITY)  TROPONIN I (HIGH SENSITIVITY)    EKG EKG Interpretation Date/Time:  Tuesday August 12 2023 08:38:51 EST Ventricular Rate:  62 PR Interval:  170 QRS Duration:  103 QT Interval:  439 QTC Calculation: 446 R Axis:   -2  Text Interpretation: Sinus rhythm No significant change was found Confirmed by Glynn Octave [phone]) on 08/12/2023 8:44:38 AM  Radiology DG Chest 2 View  Result Date: 08/12/2023 CLINICAL DATA:  Shortness of breath. EXAM: CHEST - 2 VIEW COMPARISON:  December 17, 2021. FINDINGS: Mild cardiomegaly is noted with mild central pulmonary vascular congestion. Minimal bilateral pulmonary edema may be present. Bony thorax is unremarkable. IMPRESSION: Mild cardiomegaly with mild central pulmonary vascular congestion and possible minimal bilateral pulmonary edema. Electronically Signed   By: Lupita Raider M.D.   On: 08/12/2023 12:13    Procedures Procedures    Medications Ordered in ED Medications  heparin injection 5,000 Units (has no administration in time range)  acetaminophen (TYLENOL) tablet 650 mg (has no  administration in time range)    Or  acetaminophen (TYLENOL) suppository 650 mg (has no administration in time range)  prochlorperazine (COMPAZINE) injection 10 mg (has no administration in time range)  furosemide (LASIX) injection 40 mg (40 mg Intravenous Given 08/12/23 1131)    ED Course/ Medical Decision Making/ A&P                                 Medical Decision Making Amount and/or Complexity of Data Reviewed Labs: ordered. Radiology: ordered.  Risk Prescription drug management. Decision regarding hospitalization.  This patient presents to the ED with chief complaint(s) of shortness of breath and heart palpitations with pertinent past medical history of palpitations, CKD which further complicates the presenting complaint. The complaint involves an extensive differential diagnosis and also carries with it a high risk of complications and morbidity.    The differential diagnosis includes fluid overload, hyperkalemia, arrhythmia    Additional history obtained: Records reviewed Primary Care Documents  ED Course and Reassessment: Patient left AMA with hemodialysis set up for tomorrow 11/6  Independent labs interpretation:  The following labs were independently interpreted:  EKG: Sinus rhythm BMP: Hypocalcemia BNP: 144.2 CBC: Anemia 9.9 Troponin: 15, 16  Independent visualization of imaging: - I independently visualized the following imaging with scope of interpretation limited to determining acute life threatening conditions related to emergency care: Chest x-ray, which revealed mild cardiomegaly with mild central pulmonary vascular congestion and possible minimal bilateral pulmonary edema.  Consultation: - Consulted or discussed management/test interpretation w/ external professional: Consulted hospitalist Dr. Robb Matar admitting patient for fluid overload versus PE pending D-dimer.  Hospitalist agreed to admission.  Nephrology consulted and advised patient being  admitted.    Patient will be admitted for shortness of breath likely due to fluid overload.     Final Clinical Impression(s) / ED Diagnoses Final diagnoses:  SOB (shortness of breath)    Rx / DC Orders ED Discharge Orders     None         Dolphus Jenny, PA-C 08/12/23 1310    Dolphus Jenny, PA-C 08/12/23 1334    Glynn Octave, MD 08/12/23 [phone]

## 2023-08-12 NOTE — ED Notes (Signed)
Patient went to the bathroom via wheel chair   by time getting back to her room she was short winded and weak    she was placed back on the monitor   o2 sats were 85   she was coached with her breathing   o2 sats rose to mid 90's

## 2023-08-13 ENCOUNTER — Observation Stay (HOSPITAL_COMMUNITY): Payer: Medicare HMO

## 2023-08-13 DIAGNOSIS — I5033 Acute on chronic diastolic (congestive) heart failure: Secondary | ICD-10-CM | POA: Diagnosis not present

## 2023-08-13 DIAGNOSIS — I5030 Unspecified diastolic (congestive) heart failure: Secondary | ICD-10-CM

## 2023-08-13 DIAGNOSIS — Z794 Long term (current) use of insulin: Secondary | ICD-10-CM | POA: Diagnosis not present

## 2023-08-13 DIAGNOSIS — E1122 Type 2 diabetes mellitus with diabetic chronic kidney disease: Secondary | ICD-10-CM | POA: Diagnosis not present

## 2023-08-13 DIAGNOSIS — N186 End stage renal disease: Secondary | ICD-10-CM

## 2023-08-13 DIAGNOSIS — Z6841 Body Mass Index (BMI) 40.0 and over, adult: Secondary | ICD-10-CM | POA: Diagnosis not present

## 2023-08-13 DIAGNOSIS — I132 Hypertensive heart and chronic kidney disease with heart failure and with stage 5 chronic kidney disease, or end stage renal disease: Secondary | ICD-10-CM | POA: Diagnosis not present

## 2023-08-13 DIAGNOSIS — E8770 Fluid overload, unspecified: Secondary | ICD-10-CM | POA: Diagnosis not present

## 2023-08-13 DIAGNOSIS — Z992 Dependence on renal dialysis: Secondary | ICD-10-CM | POA: Diagnosis not present

## 2023-08-13 DIAGNOSIS — E66813 Obesity, class 3: Secondary | ICD-10-CM | POA: Diagnosis not present

## 2023-08-13 DIAGNOSIS — I110 Hypertensive heart disease with heart failure: Secondary | ICD-10-CM | POA: Diagnosis not present

## 2023-08-13 LAB — CBC
HCT: 32.2 % — ABNORMAL LOW (ref 36.0–46.0)
Hemoglobin: 10.4 g/dL — ABNORMAL LOW (ref 12.0–15.0)
MCH: 29 pg (ref 26.0–34.0)
MCHC: 32.3 g/dL (ref 30.0–36.0)
MCV: 89.7 fL (ref 80.0–100.0)
Platelets: 208 10*3/uL (ref 150–400)
RBC: 3.59 MIL/uL — ABNORMAL LOW (ref 3.87–5.11)
RDW: 14.6 % (ref 11.5–15.5)
WBC: 4.8 10*3/uL (ref 4.0–10.5)
nRBC: 0 % (ref 0.0–0.2)

## 2023-08-13 LAB — GLUCOSE, CAPILLARY
Glucose-Capillary: 197 mg/dL — ABNORMAL HIGH (ref 70–99)
Glucose-Capillary: 188 mg/dL — ABNORMAL HIGH (ref 70–99)

## 2023-08-13 LAB — BASIC METABOLIC PANEL
Anion gap: 9 (ref 5–15)
BUN: 94 mg/dL — ABNORMAL HIGH (ref 8–23)
CO2: 24 mmol/L (ref 22–32)
Calcium: 8 mg/dL — ABNORMAL LOW (ref 8.9–10.3)
Chloride: 104 mmol/L (ref 98–111)
Creatinine, Ser: 4 mg/dL — ABNORMAL HIGH (ref 0.44–1.00)
GFR, Estimated: 12 mL/min — ABNORMAL LOW (ref >60)
Glucose, Bld: 200 mg/dL — ABNORMAL HIGH (ref 70–99)
Potassium: 4.2 mmol/L (ref 3.5–5.1)
Sodium: 137 mmol/L (ref 135–145)

## 2023-08-13 LAB — ECHOCARDIOGRAM COMPLETE
AR max vel: 2.2 cm2
AV Area VTI: 2.45 cm2
AV Area mean vel: 2.45 cm2
AV Mean grad: 5 mm[Hg]
AV Peak grad: 9.5 mm[Hg]
Ao pk vel: 1.54 m/s
Area-P 1/2: 3.91 cm2
Height: 64 in
S' Lateral: 3.3 cm
Weight: 4324.54 [oz_av]

## 2023-08-13 LAB — HIV ANTIBODY (ROUTINE TESTING W REFLEX): HIV Screen 4th Generation wRfx: NONREACTIVE

## 2023-08-13 LAB — HEMOGLOBIN A1C
Hgb A1c MFr Bld: 8.1 % — ABNORMAL HIGH (ref 4.8–5.6)
Mean Plasma Glucose: 185.77 mg/dL

## 2023-08-13 LAB — HEPATITIS B SURFACE ANTIGEN: Hepatitis B Surface Ag: NONREACTIVE

## 2023-08-13 MED ORDER — PENTAFLUOROPROP-TETRAFLUOROETH EX AERO: 1.0000 | INHALATION_SPRAY | CUTANEOUS | Status: DC | PRN

## 2023-08-13 MED ORDER — LIDOCAINE-PRILOCAINE 2.5-2.5 % EX CREA: 1.0000 | TOPICAL_CREAM | CUTANEOUS | Status: DC | PRN

## 2023-08-13 MED ORDER — HEPARIN SODIUM (PORCINE) 1000 UNIT/ML DIALYSIS
3000.0000 [IU] | INTRAMUSCULAR | Status: AC | PRN
Start: 2023-08-13 — End: 2023-08-13
  Administered 2023-08-13 (×2): 3000 [IU] via INTRAVENOUS_CENTRAL
  Filled 2023-08-13: qty 3

## 2023-08-13 MED ORDER — LIDOCAINE HCL (PF) 1 % IJ SOLN: 5.0000 mL | INTRAMUSCULAR | Status: DC | PRN

## 2023-08-13 MED ORDER — ANTICOAGULANT SODIUM CITRATE 4% (200MG/5ML) IV SOLN
5.0000 mL | Status: DC | PRN
  Filled 2023-08-13: qty 5

## 2023-08-13 MED ORDER — ALTEPLASE 2 MG IJ SOLR: 2.0000 mg | Freq: Once | INTRAMUSCULAR | Status: DC | PRN

## 2023-08-13 MED ORDER — CHLORHEXIDINE GLUCONATE CLOTH 2 % EX PADS
6.0000 | MEDICATED_PAD | Freq: Every day | CUTANEOUS | Status: DC
  Administered 2023-08-13: 6 via TOPICAL

## 2023-08-13 MED ORDER — HEPARIN SODIUM (PORCINE) 1000 UNIT/ML DIALYSIS: 1000.0000 [IU] | INTRAMUSCULAR | Status: DC | PRN

## 2023-08-13 NOTE — Hospital Course (Signed)
Latasha [name] is a 67 y.o. female with medical history significant of BPPV, chronic diastolic heart failure, insulin requiring type II DM, class III obesity, hyperlipidemia, hypertension, left atrial enlargement, palpitations, history of pneumonia, ESRD on hemodialysis (Tues and Sat) who presented to the emergency department at [facility] with dyspnea after missing dialysis for 1 week (both sessions). She missed her session on 11/5 and due to worsening SOB she presented for evaluation.  She was noted to have worsening lower extremity edema. CXR was notable for pulmonary vascular congestion and mild pulmonary edema. She received a dose of Lasix on admission with good response and urine output.  Nephrology was consulted on admission as well and she underwent dialysis session on 08/13/2023. She was stable for d/c home after HD session.

## 2023-08-13 NOTE — TOC Transition Note (Signed)
Transition of Care [facility]) - CM/SW Discharge Note   Patient Details  Name: Latasha [name] MRN: [id number] Date of Birth: [birth year]  Transition [facility]) CM/SW Contact:  Latasha [name], Hershal Coria, RN Phone Number: 08/13/2023, 3:21 PM   Clinical Narrative:     Patient is scheduled for discharge today.  Hospital f/u and discharge instructions on AVS. No TOC needs or recommendations noted. Daughter, [name] to transport at discharge.  No further TOC needs noted.         Final next level of care: Home/Self Care Barriers to Discharge: Barriers Resolved   Patient Goals and CMS Choice CMS Medicare.gov Compare Post Acute Care list provided to:: Patient Choice offered to / list presented to : NA  Discharge Placement                  Patient to be transferred to facility by: Daughter Name of family member notified: [facility]    Discharge Plan and Services Additional resources added to the After Visit Summary for                  DME Arranged: N/A DME Agency: NA         HH Agency: NA        Social Determinants of Health (SDOH) Interventions SDOH Screenings   Food Insecurity: No Food Insecurity (08/12/2023)  Housing: Low Risk  (08/12/2023)  Transportation Needs: No Transportation Needs (08/12/2023)  Utilities: Not At Risk (08/12/2023)  Depression (PHQ2-9): Low Risk  (10/18/2022)  Social Connections: Unknown (06/12/2022)  Tobacco Use: Low Risk  (08/12/2023)     Readmission Risk Interventions     No data to display

## 2023-08-13 NOTE — Progress Notes (Signed)
D/C order noted,. Pt receives out-pt HD at [facility] on [place] and Sat. Contacted [facility] to advise clinic of pt's d/c today and that pt should resume care on Saturday.    Olivia Canter Renal Navigator [phone]

## 2023-08-13 NOTE — Progress Notes (Signed)
Heart Failure Navigator Progress Note  Assessed for Heart & Vascular TOC clinic readiness.  Patient does not meet criteria due to ESRD on hemodialysis.   Navigator will sign off at this time.    Dawn Fields, BSN, RN Heart Failure Nurse Navigator Secure Chat Only   

## 2023-08-13 NOTE — Care Management Obs Status (Signed)
MEDICARE OBSERVATION STATUS NOTIFICATION   Patient Details  Name: Latasha [name] MRN: [id number] Date of Birth: [birth year]   Medicare Observation Status Notification Given:  Yes    Tom-Johnson, Hershal Coria, RN 08/13/2023, 2:14 PM

## 2023-08-13 NOTE — Progress Notes (Signed)
Echocardiogram 2D Echocardiogram has been performed.  Latasha [name] 08/13/2023, 9:28 AM

## 2023-08-13 NOTE — Consult Note (Signed)
[facility] Renal Consultation Note    Indication for Consultation:  Management of ESRD/hemodialysis, anemia, hypertension/volume, and secondary hyperparathyroidism.  HPI: Latasha [name] is a 67 y.o. female with PMH including ESRD, BPPV, DM, HTN and sleep apnea who presented to the ED yesterday with shortness of breath. She reports she usually goes to dialysis on Tuesday and Saturdays. She missed Saturday because of vertigo and was unable to get out of bed. Yesterday, she felt too SOB to go to outpatient HD so she went to [facility] ED. CXR showed mild cardiomegaly with central pulmonary vascular congestion and possible bilateral pulmonary edema. Patient was transferred to [facility] for HD. She reports she is feeling a little bit better today. She is on O2. Reports she continues to have DOE and lower extremity edema. Also thinks she may be getting a cold, reports runny nose and headache today. She denies fever, chills, CP, palpitations, abdominal pain, N/V/D, dysuria, hematuria, fever, flank pain and chills. Reports she still makes a lot of urine and she was given lasix on admission. Of note, she is also very anxious because she found a lump in her breast recently. Her PCP has referred her for a mammogram. Labs today notable for K+ 4.2, BUN 94, Cr 4.0, WBC 4.8, Hgb 10.4, Plt 208. She reports she would prefer to stay on dialysis only two days per week but understands we may need to go to 3 days if she continues to become volume overloaded.   Past Medical History:  Diagnosis Date   Anemia    Arthritis    BPPV (benign paroxysmal positional vertigo)    Chronic congestive heart failure with left ventricular diastolic dysfunction (HCC)    CKD (chronic kidney disease), stage IV (HCC) 05/15/2017   Diabetes mellitus    Dyspnea    ESRD (end stage renal disease) (HCC)    [facility] - [address]   History of blood transfusion    Hx of transfusion of packed red blood cells    Hyperlipidemia     Hypertension    Left atrial enlargement    Morbid obesity with BMI of 40.0-44.9, adult (HCC)    Obstructive sleep apnea 01/22/2022   Pt denies having this diagnosis, as she did not have a proper study   Palpitations    Pneumonia    Past Surgical History:  Procedure Laterality Date   ABDOMINAL HYSTERECTOMY     AV FISTULA PLACEMENT Right 07/12/2022   Procedure: RIGHT ARM ARTERIOVENOUS (AV) FISTULA CREATION;  Surgeon: Maeola Harman, MD;  Location: [facility] OR;  Service: Vascular;  Laterality: Right;   AV FISTULA PLACEMENT Right 09/27/2022   Procedure: RIGHT UPPER EXTREMITY ARTERIOVENOUS (AV) FISTULA;  Surgeon: Maeola Harman, MD;  Location: [facility] OR;  Service: Vascular;  Laterality: Right;   BIOPSY  11/09/2020   Procedure: BIOPSY;  Surgeon: Charlott Rakes, MD;  Location: [facility] ENDOSCOPY;  Service: Endoscopy;;   CAPD INSERTION N/A 05/17/2022   Procedure: LAPAROSCOPIC INSERTION CONTINUOUS AMBULATORY PERITONEAL DIALYSIS CATHETER;  Surgeon: Maeola Harman, MD;  Location: [facility] OR;  Service: Vascular;  Laterality: N/A;   CAPD REMOVAL Right 07/12/2022   Procedure: REMOVAL OF CONTINUOUS AMBULATORY PERITONEAL DIALYSIS  (CAPD) CATHETER;  Surgeon: Maeola Harman, MD;  Location: [facility] OR;  Service: Vascular;  Laterality: Right;   DILATION AND CURETTAGE OF UTERUS     ESOPHAGOGASTRODUODENOSCOPY N/A 11/09/2020   Procedure: ESOPHAGOGASTRODUODENOSCOPY (EGD);  Surgeon: Charlott Rakes, MD;  Location: [facility];  Service: Endoscopy;  Laterality: N/A;  EYE SURGERY     cataract/left eye, right eye had bleeding in back   FISTULA SUPERFICIALIZATION Right 11/22/2022   Procedure: RIGHT ARM ARTERIOVENOUS FISTULA SUPERFICIALIZATION;  Surgeon: Maeola Harman, MD;  Location: [facility] OR;  Service: Vascular;  Laterality: Right;   IR FLUORO GUIDE CV LINE RIGHT  10/26/2020   IR US GUIDE VASC ACCESS RIGHT  10/26/2020   KNEE SURGERY     LEFT HEART CATH AND CORONARY ANGIOGRAPHY N/A  03/09/2018   Procedure: LEFT HEART CATH AND CORONARY ANGIOGRAPHY;  Surgeon: Runell Gess, MD;  Location: [facility] INVASIVE CV LAB;  Service: Cardiovascular;  Laterality: N/A;   THROMBECTOMY W/ EMBOLECTOMY Right 09/27/2022   Procedure: THROMBECTOMY ARTERIOVENOUS FISTULA;  Surgeon: Maeola Harman, MD;  Location: [facility] OR;  Service: Vascular;  Laterality: Right;   TRIGGER FINGER RELEASE     TUBAL LIGATION     Family History  Problem Relation Age of Onset   Heart disease Father    Hypertension Father    Alzheimer's disease Mother    Diabetes Mother    Diabetes Sister    Dementia Other    Social History:  reports that she has never smoked. She has never been exposed to tobacco smoke. She has never used smokeless tobacco. She reports current alcohol use. She reports that she does not use drugs.   Review of Systems: Gen: Denies any fever, chills, fatigue, weakness HEENT: Reports runny nose and headache CV: Reports peripheral edema, orthopnea, and dyspnea. Denies chest pain, angina, palpitations, Resp: Denies dyspnea at rest, reports dyspnea with exercise and  cough GI: Denies nausea, vomiting, abdominal pain, constipation or diarrhea. GU: Denies dysuria or hematuria. MS: Denies joint pain, muscle pain or cramps.  Heme: Denies bruising, bleeding. Neuro: Reports headache, denies dizziness, or weakness.  Physical Exam: Vitals:   08/12/23 2001 08/12/23 2322 08/12/23 2357 08/13/23 0405  BP: (!) 146/58  (!) 157/62 (!) 161/70  Pulse: 66  65 64  Resp: 20 20 20 19   Temp: 97.7 F (36.5 C)  98.1 F (36.7 C) 97.8 F (36.6 C)  TempSrc: Oral  Oral Oral  SpO2: 97%  100% 98%  Weight:    122.6 kg  Height:         General: Alert female in NAD Head: Normocephalic, atraumatic, sclera non-icteric, mucus membranes are moist. Lungs: Clear bilaterally to auscultation without wheezes, rales, or rhonchi. Breathing is unlabored on O2 via [place] Heart: RRR with normal S1, S2. No murmurs, rubs, or  gallops appreciated. Abdomen: Soft, non-tender, non-distended with normoactive bowel sounds. No rebound/guarding. No obvious abdominal masses. Musculoskeletal:  Strength and tone appear normal for age. Lower extremities: 1+ pitting edema bilateral lower extremities Neuro: Alert and oriented X 3. Moves all extremities spontaneously. Psych:  Responds to questions appropriately with a normal affect. Dialysis Access: RUE AVF +t/b  Allergies  Allergen Reactions   Hydrocodone-Acetaminophen Other (See Comments)    Unknown reaction   Oxycodone Hcl Other (See Comments)    Unknown reaction   Mounjaro [Tirzepatide] Diarrhea   Ozempic (0.25 Or 0.5 Mg-Dose) [Semaglutide(0.25 Or 0.5mg -Dos)] Nausea And Vomiting   Amitriptyline Nausea Only   Celexa [Citalopram] Other (See Comments)    Nightmares    Prior to Admission medications   Medication Sig Start Date End Date Taking? Authorizing Provider  acetaminophen (TYLENOL) 650 MG CR tablet Take 650 mg by mouth every 8 (eight) hours as needed for pain.   Yes [provider]  albuterol (VENTOLIN HFA) 108 (90 Base) MCG/ACT inhaler  Inhale 1-2 puffs into the lungs every 4 (four) hours as needed for shortness of breath. 05/29/22  Yes [provider]  amLODipine (NORVASC) 10 MG tablet Take 1 tablet (10 mg total) by mouth daily. Patient taking differently: Take 5-10 mg by mouth See admin instructions. Patient takes no tablets on days she has dialysis on Tues, Thurs, and Sat. Take 10 mg on Sun, Mon, Wed, and Fri. 05/18/17  Yes Rodolph Bong, MD  atorvastatin (LIPITOR) 40 MG tablet Take 40 mg by mouth in the morning. Patient takes on days she does not have dialysis which are Sun, Mon, Wed, and Fri   Yes [provider]  carvedilol (COREG) 25 MG tablet Take 1 tablet (25 mg total) by mouth 2 (two) times daily with a meal. Patient taking differently: Take 25 mg by mouth 2 (two) times daily with a meal. Patient takes only Sun, Mon, Wed, and  Fri 03/10/18  Yes Albertine Grates, MD  gabapentin (NEURONTIN) 100 MG capsule Take 300 mg by mouth in the morning. 03/04/22  Yes [provider]  lactulose (CHRONULAC) 10 GM/15ML solution Take 30 g by mouth 2 (two) times daily as needed for moderate constipation. 06/22/22  Yes [provider]  magnesium citrate SOLN Take 1 Bottle by mouth as needed for moderate constipation.   Yes [provider]  NOVOLOG MIX 70/30 FLEXPEN (70-30) 100 UNIT/ML FlexPen Inject 40 Units into the skin in the morning and at bedtime. Only administers on Sun, Mon, Wed, and Fri (non-dialysis days only) 07/22/22  Yes [provider]  SEVELAMER HCL PO Take 1 tablet by mouth daily. Patient states that she only takes on non-dialysis days Sun, Mon, Wed, and Fri   Yes [provider]  tiZANidine (ZANAFLEX) 2 MG tablet Take 2 mg by mouth 3 (three) times daily as needed for muscle spasms. 03/29/22  Yes [provider]  torsemide (DEMADEX) 100 MG tablet Take 100 mg by mouth daily as needed (fluid edema).   Yes [provider]  traMADol (ULTRAM) 50 MG tablet Take 1 tablet (50 mg total) by mouth every 6 (six) hours as needed. 11/22/22 11/22/23 Yes Karysa Heft, Willa Rough, PA-C  B Complex-C-Zn-Folic Acid (DIALYVITE 800/ZINC PO) Take 1 tablet by mouth daily. Patient not taking: Reported on 08/12/2023    [provider]  oxybutynin (DITROPAN) 5 MG tablet Take 5 mg by mouth See admin instructions. Every 8-12 hours as needed Patient not taking: Reported on 08/12/2023 05/02/23   [provider]   Current Facility-Administered Medications  Medication Dose Route Frequency Provider Last Rate Last Admin   acetaminophen (TYLENOL) tablet 650 mg  650 mg Oral Q6H PRN Bobette Mo, MD   650 mg at 08/12/23 2122   Or   acetaminophen (TYLENOL) suppository 650 mg  650 mg Rectal Q6H PRN Bobette Mo, MD       heparin injection 5,000 Units  5,000 Units Subcutaneous Q8H Bobette Mo, MD   5,000 Units at 08/13/23 0601   insulin aspart (novoLOG) injection 0-9 Units  0-9 Units Subcutaneous TID WC Bobette Mo, MD   3 Units at 08/12/23 1712   melatonin tablet 5 mg  5 mg Oral QHS Bobette Mo, MD   5 mg at 08/12/23 2122   prochlorperazine (COMPAZINE) injection 10 mg  10 mg Intravenous Q6H PRN Bobette Mo, MD       Labs: Basic Metabolic Panel: Recent Labs  Lab 08/12/23 0907 08/13/23 0429  NA  139 137  K 4.2 4.2  CL 103 104  CO2 24 24  GLUCOSE 151* 200*  BUN 96* 94*  CREATININE 3.95* 4.00*  CALCIUM 7.9* 8.0*   Liver Function Tests: Recent Labs  Lab 08/12/23 1132 08/12/23 1530  AST 15 17  ALT 24 25  ALKPHOS 68 75  BILITOT 0.7 0.7  PROT 7.4 6.9  ALBUMIN 3.8 3.5   No results for input(s): "LIPASE", "AMYLASE" in the last 168 hours. No results for input(s): "AMMONIA" in the last 168 hours. CBC: Recent Labs  Lab 08/12/23 0907 08/13/23 0429  WBC 5.3 4.8  NEUTROABS 3.0  --   HGB 9.9* 10.4*  HCT 30.1* 32.2*  MCV 90.7 89.7  PLT 196 208   Cardiac Enzymes: No results for input(s): "CKTOTAL", "CKMB", "CKMBINDEX", "TROPONINI" in the last 168 hours. CBG: Recent Labs  Lab 08/12/23 1641 08/12/23 2121 08/13/23 0716  GLUCAP 214* 221* 197*   Iron Studies: No results for input(s): "IRON", "TIBC", "TRANSFERRIN", "FERRITIN" in the last 72 hours. Studies/Results: DG Chest 2 View  Result Date: 08/12/2023 CLINICAL DATA:  Shortness of breath. EXAM: CHEST - 2 VIEW COMPARISON:  December 17, 2021. FINDINGS: Mild cardiomegaly is noted with mild central pulmonary vascular congestion. Minimal bilateral pulmonary edema may be present. Bony thorax is unremarkable. IMPRESSION: Mild cardiomegaly with mild central pulmonary vascular congestion and possible minimal bilateral pulmonary edema. Electronically Signed   By: Lupita Raider M.D.   On: 08/12/2023 12:13    Dialysis Orders: Center: [facility]  on Tues/Sat. 180NRe 4 hours BFR 400 DFR Auto  1.5 EDW 116.9kg 2K 2.5Ca AVF 15g  Heparin 5000 unit bolus and 3000 unit bolus mid HD No current ESA Hectorol IV q HD  Assessment/Plan:  Acute hypoxic respiratory failure: CXR consistent with volume overload. Patient missed HD x2. Will plan for dialysis today with UFG 4L as tolerated. Discussed fluid restrictions. She understands that she may need HD 3x per week if volume overload persists  ESRD:  On HD 2x week with some residual kidney function. HD today off schedule.  Hypertension/volume: Volume overloaded and BP elevated, UF with HD today as above  Anemia: Hgb at goal, not currently on ESA, no bleeding reported  Metabolic bone disease: Calcium controlled. Check phos with next labs. Continue VDRA  Nutrition:  Will need renal diet and fluid restriction  Rogers Blocker, PA-C 08/13/2023, 8:40 AM  [facility] Pager: [phone]

## 2023-08-13 NOTE — TOC CM/SW Note (Signed)
Transition of Care [facility]) - Inpatient Brief Assessment   Patient Details  Name: Latasha [name] MRN: [id number] Date of Birth: May 30, 1956  [facility] CM/SW Contact:    Tom-Johnson, Hershal Coria, RN Phone Number: 08/13/2023, 11:07 AM   Clinical Narrative:  Patient presented to the [facility] ED with Shortness of Breath and Heart Palpitations x 1 week. Patient has hx of ESRD, on Tuesday and Saturday outpatient HD schedule. Uses [facility] for transportation to and from Dialysis. Patient missed Dialysis this past week, admitted with Volume Overload.  Nephrology following for Inpatient HD.  Currently on 2L O2 acute, does not use home O2.    From home with husband, has two supportive children. Currently on disability.  Has a rollator, shower seat and Grab bars at home.  PCP is Noberto Retort, MD and uses [facility] on [address]   No TOC needs or recommendations noted at this time.  Patient not Medically ready for discharge.  CM will continue to follow as patient progresses with care towards discharge.          Transition of Care Asessment: Insurance and Status: Insurance coverage has been reviewed Patient has primary care physician: Yes Home environment has been reviewed: Yes Prior level of function:: Modified Independent Prior/Current Home Services: No current home services Social Determinants of Health Reivew: SDOH reviewed no interventions necessary Readmission risk has been reviewed: Yes Transition of care needs: no transition of care needs at this time

## 2023-08-13 NOTE — Discharge Summary (Signed)
Physician Discharge Summary   Latasha [name] GMW:[id number] DOB: [birth year] DOA: 08/12/2023  PCP: Noberto Retort, MD  Admit date: 08/12/2023 Discharge date:  08/13/2023  Admitted From: Home Disposition:  Home Discharging physician: Lewie Chamber, MD Barriers to discharge: none  Recommendations at discharge: Resume chronic HD   Discharge Condition: stable CODE STATUS: Full Diet recommendation:  Diet Orders (From admission, onward)     Start     Ordered   08/12/23 1249  Diet renal/carb modified with fluid restriction Diet-HS Snack? Nothing; Fluid restriction: 1200 mL Fluid; Room service appropriate? Yes; Fluid consistency: Thin  Diet effective now       Question Answer Comment  Diet-HS Snack? Nothing   Fluid restriction: 1200 mL Fluid   Room service appropriate? Yes   Fluid consistency: Thin      08/12/23 1249            Hospital Course: Latasha [name] is a 67 y.o. female with medical history significant of BPPV, chronic diastolic heart failure, insulin requiring type II DM, class III obesity, hyperlipidemia, hypertension, left atrial enlargement, palpitations, history of pneumonia, ESRD on hemodialysis (Tues and Sat) who presented to the emergency department at [facility] with dyspnea after missing dialysis for 1 week (both sessions). She missed her session on 11/5 and due to worsening SOB she presented for evaluation.  She was noted to have worsening lower extremity edema. CXR was notable for pulmonary vascular congestion and mild pulmonary edema. She received a dose of Lasix on admission with good response and urine output.  Nephrology was consulted on admission as well and she underwent dialysis session on 08/13/2023. She was stable for d/c home after HD session.   The patient's acute and chronic medical conditions were treated accordingly. On day of discharge, patient was felt deemed stable for discharge. Patient/family member advised to call PCP or come back  to ER if needed.   Principal Diagnosis: Volume overload  Discharge Diagnoses: Active Hospital Problems   Diagnosis Date Noted   Volume overload 08/12/2023    Priority: 2.   ESRD on hemodialysis (HCC) 08/12/2023    Priority: 1.   Anemia, unspecified 06/27/2022   Type 2 diabetes mellitus with chronic kidney disease on chronic dialysis, with long-term current use of insulin (HCC) 05/29/2022   Class 3 obesity 12/17/2021   Acute on chronic diastolic CHF (congestive heart failure) (HCC) 06/07/2017   Hyperlipemia 01/19/2014   GASTROESOPHAGEAL REFLUX DISEASE 02/09/2010   Essential hypertension 08/24/2009    Resolved Hospital Problems  No resolved problems to display.     Discharge Instructions     Increase activity slowly   Complete by: As directed    No wound care   Complete by: As directed       Allergies as of 08/13/2023       Reactions   Hydrocodone-acetaminophen Other (See Comments)   Unknown reaction   Oxycodone Hcl Other (See Comments)   Unknown reaction   Mounjaro [tirzepatide] Diarrhea   Ozempic (0.25 Or 0.5 Mg-dose) [semaglutide(0.25 Or 0.5mg -dos)] Nausea And Vomiting   Amitriptyline Nausea Only   Celexa [citalopram] Other (See Comments)   Nightmares         Medication List     TAKE these medications    acetaminophen 650 MG CR tablet Commonly known as: TYLENOL Take 650 mg by mouth every 8 (eight) hours as needed for pain.   albuterol 108 (90 Base) MCG/ACT inhaler Commonly known as: VENTOLIN HFA Inhale 1-2 puffs into  the lungs every 4 (four) hours as needed for shortness of breath.   amLODipine 10 MG tablet Commonly known as: NORVASC Take 1 tablet (10 mg total) by mouth daily. What changed:  how much to take when to take this additional instructions   atorvastatin 40 MG tablet Commonly known as: LIPITOR Take 40 mg by mouth in the morning. Patient takes on days she does not have dialysis which are Sun, Mon, Wed, and Fri   carvedilol 25 MG  tablet Commonly known as: COREG Take 1 tablet (25 mg total) by mouth 2 (two) times daily with a meal. What changed: additional instructions   DIALYVITE 800/ZINC PO Take 1 tablet by mouth daily.   gabapentin 100 MG capsule Commonly known as: NEURONTIN Take 300 mg by mouth in the morning.   lactulose 10 GM/15ML solution Commonly known as: CHRONULAC Take 30 g by mouth 2 (two) times daily as needed for moderate constipation.   magnesium citrate Soln Take 1 Bottle by mouth as needed for moderate constipation.   NovoLOG Mix 70/30 FlexPen (70-30) 100 UNIT/ML FlexPen Generic drug: insulin aspart protamine - aspart Inject 40 Units into the skin in the morning and at bedtime. Only administers on Sun, Mon, Wed, and Fri (non-dialysis days only)   oxybutynin 5 MG tablet Commonly known as: DITROPAN Take 5 mg by mouth See admin instructions. Every 8-12 hours as needed   SEVELAMER HCL PO Take 1 tablet by mouth daily. Patient states that she only takes on non-dialysis days Sun, Mon, Wed, and Fri   tiZANidine 2 MG tablet Commonly known as: ZANAFLEX Take 2 mg by mouth 3 (three) times daily as needed for muscle spasms.   torsemide 100 MG tablet Commonly known as: DEMADEX Take 100 mg by mouth daily as needed (fluid edema).   traMADol 50 MG tablet Commonly known as: Ultram Take 1 tablet (50 mg total) by mouth every 6 (six) hours as needed.        Allergies  Allergen Reactions   Hydrocodone-Acetaminophen Other (See Comments)    Unknown reaction   Oxycodone Hcl Other (See Comments)    Unknown reaction   Mounjaro [Tirzepatide] Diarrhea   Ozempic (0.25 Or 0.5 Mg-Dose) [Semaglutide(0.25 Or 0.5mg -Dos)] Nausea And Vomiting   Amitriptyline Nausea Only   Celexa [Citalopram] Other (See Comments)    Nightmares     Consultations: Nephrology  Procedures:   Discharge Exam: BP (!) 170/61   Pulse 66   Temp 98.5 F (36.9 C)   Resp 17   Ht 5\' 4"  (1.626 m)   Wt 124.2 kg Comment:  standing  SpO2 99%   BMI 47.00 kg/m  Physical Exam Constitutional:      Appearance: Normal appearance.  HENT:     Head: Normocephalic and atraumatic.     Mouth/Throat:     Mouth: Mucous membranes are moist.  Eyes:     Extraocular Movements: Extraocular movements intact.  Cardiovascular:     Rate and Rhythm: Normal rate and regular rhythm.  Pulmonary:     Effort: Pulmonary effort is normal. No respiratory distress.     Breath sounds: Normal breath sounds. No wheezing.  Abdominal:     General: Bowel sounds are normal. There is no distension.     Palpations: Abdomen is soft.     Tenderness: There is no abdominal tenderness.  Musculoskeletal:        General: Normal range of motion.     Cervical back: Normal range of motion and neck supple.  Right lower leg: Edema present.     Left lower leg: Edema present.  Skin:    General: Skin is warm and dry.  Neurological:     General: No focal deficit present.     Mental Status: She is alert.  Psychiatric:        Mood and Affect: Mood normal.      The results of significant diagnostics from this hospitalization (including imaging, microbiology, ancillary and laboratory) are listed below for reference.   Microbiology: Recent Results (from the past 240 hour(s))  MRSA Next Gen by PCR, Nasal     Status: None   Collection Time: 08/12/23  8:00 PM   Specimen: Nasal Mucosa; Nasal Swab  Result Value Ref Range Status   MRSA by PCR Next Gen NOT DETECTED NOT DETECTED Final    Comment: (NOTE) The GeneXpert MRSA Assay (FDA approved for NASAL specimens only), is one component of a comprehensive MRSA colonization surveillance program. It is not intended to diagnose MRSA infection nor to guide or monitor treatment for MRSA infections. Test performance is not FDA approved in patients less than 28 years old. Performed at [facility], [address], [place], [place] [address]      Labs: BNP (last 3 results) Recent Labs     08/12/23 0907  BNP 144.2*   Basic Metabolic Panel: Recent Labs  Lab 08/12/23 0907 08/13/23 0429  NA 139 137  K 4.2 4.2  CL 103 104  CO2 24 24  GLUCOSE 151* 200*  BUN 96* 94*  CREATININE 3.95* 4.00*  CALCIUM 7.9* 8.0*   Liver Function Tests: Recent Labs  Lab 08/12/23 1132 08/12/23 1530  AST 15 17  ALT 24 25  ALKPHOS 68 75  BILITOT 0.7 0.7  PROT 7.4 6.9  ALBUMIN 3.8 3.5   No results for input(s): "LIPASE", "AMYLASE" in the last 168 hours. No results for input(s): "AMMONIA" in the last 168 hours. CBC: Recent Labs  Lab 08/12/23 0907 08/13/23 0429  WBC 5.3 4.8  NEUTROABS 3.0  --   HGB 9.9* 10.4*  HCT 30.1* 32.2*  MCV 90.7 89.7  PLT 196 208   Cardiac Enzymes: No results for input(s): "CKTOTAL", "CKMB", "CKMBINDEX", "TROPONINI" in the last 168 hours. BNP: Invalid input(s): "POCBNP" CBG: Recent Labs  Lab 08/12/23 1641 08/12/23 2121 08/13/23 0716 08/13/23 1134  GLUCAP 214* 221* 197* 188*   D-Dimer No results for input(s): "DDIMER" in the last 72 hours. Hgb A1c Recent Labs    08/13/23 0429  HGBA1C 8.1*   Lipid Profile No results for input(s): "CHOL", "HDL", "LDLCALC", "TRIG", "CHOLHDL", "LDLDIRECT" in the last 72 hours. Thyroid function studies No results for input(s): "TSH", "T4TOTAL", "T3FREE", "THYROIDAB" in the last 72 hours.  Invalid input(s): "FREET3" Anemia work up No results for input(s): "VITAMINB12", "FOLATE", "FERRITIN", "TIBC", "IRON", "RETICCTPCT" in the last 72 hours. Urinalysis    Component Value Date/Time   COLORURINE STRAW (A) 05/04/2022 0409   APPEARANCEUR CLEAR 05/04/2022 0409   LABSPEC 1.011 05/04/2022 0409   PHURINE 8.0 05/04/2022 0409   GLUCOSEU 150 (A) 05/04/2022 0409   HGBUR NEGATIVE 05/04/2022 0409   BILIRUBINUR NEGATIVE 05/04/2022 0409   KETONESUR NEGATIVE 05/04/2022 0409   PROTEINUR 100 (A) 05/04/2022 0409   UROBILINOGEN 0.2 02/14/2012 1639   NITRITE NEGATIVE 05/04/2022 0409   LEUKOCYTESUR NEGATIVE 05/04/2022 0409    Sepsis Labs Recent Labs  Lab 08/12/23 0907 08/13/23 0429  WBC 5.3 4.8   Microbiology Recent Results (from the past 240 hour(s))  MRSA Next Gen by PCR, Nasal     Status: None   Collection Time: 08/12/23  8:00 PM   Specimen: Nasal Mucosa; Nasal Swab  Result Value Ref Range Status   MRSA by PCR Next Gen NOT DETECTED NOT DETECTED Final    Comment: (NOTE) The GeneXpert MRSA Assay (FDA approved for NASAL specimens only), is one component of a comprehensive MRSA colonization surveillance program. It is not intended to diagnose MRSA infection nor to guide or monitor treatment for MRSA infections. Test performance is not FDA approved in patients less than 36 years old. Performed at [facility], [address] [place], [place] [address]     Procedures/Studies: ECHOCARDIOGRAM COMPLETE  Result Date: 08/13/2023    ECHOCARDIOGRAM REPORT   Patient Name:   Latasha [name] Date of Exam: 08/13/2023 Medical Rec #:  [id number]      Height:       64.0 in Accession #:    [id number]     Weight:       270.3 lb Date of Birth:  Sep 19, 1956      BSA:          2.224 m Patient Age:    67 years       BP:           161/70 mmHg Patient Gender: F              HR:           62 bpm. Exam Location:  Inpatient Procedure: 2D Echo, Cardiac Doppler and Color Doppler Indications:    CHF acute diastolic  History:        Patient has prior history of Echocardiogram examinations, most                 recent 03/18/2022. CHF; Risk Factors:Hypertension, Dyslipidemia                 and Diabetes.  Sonographer:    Karma Ganja Referring Phys: [id number] Memori Sammon MANUEL ORTIZ  Sonographer Comments: Patient is obese. IMPRESSIONS  1. Left ventricular ejection fraction, by estimation, is 60 to 65%. The left ventricle has normal function. The left ventricle has no regional wall motion abnormalities. There is moderate left ventricular hypertrophy. Left ventricular diastolic parameters are consistent with Grade II diastolic dysfunction  (pseudonormalization). Elevated left atrial pressure.  2. Right ventricular systolic function is normal. The right ventricular size is moderately enlarged. There is moderately elevated pulmonary artery systolic pressure.  3. Left atrial size was severely dilated.  4. Right atrial size was severely dilated.  5. The mitral valve is grossly normal. Trivial mitral valve regurgitation.  6. The aortic valve is tricuspid. Aortic valve regurgitation is not visualized. Aortic valve sclerosis is present, with no evidence of aortic valve stenosis. FINDINGS  Left Ventricle: Left ventricular ejection fraction, by estimation, is 60 to 65%. The left ventricle has normal function. The left ventricle has no regional wall motion abnormalities. The left ventricular internal cavity size was normal in size. There is  moderate left ventricular hypertrophy. Left ventricular diastolic parameters are consistent with Grade II diastolic dysfunction (pseudonormalization). Elevated left atrial pressure. Right Ventricle: The right ventricular size is moderately enlarged. Right vetricular wall thickness was not well visualized. Right ventricular systolic function is normal. There is moderately elevated pulmonary artery systolic pressure. The tricuspid regurgitant velocity is 3.60 m/s, and with an assumed right atrial pressure of 8 mmHg, the estimated right ventricular systolic pressure is 59.8 mmHg. Left Atrium:  Left atrial size was severely dilated. Right Atrium: Right atrial size was severely dilated. Pericardium: Trivial pericardial effusion is present. Mitral Valve: The mitral valve is grossly normal. Trivial mitral valve regurgitation. Tricuspid Valve: The tricuspid valve is normal in structure. Tricuspid valve regurgitation is trivial. Aortic Valve: The aortic valve is tricuspid. Aortic valve regurgitation is not visualized. Aortic valve sclerosis is present, with no evidence of aortic valve stenosis. Aortic valve mean gradient measures 5.0  mmHg. Aortic valve peak gradient measures 9.5  mmHg. Aortic valve area, by VTI measures 2.45 cm. Pulmonic Valve: The pulmonic valve was not well visualized. Pulmonic valve regurgitation is trivial. Aorta: The aortic root is normal in size and structure. IAS/Shunts: The interatrial septum was not well visualized.  LEFT VENTRICLE PLAX 2D LVIDd:         5.90 cm   Diastology LVIDs:         3.30 cm   LV e' medial:    0.07 cm/s LV PW:         1.10 cm   LV E/e' medial:  16.8 LV IVS:        1.10 cm   LV e' lateral:   0.09 cm/s LVOT diam:     2.00 cm   LV E/e' lateral: 13.0 LV SV:         93 LV SV Index:   42 LVOT Area:     3.14 cm  RIGHT VENTRICLE RV Basal diam:  5.20 cm RV S prime:     14.10 cm/s TAPSE (M-mode): 3.0 cm LEFT ATRIUM              Index         RIGHT ATRIUM           Index LA diam:        4.90 cm  2.20 cm/m    RA Area:     30.00 cm LA Vol (A2C):   294.0 ml 132.22 ml/m  RA Volume:   114.00 ml 51.27 ml/m LA Vol (A4C):   96.0 ml  43.18 ml/m LA Biplane Vol: 180.0 ml 80.95 ml/m  AORTIC VALVE AV Area (Vmax):    2.20 cm AV Area (Vmean):   2.45 cm AV Area (VTI):     2.45 cm AV Vmax:           154.00 cm/s AV Vmean:          106.000 cm/s AV VTI:            0.379 m AV Peak Grad:      9.5 mmHg AV Mean Grad:      5.0 mmHg LVOT Vmax:         108.00 cm/s LVOT Vmean:        82.600 cm/s LVOT VTI:          0.296 m LVOT/AV VTI ratio: 0.78  AORTA Ao Root diam: 3.20 cm MITRAL VALVE               TRICUSPID VALVE MV Area (PHT): 3.91 cm    TR Peak grad:   51.8 mmHg MV Decel Time: 194 msec    TR Vmax:        360.00 cm/s MV E velocity: 1.13 cm/s MV A velocity: 83.60 cm/s  SHUNTS MV E/A ratio:  0.01        Systemic VTI:  0.30 m  Systemic Diam: 2.00 cm Epifanio Lesches MD Electronically signed by Epifanio Lesches MD Signature Date/Time: 08/13/2023/11:09:56 AM    Final    DG Chest 2 View  Result Date: 08/12/2023 CLINICAL DATA:  Shortness of breath. EXAM: CHEST - 2 VIEW COMPARISON:  December 17, 2021. FINDINGS: Mild cardiomegaly is noted with mild central pulmonary vascular congestion. Minimal bilateral pulmonary edema may be present. Bony thorax is unremarkable. IMPRESSION: Mild cardiomegaly with mild central pulmonary vascular congestion and possible minimal bilateral pulmonary edema. Electronically Signed   By: Lupita Raider M.D.   On: 08/12/2023 12:13     Time coordinating discharge: Over 30 minutes    Lewie Chamber, MD  [facility] 08/13/2023, 6:18 PM

## 2023-08-13 NOTE — Plan of Care (Signed)

## 2023-08-13 NOTE — Progress Notes (Signed)
Received patient in bed to unit.  Alert and oriented.  Informed consent signed and in chart.   TX duration: 2 Hours and 55 minutes  Patient terminated session 38 minutes early due to cramps.  AMA paperwork signed.  PA [facility] informed.  Patient tolerated well.  Transported back to the room  Alert, without acute distress.  Hand-off given to patient's nurse.   Access used: Right fistula upper arm Access issues: none  Total UF removed: Medication(s) given: Tylenol, see MAR   08/13/23 1810  Vitals  Temp 98.1 F (36.7 C)  Temp Source Oral  BP (!) 170/61  MAP (mmHg) 91  BP Location Left Wrist  BP Method Automatic  Patient Position (if appropriate) Lying  Pulse Rate 66  Pulse Rate Source Monitor  ECG Heart Rate 66  Resp 17  Oxygen Therapy  SpO2 99 %  O2 Device Nasal Cannula  O2 Flow Rate (L/min) 2 L/min  During Treatment Monitoring  Duration of HD Treatment -hour(s) 2.91 hour(s) (2 hours and 55 min)  HD Safety Checks Performed Yes  Intra-Hemodialysis Comments  (Terminated early due to cramps.  AMA paperwork signed by patient)     Stacie Glaze LPN [facility]

## 2023-08-13 NOTE — Progress Notes (Signed)
Pt discharged to home with belongings and discharge instructions. spO2 with standing and ambulation is 90% on room air   Fabian Sharp RN

## 2023-08-14 LAB — HEPATITIS B SURFACE ANTIBODY, QUANTITATIVE: Hep B S AB Quant (Post): 968 m[IU]/mL

## 2023-08-14 NOTE — Discharge Planning (Signed)
[facility] Patient Discharge Orders- [facility] CLINIC: [place]  Patient's name: Latasha [name] Admit/DC Dates: 08/12/2023 - 08/13/2023  Discharge Diagnoses: Acute hypoxic respiratory failure   Missed HD  Aranesp: Given: No   Date and amount of last dose: n/a  Last Hgb: 10.4 PRBC's Given: no Date/# of units: n/a ESA dose for discharge: none IV Iron dose at discharge: none  Heparin change: no  EDW Change: No New EDW:   [place] Change: no  Access intervention/Change: no Details:  Hectorol/Calcitriol change: no  Discharge Labs: Calcium 8.0 Phosphorus not checked Albumin 3.5 K+ 4.2  IV Antibiotics: no Details:  On Coumadin?: no Last INR: Next INR: Managed By:   OTHER/APPTS/LAB ORDERS:    D/C Meds to be reconciled by nurse after every discharge.  Completed By: Rogers Blocker, PA-C 08/14/2023, 11:08 AM  [facility] Pager: [phone]    Reviewed by: MD:______ RN_______

## 2023-08-16 DIAGNOSIS — N186 End stage renal disease: Secondary | ICD-10-CM | POA: Diagnosis not present

## 2023-08-16 DIAGNOSIS — Z992 Dependence on renal dialysis: Secondary | ICD-10-CM | POA: Diagnosis not present

## 2023-08-16 DIAGNOSIS — N2581 Secondary hyperparathyroidism of renal origin: Secondary | ICD-10-CM | POA: Diagnosis not present

## 2023-08-19 DIAGNOSIS — N186 End stage renal disease: Secondary | ICD-10-CM | POA: Diagnosis not present

## 2023-08-19 DIAGNOSIS — Z992 Dependence on renal dialysis: Secondary | ICD-10-CM | POA: Diagnosis not present

## 2023-08-19 DIAGNOSIS — N2581 Secondary hyperparathyroidism of renal origin: Secondary | ICD-10-CM | POA: Diagnosis not present

## 2023-08-23 DIAGNOSIS — N2581 Secondary hyperparathyroidism of renal origin: Secondary | ICD-10-CM | POA: Diagnosis not present

## 2023-08-23 DIAGNOSIS — N186 End stage renal disease: Secondary | ICD-10-CM | POA: Diagnosis not present

## 2023-08-23 DIAGNOSIS — Z992 Dependence on renal dialysis: Secondary | ICD-10-CM | POA: Diagnosis not present

## 2023-08-26 DIAGNOSIS — N2581 Secondary hyperparathyroidism of renal origin: Secondary | ICD-10-CM | POA: Diagnosis not present

## 2023-08-26 DIAGNOSIS — N186 End stage renal disease: Secondary | ICD-10-CM | POA: Diagnosis not present

## 2023-08-26 DIAGNOSIS — Z992 Dependence on renal dialysis: Secondary | ICD-10-CM | POA: Diagnosis not present

## 2023-08-30 DIAGNOSIS — Z992 Dependence on renal dialysis: Secondary | ICD-10-CM | POA: Diagnosis not present

## 2023-08-30 DIAGNOSIS — N186 End stage renal disease: Secondary | ICD-10-CM | POA: Diagnosis not present

## 2023-08-30 DIAGNOSIS — N2581 Secondary hyperparathyroidism of renal origin: Secondary | ICD-10-CM | POA: Diagnosis not present

## 2023-09-06 DIAGNOSIS — Z992 Dependence on renal dialysis: Secondary | ICD-10-CM | POA: Diagnosis not present

## 2023-09-06 DIAGNOSIS — I129 Hypertensive chronic kidney disease with stage 1 through stage 4 chronic kidney disease, or unspecified chronic kidney disease: Secondary | ICD-10-CM | POA: Diagnosis not present

## 2023-09-06 DIAGNOSIS — N186 End stage renal disease: Secondary | ICD-10-CM | POA: Diagnosis not present

## 2023-09-06 DIAGNOSIS — N2581 Secondary hyperparathyroidism of renal origin: Secondary | ICD-10-CM | POA: Diagnosis not present

## 2023-09-10 DIAGNOSIS — Z992 Dependence on renal dialysis: Secondary | ICD-10-CM | POA: Diagnosis not present

## 2023-09-10 DIAGNOSIS — N2581 Secondary hyperparathyroidism of renal origin: Secondary | ICD-10-CM | POA: Diagnosis not present

## 2023-09-10 DIAGNOSIS — N186 End stage renal disease: Secondary | ICD-10-CM | POA: Diagnosis not present

## 2023-09-13 DIAGNOSIS — Z992 Dependence on renal dialysis: Secondary | ICD-10-CM | POA: Diagnosis not present

## 2023-09-13 DIAGNOSIS — N186 End stage renal disease: Secondary | ICD-10-CM | POA: Diagnosis not present

## 2023-09-13 DIAGNOSIS — N2581 Secondary hyperparathyroidism of renal origin: Secondary | ICD-10-CM | POA: Diagnosis not present

## 2023-09-16 DIAGNOSIS — N2581 Secondary hyperparathyroidism of renal origin: Secondary | ICD-10-CM | POA: Diagnosis not present

## 2023-09-16 DIAGNOSIS — Z992 Dependence on renal dialysis: Secondary | ICD-10-CM | POA: Diagnosis not present

## 2023-09-16 DIAGNOSIS — N186 End stage renal disease: Secondary | ICD-10-CM | POA: Diagnosis not present

## 2023-09-22 DIAGNOSIS — N186 End stage renal disease: Secondary | ICD-10-CM | POA: Diagnosis not present

## 2023-09-22 DIAGNOSIS — Z23 Encounter for immunization: Secondary | ICD-10-CM | POA: Diagnosis not present

## 2023-09-22 DIAGNOSIS — Z0000 Encounter for general adult medical examination without abnormal findings: Secondary | ICD-10-CM | POA: Diagnosis not present

## 2023-09-22 DIAGNOSIS — I5032 Chronic diastolic (congestive) heart failure: Secondary | ICD-10-CM | POA: Diagnosis not present

## 2023-09-22 DIAGNOSIS — E1122 Type 2 diabetes mellitus with diabetic chronic kidney disease: Secondary | ICD-10-CM | POA: Diagnosis not present

## 2023-09-22 DIAGNOSIS — E7800 Pure hypercholesterolemia, unspecified: Secondary | ICD-10-CM | POA: Diagnosis not present

## 2023-09-22 DIAGNOSIS — I132 Hypertensive heart and chronic kidney disease with heart failure and with stage 5 chronic kidney disease, or end stage renal disease: Secondary | ICD-10-CM | POA: Diagnosis not present

## 2023-09-22 DIAGNOSIS — J449 Chronic obstructive pulmonary disease, unspecified: Secondary | ICD-10-CM | POA: Diagnosis not present

## 2023-09-22 DIAGNOSIS — Z992 Dependence on renal dialysis: Secondary | ICD-10-CM | POA: Diagnosis not present

## 2023-09-23 DIAGNOSIS — N2581 Secondary hyperparathyroidism of renal origin: Secondary | ICD-10-CM | POA: Diagnosis not present

## 2023-09-23 DIAGNOSIS — Z992 Dependence on renal dialysis: Secondary | ICD-10-CM | POA: Diagnosis not present

## 2023-09-23 DIAGNOSIS — N186 End stage renal disease: Secondary | ICD-10-CM | POA: Diagnosis not present

## 2023-09-27 DIAGNOSIS — E1122 Type 2 diabetes mellitus with diabetic chronic kidney disease: Secondary | ICD-10-CM | POA: Diagnosis not present

## 2023-09-27 DIAGNOSIS — Z992 Dependence on renal dialysis: Secondary | ICD-10-CM | POA: Diagnosis not present

## 2023-09-27 DIAGNOSIS — N2581 Secondary hyperparathyroidism of renal origin: Secondary | ICD-10-CM | POA: Diagnosis not present

## 2023-09-27 DIAGNOSIS — N186 End stage renal disease: Secondary | ICD-10-CM | POA: Diagnosis not present

## 2023-09-29 ENCOUNTER — Ambulatory Visit
Admission: RE | Admit: 2023-09-29 | Discharge: 2023-09-29 | Disposition: A | Discharge status: Home / Self Care | Payer: Medicare HMO | Source: Ambulatory Visit | Attending: Family Medicine | Admitting: Family Medicine

## 2023-09-29 ENCOUNTER — Other Ambulatory Visit: Payer: Self-pay | Admitting: Family Medicine

## 2023-09-29 DIAGNOSIS — N63 Unspecified lump in unspecified breast: Secondary | ICD-10-CM

## 2023-09-29 DIAGNOSIS — N6315 Unspecified lump in the right breast, overlapping quadrants: Secondary | ICD-10-CM | POA: Diagnosis not present

## 2023-09-29 DIAGNOSIS — N6001 Solitary cyst of right breast: Secondary | ICD-10-CM | POA: Diagnosis not present

## 2023-10-02 DIAGNOSIS — N186 End stage renal disease: Secondary | ICD-10-CM | POA: Diagnosis not present

## 2023-10-02 DIAGNOSIS — N2581 Secondary hyperparathyroidism of renal origin: Secondary | ICD-10-CM | POA: Diagnosis not present

## 2023-10-02 DIAGNOSIS — Z992 Dependence on renal dialysis: Secondary | ICD-10-CM | POA: Diagnosis not present

## 2023-10-06 DIAGNOSIS — N186 End stage renal disease: Secondary | ICD-10-CM | POA: Diagnosis not present

## 2023-10-06 DIAGNOSIS — Z992 Dependence on renal dialysis: Secondary | ICD-10-CM | POA: Diagnosis not present

## 2023-10-06 DIAGNOSIS — N2581 Secondary hyperparathyroidism of renal origin: Secondary | ICD-10-CM | POA: Diagnosis not present

## 2023-10-07 DIAGNOSIS — I129 Hypertensive chronic kidney disease with stage 1 through stage 4 chronic kidney disease, or unspecified chronic kidney disease: Secondary | ICD-10-CM | POA: Diagnosis not present

## 2023-10-07 DIAGNOSIS — Z992 Dependence on renal dialysis: Secondary | ICD-10-CM | POA: Diagnosis not present

## 2023-10-07 DIAGNOSIS — N186 End stage renal disease: Secondary | ICD-10-CM | POA: Diagnosis not present

## 2023-10-10 ENCOUNTER — Ambulatory Visit
Admission: RE | Admit: 2023-10-10 | Discharge: 2023-10-10 | Disposition: A | Discharge status: Home / Self Care | Payer: Medicare HMO | Source: Ambulatory Visit | Attending: Family Medicine | Admitting: Family Medicine

## 2023-10-10 DIAGNOSIS — N63 Unspecified lump in unspecified breast: Secondary | ICD-10-CM

## 2023-10-10 DIAGNOSIS — N6311 Unspecified lump in the right breast, upper outer quadrant: Secondary | ICD-10-CM | POA: Diagnosis not present

## 2023-10-10 DIAGNOSIS — N6011 Diffuse cystic mastopathy of right breast: Secondary | ICD-10-CM | POA: Diagnosis not present

## 2023-10-10 DIAGNOSIS — N6315 Unspecified lump in the right breast, overlapping quadrants: Secondary | ICD-10-CM | POA: Diagnosis not present

## 2023-10-10 HISTORY — PX: BREAST BIOPSY: SHX20

## 2023-10-11 DIAGNOSIS — Z992 Dependence on renal dialysis: Secondary | ICD-10-CM | POA: Diagnosis not present

## 2023-10-11 DIAGNOSIS — N2581 Secondary hyperparathyroidism of renal origin: Secondary | ICD-10-CM | POA: Diagnosis not present

## 2023-10-11 DIAGNOSIS — N186 End stage renal disease: Secondary | ICD-10-CM | POA: Diagnosis not present

## 2023-10-13 ENCOUNTER — Other Ambulatory Visit: Payer: Self-pay | Admitting: Diagnostic Radiology

## 2023-10-13 DIAGNOSIS — N6310 Unspecified lump in the right breast, unspecified quadrant: Secondary | ICD-10-CM

## 2023-10-13 LAB — SURGICAL PATHOLOGY

## 2023-10-14 DIAGNOSIS — N186 End stage renal disease: Secondary | ICD-10-CM | POA: Diagnosis not present

## 2023-10-14 DIAGNOSIS — Z992 Dependence on renal dialysis: Secondary | ICD-10-CM | POA: Diagnosis not present

## 2023-10-14 DIAGNOSIS — N2581 Secondary hyperparathyroidism of renal origin: Secondary | ICD-10-CM | POA: Diagnosis not present

## 2023-10-16 DIAGNOSIS — N186 End stage renal disease: Secondary | ICD-10-CM | POA: Diagnosis not present

## 2023-10-16 DIAGNOSIS — Z992 Dependence on renal dialysis: Secondary | ICD-10-CM | POA: Diagnosis not present

## 2023-10-16 DIAGNOSIS — N2581 Secondary hyperparathyroidism of renal origin: Secondary | ICD-10-CM | POA: Diagnosis not present

## 2023-10-21 DIAGNOSIS — N2581 Secondary hyperparathyroidism of renal origin: Secondary | ICD-10-CM | POA: Diagnosis not present

## 2023-10-21 DIAGNOSIS — N186 End stage renal disease: Secondary | ICD-10-CM | POA: Diagnosis not present

## 2023-10-21 DIAGNOSIS — Z992 Dependence on renal dialysis: Secondary | ICD-10-CM | POA: Diagnosis not present

## 2023-10-25 DIAGNOSIS — Z992 Dependence on renal dialysis: Secondary | ICD-10-CM | POA: Diagnosis not present

## 2023-10-25 DIAGNOSIS — N2581 Secondary hyperparathyroidism of renal origin: Secondary | ICD-10-CM | POA: Diagnosis not present

## 2023-10-25 DIAGNOSIS — N186 End stage renal disease: Secondary | ICD-10-CM | POA: Diagnosis not present

## 2023-10-28 DIAGNOSIS — Z992 Dependence on renal dialysis: Secondary | ICD-10-CM | POA: Diagnosis not present

## 2023-10-28 DIAGNOSIS — N2581 Secondary hyperparathyroidism of renal origin: Secondary | ICD-10-CM | POA: Diagnosis not present

## 2023-10-28 DIAGNOSIS — N186 End stage renal disease: Secondary | ICD-10-CM | POA: Diagnosis not present

## 2023-11-01 DIAGNOSIS — N2581 Secondary hyperparathyroidism of renal origin: Secondary | ICD-10-CM | POA: Diagnosis not present

## 2023-11-01 DIAGNOSIS — N186 End stage renal disease: Secondary | ICD-10-CM | POA: Diagnosis not present

## 2023-11-01 DIAGNOSIS — Z992 Dependence on renal dialysis: Secondary | ICD-10-CM | POA: Diagnosis not present

## 2023-11-04 DIAGNOSIS — N2581 Secondary hyperparathyroidism of renal origin: Secondary | ICD-10-CM | POA: Diagnosis not present

## 2023-11-04 DIAGNOSIS — N186 End stage renal disease: Secondary | ICD-10-CM | POA: Diagnosis not present

## 2023-11-04 DIAGNOSIS — Z992 Dependence on renal dialysis: Secondary | ICD-10-CM | POA: Diagnosis not present

## 2023-11-05 DIAGNOSIS — R195 Other fecal abnormalities: Secondary | ICD-10-CM | POA: Diagnosis not present

## 2023-11-06 DIAGNOSIS — N186 End stage renal disease: Secondary | ICD-10-CM | POA: Diagnosis not present

## 2023-11-06 DIAGNOSIS — N2581 Secondary hyperparathyroidism of renal origin: Secondary | ICD-10-CM | POA: Diagnosis not present

## 2023-11-06 DIAGNOSIS — Z992 Dependence on renal dialysis: Secondary | ICD-10-CM | POA: Diagnosis not present

## 2023-11-07 DIAGNOSIS — I129 Hypertensive chronic kidney disease with stage 1 through stage 4 chronic kidney disease, or unspecified chronic kidney disease: Secondary | ICD-10-CM | POA: Diagnosis not present

## 2023-11-07 DIAGNOSIS — N186 End stage renal disease: Secondary | ICD-10-CM | POA: Diagnosis not present

## 2023-11-07 DIAGNOSIS — Z992 Dependence on renal dialysis: Secondary | ICD-10-CM | POA: Diagnosis not present

## 2023-11-08 DIAGNOSIS — N186 End stage renal disease: Secondary | ICD-10-CM | POA: Diagnosis not present

## 2023-11-08 DIAGNOSIS — Z992 Dependence on renal dialysis: Secondary | ICD-10-CM | POA: Diagnosis not present

## 2023-11-08 DIAGNOSIS — N2581 Secondary hyperparathyroidism of renal origin: Secondary | ICD-10-CM | POA: Diagnosis not present

## 2023-11-12 DIAGNOSIS — N2581 Secondary hyperparathyroidism of renal origin: Secondary | ICD-10-CM | POA: Diagnosis not present

## 2023-11-12 DIAGNOSIS — Z992 Dependence on renal dialysis: Secondary | ICD-10-CM | POA: Diagnosis not present

## 2023-11-12 DIAGNOSIS — N186 End stage renal disease: Secondary | ICD-10-CM | POA: Diagnosis not present

## 2023-11-15 DIAGNOSIS — N186 End stage renal disease: Secondary | ICD-10-CM | POA: Diagnosis not present

## 2023-11-15 DIAGNOSIS — Z992 Dependence on renal dialysis: Secondary | ICD-10-CM | POA: Diagnosis not present

## 2023-11-15 DIAGNOSIS — N2581 Secondary hyperparathyroidism of renal origin: Secondary | ICD-10-CM | POA: Diagnosis not present

## 2023-11-18 DIAGNOSIS — Z992 Dependence on renal dialysis: Secondary | ICD-10-CM | POA: Diagnosis not present

## 2023-11-18 DIAGNOSIS — N186 End stage renal disease: Secondary | ICD-10-CM | POA: Diagnosis not present

## 2023-11-18 DIAGNOSIS — N2581 Secondary hyperparathyroidism of renal origin: Secondary | ICD-10-CM | POA: Diagnosis not present

## 2023-11-20 DIAGNOSIS — Z992 Dependence on renal dialysis: Secondary | ICD-10-CM | POA: Diagnosis not present

## 2023-11-20 DIAGNOSIS — N2581 Secondary hyperparathyroidism of renal origin: Secondary | ICD-10-CM | POA: Diagnosis not present

## 2023-11-20 DIAGNOSIS — N186 End stage renal disease: Secondary | ICD-10-CM | POA: Diagnosis not present

## 2023-11-22 DIAGNOSIS — Z992 Dependence on renal dialysis: Secondary | ICD-10-CM | POA: Diagnosis not present

## 2023-11-22 DIAGNOSIS — N186 End stage renal disease: Secondary | ICD-10-CM | POA: Diagnosis not present

## 2023-11-22 DIAGNOSIS — N2581 Secondary hyperparathyroidism of renal origin: Secondary | ICD-10-CM | POA: Diagnosis not present

## 2023-11-25 DIAGNOSIS — N2581 Secondary hyperparathyroidism of renal origin: Secondary | ICD-10-CM | POA: Diagnosis not present

## 2023-11-25 DIAGNOSIS — N186 End stage renal disease: Secondary | ICD-10-CM | POA: Diagnosis not present

## 2023-11-25 DIAGNOSIS — Z992 Dependence on renal dialysis: Secondary | ICD-10-CM | POA: Diagnosis not present

## 2023-11-29 DIAGNOSIS — Z992 Dependence on renal dialysis: Secondary | ICD-10-CM | POA: Diagnosis not present

## 2023-11-29 DIAGNOSIS — N186 End stage renal disease: Secondary | ICD-10-CM | POA: Diagnosis not present

## 2023-11-29 DIAGNOSIS — N2581 Secondary hyperparathyroidism of renal origin: Secondary | ICD-10-CM | POA: Diagnosis not present

## 2023-12-02 DIAGNOSIS — N186 End stage renal disease: Secondary | ICD-10-CM | POA: Diagnosis not present

## 2023-12-02 DIAGNOSIS — Z992 Dependence on renal dialysis: Secondary | ICD-10-CM | POA: Diagnosis not present

## 2023-12-02 DIAGNOSIS — N2581 Secondary hyperparathyroidism of renal origin: Secondary | ICD-10-CM | POA: Diagnosis not present

## 2023-12-05 DIAGNOSIS — I129 Hypertensive chronic kidney disease with stage 1 through stage 4 chronic kidney disease, or unspecified chronic kidney disease: Secondary | ICD-10-CM | POA: Diagnosis not present

## 2023-12-05 DIAGNOSIS — Z992 Dependence on renal dialysis: Secondary | ICD-10-CM | POA: Diagnosis not present

## 2023-12-05 DIAGNOSIS — N186 End stage renal disease: Secondary | ICD-10-CM | POA: Diagnosis not present

## 2023-12-06 DIAGNOSIS — E8770 Fluid overload, unspecified: Secondary | ICD-10-CM | POA: Diagnosis not present

## 2023-12-06 DIAGNOSIS — N2581 Secondary hyperparathyroidism of renal origin: Secondary | ICD-10-CM | POA: Diagnosis not present

## 2023-12-06 DIAGNOSIS — N186 End stage renal disease: Secondary | ICD-10-CM | POA: Diagnosis not present

## 2023-12-06 DIAGNOSIS — Z992 Dependence on renal dialysis: Secondary | ICD-10-CM | POA: Diagnosis not present

## 2023-12-08 ENCOUNTER — Other Ambulatory Visit: Payer: Self-pay | Admitting: Gastroenterology

## 2023-12-09 DIAGNOSIS — Z992 Dependence on renal dialysis: Secondary | ICD-10-CM | POA: Diagnosis not present

## 2023-12-09 DIAGNOSIS — E8770 Fluid overload, unspecified: Secondary | ICD-10-CM | POA: Diagnosis not present

## 2023-12-09 DIAGNOSIS — N186 End stage renal disease: Secondary | ICD-10-CM | POA: Diagnosis not present

## 2023-12-09 DIAGNOSIS — N2581 Secondary hyperparathyroidism of renal origin: Secondary | ICD-10-CM | POA: Diagnosis not present

## 2023-12-13 DIAGNOSIS — E8770 Fluid overload, unspecified: Secondary | ICD-10-CM | POA: Diagnosis not present

## 2023-12-13 DIAGNOSIS — N186 End stage renal disease: Secondary | ICD-10-CM | POA: Diagnosis not present

## 2023-12-13 DIAGNOSIS — N2581 Secondary hyperparathyroidism of renal origin: Secondary | ICD-10-CM | POA: Diagnosis not present

## 2023-12-13 DIAGNOSIS — Z992 Dependence on renal dialysis: Secondary | ICD-10-CM | POA: Diagnosis not present

## 2023-12-18 DIAGNOSIS — Z992 Dependence on renal dialysis: Secondary | ICD-10-CM | POA: Diagnosis not present

## 2023-12-18 DIAGNOSIS — E8770 Fluid overload, unspecified: Secondary | ICD-10-CM | POA: Diagnosis not present

## 2023-12-18 DIAGNOSIS — N186 End stage renal disease: Secondary | ICD-10-CM | POA: Diagnosis not present

## 2023-12-18 DIAGNOSIS — N2581 Secondary hyperparathyroidism of renal origin: Secondary | ICD-10-CM | POA: Diagnosis not present

## 2023-12-20 DIAGNOSIS — N2581 Secondary hyperparathyroidism of renal origin: Secondary | ICD-10-CM | POA: Diagnosis not present

## 2023-12-20 DIAGNOSIS — N186 End stage renal disease: Secondary | ICD-10-CM | POA: Diagnosis not present

## 2023-12-20 DIAGNOSIS — Z992 Dependence on renal dialysis: Secondary | ICD-10-CM | POA: Diagnosis not present

## 2023-12-20 DIAGNOSIS — E8770 Fluid overload, unspecified: Secondary | ICD-10-CM | POA: Diagnosis not present

## 2023-12-23 ENCOUNTER — Encounter (HOSPITAL_COMMUNITY): Payer: Self-pay | Admitting: Gastroenterology

## 2023-12-23 DIAGNOSIS — N2581 Secondary hyperparathyroidism of renal origin: Secondary | ICD-10-CM | POA: Diagnosis not present

## 2023-12-23 DIAGNOSIS — E8770 Fluid overload, unspecified: Secondary | ICD-10-CM | POA: Diagnosis not present

## 2023-12-23 DIAGNOSIS — Z992 Dependence on renal dialysis: Secondary | ICD-10-CM | POA: Diagnosis not present

## 2023-12-23 DIAGNOSIS — N186 End stage renal disease: Secondary | ICD-10-CM | POA: Diagnosis not present

## 2023-12-23 NOTE — Progress Notes (Addendum)
Attempted to obtain medical history via telephone, unable to reach at this time. Unable to leave voicemail to return pre surgical testing department's phone call,due to mailbox full.  

## 2023-12-25 DIAGNOSIS — N186 End stage renal disease: Secondary | ICD-10-CM | POA: Diagnosis not present

## 2023-12-25 DIAGNOSIS — Z992 Dependence on renal dialysis: Secondary | ICD-10-CM | POA: Diagnosis not present

## 2023-12-25 DIAGNOSIS — E8770 Fluid overload, unspecified: Secondary | ICD-10-CM | POA: Diagnosis not present

## 2023-12-25 DIAGNOSIS — N2581 Secondary hyperparathyroidism of renal origin: Secondary | ICD-10-CM | POA: Diagnosis not present

## 2023-12-26 DIAGNOSIS — E1122 Type 2 diabetes mellitus with diabetic chronic kidney disease: Secondary | ICD-10-CM | POA: Diagnosis not present

## 2023-12-27 DIAGNOSIS — N186 End stage renal disease: Secondary | ICD-10-CM | POA: Diagnosis not present

## 2023-12-27 DIAGNOSIS — Z992 Dependence on renal dialysis: Secondary | ICD-10-CM | POA: Diagnosis not present

## 2023-12-27 DIAGNOSIS — E8770 Fluid overload, unspecified: Secondary | ICD-10-CM | POA: Diagnosis not present

## 2023-12-27 DIAGNOSIS — N2581 Secondary hyperparathyroidism of renal origin: Secondary | ICD-10-CM | POA: Diagnosis not present

## 2023-12-29 NOTE — H&P (Signed)
 History of Present Illness This is a 68 year old patient, here today positive fit test.  Patient had a positive fit test on 08/07/2023. She has a history of diabetes with neuropathy, HTN, hyperlipidemia, stage 4 CKD, sleep apnea, anemia, anxiety, CHF, COPD, chronic vertigo, chronic constipation, and decreased vision after hemorrhage and surgery for retinopathy. She takes dialysis 2 days a week on Tuesday and Saturday. She states she used to be a 3-day a week dialysis patient is but now she is down to 2 days. She says she receives Lovenox subcu twice a week on Tuesdays and Saturdays at her dialysis treatment.  She has chronic constipation. She says she uses magnesium citrate once a week. She says MiraLAX she does not like.  GI History: Colonoscopy: 12/08/2007. She says to repeat in 10 years.  EGD: None seen in chart.   Alarm Symptoms: Anemia: Hgb 10.4 on 08/13/2023. Unintentional Weight Loss: Denies.  Change in Appetite: Denies.  Change in Bowel Habits: Denies.  Trouble Swallowing: Denies.  Blood in Stool: Denies.  Black Stools: Denies.   Abdominal Pain: Denies.  Diarrhea/Constipation: Chronic constipation. She takes Magnesium Citrate weekly.  N/V: Denies.  Reflux: Denies.  Belching or abdominal bloating: Denies.  NSAID Use: Denies.  Blood Thinners: Denies. She get Heparin subcutaneous. She goes to dailysis every Tuesday and Saturday. Two days a week now.  Major medical events over past 6 months: Denies.  Cardiologist: Little Ishikawa, MD. 2D echo 08/13/2023-  Left ventricular ejection fraction, by estimation, is 60 to 65%.  Family History of Colon Cancer: Denies.  Current Medications Acetaminophen 500 MG Tablet 2 tablets as needed Orally three times a day , Notes to Pharmacist: as needed amLODIPine Besylate 10 MG Tablet TAKE 1 TABLET EVERY DAY (MUST KEEP MD APPOINTMENT) Atorvastatin Calcium 40 MG Tablet TAKE 1 TABLET EVERY DAY Carvedilol 25 MG Tablet TAKE 1 TABLET TWICE  DAILY WITH FOOD Dialyvite 800/Zinc(B Complex-C-Zn-Folic Acid) 0.8 MG Tablet 1 tablet Orally Once a day , Notes to Pharmacist: Listed in [employer] 3 Sensor(Continuous Glucose Sensor) - Miscellaneous Use to check your blood sugar as directed [employer] . Reader Use to check your blood sugar as directed Gabapentin 100 MG Capsule 1 tablet Orally twice a day , Notes to Pharmacist: stopped due to dizziness NovoLOG Mix 70/30(Insulin Aspart Prot & Aspart) (70-30) 100 UNIT/ML Suspension 60 units in the morning and 40 units in the evening twice a day Subcutaneous Sevelamer HCl 400 MG Tablet 2 tablets with meals Orally Three times a day , Notes to Pharmacist: unsure of dosage tiZANidine HCl 2 MG Tablet TAKE 1 TABLET BY MOUTH THREE TIMES DAILY FOR MUSCLE SPASM Torsemide 100 MG Tablet TAKE 1 TABLET EVERY DAY traMADol HCl 50 MG Tablet 2 tablets Orally three times a day as needed  Past Medical History diabetes mellitus- [facility]: Dr. Shawnee Knapp- [facility]. Hypertension- [facility]. Hyperlipidemia. BPPV. Scr colon 3/09 nl. left upper quadrant pain in 2015,? Thoracic radiculopathy (Buccini). Abnormal heart rhythm. Fatty liver. Depression. Vertigo. 2/22 UGIB with anemia. Chronic diastolic heart failure-Dr. Allyson Sabal. Right fibular Fx 2/22. CKD IV: Dr. Glenna Fellows. MOderately severe OSA wtih severe prolonged hypoxia- at risk for OHS ( HST 01/2021 ESS 11 AHI 19/hr; O2 min-75%; 183 mins O2 less than 89%)- PAP titration.  Surgical History trigger finger release, bilateral 2009 hysterectomy, vag. 1993 arthroscopic, knee 2007 wisdom teeth extraction colonoscopy 2009 Left eye: cataract removal and lens implant 06/26/2016 catheter removal from abdomen, placement of something in forearm 10/23 Fistula removal from abdomen  09/2023 cologuard, post 08/2023  Family History Father: deceased 61 yrs, HTN, heart disease, diagnosed with Hypertension Mother: deceased 60 yrs, diabetes, Alzheimers, diagnosed with  Diabetes Sister 1: alive, diabetes Daughter(s): alive Son(s): alive, Diabetes 2 1 son(s) , 1 daughter(s) . No Family History of Colon Cancer, Polyps, kidney or Liver Disease  Social History Tobacco use  cigarettes: Never smoked Tobacco history last updated 11/05/2023 Vaping No EXPOSURE TO PASSIVE SMOKE: no. Alcohol: yes, occasionally, wine. Caffeine: yes, none. Recreational drug use: no. Exercise: minimal but she has a stationary bike. Marital Status: married. Children: 2 kids, 3 grandkids; Wandra Mannan- daughter. EDUCATION: High School, Some College 2.5 years. 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 Seat belt use: yes.  Allergies Oxycodone HCl: nausea - Side Effects Hydrocodone-Acetaminophen: nausea - Side Effects  Vital Signs Wt: 264.0, Wt change: 0.4 lbs, Ht: 64.5, BMI: 44.61, Pulse sitting: 56, BP sitting: 152/76 Lt forearm, Repeat BP: 107/63 t forearm.  Examination GENERAL APPEARANCE:  Well developed, well nourished, no active distress, pleasant, no acute distress .  ABDOMEN  no guarding or rigidity.  PSYCHIATRIC  Alert and oriented x3, mood and affect appear normal.  Assessments 1. Positive fecal immunochemical test - R19.5    Treatment 1. Positive fecal immunochemical test   Colonoscopy (Ordered for 11/05/2023) 12/08/2007 colonoscopy. She says she was a repeat in 10 years. She did not come back for her 10-year repeat. She denies seeing any blood or black stools. I discussed risks and benefits of colonoscopy procedure with patient to include risk of bleeding or colon perforation. Patient expressed understanding and agrees to proceed with procedure.

## 2023-12-30 ENCOUNTER — Ambulatory Visit (HOSPITAL_COMMUNITY): Admitting: Anesthesiology

## 2023-12-30 ENCOUNTER — Encounter (HOSPITAL_COMMUNITY): Payer: Self-pay | Admitting: Gastroenterology

## 2023-12-30 ENCOUNTER — Encounter (HOSPITAL_COMMUNITY)
Admission: RE | Disposition: A | Discharge status: Home / Self Care | Payer: Self-pay | Source: Home / Self Care | Attending: Gastroenterology

## 2023-12-30 ENCOUNTER — Ambulatory Visit (HOSPITAL_BASED_OUTPATIENT_CLINIC_OR_DEPARTMENT_OTHER): Admitting: Anesthesiology

## 2023-12-30 ENCOUNTER — Other Ambulatory Visit: Payer: Self-pay

## 2023-12-30 ENCOUNTER — Ambulatory Visit (HOSPITAL_COMMUNITY)
Admission: RE | Admit: 2023-12-30 | Discharge: 2023-12-30 | Disposition: A | Discharge status: Home / Self Care | Attending: Gastroenterology | Admitting: Gastroenterology

## 2023-12-30 DIAGNOSIS — Z992 Dependence on renal dialysis: Secondary | ICD-10-CM | POA: Diagnosis not present

## 2023-12-30 DIAGNOSIS — Z833 Family history of diabetes mellitus: Secondary | ICD-10-CM | POA: Insufficient documentation

## 2023-12-30 DIAGNOSIS — I5032 Chronic diastolic (congestive) heart failure: Secondary | ICD-10-CM | POA: Insufficient documentation

## 2023-12-30 DIAGNOSIS — K5730 Diverticulosis of large intestine without perforation or abscess without bleeding: Secondary | ICD-10-CM | POA: Insufficient documentation

## 2023-12-30 DIAGNOSIS — J449 Chronic obstructive pulmonary disease, unspecified: Secondary | ICD-10-CM | POA: Diagnosis not present

## 2023-12-30 DIAGNOSIS — E1140 Type 2 diabetes mellitus with diabetic neuropathy, unspecified: Secondary | ICD-10-CM | POA: Insufficient documentation

## 2023-12-30 DIAGNOSIS — I132 Hypertensive heart and chronic kidney disease with heart failure and with stage 5 chronic kidney disease, or end stage renal disease: Secondary | ICD-10-CM | POA: Insufficient documentation

## 2023-12-30 DIAGNOSIS — M1990 Unspecified osteoarthritis, unspecified site: Secondary | ICD-10-CM | POA: Diagnosis not present

## 2023-12-30 DIAGNOSIS — K760 Fatty (change of) liver, not elsewhere classified: Secondary | ICD-10-CM | POA: Diagnosis not present

## 2023-12-30 DIAGNOSIS — Z794 Long term (current) use of insulin: Secondary | ICD-10-CM | POA: Insufficient documentation

## 2023-12-30 DIAGNOSIS — E1122 Type 2 diabetes mellitus with diabetic chronic kidney disease: Secondary | ICD-10-CM | POA: Diagnosis not present

## 2023-12-30 DIAGNOSIS — E669 Obesity, unspecified: Secondary | ICD-10-CM | POA: Insufficient documentation

## 2023-12-30 DIAGNOSIS — R195 Other fecal abnormalities: Secondary | ICD-10-CM

## 2023-12-30 DIAGNOSIS — I130 Hypertensive heart and chronic kidney disease with heart failure and stage 1 through stage 4 chronic kidney disease, or unspecified chronic kidney disease: Secondary | ICD-10-CM | POA: Diagnosis not present

## 2023-12-30 DIAGNOSIS — K648 Other hemorrhoids: Secondary | ICD-10-CM | POA: Diagnosis not present

## 2023-12-30 DIAGNOSIS — N186 End stage renal disease: Secondary | ICD-10-CM | POA: Diagnosis not present

## 2023-12-30 DIAGNOSIS — D631 Anemia in chronic kidney disease: Secondary | ICD-10-CM | POA: Insufficient documentation

## 2023-12-30 DIAGNOSIS — E785 Hyperlipidemia, unspecified: Secondary | ICD-10-CM | POA: Insufficient documentation

## 2023-12-30 DIAGNOSIS — G4733 Obstructive sleep apnea (adult) (pediatric): Secondary | ICD-10-CM | POA: Insufficient documentation

## 2023-12-30 DIAGNOSIS — Z6841 Body Mass Index (BMI) 40.0 and over, adult: Secondary | ICD-10-CM | POA: Insufficient documentation

## 2023-12-30 DIAGNOSIS — D120 Benign neoplasm of cecum: Secondary | ICD-10-CM | POA: Diagnosis not present

## 2023-12-30 DIAGNOSIS — K5909 Other constipation: Secondary | ICD-10-CM | POA: Insufficient documentation

## 2023-12-30 DIAGNOSIS — N184 Chronic kidney disease, stage 4 (severe): Secondary | ICD-10-CM | POA: Diagnosis not present

## 2023-12-30 DIAGNOSIS — Z79899 Other long term (current) drug therapy: Secondary | ICD-10-CM | POA: Insufficient documentation

## 2023-12-30 DIAGNOSIS — G4730 Sleep apnea, unspecified: Secondary | ICD-10-CM | POA: Insufficient documentation

## 2023-12-30 DIAGNOSIS — I5033 Acute on chronic diastolic (congestive) heart failure: Secondary | ICD-10-CM | POA: Diagnosis not present

## 2023-12-30 DIAGNOSIS — K219 Gastro-esophageal reflux disease without esophagitis: Secondary | ICD-10-CM | POA: Insufficient documentation

## 2023-12-30 HISTORY — PX: COLONOSCOPY WITH PROPOFOL: SHX5780

## 2023-12-30 HISTORY — PX: HEMOSTASIS CLIP PLACEMENT: SHX6857

## 2023-12-30 HISTORY — PX: POLYPECTOMY: SHX5525

## 2023-12-30 LAB — POCT I-STAT, CHEM 8
BUN: 39 mg/dL — ABNORMAL HIGH (ref 8–23)
Calcium, Ion: 1.07 mmol/L — ABNORMAL LOW (ref 1.15–1.40)
Chloride: 101 mmol/L (ref 98–111)
Creatinine, Ser: 3.5 mg/dL — ABNORMAL HIGH (ref 0.44–1.00)
Glucose, Bld: 250 mg/dL — ABNORMAL HIGH (ref 70–99)
HCT: 34 % — ABNORMAL LOW (ref 36.0–46.0)
Hemoglobin: 11.6 g/dL — ABNORMAL LOW (ref 12.0–15.0)
Potassium: 3.9 mmol/L (ref 3.5–5.1)
Sodium: 138 mmol/L (ref 135–145)
TCO2: 25 mmol/L (ref 22–32)

## 2023-12-30 SURGERY — COLONOSCOPY WITH PROPOFOL
Anesthesia: Monitor Anesthesia Care

## 2023-12-30 MED ORDER — PROPOFOL 500 MG/50ML IV EMUL
INTRAVENOUS | Status: DC | PRN
  Administered 2023-12-30: 125 ug/kg/min via INTRAVENOUS

## 2023-12-30 MED ORDER — PROPOFOL 10 MG/ML IV BOLUS
INTRAVENOUS | Status: DC | PRN
  Administered 2023-12-30: 50 mg via INTRAVENOUS

## 2023-12-30 MED ORDER — LIDOCAINE 2% (20 MG/ML) 5 ML SYRINGE
INTRAMUSCULAR | Status: DC | PRN
  Administered 2023-12-30: 80 mg via INTRAVENOUS

## 2023-12-30 MED ORDER — GLYCOPYRROLATE 0.2 MG/ML IJ SOLN
INTRAMUSCULAR | Status: DC | PRN
  Administered 2023-12-30: .2 mg via INTRAVENOUS

## 2023-12-30 MED ORDER — SODIUM CHLORIDE 0.9% FLUSH: 3.0000 mL | INTRAVENOUS | Status: DC | PRN

## 2023-12-30 MED ORDER — SODIUM CHLORIDE 0.9% FLUSH
3.0000 mL | Freq: Two times a day (BID) | INTRAVENOUS | Status: DC
Start: 2023-12-30 — End: 2023-12-30

## 2023-12-30 MED ORDER — SODIUM CHLORIDE 0.9 % IV SOLN: INTRAVENOUS | Status: DC | PRN

## 2023-12-30 SURGICAL SUPPLY — 20 items
ELECT REM PT RETURN 9FT ADLT (ELECTROSURGICAL) IMPLANT
ELECTRODE REM PT RTRN 9FT ADLT (ELECTROSURGICAL) IMPLANT
FLOOR PAD 36X40 (MISCELLANEOUS) ×2 IMPLANT
FORCEPS BIOP RAD 4 LRG CAP 4 (CUTTING FORCEPS) IMPLANT
FORCEPS BIOP RJ4 240 W/NDL (CUTTING FORCEPS) IMPLANT
FORCEPS BXJMBJMB 240X2.8X (CUTTING FORCEPS) IMPLANT
INJECTOR/SNARE I SNARE (MISCELLANEOUS) IMPLANT
LUBRICANT JELLY 4.5OZ STERILE (MISCELLANEOUS) IMPLANT
MANIFOLD NEPTUNE II (INSTRUMENTS) IMPLANT
NDL SCLEROTHERAPY 25GX240 (NEEDLE) IMPLANT
NEEDLE SCLEROTHERAPY 25GX240 (NEEDLE) IMPLANT
PAD FLOOR 36X40 (MISCELLANEOUS) ×3 IMPLANT
PROBE APC STR FIRE (PROBE) IMPLANT
PROBE INJECTION GOLD 7FR (MISCELLANEOUS) IMPLANT
SNARE ROTATE MED OVAL 20MM (MISCELLANEOUS) IMPLANT
SYR 50ML LL SCALE MARK (SYRINGE) IMPLANT
TRAP SPECIMEN MUCOUS 40CC (MISCELLANEOUS) IMPLANT
TUBING ENDO SMARTCAP PENTAX (MISCELLANEOUS) IMPLANT
TUBING IRRIGATION ENDOGATOR (MISCELLANEOUS) ×3 IMPLANT
WATER STERILE IRR 1000ML POUR (IV SOLUTION) IMPLANT

## 2023-12-30 NOTE — Op Note (Signed)
 [facility] Patient Name: Latasha [name] Procedure Date: 12/30/2023 MRN: [id number] Attending MD: Kerin Salen , MD, [id number] Date of Birth: May 24, 1956 CSN: [id number] Age: 68 Admit Type: Outpatient Procedure:                Colonoscopy Indications:              Last colonoscopy: 2009, Positive fecal                            immunochemical test Providers:                Kerin Salen, MD, Rogue Jury, RN, Alan Ripper,                            Technician Referring MD:             Lavada Mesi Medicines:                Monitored Anesthesia Care Complications:            No immediate complications. Estimated Blood Loss:     Estimated blood loss was minimal. Procedure:                Pre-Anesthesia Assessment:                           - Prior to the procedure, a History and Physical                            was performed, and patient medications and                            allergies were reviewed. The patient's tolerance of                            previous anesthesia was also reviewed. The risks                            and benefits of the procedure and the sedation                            options and risks were discussed with the patient.                            All questions were answered, and informed consent                            was obtained. Prior Anticoagulants: The patient has                            taken no anticoagulant or antiplatelet agents. ASA                            Grade Assessment: III - A patient with severe                            systemic disease.  After reviewing the risks and                            benefits, the patient was deemed in satisfactory                            condition to undergo the procedure.                           After obtaining informed consent, the colonoscope                            was passed under direct vision. Throughout the                            procedure, the patient's  blood pressure, pulse, and                            oxygen saturations were monitored continuously. The                            PCF-HQ190L ([id number] Olympus colonoscope was                            introduced through the anus and advanced to the the                            terminal ileum. The colonoscopy was performed                            without difficulty. The patient tolerated the                            procedure well. The quality of the bowel                            preparation was fair. The terminal ileum, ileocecal                            valve, appendiceal orifice, and rectum were                            photographed. Scope In: 11:07:16 AM Scope Out: 11:21:48 AM Scope Withdrawal Time: 0 hours 12 minutes 4 seconds  Total Procedure Duration: 0 hours 14 minutes 32 seconds  Findings:      Hemorrhoids were found on perianal exam.      The terminal ileum appeared normal.      A 10 mm polyp was found in the cecum. The polyp was sessile. The polyp       was removed with a cold snare. Resection and retrieval were complete. To       close a defect after polypectomy, one hemostatic clip was successfully       placed (MR conditional). Clip manufacturer: [employer]. There was       no bleeding at the end of the  procedure.      Multiple large-mouthed, medium-mouthed and small-mouthed diverticula       were found in the sigmoid colon, descending colon and transverse colon.      Non-bleeding internal hemorrhoids were found during retroflexion.      A moderate amount of semi-liquid stool was found in the entire colon,       making visualization difficult. Lavage of the area was performed,       resulting in incomplete clearance with fair visualization. Impression:               - Preparation of the colon was fair.                           - Hemorrhoids found on perianal exam.                           - The examined portion of the ileum was normal.                            - One 10 mm polyp in the cecum, removed with a cold                            snare. Resected and retrieved. Clip (MR                            conditional) was placed. Clip manufacturer: [employer].                           - Diverticulosis in the sigmoid colon, in the                            descending colon and in the transverse colon.                           - Non-bleeding internal hemorrhoids.                           - Stool in the entire examined colon. Moderate Sedation:      Patient did not receive moderate sedation for this procedure, but       instead received monitored anesthesia care. Recommendation:           - Patient has a contact number available for                            emergencies. The signs and symptoms of potential                            delayed complications were discussed with the                            patient. Return to normal activities tomorrow.  Written discharge instructions were provided to the                            patient.                           - High fiber diet.                           - Continue present medications.                           - Await pathology results.                           - Repeat colonoscopy for surveillance based on                            pathology results likely with a 2-day prep. Procedure Code(s):        --- Professional ---                           [id number], Colonoscopy, flexible; with removal of                            tumor(s), polyp(s), or other lesion(s) by snare                            technique Diagnosis Code(s):        --- Professional ---                           K64.8, Other hemorrhoids                           D12.0, Benign neoplasm of cecum                           R19.5, Other fecal abnormalities                           K57.30, Diverticulosis of large intestine without                             perforation or abscess without bleeding CPT copyright 2022 [employer]. All rights reserved. The codes documented in this report are preliminary and upon coder review may  be revised to meet current compliance requirements. Kerin Salen, MD 12/30/2023 11:31:10 AM This report has been signed electronically. Number of Addenda: 0

## 2023-12-30 NOTE — Transfer of Care (Signed)
 Immediate Anesthesia Transfer of Care Note  Patient: Latasha [name]  Procedure(s) Performed: COLONOSCOPY WITH PROPOFOL POLYPECTOMY CONTROL OF HEMORRHAGE, GI TRACT, ENDOSCOPIC, BY CLIPPING OR OVERSEWING  Patient Location: PACU  Anesthesia Type:MAC  Level of Consciousness: awake, alert , oriented, and patient cooperative  Airway & Oxygen Therapy: Patient Spontanous Breathing and Patient connected to face mask oxygen  Post-op Assessment: Report given to RN and Post -op Vital signs reviewed and stable  Post vital signs: Reviewed and stable  Last Vitals:  Vitals Value Taken Time  BP    Temp    Pulse    Resp    SpO2      Last Pain:  Vitals:   12/30/23 0923  TempSrc: Temporal  PainSc: 0-No pain         Complications: No notable events documented.

## 2023-12-30 NOTE — Interval H&P Note (Signed)
 History and Physical Interval Note: 67/female with a positive FIT, on hemodialysis for a colonoscopy with propofol.  12/30/2023 9:19 AM  Latasha [name]  has presented today for colonoscopy, with the diagnosis of Positive FIT test.  The various methods of treatment have been discussed with the patient and family. After consideration of risks, benefits and other options for treatment, the patient has consented to  Procedure(s): COLONOSCOPY WITH PROPOFOL (N/A) as a surgical intervention.  The patient's history has been reviewed, patient examined, no change in status, stable for surgery.  I have reviewed the patient's chart and labs.  Questions were answered to the patient's satisfaction.     Kerin Salen

## 2023-12-30 NOTE — Anesthesia Procedure Notes (Signed)
 Procedure Name: MAC Date/Time: 12/30/2023 10:59 AM  Performed by: Ponciano Ort, CRNAPre-anesthesia Checklist: Patient identified, Emergency Drugs available, Suction available, Patient being monitored and Timeout performed Patient Re-evaluated:Patient Re-evaluated prior to induction Oxygen Delivery Method: Simple face mask Placement Confirmation: positive ETCO2

## 2023-12-30 NOTE — Discharge Instructions (Signed)

## 2023-12-30 NOTE — Anesthesia Preprocedure Evaluation (Addendum)
 Anesthesia Evaluation  Patient identified by MRN, date of birth, ID band Patient awake    Reviewed: Allergy & Precautions, NPO status , Patient's Chart, lab work & pertinent test results, reviewed documented beta blocker date and time   History of Anesthesia Complications Negative for: history of anesthetic complications  Airway Mallampati: II  TM Distance: >3 FB Neck ROM: Full    Dental  (+) Partial Upper, Partial Lower   Pulmonary sleep apnea    Pulmonary exam normal        Cardiovascular hypertension, Pt. on medications and Pt. on home beta blockers pulmonary hypertensionNormal cardiovascular exam   '24 TTE - EF 60 to 65%. There is moderate left ventricular hypertrophy. Grade II diastolic dysfunction (pseudonormalization). The right ventricular size is moderately enlarged. There is moderately elevated pulmonary artery systolic pressure. Left atrial size was severely dilated. Right atrial size was severely dilated. Trivial MR     Neuro/Psych  BPPV   negative psych ROS   GI/Hepatic Neg liver ROS,GERD  Controlled,,  Endo/Other  diabetes, Poorly Controlled, Type 2    Renal/GU ESRF and DialysisRenal disease     Musculoskeletal  (+) Arthritis ,    Abdominal  (+) + obese  Peds  Hematology  (+) Blood dyscrasia, anemia   Anesthesia Other Findings   Reproductive/Obstetrics                             Anesthesia Physical Anesthesia Plan  ASA: 3  Anesthesia Plan: MAC   Post-op Pain Management: Minimal or no pain anticipated   Induction:   PONV Risk Score and Plan: 2 and Propofol infusion and Treatment may vary due to age or medical condition  Airway Management Planned: Natural Airway and Simple Face Mask  Additional Equipment: None  Intra-op Plan:   Post-operative Plan:   Informed Consent: I have reviewed the patients History and Physical, chart, labs and discussed the procedure  including the risks, benefits and alternatives for the proposed anesthesia with the patient or authorized representative who has indicated his/her understanding and acceptance.       Plan Discussed with: CRNA and Anesthesiologist  Anesthesia Plan Comments:         Anesthesia Quick Evaluation

## 2023-12-30 NOTE — Anesthesia Postprocedure Evaluation (Signed)
 Anesthesia Post Note  Patient: Latasha [name]  Procedure(s) Performed: COLONOSCOPY WITH PROPOFOL POLYPECTOMY CONTROL OF HEMORRHAGE, GI TRACT, ENDOSCOPIC, BY CLIPPING OR OVERSEWING     Patient location during evaluation: PACU Anesthesia Type: MAC Level of consciousness: awake and alert Pain management: pain level controlled Vital Signs Assessment: post-procedure vital signs reviewed and stable Respiratory status: spontaneous breathing, nonlabored ventilation and respiratory function stable Cardiovascular status: stable and blood pressure returned to baseline Anesthetic complications: no  No notable events documented.  Last Vitals:  Vitals:   12/30/23 1140 12/30/23 1150  BP: (!) 188/63 (!) 196/69  Pulse: 64 65  Resp: (!) 21 18  Temp:    SpO2: 93% 93%    Last Pain:  Vitals:   12/30/23 1150  TempSrc:   PainSc: 0-No pain                 Beryle Lathe

## 2023-12-31 LAB — SURGICAL PATHOLOGY

## 2024-01-01 ENCOUNTER — Encounter (HOSPITAL_COMMUNITY): Payer: Self-pay | Admitting: Gastroenterology

## 2024-01-01 DIAGNOSIS — Z992 Dependence on renal dialysis: Secondary | ICD-10-CM | POA: Diagnosis not present

## 2024-01-01 DIAGNOSIS — N2581 Secondary hyperparathyroidism of renal origin: Secondary | ICD-10-CM | POA: Diagnosis not present

## 2024-01-01 DIAGNOSIS — N186 End stage renal disease: Secondary | ICD-10-CM | POA: Diagnosis not present

## 2024-01-01 DIAGNOSIS — E8770 Fluid overload, unspecified: Secondary | ICD-10-CM | POA: Diagnosis not present

## 2024-01-03 DIAGNOSIS — E8770 Fluid overload, unspecified: Secondary | ICD-10-CM | POA: Diagnosis not present

## 2024-01-03 DIAGNOSIS — Z992 Dependence on renal dialysis: Secondary | ICD-10-CM | POA: Diagnosis not present

## 2024-01-03 DIAGNOSIS — N186 End stage renal disease: Secondary | ICD-10-CM | POA: Diagnosis not present

## 2024-01-03 DIAGNOSIS — N2581 Secondary hyperparathyroidism of renal origin: Secondary | ICD-10-CM | POA: Diagnosis not present

## 2024-01-05 DIAGNOSIS — I129 Hypertensive chronic kidney disease with stage 1 through stage 4 chronic kidney disease, or unspecified chronic kidney disease: Secondary | ICD-10-CM | POA: Diagnosis not present

## 2024-01-05 DIAGNOSIS — N186 End stage renal disease: Secondary | ICD-10-CM | POA: Diagnosis not present

## 2024-01-05 DIAGNOSIS — Z992 Dependence on renal dialysis: Secondary | ICD-10-CM | POA: Diagnosis not present

## 2024-01-06 DIAGNOSIS — N2581 Secondary hyperparathyroidism of renal origin: Secondary | ICD-10-CM | POA: Diagnosis not present

## 2024-01-06 DIAGNOSIS — Z992 Dependence on renal dialysis: Secondary | ICD-10-CM | POA: Diagnosis not present

## 2024-01-06 DIAGNOSIS — N186 End stage renal disease: Secondary | ICD-10-CM | POA: Diagnosis not present

## 2024-01-10 DIAGNOSIS — N186 End stage renal disease: Secondary | ICD-10-CM | POA: Diagnosis not present

## 2024-01-10 DIAGNOSIS — N2581 Secondary hyperparathyroidism of renal origin: Secondary | ICD-10-CM | POA: Diagnosis not present

## 2024-01-10 DIAGNOSIS — Z992 Dependence on renal dialysis: Secondary | ICD-10-CM | POA: Diagnosis not present

## 2024-01-14 DIAGNOSIS — N186 End stage renal disease: Secondary | ICD-10-CM | POA: Diagnosis not present

## 2024-01-14 DIAGNOSIS — N2581 Secondary hyperparathyroidism of renal origin: Secondary | ICD-10-CM | POA: Diagnosis not present

## 2024-01-14 DIAGNOSIS — Z992 Dependence on renal dialysis: Secondary | ICD-10-CM | POA: Diagnosis not present

## 2024-01-20 DIAGNOSIS — N186 End stage renal disease: Secondary | ICD-10-CM | POA: Diagnosis not present

## 2024-01-20 DIAGNOSIS — N2581 Secondary hyperparathyroidism of renal origin: Secondary | ICD-10-CM | POA: Diagnosis not present

## 2024-01-20 DIAGNOSIS — Z992 Dependence on renal dialysis: Secondary | ICD-10-CM | POA: Diagnosis not present

## 2024-01-24 DIAGNOSIS — N186 End stage renal disease: Secondary | ICD-10-CM | POA: Diagnosis not present

## 2024-01-24 DIAGNOSIS — Z992 Dependence on renal dialysis: Secondary | ICD-10-CM | POA: Diagnosis not present

## 2024-01-24 DIAGNOSIS — N2581 Secondary hyperparathyroidism of renal origin: Secondary | ICD-10-CM | POA: Diagnosis not present

## 2024-01-27 DIAGNOSIS — Z992 Dependence on renal dialysis: Secondary | ICD-10-CM | POA: Diagnosis not present

## 2024-01-27 DIAGNOSIS — N186 End stage renal disease: Secondary | ICD-10-CM | POA: Diagnosis not present

## 2024-01-27 DIAGNOSIS — N2581 Secondary hyperparathyroidism of renal origin: Secondary | ICD-10-CM | POA: Diagnosis not present

## 2024-01-31 DIAGNOSIS — Z992 Dependence on renal dialysis: Secondary | ICD-10-CM | POA: Diagnosis not present

## 2024-01-31 DIAGNOSIS — N186 End stage renal disease: Secondary | ICD-10-CM | POA: Diagnosis not present

## 2024-01-31 DIAGNOSIS — N2581 Secondary hyperparathyroidism of renal origin: Secondary | ICD-10-CM | POA: Diagnosis not present

## 2024-02-03 DIAGNOSIS — N186 End stage renal disease: Secondary | ICD-10-CM | POA: Diagnosis not present

## 2024-02-03 DIAGNOSIS — N2581 Secondary hyperparathyroidism of renal origin: Secondary | ICD-10-CM | POA: Diagnosis not present

## 2024-02-03 DIAGNOSIS — Z992 Dependence on renal dialysis: Secondary | ICD-10-CM | POA: Diagnosis not present

## 2024-02-04 DIAGNOSIS — N186 End stage renal disease: Secondary | ICD-10-CM | POA: Diagnosis not present

## 2024-02-04 DIAGNOSIS — I129 Hypertensive chronic kidney disease with stage 1 through stage 4 chronic kidney disease, or unspecified chronic kidney disease: Secondary | ICD-10-CM | POA: Diagnosis not present

## 2024-02-04 DIAGNOSIS — Z992 Dependence on renal dialysis: Secondary | ICD-10-CM | POA: Diagnosis not present

## 2024-02-07 DIAGNOSIS — N186 End stage renal disease: Secondary | ICD-10-CM | POA: Diagnosis not present

## 2024-02-07 DIAGNOSIS — N2581 Secondary hyperparathyroidism of renal origin: Secondary | ICD-10-CM | POA: Diagnosis not present

## 2024-02-07 DIAGNOSIS — Z992 Dependence on renal dialysis: Secondary | ICD-10-CM | POA: Diagnosis not present

## 2024-02-10 DIAGNOSIS — N186 End stage renal disease: Secondary | ICD-10-CM | POA: Diagnosis not present

## 2024-02-10 DIAGNOSIS — N2581 Secondary hyperparathyroidism of renal origin: Secondary | ICD-10-CM | POA: Diagnosis not present

## 2024-02-10 DIAGNOSIS — Z992 Dependence on renal dialysis: Secondary | ICD-10-CM | POA: Diagnosis not present

## 2024-02-14 DIAGNOSIS — N2581 Secondary hyperparathyroidism of renal origin: Secondary | ICD-10-CM | POA: Diagnosis not present

## 2024-02-14 DIAGNOSIS — N186 End stage renal disease: Secondary | ICD-10-CM | POA: Diagnosis not present

## 2024-02-14 DIAGNOSIS — Z992 Dependence on renal dialysis: Secondary | ICD-10-CM | POA: Diagnosis not present

## 2024-02-17 DIAGNOSIS — N2581 Secondary hyperparathyroidism of renal origin: Secondary | ICD-10-CM | POA: Diagnosis not present

## 2024-02-17 DIAGNOSIS — Z992 Dependence on renal dialysis: Secondary | ICD-10-CM | POA: Diagnosis not present

## 2024-02-17 DIAGNOSIS — N186 End stage renal disease: Secondary | ICD-10-CM | POA: Diagnosis not present

## 2024-02-24 DIAGNOSIS — N186 End stage renal disease: Secondary | ICD-10-CM | POA: Diagnosis not present

## 2024-02-24 DIAGNOSIS — N2581 Secondary hyperparathyroidism of renal origin: Secondary | ICD-10-CM | POA: Diagnosis not present

## 2024-02-24 DIAGNOSIS — Z992 Dependence on renal dialysis: Secondary | ICD-10-CM | POA: Diagnosis not present

## 2024-02-28 DIAGNOSIS — N186 End stage renal disease: Secondary | ICD-10-CM | POA: Diagnosis not present

## 2024-02-28 DIAGNOSIS — N2581 Secondary hyperparathyroidism of renal origin: Secondary | ICD-10-CM | POA: Diagnosis not present

## 2024-02-28 DIAGNOSIS — Z992 Dependence on renal dialysis: Secondary | ICD-10-CM | POA: Diagnosis not present

## 2024-03-03 DIAGNOSIS — J449 Chronic obstructive pulmonary disease, unspecified: Secondary | ICD-10-CM | POA: Diagnosis not present

## 2024-03-03 DIAGNOSIS — N186 End stage renal disease: Secondary | ICD-10-CM | POA: Diagnosis not present

## 2024-03-03 DIAGNOSIS — N2581 Secondary hyperparathyroidism of renal origin: Secondary | ICD-10-CM | POA: Diagnosis not present

## 2024-03-03 DIAGNOSIS — E1140 Type 2 diabetes mellitus with diabetic neuropathy, unspecified: Secondary | ICD-10-CM | POA: Diagnosis not present

## 2024-03-03 DIAGNOSIS — Z992 Dependence on renal dialysis: Secondary | ICD-10-CM | POA: Diagnosis not present

## 2024-03-03 DIAGNOSIS — I5032 Chronic diastolic (congestive) heart failure: Secondary | ICD-10-CM | POA: Diagnosis not present

## 2024-03-04 DIAGNOSIS — Z978 Presence of other specified devices: Secondary | ICD-10-CM | POA: Diagnosis not present

## 2024-03-04 DIAGNOSIS — E1122 Type 2 diabetes mellitus with diabetic chronic kidney disease: Secondary | ICD-10-CM | POA: Diagnosis not present

## 2024-03-04 DIAGNOSIS — J449 Chronic obstructive pulmonary disease, unspecified: Secondary | ICD-10-CM | POA: Diagnosis not present

## 2024-03-04 DIAGNOSIS — I5032 Chronic diastolic (congestive) heart failure: Secondary | ICD-10-CM | POA: Diagnosis not present

## 2024-03-04 DIAGNOSIS — Z992 Dependence on renal dialysis: Secondary | ICD-10-CM | POA: Diagnosis not present

## 2024-03-04 DIAGNOSIS — Z794 Long term (current) use of insulin: Secondary | ICD-10-CM | POA: Diagnosis not present

## 2024-03-04 DIAGNOSIS — Z833 Family history of diabetes mellitus: Secondary | ICD-10-CM | POA: Diagnosis not present

## 2024-03-04 DIAGNOSIS — Z7985 Long-term (current) use of injectable non-insulin antidiabetic drugs: Secondary | ICD-10-CM | POA: Diagnosis not present

## 2024-03-04 DIAGNOSIS — E1140 Type 2 diabetes mellitus with diabetic neuropathy, unspecified: Secondary | ICD-10-CM | POA: Diagnosis not present

## 2024-03-04 DIAGNOSIS — N186 End stage renal disease: Secondary | ICD-10-CM | POA: Diagnosis not present

## 2024-03-06 DIAGNOSIS — I129 Hypertensive chronic kidney disease with stage 1 through stage 4 chronic kidney disease, or unspecified chronic kidney disease: Secondary | ICD-10-CM | POA: Diagnosis not present

## 2024-03-06 DIAGNOSIS — N2581 Secondary hyperparathyroidism of renal origin: Secondary | ICD-10-CM | POA: Diagnosis not present

## 2024-03-06 DIAGNOSIS — Z992 Dependence on renal dialysis: Secondary | ICD-10-CM | POA: Diagnosis not present

## 2024-03-06 DIAGNOSIS — N186 End stage renal disease: Secondary | ICD-10-CM | POA: Diagnosis not present

## 2024-03-11 DIAGNOSIS — Z992 Dependence on renal dialysis: Secondary | ICD-10-CM | POA: Diagnosis not present

## 2024-03-11 DIAGNOSIS — N186 End stage renal disease: Secondary | ICD-10-CM | POA: Diagnosis not present

## 2024-03-11 DIAGNOSIS — N2581 Secondary hyperparathyroidism of renal origin: Secondary | ICD-10-CM | POA: Diagnosis not present

## 2024-03-13 DIAGNOSIS — Z992 Dependence on renal dialysis: Secondary | ICD-10-CM | POA: Diagnosis not present

## 2024-03-13 DIAGNOSIS — N186 End stage renal disease: Secondary | ICD-10-CM | POA: Diagnosis not present

## 2024-03-13 DIAGNOSIS — N2581 Secondary hyperparathyroidism of renal origin: Secondary | ICD-10-CM | POA: Diagnosis not present

## 2024-03-16 DIAGNOSIS — Z992 Dependence on renal dialysis: Secondary | ICD-10-CM | POA: Diagnosis not present

## 2024-03-16 DIAGNOSIS — N2581 Secondary hyperparathyroidism of renal origin: Secondary | ICD-10-CM | POA: Diagnosis not present

## 2024-03-16 DIAGNOSIS — N186 End stage renal disease: Secondary | ICD-10-CM | POA: Diagnosis not present

## 2024-03-18 DIAGNOSIS — N186 End stage renal disease: Secondary | ICD-10-CM | POA: Diagnosis not present

## 2024-03-18 DIAGNOSIS — Z992 Dependence on renal dialysis: Secondary | ICD-10-CM | POA: Diagnosis not present

## 2024-03-18 DIAGNOSIS — N2581 Secondary hyperparathyroidism of renal origin: Secondary | ICD-10-CM | POA: Diagnosis not present

## 2024-03-23 DIAGNOSIS — N186 End stage renal disease: Secondary | ICD-10-CM | POA: Diagnosis not present

## 2024-03-23 DIAGNOSIS — Z992 Dependence on renal dialysis: Secondary | ICD-10-CM | POA: Diagnosis not present

## 2024-03-23 DIAGNOSIS — N2581 Secondary hyperparathyroidism of renal origin: Secondary | ICD-10-CM | POA: Diagnosis not present

## 2024-03-25 DIAGNOSIS — E1122 Type 2 diabetes mellitus with diabetic chronic kidney disease: Secondary | ICD-10-CM | POA: Diagnosis not present

## 2024-03-27 DIAGNOSIS — N186 End stage renal disease: Secondary | ICD-10-CM | POA: Diagnosis not present

## 2024-03-27 DIAGNOSIS — N2581 Secondary hyperparathyroidism of renal origin: Secondary | ICD-10-CM | POA: Diagnosis not present

## 2024-03-27 DIAGNOSIS — Z992 Dependence on renal dialysis: Secondary | ICD-10-CM | POA: Diagnosis not present

## 2024-03-30 DIAGNOSIS — N2581 Secondary hyperparathyroidism of renal origin: Secondary | ICD-10-CM | POA: Diagnosis not present

## 2024-03-30 DIAGNOSIS — N186 End stage renal disease: Secondary | ICD-10-CM | POA: Diagnosis not present

## 2024-03-30 DIAGNOSIS — Z992 Dependence on renal dialysis: Secondary | ICD-10-CM | POA: Diagnosis not present

## 2024-04-01 DIAGNOSIS — N186 End stage renal disease: Secondary | ICD-10-CM | POA: Diagnosis not present

## 2024-04-01 DIAGNOSIS — I5032 Chronic diastolic (congestive) heart failure: Secondary | ICD-10-CM | POA: Diagnosis not present

## 2024-04-01 DIAGNOSIS — J449 Chronic obstructive pulmonary disease, unspecified: Secondary | ICD-10-CM | POA: Diagnosis not present

## 2024-04-01 DIAGNOSIS — E1140 Type 2 diabetes mellitus with diabetic neuropathy, unspecified: Secondary | ICD-10-CM | POA: Diagnosis not present

## 2024-04-03 DIAGNOSIS — N186 End stage renal disease: Secondary | ICD-10-CM | POA: Diagnosis not present

## 2024-04-03 DIAGNOSIS — N2581 Secondary hyperparathyroidism of renal origin: Secondary | ICD-10-CM | POA: Diagnosis not present

## 2024-04-03 DIAGNOSIS — Z992 Dependence on renal dialysis: Secondary | ICD-10-CM | POA: Diagnosis not present

## 2024-04-05 DIAGNOSIS — J449 Chronic obstructive pulmonary disease, unspecified: Secondary | ICD-10-CM | POA: Diagnosis not present

## 2024-04-05 DIAGNOSIS — I5032 Chronic diastolic (congestive) heart failure: Secondary | ICD-10-CM | POA: Diagnosis not present

## 2024-04-05 DIAGNOSIS — N186 End stage renal disease: Secondary | ICD-10-CM | POA: Diagnosis not present

## 2024-04-05 DIAGNOSIS — Z992 Dependence on renal dialysis: Secondary | ICD-10-CM | POA: Diagnosis not present

## 2024-04-05 DIAGNOSIS — E7800 Pure hypercholesterolemia, unspecified: Secondary | ICD-10-CM | POA: Diagnosis not present

## 2024-04-05 DIAGNOSIS — E1140 Type 2 diabetes mellitus with diabetic neuropathy, unspecified: Secondary | ICD-10-CM | POA: Diagnosis not present

## 2024-04-05 DIAGNOSIS — I129 Hypertensive chronic kidney disease with stage 1 through stage 4 chronic kidney disease, or unspecified chronic kidney disease: Secondary | ICD-10-CM | POA: Diagnosis not present

## 2024-04-05 DIAGNOSIS — E1165 Type 2 diabetes mellitus with hyperglycemia: Secondary | ICD-10-CM | POA: Diagnosis not present
